# Patient Record
Sex: Female | Born: 1965 | Race: Black or African American | Hispanic: No | Marital: Single | Smoking: Never smoker
Health system: Southern US, Community
[De-identification: ages and names within clinical notes are randomized; demographics above are authoritative.]

## PROBLEM LIST (undated history)

## (undated) DIAGNOSIS — J45909 Unspecified asthma, uncomplicated: Secondary | ICD-10-CM

## (undated) DIAGNOSIS — E785 Hyperlipidemia, unspecified: Secondary | ICD-10-CM

## (undated) DIAGNOSIS — M2550 Pain in unspecified joint: Secondary | ICD-10-CM

## (undated) DIAGNOSIS — F319 Bipolar disorder, unspecified: Secondary | ICD-10-CM

## (undated) DIAGNOSIS — I10 Essential (primary) hypertension: Secondary | ICD-10-CM

## (undated) DIAGNOSIS — R7303 Prediabetes: Secondary | ICD-10-CM

## (undated) DIAGNOSIS — I509 Heart failure, unspecified: Secondary | ICD-10-CM

## (undated) DIAGNOSIS — R600 Localized edema: Secondary | ICD-10-CM

## (undated) DIAGNOSIS — I5032 Chronic diastolic (congestive) heart failure: Secondary | ICD-10-CM

## (undated) DIAGNOSIS — F329 Major depressive disorder, single episode, unspecified: Secondary | ICD-10-CM

## (undated) DIAGNOSIS — M1990 Unspecified osteoarthritis, unspecified site: Secondary | ICD-10-CM

## (undated) DIAGNOSIS — F419 Anxiety disorder, unspecified: Secondary | ICD-10-CM

## (undated) DIAGNOSIS — F32A Depression, unspecified: Secondary | ICD-10-CM

## (undated) HISTORY — DX: Essential (primary) hypertension: I10

## (undated) HISTORY — DX: Unspecified asthma, uncomplicated: J45.909

## (undated) HISTORY — DX: Heart failure, unspecified: I50.9

## (undated) HISTORY — DX: Hyperlipidemia, unspecified: E78.5

## (undated) HISTORY — DX: Bipolar disorder, unspecified: F31.9

## (undated) HISTORY — PX: NO PAST SURGERIES: SHX2092

## (undated) HISTORY — DX: Localized edema: R60.0

## (undated) HISTORY — DX: Morbid (severe) obesity due to excess calories: E66.01

## (undated) HISTORY — DX: Pain in unspecified joint: M25.50

## (undated) HISTORY — DX: Depression, unspecified: F32.A

## (undated) HISTORY — DX: Chronic diastolic (congestive) heart failure: I50.32

## (undated) HISTORY — DX: Anxiety disorder, unspecified: F41.9

---
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
## 2002-02-11 ENCOUNTER — Encounter: Admission: RE | Admit: 2002-02-11 | Discharge: 2002-02-11 | Payer: Self-pay | Admitting: Internal Medicine

## 2002-02-19 ENCOUNTER — Encounter: Admission: RE | Admit: 2002-02-19 | Discharge: 2002-05-20 | Payer: Self-pay | Admitting: *Deleted

## 2003-06-15 ENCOUNTER — Emergency Department (HOSPITAL_COMMUNITY): Admission: EM | Admit: 2003-06-15 | Discharge: 2003-06-15 | Payer: Self-pay | Admitting: Emergency Medicine

## 2004-08-02 ENCOUNTER — Ambulatory Visit: Payer: Self-pay | Admitting: Internal Medicine

## 2004-08-04 ENCOUNTER — Ambulatory Visit: Payer: Self-pay | Admitting: Family Medicine

## 2004-09-21 ENCOUNTER — Ambulatory Visit: Payer: Self-pay | Admitting: Family Medicine

## 2004-10-06 ENCOUNTER — Ambulatory Visit: Payer: Self-pay | Admitting: Family Medicine

## 2004-10-25 ENCOUNTER — Ambulatory Visit: Payer: Self-pay | Admitting: Family Medicine

## 2004-11-03 ENCOUNTER — Ambulatory Visit: Payer: Self-pay | Admitting: Family Medicine

## 2004-11-10 ENCOUNTER — Ambulatory Visit: Payer: Self-pay | Admitting: Family Medicine

## 2004-11-24 ENCOUNTER — Ambulatory Visit: Payer: Self-pay | Admitting: Family Medicine

## 2004-11-30 ENCOUNTER — Ambulatory Visit: Payer: Self-pay | Admitting: Psychiatry

## 2004-11-30 ENCOUNTER — Inpatient Hospital Stay (HOSPITAL_COMMUNITY): Admission: RE | Admit: 2004-11-30 | Discharge: 2004-12-04 | Payer: Self-pay | Admitting: Psychiatry

## 2004-12-08 ENCOUNTER — Ambulatory Visit: Payer: Self-pay | Admitting: Family Medicine

## 2005-02-14 ENCOUNTER — Ambulatory Visit: Payer: Self-pay | Admitting: Family Medicine

## 2005-04-27 ENCOUNTER — Ambulatory Visit: Payer: Self-pay | Admitting: Family Medicine

## 2005-04-27 ENCOUNTER — Ambulatory Visit (HOSPITAL_COMMUNITY): Admission: RE | Admit: 2005-04-27 | Discharge: 2005-04-27 | Payer: Self-pay | Admitting: Family Medicine

## 2005-05-08 ENCOUNTER — Ambulatory Visit: Payer: Self-pay | Admitting: *Deleted

## 2005-05-18 ENCOUNTER — Ambulatory Visit: Payer: Self-pay | Admitting: Family Medicine

## 2005-07-03 ENCOUNTER — Ambulatory Visit: Payer: Self-pay | Admitting: Family Medicine

## 2006-04-21 ENCOUNTER — Emergency Department (HOSPITAL_COMMUNITY): Admission: EM | Admit: 2006-04-21 | Discharge: 2006-04-21 | Payer: Self-pay | Admitting: Emergency Medicine

## 2006-10-25 ENCOUNTER — Emergency Department (HOSPITAL_COMMUNITY): Admission: EM | Admit: 2006-10-25 | Discharge: 2006-10-25 | Payer: Self-pay | Admitting: Family Medicine

## 2006-10-29 ENCOUNTER — Emergency Department (HOSPITAL_COMMUNITY): Admission: EM | Admit: 2006-10-29 | Discharge: 2006-10-29 | Payer: Self-pay | Admitting: Family Medicine

## 2008-03-05 ENCOUNTER — Encounter (INDEPENDENT_AMBULATORY_CARE_PROVIDER_SITE_OTHER): Payer: Self-pay | Admitting: Family Medicine

## 2010-09-05 NOTE — Letter (Signed)
Summary: MAILED REQUESTED ECORDS Christina Dominguez,  MAILED REQUESTED ECORDS [facility],[place]   Imported By: Arta Bruce 03/05/2008 11:25:26  _____________________________________________________________________  External Attachment:    Type:   Image     Comment:   External Document

## 2019-04-30 DIAGNOSIS — J4520 Mild intermittent asthma, uncomplicated: Secondary | ICD-10-CM | POA: Insufficient documentation

## 2019-04-30 DIAGNOSIS — I10 Essential (primary) hypertension: Secondary | ICD-10-CM | POA: Insufficient documentation

## 2019-10-22 ENCOUNTER — Other Ambulatory Visit: Payer: Self-pay | Admitting: Family Medicine

## 2019-10-22 DIAGNOSIS — Z1231 Encounter for screening mammogram for malignant neoplasm of breast: Secondary | ICD-10-CM

## 2019-11-10 ENCOUNTER — Inpatient Hospital Stay: Admission: RE | Admit: 2019-11-10 | Payer: Self-pay | Source: Ambulatory Visit

## 2019-12-02 ENCOUNTER — Other Ambulatory Visit: Payer: Self-pay

## 2019-12-02 ENCOUNTER — Ambulatory Visit
Admission: RE | Admit: 2019-12-02 | Discharge: 2019-12-02 | Disposition: A | Discharge status: Home / Self Care | Payer: Self-pay | Source: Ambulatory Visit | Attending: Family Medicine | Admitting: Family Medicine

## 2019-12-02 DIAGNOSIS — Z1231 Encounter for screening mammogram for malignant neoplasm of breast: Secondary | ICD-10-CM

## 2019-12-04 ENCOUNTER — Other Ambulatory Visit: Payer: Self-pay | Admitting: Family Medicine

## 2019-12-04 DIAGNOSIS — R928 Other abnormal and inconclusive findings on diagnostic imaging of breast: Secondary | ICD-10-CM

## 2019-12-15 ENCOUNTER — Other Ambulatory Visit: Payer: Self-pay

## 2019-12-15 ENCOUNTER — Ambulatory Visit
Admission: RE | Admit: 2019-12-15 | Discharge: 2019-12-15 | Disposition: A | Discharge status: Home / Self Care | Payer: 59 | Source: Ambulatory Visit | Attending: Family Medicine | Admitting: Family Medicine

## 2019-12-15 ENCOUNTER — Other Ambulatory Visit: Payer: Self-pay | Admitting: Family Medicine

## 2019-12-15 DIAGNOSIS — R928 Other abnormal and inconclusive findings on diagnostic imaging of breast: Secondary | ICD-10-CM

## 2019-12-21 ENCOUNTER — Encounter (INDEPENDENT_AMBULATORY_CARE_PROVIDER_SITE_OTHER): Payer: Self-pay | Admitting: Bariatrics

## 2019-12-21 ENCOUNTER — Ambulatory Visit (INDEPENDENT_AMBULATORY_CARE_PROVIDER_SITE_OTHER): Payer: 59 | Admitting: Bariatrics

## 2019-12-21 ENCOUNTER — Other Ambulatory Visit: Payer: Self-pay

## 2019-12-21 VITALS — BP 146/81 | HR 85 | Temp 98.1°F | Ht 61.0 in | Wt 377.0 lb

## 2019-12-21 DIAGNOSIS — Z9189 Other specified personal risk factors, not elsewhere classified: Secondary | ICD-10-CM | POA: Diagnosis not present

## 2019-12-21 DIAGNOSIS — Z1331 Encounter for screening for depression: Secondary | ICD-10-CM

## 2019-12-21 DIAGNOSIS — M2550 Pain in unspecified joint: Secondary | ICD-10-CM

## 2019-12-21 DIAGNOSIS — E66813 Obesity, class 3: Secondary | ICD-10-CM

## 2019-12-21 DIAGNOSIS — I509 Heart failure, unspecified: Secondary | ICD-10-CM

## 2019-12-21 DIAGNOSIS — Z6841 Body Mass Index (BMI) 40.0 and over, adult: Secondary | ICD-10-CM

## 2019-12-21 DIAGNOSIS — J4520 Mild intermittent asthma, uncomplicated: Secondary | ICD-10-CM | POA: Diagnosis not present

## 2019-12-21 DIAGNOSIS — R0602 Shortness of breath: Secondary | ICD-10-CM | POA: Diagnosis not present

## 2019-12-21 DIAGNOSIS — R5383 Other fatigue: Secondary | ICD-10-CM

## 2019-12-21 DIAGNOSIS — I10 Essential (primary) hypertension: Secondary | ICD-10-CM

## 2019-12-21 DIAGNOSIS — F3289 Other specified depressive episodes: Secondary | ICD-10-CM | POA: Diagnosis not present

## 2019-12-21 DIAGNOSIS — Z0289 Encounter for other administrative examinations: Secondary | ICD-10-CM

## 2019-12-21 HISTORY — DX: Body Mass Index (BMI) 40.0 and over, adult: Z684

## 2019-12-21 HISTORY — DX: Morbid (severe) obesity due to excess calories: E66.01

## 2019-12-21 HISTORY — DX: Obesity, class 3: E66.813
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
## 2019-12-21 NOTE — Progress Notes (Signed)
Dear Dr. Crissie Sickles,   Thank you for referring Christina Dominguez to our clinic. The following note includes my evaluation and treatment recommendations.  Chief Complaint:   Christina Dominguez (MR# [id number]) is a 54 y.o. female who presents for evaluation and treatment of obesity and related comorbidities. Current BMI is Body mass index is 71.23 kg/m.Marland Kitchen Christina Dominguez has been struggling with her weight for many years and has been unsuccessful in either losing weight, maintaining weight loss, or reaching her healthy weight goal.  Christina Dominguez is currently in the action stage of change and ready to dedicate time achieving and maintaining a healthier weight. Christina Dominguez is interested in becoming our patient and working on intensive lifestyle modifications including (but not limited to) diet and exercise for weight loss.  Christina Dominguez does like to cook, but notes time and work as obstacles. She craves salty and seafood and more at night. She skips breakfast.  Christina Dominguez's habits were reviewed today and are as follows: she has been heavy most of her life, her heaviest weight ever was 390 pounds, she craves salty and seafood, she wakes up frequently in the middle of the night to eat, she skips breakfast frequently, she has problems with excessive hunger, she frequently eats larger portions than normal, she has binge eating behaviors and she struggles with emotional eating.  Depression Screen Christina Dominguez's Food and Mood (modified PHQ-9) score was 16.  Depression screen [facility] 2/9 12/21/2019  Decreased Interest 3  Down, Depressed, Hopeless 0  PHQ - 2 Score 3  Altered sleeping 3  Tired, decreased energy 3  Change in appetite 3  Feeling bad or failure about yourself  0  Trouble concentrating 1  Moving slowly or fidgety/restless 3  Suicidal thoughts 0  PHQ-9 Score 16  Difficult doing work/chores Somewhat difficult   Subjective:   Other fatigue. Angellynn admits to daytime somnolence and admits to waking up  still tired. Patent has a history of symptoms of daytime fatigue. Miia generally gets 6 hours of sleep per night, and states that she does not sleep well most nights. Snoring is present. Apneic episodes are not present. Epworth Sleepiness Score is 9.  SOB (shortness of breath) on exertion. Christina Dominguez notes increasing shortness of breath with certain activities and seems to be worsening over time with weight gain. She notes getting out of breath sooner with activity than she used to. Shandale denies shortness of breath at rest or orthopnea.  Essential hypertension. Christina Dominguez is taking triamterene. Blood pressure is fairly well controlled.  BP Readings from Last 3 Encounters:  12/21/19 (!) 146/81   No results found for: CREATININE  Mild intermittent asthma without complication. Christina Dominguez uses albuterol PRN as needed.  Other congestive heart failure ([place]). Christina Dominguez does not see a cardiologist. She does not have an echocardiogram on file.   Arthralgia, unspecified joint. Christina Dominguez denies joint replacement. She walks with a cane.  Other depression, with emotional eating. Christina Dominguez is struggling with emotional eating and using food for comfort to the extent that it is negatively impacting her health. She has been working on behavior modification techniques to help reduce her emotional eating and has been somewhat successful. She shows no sign of suicidal or homicidal ideations.  At risk for activity intolerance. Christina Dominguez is at risk of exercise intolerance secondary to knee pain and obesity.  Assessment/Plan:   Other fatigue. Fatigue may be related to obesity, depression or many other causes. Labs will be ordered, and in the meanwhile, Christina Dominguez will focus on self  care including making healthy food choices, increasing physical activity and focusing on stress reduction. EKG 12-Lead, Hemoglobin A1c, Insulin, random, Lipid Panel With LDL/HDL Ratio, VITAMIN D 25 Hydroxy (Vit-D Deficiency, Fractures), T3, T4, free,  TSH testing ordered today.  SOB (shortness of breath) on exertion. Ayahna's shortness of breath appears to be obesity related and exercise induced. She has agreed to work on weight loss and gradually increase exercise to treat her exercise induced shortness of breath. Will continue to monitor closely. Hemoglobin A1c, Insulin, random, Lipid Panel With LDL/HDL Ratio, VITAMIN D 25 Hydroxy (Vit-D Deficiency, Fractures), T3, T4, free, TSH ordered today.  Essential hypertension. Christina Dominguez is working on healthy weight loss and exercise to improve blood pressure control. We will watch for signs of hypotension as she continues her lifestyle modifications. She will continue her medication as directed.  Mild intermittent asthma without complication. Christina Dominguez will continue to use albuterol as needed.  Other congestive heart failure (Christina Dominguez). Christina Dominguez will follow-up with her PCP as directed. Comprehensive metabolic panel ordered today.  Arthralgia, unspecified joint. Christina Dominguez was instructed to avoid pounding exercises and will continue to walk with a cane.  Other depression, with emotional eating. Behavior modification techniques were discussed today to help Christina Dominguez deal with her emotional/non-hunger eating behaviors.  Orders and follow up as documented in patient record. Will refer to Dr. Mallie Mussel, our bariatric psychologist, for evaluation.  At risk for activity intolerance. Christina Dominguez was given approximately 15 minutes of exercise intolerance counseling today. She is 54 y.o. female and has risk factors exercise intolerance including obesity. We discussed intensive lifestyle modifications today with an emphasis on specific weight loss instructions and strategies. Christina Dominguez will slowly increase activity as tolerated.  Repetitive spaced learning was employed today to elicit superior memory formation and behavioral change.  Class 3 severe obesity with serious comorbidity and body mass index (BMI) greater than or equal to 70  in adult, unspecified obesity type ([place]).  Christina Dominguez is currently in the action stage of change and her goal is to continue with weight loss efforts. I recommend Christina Dominguez begin the structured treatment plan as follows:  She has agreed to the Category 4 Plan.  She will work on meal planning and intentional eating.  Exercise goals: All adults should avoid inactivity. Some physical activity is better than none, and adults who participate in any amount of physical activity gain some health benefits.   Behavioral modification strategies: increasing lean protein intake, decreasing simple carbohydrates, increasing vegetables, increasing water intake, decreasing eating out, no skipping meals, meal planning and cooking strategies, keeping healthy foods in the home and planning for success.  She was informed of the importance of frequent follow-up visits to maximize her success with intensive lifestyle modifications for her multiple health conditions. She was informed we would discuss her lab results at her next visit unless there is a critical issue that needs to be addressed sooner. Christina Dominguez agreed to keep her next visit at the agreed upon time to discuss these results.  Objective:   Blood pressure (!) 146/81, pulse 85, temperature 98.1 F (36.7 C), height 5\' 1"  (1.549 m), weight (!) 377 lb (171 kg), SpO2 94 %. Body mass index is 71.23 kg/m.  EKG: Sinus  Rhythm with a rate of 76 BPM. Poor R wave progression may be related to body habitus. Otherwise normal.  Indirect Calorimeter completed today shows a VO2 of 365 and a REE of 2540.  Her calculated basal metabolic rate is [id number] thus her basal metabolic rate is better than  expected.  General: Cooperative, alert, well developed, in no acute distress. HEENT: Conjunctivae and lids unremarkable. Cardiovascular: Regular rhythm.  Lungs: Normal work of breathing. Neurologic: No focal deficits. Uses a cane for ambulation.  No results found for: CREATININE,  BUN, NA, K, CL, CO2 No results found for: ALT, AST, GGT, ALKPHOS, BILITOT No results found for: HGBA1C No results found for: INSULIN No results found for: TSH No results found for: CHOL, HDL, LDLCALC, LDLDIRECT, TRIG, CHOLHDL No results found for: WBC, HGB, HCT, MCV, PLT No results found for: IRON, TIBC, FERRITIN  Attestation Statements:   Reviewed by clinician on day of visit: allergies, medications, problem list, medical history, surgical history, family history, social history, and previous encounter notes.  [name], am acting as Location manager for [employer], DO   I have reviewed the above documentation for accuracy and completeness, and I agree with the above. Jearld Lesch, DO

## 2019-12-22 ENCOUNTER — Encounter (INDEPENDENT_AMBULATORY_CARE_PROVIDER_SITE_OTHER): Payer: Self-pay | Admitting: Bariatrics

## 2019-12-22 DIAGNOSIS — R7303 Prediabetes: Secondary | ICD-10-CM

## 2019-12-22 DIAGNOSIS — E559 Vitamin D deficiency, unspecified: Secondary | ICD-10-CM | POA: Insufficient documentation

## 2019-12-22 HISTORY — DX: Prediabetes: R73.03

## 2019-12-22 LAB — COMPREHENSIVE METABOLIC PANEL
ALT: 14 IU/L (ref 0–32)
AST: 15 IU/L (ref 0–40)
Albumin/Globulin Ratio: 1.2 (ref 1.2–2.2)
Albumin: 4.2 g/dL (ref 3.8–4.9)
Alkaline Phosphatase: 121 IU/L (ref 48–121)
BUN/Creatinine Ratio: 20 (ref 9–23)
BUN: 20 mg/dL (ref 6–24)
Bilirubin Total: 0.4 mg/dL (ref 0.0–1.2)
CO2: 29 mmol/L (ref 20–29)
Calcium: 9.6 mg/dL (ref 8.7–10.2)
Chloride: 94 mmol/L — ABNORMAL LOW (ref 96–106)
Creatinine, Ser: 0.98 mg/dL (ref 0.57–1.00)
GFR calc Af Amer: 76 mL/min/{1.73_m2} (ref >59)
GFR calc non Af Amer: 66 mL/min/{1.73_m2} (ref >59)
Globulin, Total: 3.5 g/dL (ref 1.5–4.5)
Glucose: 83 mg/dL (ref 65–99)
Potassium: 4.2 mmol/L (ref 3.5–5.2)
Sodium: 139 mmol/L (ref 134–144)
Total Protein: 7.7 g/dL (ref 6.0–8.5)

## 2019-12-22 LAB — VITAMIN D 25 HYDROXY (VIT D DEFICIENCY, FRACTURES): Vit D, 25-Hydroxy: 28.6 ng/mL — ABNORMAL LOW (ref 30.0–100.0)

## 2019-12-22 LAB — LIPID PANEL WITH LDL/HDL RATIO
Cholesterol, Total: 158 mg/dL (ref 100–199)
HDL: 58 mg/dL (ref >39)
LDL Chol Calc (NIH): 88 mg/dL (ref 0–99)
LDL/HDL Ratio: 1.5 ratio (ref 0.0–3.2)
Triglycerides: 58 mg/dL (ref 0–149)
VLDL Cholesterol Cal: 12 mg/dL (ref 5–40)

## 2019-12-22 LAB — HEMOGLOBIN A1C
Est. average glucose Bld gHb Est-mCnc: 126 mg/dL
Hgb A1c MFr Bld: 6 % — ABNORMAL HIGH (ref 4.8–5.6)

## 2019-12-22 LAB — T3: T3, Total: 125 ng/dL (ref 71–180)

## 2019-12-22 LAB — TSH: TSH: 2.23 u[IU]/mL (ref 0.450–4.500)

## 2019-12-22 LAB — INSULIN, RANDOM: INSULIN: 9 u[IU]/mL (ref 2.6–24.9)

## 2019-12-22 LAB — T4, FREE: Free T4: 1.25 ng/dL (ref 0.82–1.77)

## 2019-12-29 ENCOUNTER — Telehealth (INDEPENDENT_AMBULATORY_CARE_PROVIDER_SITE_OTHER): Payer: Self-pay | Admitting: Psychology

## 2019-12-29 ENCOUNTER — Telehealth (INDEPENDENT_AMBULATORY_CARE_PROVIDER_SITE_OTHER): Payer: 59 | Admitting: Psychology

## 2019-12-29 ENCOUNTER — Encounter (INDEPENDENT_AMBULATORY_CARE_PROVIDER_SITE_OTHER): Payer: Self-pay | Admitting: Psychology

## 2019-12-29 ENCOUNTER — Other Ambulatory Visit: Payer: Self-pay

## 2019-12-29 DIAGNOSIS — F3189 Other bipolar disorder: Secondary | ICD-10-CM

## 2019-12-29 NOTE — Telephone Encounter (Signed)
Patient requested a "Back to Work Excuse".  One was emailed to patient with the appt date & time.  Patient requested one without the time on it.  Dr. Mallie Mussel provided "Back to Work Excuse" without the appt time.

## 2019-12-29 NOTE — Telephone Encounter (Signed)
  Office: [phone]  /  Fax: [phone]  Date of Call: Dec 29, 2019  Time of Call: 2:56pm Duration of Call: 1 minute Provider: Glennie Isle, PsyD  CONTENT: This provider called Artis Delay as front desk staff Collier Flowers) informed this provider she spoke with [facility] regarding returning the completed authorization to the clinic. Brealynn reportedly informed Ms. Bobette Mo she was requesting a letter for work; therefore, Ms. Bobette Mo sent her the clinic's standard letter with date and time of today's appointment. As such, this provider called Francie to follow-up and Aerith requested a letter from this provider without the time on the letter. Thus, this provider e-mailed her the letter as previously discussed. No evidence of suicidal and homicidal ideation, plan, or intent.   PLAN: No further follow-up planned by this provider.

## 2019-12-29 NOTE — Telephone Encounter (Signed)
  Office: [phone]  /  Fax: [phone]  Date of Call: Dec 29, 2019  Time of Call: 12:21pm Duration of Call: 4 minutes Provider: Glennie Isle, PsyD  CONTENT: This provider called Christina Dominguez as she requested a letter for work indicating she had an appointment today. This provider explained it would be addressed to her; therefore, it would be up to her to manage in terms of confidentiality. She acknowledged understanding. She was receptive to completing an authorization form for this provider to e-mail her the letter as she requested. Christina Dominguez provided verbal consent via telephone for this provider to e-mail her the authorization form to complete.   PLAN: This provider will send the letter to [employer] as requested once the completed authorization form is returned.

## 2020-01-05 ENCOUNTER — Ambulatory Visit (INDEPENDENT_AMBULATORY_CARE_PROVIDER_SITE_OTHER): Payer: 59 | Admitting: Bariatrics

## 2020-01-05 ENCOUNTER — Other Ambulatory Visit (INDEPENDENT_AMBULATORY_CARE_PROVIDER_SITE_OTHER): Payer: Self-pay | Admitting: Bariatrics

## 2020-01-05 ENCOUNTER — Other Ambulatory Visit: Payer: Self-pay

## 2020-01-05 ENCOUNTER — Encounter (INDEPENDENT_AMBULATORY_CARE_PROVIDER_SITE_OTHER): Payer: Self-pay | Admitting: Bariatrics

## 2020-01-05 VITALS — BP 157/78 | HR 75 | Temp 98.5°F | Ht 61.0 in | Wt 374.0 lb

## 2020-01-05 DIAGNOSIS — Z9189 Other specified personal risk factors, not elsewhere classified: Secondary | ICD-10-CM | POA: Diagnosis not present

## 2020-01-05 DIAGNOSIS — E559 Vitamin D deficiency, unspecified: Secondary | ICD-10-CM

## 2020-01-05 DIAGNOSIS — R7303 Prediabetes: Secondary | ICD-10-CM | POA: Diagnosis not present

## 2020-01-05 DIAGNOSIS — Z6841 Body Mass Index (BMI) 40.0 and over, adult: Secondary | ICD-10-CM

## 2020-01-05 MED ORDER — VITAMIN D (ERGOCALCIFEROL) 1.25 MG (50000 UNIT) PO CAPS: 50000.0000 [IU] | ORAL_CAPSULE | ORAL | 0 refills | Status: DC

## 2020-01-05 NOTE — Progress Notes (Signed)
Chief Complaint:   Christina Dominguez is here to discuss her progress with her obesity treatment plan along with follow-up of her obesity related diagnoses. Christina Dominguez is on the Category 4 Plan and states she is following her eating plan approximately 80% of the time. Christina Dominguez states she is exercising 0 minutes 0 times per week.  Today's visit was #: 2 Starting weight: 377 lbs Starting date: 12/21/2019 Today's weight: 374 lbs Today's date: 01/05/2020 Total lbs lost to date: 3 Total lbs lost since last in-office visit: 3  Interim History: Christina Dominguez is down 3 lbs. She is drinking about four 12-16 oz bottles of water daily.  Subjective:   Vitamin D deficiency. Last Vitamin D 28.6 on 12/21/2019.  Prediabetes. [name] has a diagnosis of prediabetes based on her elevated HgA1c and was informed this puts her at greater risk of developing diabetes. She continues to work on diet and exercise to decrease her risk of diabetes. She denies nausea or hypoglycemia. No polyphagia.  Lab Results  Component Value Date   HGBA1C 6.0 (H) 12/21/2019   Lab Results  Component Value Date   INSULIN 9.0 12/21/2019   At risk for hypoglycemia. [name] is at increased risk for hypoglycemia due to prediabetes and obesity.   Assessment/Plan:   Vitamin D deficiency.  Low Vitamin D level contributes to fatigue and are associated with obesity, breast, and colon cancer. She was given a prescription for Vitamin D, Ergocalciferol, (DRISDOL) 1.25 MG (50000 UNIT) CAPS capsule every week #4 with 0 refills and will follow-up for routine testing of Vitamin D, at least 2-3 times per year to avoid over-replacement.   Prediabetes. Christina Dominguez will continue to work on weight loss, exercise, increasing healthy fats and protein, and decreasing simple carbohydrates to help decrease the risk of diabetes. Handout was given for Insulin Resistance and Prediabetes.  At risk for hypoglycemia. Christina Dominguez was given approximately 15  minutes of counseling today regarding prevention of hypoglycemia. She was advised of symptoms of hypoglycemia. Christina Dominguez was instructed to avoid skipping meals, eat regular protein rich meals and schedule low calorie snacks as needed.   Repetitive spaced learning was employed today to elicit superior memory formation and behavioral change.  Class 3 severe obesity with serious comorbidity and body mass index (BMI) greater than or equal to 70 in adult, unspecified obesity type ([place]).  Christina Dominguez is currently in the action stage of change. As such, her goal is to continue with weight loss efforts. She has agreed to the Category 4 Plan.   She will work on meal planning.  We reviewed with the patient labs from 12/21/2019 including CMP, lipids, Vitamin D, A1c, insulin, and thyroid panel.  Exercise goals: All adults should avoid inactivity. Some physical activity is better than none, and adults who participate in any amount of physical activity gain some health benefits.  Behavioral modification strategies: increasing lean protein intake, decreasing simple carbohydrates, increasing vegetables, increasing water intake, decreasing eating out, no skipping meals, meal planning and cooking strategies, keeping healthy foods in the home and planning for success.  Christina Dominguez has agreed to follow-up with our clinic in 2 weeks. She was informed of the importance of frequent follow-up visits to maximize her success with intensive lifestyle modifications for her multiple health conditions.   Objective:   Blood pressure (!) 157/78, pulse 75, temperature 98.5 F (36.9 C), height 5\' 1"  (1.549 m), weight (!) 374 lb (169.6 kg), SpO2 95 %. Body mass index is 70.67 kg/m.  General: Cooperative,  alert, well developed, in no acute distress. HEENT: Conjunctivae and lids unremarkable. Cardiovascular: Regular rhythm.  Lungs: Normal work of breathing. Neurologic: No focal deficits. Pt walking with a cane.  Lab Results    Component Value Date   CREATININE 0.98 12/21/2019   BUN 20 12/21/2019   NA 139 12/21/2019   K 4.2 12/21/2019   CL 94 (L) 12/21/2019   CO2 29 12/21/2019   Lab Results  Component Value Date   ALT 14 12/21/2019   AST 15 12/21/2019   ALKPHOS 121 12/21/2019   BILITOT 0.4 12/21/2019   Lab Results  Component Value Date   HGBA1C 6.0 (H) 12/21/2019   Lab Results  Component Value Date   INSULIN 9.0 12/21/2019   Lab Results  Component Value Date   TSH 2.230 12/21/2019   Lab Results  Component Value Date   CHOL 158 12/21/2019   HDL 58 12/21/2019   LDLCALC 88 12/21/2019   TRIG 58 12/21/2019   No results found for: WBC, HGB, HCT, MCV, PLT No results found for: IRON, TIBC, FERRITIN  Attestation Statements:   Reviewed by clinician on day of visit: allergies, medications, problem list, medical history, surgical history, family history, social history, and previous encounter notes.  [name], am acting as Location manager for [employer], DO   I have reviewed the above documentation for accuracy and completeness, and I agree with the above. Jearld Lesch, DO

## 2020-01-05 NOTE — Progress Notes (Signed)
Office: [phone]  /  Fax: [phone]    Date: January 18, 2020   Appointment Start Time: 4:37pm Duration: 27 minutes Provider: Glennie Isle, Psy.D. Type of Session: Individual Therapy  Location of Patient: Home Location of Provider: Provider's Home Type of Contact: Telepsychological Visit via MyChart Video Visit  Session Content: This provider called Mirai at 4:32pm as she did not present for the telepsychological appointment. She noted she thought the appointment was tomorrow, but stated she can join. As such, today's appointment was initiated 7 minutes late. Elliauna is a 54 y.o. female presenting via [place] Visit for a follow-up appointment to address the previously established treatment goal of increasing coping skills. Today's appointment was a telepsychological visit due to COVID-19. Blanch provided verbal consent for today's telepsychological appointment and she is aware she is responsible for securing confidentiality on her end of the session. Prior to proceeding with today's appointment, Halia's physical location at the time of this appointment was obtained as well a phone number she could be reached at in the event of technical difficulties. Ashwaq and this provider participated in today's telepsychological service.   This provider conducted a brief check-in. Laklyn shared about recent stressors. She indicated she has been coping by consuming alcohol as she feels she does not have any other coping skills to assist with recent stressors. This was explored further. Carson indicated she was consuming 2-3 large cocktails nightly the last two nights over the course of 3-4 hours. She acknowledged she missed [place] due to her drinking on Sunday. As such, psychoeducation regarding the importance of self-care utilizing the oxygen mask metaphor was provided. Additionally, psychoeducation regarding pleasurable activities, including its impact on emotional eating and overall  well-being was provided. Yenni was provided with a handout with various options of pleasurable activities, and was encouraged to engage in one activity a day and additional activities as needed when triggered to emotionally eat. Holiday agreed. Yessica provided verbal consent during today's appointment for this provider to send a handout with pleasurable activities via e-mail. She noted a plan to play Solitaire, speak to her nephew, and call her friend tonight, noting, "I think that will be helpful." Aleine was receptive to today's appointment as evidenced by openness to sharing, responsiveness to feedback, and willingness to engage in pleasurable activities to assist with coping.  Mental Status Examination:  Appearance: well groomed and appropriate hygiene  Behavior: appropriate to circumstances Mood: euthymic Affect: mood congruent Speech: normal in rate, volume, and tone Eye Contact: appropriate Psychomotor Activity: appropriate Gait: unable to assess Thought Process: linear, logical, and goal directed  Thought Content/Perception: denies suicidal and homicidal ideation, plan, and intent and no hallucinations, delusions, bizarre thinking or behavior reported or observed Orientation: time, person, place, and purpose of appointment Memory/Concentration: memory, attention, language, and fund of knowledge intact  Insight/Judgment: good  Interventions:  Conducted a brief chart review Provided empathic reflections and validation Employed supportive psychotherapy interventions to facilitate reduced distress and to improve coping skills with identified stressors Employed motivational interviewing skills to assess patient's willingness/desire to adhere to recommended medical treatments and assignments Psychoeducation provided regarding pleasurable activities Psychoeducation provided regarding self-care  DSM-5 Diagnosis(es): 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 Progress is limited, as Penel[place] has just begun treatment with this provider; however, she is receptive to the interaction and interventions and rapport  is being established.   Plan: The next appointment will be scheduled in approximately two weeks, which will be via MyChart Video Visit. The next session will focus on working towards the established treatment goal. Of note, Sentoria requested a letter for work for today's appointment. Limits of confidentiality once the letter is sent via e-mail were reviewed and she was receptive to signing an authorization for this provider to e-mail the letter to her.

## 2020-01-18 ENCOUNTER — Telehealth (INDEPENDENT_AMBULATORY_CARE_PROVIDER_SITE_OTHER): Payer: 59 | Admitting: Psychology

## 2020-01-18 ENCOUNTER — Other Ambulatory Visit: Payer: Self-pay

## 2020-01-18 DIAGNOSIS — F3189 Other bipolar disorder: Secondary | ICD-10-CM

## 2020-01-20 ENCOUNTER — Telehealth (INDEPENDENT_AMBULATORY_CARE_PROVIDER_SITE_OTHER): Payer: Self-pay | Admitting: Psychology

## 2020-01-20 NOTE — Telephone Encounter (Addendum)
Christina Dominguez signed and returned the authorization via e-mail for a letter indicating she had an appointment with this provider on January 18, 2020 for work purposes. The letter was sent to her via e-mail as discussed previously.

## 2020-01-20 NOTE — Progress Notes (Signed)
Office: [phone]  /  Fax: [phone]    Date: February 03, 2020   Appointment Start Time: 4:27pm Duration: 28 minutes Provider: Glennie Isle, Psy.D. Type of Session: Individual Therapy  Location of Patient: Home Location of Provider: Provider's Home Type of Contact: Telepsychological Visit via MyChart Video Visit  Session Content: Christina Dominguez is a 54 y.o. female presenting via MyChart Video Visit for a follow-up appointment to address the previously established treatment goal of increasing coping skills. Today's appointment was a telepsychological visit due to COVID-19. Christina Dominguez provided verbal consent for today's telepsychological appointment and she is aware she is responsible for securing confidentiality on her end of the session. Prior to proceeding with today's appointment, Christina Dominguez's physical location at the time of this appointment was obtained as well a phone number she could be reached at in the event of technical difficulties. Christina Dominguez and this provider participated in today's telepsychological service.   This provider conducted a brief check-in. Christina Dominguez shared about recent events, including spending time with family and friends. She added, "It was nice." Christina Dominguez also discussed an increase in physical activity, chores are completed, and the apartment is clean. Christina Dominguez shared about her appointment with her new clinician, noting their next appointment is in two weeks. Regarding eating, Christina Dominguez discussed a reduction in emotional eating and alcohol use, adding she engaged in pleasurable activities to assist with coping. Psychoeducation regarding triggers for emotional eating was provided to further assist with coping. Christina Dominguez was provided a handout, and encouraged to utilize the handout between now and the next appointment to increase awareness of triggers and frequency. Christina Dominguez agreed. This provider also discussed behavioral strategies for specific triggers, such as placing the utensil down when  conversing to avoid mindless eating. Christina Dominguez provided verbal consent during today's appointment for this provider to send a handout about triggers via e-mail. Christina Dominguez was receptive to today's appointment as evidenced by openness to sharing, responsiveness to feedback, and willingness to explore triggers for emotional eating.  Mental Status Examination:  Appearance: well groomed and appropriate hygiene  Behavior: appropriate to circumstances Mood: euthymic Affect: mood congruent Speech: normal in rate, volume, and tone Eye Contact: appropriate Psychomotor Activity: appropriate Gait: unable to assess Thought Process: linear, logical, and goal directed  Thought Content/Perception: no hallucinations, delusions, bizarre thinking or behavior reported or observed and no evidence of suicidal and homicidal ideation, plan, and intent Orientation: time, person, place, and purpose of appointment Memory/Concentration: memory, attention, language, and fund of knowledge intact  Insight/Judgment: good  Interventions:  Conducted a brief chart review Provided empathic reflections and validation Employed supportive psychotherapy interventions to facilitate reduced distress and to improve coping skills with identified stressors Psychoeducation provided regarding triggers for emotional eating  DSM-5 Diagnosis(es): 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 Christina Dominguez has demonstrated progress in her goal as evidenced by increased awareness of hunger patterns and increased awareness of triggers for emotional eating. Christina Dominguez also demonstrates willingness to engage in pleasurable activities.  Plan: Today was Christina Dominguez's last appointment with this provider as she established care with a new provider with [facility]. She acknowledged understanding that she may request a follow-up appointment with this provider in the future as long as she is still established with the clinic. No further follow-up planned by this provider. Additionally, Christina Dominguez requested a letter again for work to be sent via e-mail.

## 2020-01-26 ENCOUNTER — Encounter (INDEPENDENT_AMBULATORY_CARE_PROVIDER_SITE_OTHER): Payer: Self-pay | Admitting: Bariatrics

## 2020-01-26 ENCOUNTER — Ambulatory Visit (INDEPENDENT_AMBULATORY_CARE_PROVIDER_SITE_OTHER): Payer: 59 | Admitting: Bariatrics

## 2020-01-26 ENCOUNTER — Ambulatory Visit (INDEPENDENT_AMBULATORY_CARE_PROVIDER_SITE_OTHER): Payer: 59 | Admitting: Psychology

## 2020-01-26 ENCOUNTER — Other Ambulatory Visit: Payer: Self-pay

## 2020-01-26 VITALS — BP 159/76 | HR 76 | Temp 98.1°F | Ht 60.0 in | Wt 375.0 lb

## 2020-01-26 DIAGNOSIS — F319 Bipolar disorder, unspecified: Secondary | ICD-10-CM | POA: Diagnosis not present

## 2020-01-26 DIAGNOSIS — E559 Vitamin D deficiency, unspecified: Secondary | ICD-10-CM | POA: Diagnosis not present

## 2020-01-26 DIAGNOSIS — I10 Essential (primary) hypertension: Secondary | ICD-10-CM | POA: Diagnosis not present

## 2020-01-26 DIAGNOSIS — Z9189 Other specified personal risk factors, not elsewhere classified: Secondary | ICD-10-CM | POA: Diagnosis not present

## 2020-01-26 DIAGNOSIS — Z6841 Body Mass Index (BMI) 40.0 and over, adult: Secondary | ICD-10-CM

## 2020-01-26 MED ORDER — VITAMIN D (ERGOCALCIFEROL) 1.25 MG (50000 UNIT) PO CAPS: 50000.0000 [IU] | ORAL_CAPSULE | ORAL | 0 refills | Status: DC

## 2020-01-27 NOTE — Progress Notes (Signed)
Chief Complaint:   Christina Dominguez is here to discuss her progress with her obesity treatment plan along with follow-up of her obesity related diagnoses. Christina Dominguez is on the Category 4 Plan and states she is following her eating plan approximately 80% of the time. Christina Dominguez states she is doing arm weights 10 minutes 3 times per week.  Today's visit was #: 3 Starting weight: 377 lbs Starting date: 12/21/2019 Today's weight: 375 lbs Today's date: 01/26/2020 Total lbs lost to date: 2 Total lbs lost since last in-office visit: 0  Interim History: Christina Dominguez is up 1 lb. She has been fatigued, but is sleeping better. She reports having fewer cravings.  Subjective:   Vitamin D deficiency. No nausea, vomiting, or muscle weakness. Last Vitamin D was 28.6 on 12/21/2019.  Essential hypertension. Christina Dominguez is taking Maxzide. Blood pressure is elevated today at 159/76.  BP Readings from Last 3 Encounters:  01/26/20 (!) 159/76  01/05/20 (!) 157/78  12/21/19 (!) 146/81   Lab Results  Component Value Date   CREATININE 0.98 12/21/2019   At risk for osteoporosis. Christina Dominguez is at higher risk of osteopenia and osteoporosis due to Vitamin D deficiency.   Assessment/Plan:   Vitamin D deficiency. Low Vitamin D level contributes to fatigue and are associated with obesity, breast, and colon cancer. She was given a prescription for Vitamin D, Ergocalciferol, (DRISDOL) 1.25 MG (50000 UNIT) CAPS capsule every week #4 with 0 refills and will follow-up for routine testing of Vitamin D, at least 2-3 times per year to avoid over-replacement.   Essential hypertension. [name] is working on healthy weight loss and exercise to improve blood pressure control. We will watch for signs of hypotension as she continues her lifestyle modifications. She will continue her medication as directed and continue with weight loss.  At risk for osteoporosis. Christina Dominguez was given approximately 15 minutes of osteoporosis  prevention counseling today. Christina Dominguez is at risk for osteopenia and osteoporosis due to her Vitamin D deficiency. She was encouraged to take her Vitamin D and follow her higher calcium diet and increase strengthening exercise to help strengthen her bones and decrease her risk of osteopenia and osteoporosis.  Repetitive spaced learning was employed today to elicit superior memory formation and behavioral change.  Class 3 severe obesity with serious comorbidity and body mass index (BMI) greater than or equal to 70 in adult, unspecified obesity type ([place]).  Christina Dominguez is currently in the action stage of change. As such, her goal is to continue with weight loss efforts. She has agreed to the Category 4 Plan.   She will work on meal planning, intentional eating, and increasing her water intake.  Exercise goals: All adults should avoid inactivity. Some physical activity is better than none, and adults who participate in any amount of physical activity gain some health benefits.  Behavioral modification strategies: increasing lean protein intake, decreasing simple carbohydrates, increasing vegetables, increasing water intake, decreasing eating out, no skipping meals, meal planning and cooking strategies, keeping healthy foods in the home, better snacking choices and planning for success.  Christina Dominguez has agreed to follow-up with our clinic in 2 weeks. She was informed of the importance of frequent follow-up visits to maximize her success with intensive lifestyle modifications for her multiple health conditions.   Objective:   Blood pressure (!) 159/76, pulse 76, temperature 98.1 F (36.7 C), height 5' (1.524 m), weight (!) 375 lb (170.1 kg), SpO2 96 %. Body mass index is 73.24 kg/m.  General: Cooperative, alert,  well developed, in no acute distress. HEENT: Conjunctivae and lids unremarkable. Cardiovascular: Regular rhythm.  Lungs: Normal work of breathing. Neurologic: No focal deficits.  Extremities: Pt  uses a cane for ambulation. She has ankle swelling left greater than right.  Lab Results  Component Value Date   CREATININE 0.98 12/21/2019   BUN 20 12/21/2019   NA 139 12/21/2019   K 4.2 12/21/2019   CL 94 (L) 12/21/2019   CO2 29 12/21/2019   Lab Results  Component Value Date   ALT 14 12/21/2019   AST 15 12/21/2019   ALKPHOS 121 12/21/2019   BILITOT 0.4 12/21/2019   Lab Results  Component Value Date   HGBA1C 6.0 (H) 12/21/2019   Lab Results  Component Value Date   INSULIN 9.0 12/21/2019   Lab Results  Component Value Date   TSH 2.230 12/21/2019   Lab Results  Component Value Date   CHOL 158 12/21/2019   HDL 58 12/21/2019   LDLCALC 88 12/21/2019   TRIG 58 12/21/2019   No results found for: WBC, HGB, HCT, MCV, PLT No results found for: IRON, TIBC, FERRITIN  Attestation Statements:   Reviewed by clinician on day of visit: allergies, medications, problem list, medical history, surgical history, family history, social history, and previous encounter notes.  [name], am acting as Location manager for [employer], DO   I have reviewed the above documentation for accuracy and completeness, and I agree with the above. Jearld Lesch, DO

## 2020-01-28 ENCOUNTER — Other Ambulatory Visit (INDEPENDENT_AMBULATORY_CARE_PROVIDER_SITE_OTHER): Payer: Self-pay | Admitting: Bariatrics

## 2020-01-28 DIAGNOSIS — E559 Vitamin D deficiency, unspecified: Secondary | ICD-10-CM

## 2020-02-03 ENCOUNTER — Other Ambulatory Visit: Payer: Self-pay

## 2020-02-03 ENCOUNTER — Telehealth (INDEPENDENT_AMBULATORY_CARE_PROVIDER_SITE_OTHER): Payer: 59 | Admitting: Psychology

## 2020-02-03 DIAGNOSIS — F3189 Other bipolar disorder: Secondary | ICD-10-CM

## 2020-02-16 ENCOUNTER — Encounter (INDEPENDENT_AMBULATORY_CARE_PROVIDER_SITE_OTHER): Payer: Self-pay | Admitting: Adult Health

## 2020-02-16 ENCOUNTER — Other Ambulatory Visit (INDEPENDENT_AMBULATORY_CARE_PROVIDER_SITE_OTHER): Payer: Self-pay | Admitting: Adult Health

## 2020-02-16 ENCOUNTER — Ambulatory Visit (INDEPENDENT_AMBULATORY_CARE_PROVIDER_SITE_OTHER): Payer: 59 | Admitting: Bariatrics

## 2020-02-16 ENCOUNTER — Other Ambulatory Visit: Payer: Self-pay

## 2020-02-16 ENCOUNTER — Ambulatory Visit (INDEPENDENT_AMBULATORY_CARE_PROVIDER_SITE_OTHER): Payer: 59 | Admitting: Adult Health

## 2020-02-16 VITALS — BP 151/85 | HR 81 | Temp 98.5°F | Ht 60.0 in | Wt 371.0 lb

## 2020-02-16 DIAGNOSIS — Z9189 Other specified personal risk factors, not elsewhere classified: Secondary | ICD-10-CM | POA: Diagnosis not present

## 2020-02-16 DIAGNOSIS — I10 Essential (primary) hypertension: Secondary | ICD-10-CM

## 2020-02-16 DIAGNOSIS — Z6841 Body Mass Index (BMI) 40.0 and over, adult: Secondary | ICD-10-CM

## 2020-02-16 DIAGNOSIS — E66813 Obesity, class 3: Secondary | ICD-10-CM

## 2020-02-16 DIAGNOSIS — R7303 Prediabetes: Secondary | ICD-10-CM | POA: Diagnosis not present

## 2020-02-16 DIAGNOSIS — E559 Vitamin D deficiency, unspecified: Secondary | ICD-10-CM | POA: Diagnosis not present

## 2020-02-16 MED ORDER — VITAMIN D (ERGOCALCIFEROL) 1.25 MG (50000 UNIT) PO CAPS: 50000.0000 [IU] | ORAL_CAPSULE | ORAL | 0 refills | Status: DC

## 2020-02-16 MED ORDER — AMLODIPINE BESYLATE 2.5 MG PO TABS: 2.5000 mg | ORAL_TABLET | Freq: Every day | ORAL | 0 refills | Status: DC

## 2020-02-17 NOTE — Progress Notes (Signed)
Chief Complaint:   Christina Dominguez is here to discuss her progress with her obesity treatment plan along with follow-up of her obesity related diagnoses. Christina Dominguez is on the Category 4 Plan and states she is following her eating plan approximately 90% of the time. Christina Dominguez states she is walking/weights 20 minutes 3 times per week.  Today's visit was #: 4 Starting weight: 377 lbs Starting date: 12/21/2019 Today's weight: 371 lbs Today's date: 02/16/2020 Total lbs lost to date: 6 Total lbs lost since last in-office visit: 4  Interim History: Christina Dominguez continues to enjoy the Category 4 meal plan and denies excessive cravings or polyphagia. Since starting the program, she reports increased energy, reduction in general aching, overall improvement in mood, and a decrease in her shortness of breath on exertion.  Subjective:   Vitamin D deficiency. Christina Dominguez is on prescription strength Vitamin D supplementation and denies nausea, vomiting, or muscle weakness.    Ref. Range 12/21/2019 13:50  Vitamin D, 25-Hydroxy Latest Ref Range: 30.0 - 100.0 ng/mL 28.6 (L)   Prediabetes. Christina Dominguez has a diagnosis of prediabetes based on her elevated HgA1c and was informed this puts her at greater risk of developing diabetes. She continues to work on diet and exercise to decrease her risk of diabetes. She denies nausea or hypoglycemia. Christina Dominguez is not on metformin and denies polyphagia.  Lab Results  Component Value Date   HGBA1C 6.0 (H) 12/21/2019   Lab Results  Component Value Date   INSULIN 9.0 12/21/2019   Essential hypertension. Blood pressure is elevated today and has been at her last three office visits. She is on the maximum dose of Maxzide at 75/50 mg day. CMP on 12/21/2019 was stable. She denies cardiac symptoms and reports a decrease in her shortness of breath on exertion. She denies tobacco/vape use.  Shew denies lower ext edema.  BP Readings from Last 3 Encounters:  02/16/20 (!)  151/85  01/26/20 (!) 159/76  01/05/20 (!) 157/78   Lab Results  Component Value Date   CREATININE 0.98 12/21/2019   At risk for heart disease. Christina Dominguez is at a higher than average risk for cardiovascular disease due to uncontrolled blood pressure, prediabetes, and obesity.   Assessment/Plan:   Vitamin D deficiency. Low Vitamin D level contributes to fatigue and are associated with obesity, breast, and colon cancer. She was given a refill on her Vitamin D, Ergocalciferol, (DRISDOL) 1.25 MG (50000 UNIT) CAPS capsule every week #4 with 0 refills and will follow-up for routine testing of Vitamin D every 3 months.   Prediabetes. Christina Dominguez will continue to work on weight loss, exercise, and decreasing simple carbohydrates to help decrease the risk of diabetes. She will continue to follow the Category 4 meal plan and will have labs checked every 3 months.  Essential hypertension. Christina Dominguez is working on healthy weight loss and exercise to improve blood pressure control. We will watch for signs of hypotension as she continues her lifestyle modifications. Christina Dominguez will start amLODipine (NORVASC) 2.5 MG tablet once daily #30 with 0 refills and will continue Maxzide as directed. She will check her blood pressure and heart rate at home and will bring the log to her next follow-up appointment.  At risk for heart disease. Christina Dominguez was given approximately 15 minutes of coronary artery disease prevention counseling today. She is 54 y.o. female and has risk factors for heart disease including obesity. We discussed intensive lifestyle modifications today with an emphasis on specific weight loss instructions and strategies.  Repetitive spaced learning was employed today to elicit superior memory formation and behavioral change.  Class 3 severe obesity with serious comorbidity and body mass index (BMI) greater than or equal to 70 in adult, unspecified obesity type ([place]).  Christina Dominguez is currently in the action stage of  change. As such, her goal is to continue with weight loss efforts. She has agreed to the Category 4 Plan.  Handouts were provided on [employer] and Additional Breakfast Options.  Exercise goals: Christina Dominguez will continue her current exercise regimen.   Behavioral modification strategies: increasing lean protein intake, no skipping meals, meal planning and cooking strategies and planning for success.  Christina Dominguez has agreed to follow-up with our clinic in 2 weeks. She was informed of the importance of frequent follow-up visits to maximize her success with intensive lifestyle modifications for her multiple health conditions.   Objective:   Blood pressure (!) 151/85, pulse 81, temperature 98.5 F (36.9 C), height 5' (1.524 m), weight (!) 371 lb (168.3 kg), SpO2 94 %. Body mass index is 72.46 kg/m.  General: Cooperative, alert, well developed, in no acute distress. HEENT: Conjunctivae and lids unremarkable. Cardiovascular: Regular rhythm.  Lungs: Normal work of breathing. Neurologic: No focal deficits.   Lab Results  Component Value Date   CREATININE 0.98 12/21/2019   BUN 20 12/21/2019   NA 139 12/21/2019   K 4.2 12/21/2019   CL 94 (L) 12/21/2019   CO2 29 12/21/2019   Lab Results  Component Value Date   ALT 14 12/21/2019   AST 15 12/21/2019   ALKPHOS 121 12/21/2019   BILITOT 0.4 12/21/2019   Lab Results  Component Value Date   HGBA1C 6.0 (H) 12/21/2019   Lab Results  Component Value Date   INSULIN 9.0 12/21/2019   Lab Results  Component Value Date   TSH 2.230 12/21/2019   Lab Results  Component Value Date   CHOL 158 12/21/2019   HDL 58 12/21/2019   LDLCALC 88 12/21/2019   TRIG 58 12/21/2019   No results found for: WBC, HGB, HCT, MCV, PLT No results found for: IRON, TIBC, FERRITIN  Attestation Statements:   Reviewed by clinician on day of visit: allergies, medications, problem list, medical history, surgical history, family history, social history, and previous  encounter notes.  I, Michaelene Song, am acting as Location manager for [employer], NP-C   I have reviewed the above documentation for accuracy and completeness, and I agree with the above. -  Esaw Grandchild, NP

## 2020-02-23 ENCOUNTER — Ambulatory Visit: Payer: 59 | Admitting: Psychology

## 2020-02-24 ENCOUNTER — Other Ambulatory Visit (INDEPENDENT_AMBULATORY_CARE_PROVIDER_SITE_OTHER): Payer: Self-pay | Admitting: Adult Health

## 2020-02-24 DIAGNOSIS — E559 Vitamin D deficiency, unspecified: Secondary | ICD-10-CM

## 2020-03-03 ENCOUNTER — Ambulatory Visit (INDEPENDENT_AMBULATORY_CARE_PROVIDER_SITE_OTHER): Payer: 59 | Admitting: Bariatrics

## 2020-03-03 ENCOUNTER — Encounter (INDEPENDENT_AMBULATORY_CARE_PROVIDER_SITE_OTHER): Payer: Self-pay | Admitting: Bariatrics

## 2020-03-03 ENCOUNTER — Other Ambulatory Visit: Payer: Self-pay

## 2020-03-03 VITALS — BP 139/64 | HR 93 | Temp 98.2°F | Ht 60.0 in | Wt 371.0 lb

## 2020-03-03 DIAGNOSIS — R7303 Prediabetes: Secondary | ICD-10-CM

## 2020-03-03 DIAGNOSIS — Z9189 Other specified personal risk factors, not elsewhere classified: Secondary | ICD-10-CM

## 2020-03-03 DIAGNOSIS — I10 Essential (primary) hypertension: Secondary | ICD-10-CM

## 2020-03-03 DIAGNOSIS — E559 Vitamin D deficiency, unspecified: Secondary | ICD-10-CM

## 2020-03-03 DIAGNOSIS — Z6841 Body Mass Index (BMI) 40.0 and over, adult: Secondary | ICD-10-CM

## 2020-03-03 MED ORDER — VITAMIN D (ERGOCALCIFEROL) 1.25 MG (50000 UNIT) PO CAPS: 50000.0000 [IU] | ORAL_CAPSULE | ORAL | 0 refills | Status: DC

## 2020-03-03 NOTE — Progress Notes (Signed)
Chief Complaint:   [place] is here to discuss her progress with her obesity treatment plan along with follow-up of her obesity related diagnoses. Marin is on the Category 4 Plan and states she is following her eating plan approximately 80% of the time. Deaja states she is exercising 0 minutes 0 times per week.  Today's visit was #: 5 Starting weight: 377 lbs Starting date: 12/21/2019 Today's weight: 371 lbs Today's date: 03/03/2020 Total lbs lost to date: 6 Total lbs lost since last in-office visit: 0  Interim History: Catharine's weight remains the same since her last visit. She is getting slightly discouraged as she is not eating enough.  Subjective:   Vitamin D deficiency. No nausea, vomiting, or muscle weakness.    Ref. Range 12/21/2019 13:50  Vitamin D, 25-Hydroxy Latest Ref Range: 30.0 - 100.0 ng/mL 28.6 (L)   Essential hypertension. Daielle is taking Maxzide. Blood pressure is controlled.  BP Readings from Last 3 Encounters:  03/03/20 (!) 139/64  02/16/20 (!) 151/85  01/26/20 (!) 159/76   Lab Results  Component Value Date   CREATININE 0.98 12/21/2019   Prediabetes. Cresencia has a diagnosis of prediabetes based on her elevated HgA1c and was informed this puts her at greater risk of developing diabetes. She continues to work on diet and exercise to decrease her risk of diabetes. She denies nausea or hypoglycemia. Anika is on no medication.  Lab Results  Component Value Date   HGBA1C 6.0 (H) 12/21/2019   Lab Results  Component Value Date   INSULIN 9.0 12/21/2019   At risk of diabetes mellitus. Ghazal is at higher than average risk for developing diabetes due to prediabetes.  Assessment/Plan:   Vitamin D deficiency. Low Vitamin D level contributes to fatigue and are associated with obesity, breast, and colon cancer. She was given a prescription for Vitamin D, Ergocalciferol, (DRISDOL) 1.25 MG (50000 UNIT) CAPS capsule every week #4  with 0 refills and will follow-up for routine testing of Vitamin D, at least 2-3 times per year to avoid over-replacement.   Essential hypertension. Evonna is working on healthy weight loss and exercise to improve blood pressure control. We will watch for signs of hypotension as she continues her lifestyle modifications. She will continue her medication as directed.   Prediabetes. [place] will continue to work on weight loss, exercise, increasing healthy fats and protein, and decreasing simple carbohydrates to help decrease the risk of diabetes.   At risk of diabetes mellitus. Oneda was given approximately 15 minutes of diabetes education and counseling today. We discussed intensive lifestyle modifications today with an emphasis on weight loss as well as increasing exercise and decreasing simple carbohydrates in her diet. We also reviewed medication options with an emphasis on risk versus benefit of those discussed.   Repetitive spaced learning was employed today to elicit superior memory formation and behavioral change.  Class 3 severe obesity with serious comorbidity and body mass index (BMI) greater than or equal to 70 in adult, unspecified obesity type ([place]).  Manpreet is currently in the action stage of change. As such, her goal is to continue with weight loss efforts. She has agreed to the Category 4 Plan.   She will work on meal planning, intentional eating, and increasing her water intake, and ensuring she gets adequate protein.    Exercise goals: All adults should avoid inactivity. Some physical activity is better than none, and adults who participate in any amount of physical activity gain some health  benefits.  Behavioral modification strategies: increasing lean protein intake, decreasing simple carbohydrates, increasing vegetables, increasing water intake, decreasing eating out, no skipping meals, meal planning and cooking strategies, keeping healthy foods in the home, avoiding  temptations and planning for success.  Avanthika has agreed to follow-up with our clinic in 2-3 weeks. She was informed of the importance of frequent follow-up visits to maximize her success with intensive lifestyle modifications for her multiple health conditions.   Objective:   Blood pressure (!) 139/64, pulse 93, temperature 98.2 F (36.8 C), height 5' (1.524 m), weight (!) 371 lb (168.3 kg), SpO2 96 %. Body mass index is 72.46 kg/m.  General: Cooperative, alert, well developed, in no acute distress. HEENT: Conjunctivae and lids unremarkable. Cardiovascular: Regular rhythm.  Lungs: Normal work of breathing. Neurologic: No focal deficits.   Lab Results  Component Value Date   CREATININE 0.98 12/21/2019   BUN 20 12/21/2019   NA 139 12/21/2019   K 4.2 12/21/2019   CL 94 (L) 12/21/2019   CO2 29 12/21/2019   Lab Results  Component Value Date   ALT 14 12/21/2019   AST 15 12/21/2019   ALKPHOS 121 12/21/2019   BILITOT 0.4 12/21/2019   Lab Results  Component Value Date   HGBA1C 6.0 (H) 12/21/2019   Lab Results  Component Value Date   INSULIN 9.0 12/21/2019   Lab Results  Component Value Date   TSH 2.230 12/21/2019   Lab Results  Component Value Date   CHOL 158 12/21/2019   HDL 58 12/21/2019   LDLCALC 88 12/21/2019   TRIG 58 12/21/2019   No results found for: WBC, HGB, HCT, MCV, PLT No results found for: IRON, TIBC, FERRITIN  Attestation Statements:   Reviewed by clinician on day of visit: allergies, medications, problem list, medical history, surgical history, family history, social history, and previous encounter notes.  [name], am acting as Location manager for [employer], DO   I have reviewed the above documentation for accuracy and completeness, and I agree with the above. Jearld Lesch, DO

## 2020-03-05 ENCOUNTER — Other Ambulatory Visit (INDEPENDENT_AMBULATORY_CARE_PROVIDER_SITE_OTHER): Payer: Self-pay | Admitting: Bariatrics

## 2020-03-05 DIAGNOSIS — E559 Vitamin D deficiency, unspecified: Secondary | ICD-10-CM

## 2020-03-09 ENCOUNTER — Ambulatory Visit (INDEPENDENT_AMBULATORY_CARE_PROVIDER_SITE_OTHER): Payer: 59 | Admitting: Psychology

## 2020-03-09 DIAGNOSIS — F3189 Other bipolar disorder: Secondary | ICD-10-CM | POA: Diagnosis not present

## 2020-03-17 ENCOUNTER — Ambulatory Visit (INDEPENDENT_AMBULATORY_CARE_PROVIDER_SITE_OTHER): Payer: 59 | Admitting: Bariatrics

## 2020-03-21 ENCOUNTER — Other Ambulatory Visit (INDEPENDENT_AMBULATORY_CARE_PROVIDER_SITE_OTHER): Payer: Self-pay | Admitting: Adult Health

## 2020-03-21 DIAGNOSIS — I10 Essential (primary) hypertension: Secondary | ICD-10-CM

## 2020-03-22 ENCOUNTER — Ambulatory Visit (INDEPENDENT_AMBULATORY_CARE_PROVIDER_SITE_OTHER): Payer: 59 | Admitting: Bariatrics

## 2020-03-22 ENCOUNTER — Other Ambulatory Visit: Payer: Self-pay

## 2020-03-22 ENCOUNTER — Encounter (INDEPENDENT_AMBULATORY_CARE_PROVIDER_SITE_OTHER): Payer: Self-pay | Admitting: Bariatrics

## 2020-03-22 VITALS — BP 141/78 | HR 72 | Temp 98.1°F | Ht 60.0 in | Wt 369.0 lb

## 2020-03-22 DIAGNOSIS — I10 Essential (primary) hypertension: Secondary | ICD-10-CM

## 2020-03-22 DIAGNOSIS — R7303 Prediabetes: Secondary | ICD-10-CM

## 2020-03-22 DIAGNOSIS — E559 Vitamin D deficiency, unspecified: Secondary | ICD-10-CM | POA: Diagnosis not present

## 2020-03-22 DIAGNOSIS — Z6841 Body Mass Index (BMI) 40.0 and over, adult: Secondary | ICD-10-CM

## 2020-03-22 DIAGNOSIS — Z9189 Other specified personal risk factors, not elsewhere classified: Secondary | ICD-10-CM

## 2020-03-23 LAB — HEMOGLOBIN A1C
Est. average glucose Bld gHb Est-mCnc: 126 mg/dL
Hgb A1c MFr Bld: 6 % — ABNORMAL HIGH (ref 4.8–5.6)

## 2020-03-23 LAB — COMPREHENSIVE METABOLIC PANEL
ALT: 16 IU/L (ref 0–32)
AST: 18 IU/L (ref 0–40)
Albumin/Globulin Ratio: 1.3 (ref 1.2–2.2)
Albumin: 4.4 g/dL (ref 3.8–4.9)
Alkaline Phosphatase: 121 IU/L (ref 48–121)
BUN/Creatinine Ratio: 22 (ref 9–23)
BUN: 20 mg/dL (ref 6–24)
Bilirubin Total: 0.5 mg/dL (ref 0.0–1.2)
CO2: 29 mmol/L (ref 20–29)
Calcium: 9.8 mg/dL (ref 8.7–10.2)
Chloride: 92 mmol/L — ABNORMAL LOW (ref 96–106)
Creatinine, Ser: 0.93 mg/dL (ref 0.57–1.00)
GFR calc Af Amer: 80 mL/min/{1.73_m2} (ref >59)
GFR calc non Af Amer: 69 mL/min/{1.73_m2} (ref >59)
Globulin, Total: 3.5 g/dL (ref 1.5–4.5)
Glucose: 92 mg/dL (ref 65–99)
Potassium: 4.4 mmol/L (ref 3.5–5.2)
Sodium: 135 mmol/L (ref 134–144)
Total Protein: 7.9 g/dL (ref 6.0–8.5)

## 2020-03-23 LAB — VITAMIN D 25 HYDROXY (VIT D DEFICIENCY, FRACTURES): Vit D, 25-Hydroxy: 33.5 ng/mL (ref 30.0–100.0)

## 2020-03-23 LAB — INSULIN, RANDOM: INSULIN: 13 u[IU]/mL (ref 2.6–24.9)

## 2020-03-23 NOTE — Progress Notes (Signed)
Chief Complaint:   Christina Dominguez is here to discuss her progress with her obesity treatment plan along with follow-up of her obesity related diagnoses. Christina Dominguez is on the Category 4 Plan and states she is following her eating plan approximately 50% of the time. Christina Dominguez states she is exercising 0 minutes 0 times per week.  Today's visit was #: 6 Starting weight: 377 lbs Starting date: 12/21/2019 Today's weight: 368 lbs Today's date: 03/22/2020 Total lbs lost to date: 9 Total lbs lost since last in-office visit: 3  Interim History: Christina Dominguez is down another 3 lbs. She states she did increase her intake of water and protein. She did skip some meals.  Subjective:   Essential hypertension. Christina Dominguez is taking Norvasc and Maxzide.  BP Readings from Last 3 Encounters:  03/22/20 (!) 141/78  03/03/20 (!) 139/64  02/16/20 (!) 151/85   Lab Results  Component Value Date   CREATININE 0.98 12/21/2019   Vitamin D deficiency. Christina Dominguez is taking Vitamin D supplementation.    Ref. Range 12/21/2019 13:50  Vitamin D, 25-Hydroxy Latest Ref Range: 30.0 - 100.0 ng/mL 28.6 (L)   Prediabetes. Christina Dominguez has a diagnosis of prediabetes based on her elevated HgA1c and was informed this puts her at greater risk of developing diabetes. She continues to work on diet and exercise to decrease her risk of diabetes. She denies nausea or hypoglycemia. No polyphagia.  Lab Results  Component Value Date   HGBA1C 6.0 (H) 12/21/2019   Lab Results  Component Value Date   INSULIN 9.0 12/21/2019   At risk for hypoglycemia. Christina Dominguez is at increased risk for hypoglycemia due to changes in diet, diagnosis of diabetes, and/or insulin use.  Assessment/Plan:   Essential hypertension. Christina Dominguez is working on healthy weight loss and exercise to improve blood pressure control. We will watch for signs of hypotension as she continues her lifestyle modifications. She will continue her medications as directed.    Vitamin D deficiency. Low Vitamin D level contributes to fatigue and are associated with obesity, breast, and colon cancer. She agrees to continue to take Vitamin D as directed and VITAMIN D 25 Hydroxy (Vit-D Deficiency, Fractures) level will be checked today.  Prediabetes. Christina Dominguez will continue to work on weight loss, exercise, and decreasing simple carbohydrates to help decrease the risk of diabetes. Hemoglobin A1c, Insulin, random levels will be checked today.  At risk for hypoglycemia. Christina Dominguez was given approximately 15 minutes of counseling today regarding prevention of hypoglycemia. She was advised of symptoms of hypoglycemia. Christina Dominguez was instructed to avoid skipping meals, eat regular protein rich meals and schedule low calorie snacks as needed.   Repetitive spaced learning was employed today to elicit superior memory formation and behavioral change.  Class 3 severe obesity with serious comorbidity and body mass index (BMI) greater than or equal to 70 in adult, unspecified obesity type (Christina Dominguez).  Christina Dominguez is currently in the action stage of change. As such, her goal is to continue with weight loss efforts. She has agreed to the Category 4 Plan.   She will work on meal planning and intentional eating.   Exercise goals: All adults should avoid inactivity. Some physical activity is better than none, and adults who participate in any amount of physical activity gain some health benefits.  Behavioral modification strategies: increasing lean protein intake, decreasing simple carbohydrates, increasing vegetables, increasing water intake, decreasing eating out, no skipping meals, meal planning and cooking strategies, keeping healthy foods in the home and planning for success.  Christina Dominguez has agreed to follow-up with our clinic in 2-3 weeks. She was informed of the importance of frequent follow-up visits to maximize her success with intensive lifestyle modifications for her multiple health conditions.    Christina Dominguez was informed we would discuss her lab results at her next visit unless there is a critical issue that needs to be addressed sooner. Christina Dominguez agreed to keep her next visit at the agreed upon time to discuss these results.  Objective:   Blood pressure (!) 141/78, pulse 72, temperature 98.1 F (36.7 C), height 5' (1.524 m), weight (!) 369 lb (167.4 kg), SpO2 97 %. Body mass index is 72.07 kg/m.  General: Cooperative, alert, well developed, in no acute distress. HEENT: Conjunctivae and lids unremarkable. Cardiovascular: Regular rhythm.  Lungs: Normal work of breathing. Neurologic: No focal deficits.   Lab Results  Component Value Date   CREATININE 0.93 03/22/2020   BUN 20 03/22/2020   NA 135 03/22/2020   K 4.4 03/22/2020   CL 92 (L) 03/22/2020   CO2 29 03/22/2020   Lab Results  Component Value Date   ALT 16 03/22/2020   AST 18 03/22/2020   ALKPHOS 121 03/22/2020   BILITOT 0.5 03/22/2020   Lab Results  Component Value Date   HGBA1C 6.0 (H) 03/22/2020   HGBA1C 6.0 (H) 12/21/2019   Lab Results  Component Value Date   INSULIN 13.0 03/22/2020   INSULIN 9.0 12/21/2019   Lab Results  Component Value Date   TSH 2.230 12/21/2019   Lab Results  Component Value Date   CHOL 158 12/21/2019   HDL 58 12/21/2019   LDLCALC 88 12/21/2019   TRIG 58 12/21/2019   No results found for: WBC, HGB, HCT, MCV, PLT No results found for: IRON, TIBC, FERRITIN  Attestation Statements:   Reviewed by clinician on day of visit: allergies, medications, problem list, medical history, surgical history, family history, social history, and previous encounter notes.  [name], am acting as Location manager for [employer], DO   I have reviewed the above documentation for accuracy and completeness, and I agree with the above. Jearld Lesch, DO

## 2020-03-24 ENCOUNTER — Encounter (INDEPENDENT_AMBULATORY_CARE_PROVIDER_SITE_OTHER): Payer: Self-pay | Admitting: Bariatrics

## 2020-03-28 ENCOUNTER — Ambulatory Visit (INDEPENDENT_AMBULATORY_CARE_PROVIDER_SITE_OTHER): Payer: 59 | Admitting: Psychology

## 2020-03-28 DIAGNOSIS — F3189 Other bipolar disorder: Secondary | ICD-10-CM

## 2020-04-05 ENCOUNTER — Ambulatory Visit (INDEPENDENT_AMBULATORY_CARE_PROVIDER_SITE_OTHER): Payer: 59 | Admitting: Bariatrics

## 2020-04-11 ENCOUNTER — Ambulatory Visit (INDEPENDENT_AMBULATORY_CARE_PROVIDER_SITE_OTHER): Payer: 59 | Admitting: Psychology

## 2020-04-11 DIAGNOSIS — F3189 Other bipolar disorder: Secondary | ICD-10-CM

## 2020-04-14 ENCOUNTER — Ambulatory Visit (INDEPENDENT_AMBULATORY_CARE_PROVIDER_SITE_OTHER): Payer: 59 | Admitting: Bariatrics

## 2020-04-21 ENCOUNTER — Other Ambulatory Visit (INDEPENDENT_AMBULATORY_CARE_PROVIDER_SITE_OTHER): Payer: Self-pay | Admitting: Bariatrics

## 2020-04-21 DIAGNOSIS — E559 Vitamin D deficiency, unspecified: Secondary | ICD-10-CM

## 2020-04-28 ENCOUNTER — Other Ambulatory Visit (INDEPENDENT_AMBULATORY_CARE_PROVIDER_SITE_OTHER): Payer: Self-pay | Admitting: Adult Health

## 2020-04-28 DIAGNOSIS — I10 Essential (primary) hypertension: Secondary | ICD-10-CM

## 2020-05-03 ENCOUNTER — Ambulatory Visit: Payer: 59 | Admitting: Psychology

## 2020-06-17 ENCOUNTER — Inpatient Hospital Stay: Admission: RE | Admit: 2020-06-17 | Payer: 59 | Source: Ambulatory Visit

## 2020-07-25 ENCOUNTER — Other Ambulatory Visit (INDEPENDENT_AMBULATORY_CARE_PROVIDER_SITE_OTHER): Payer: Self-pay | Admitting: Adult Health

## 2020-07-25 DIAGNOSIS — E559 Vitamin D deficiency, unspecified: Secondary | ICD-10-CM

## 2020-10-12 ENCOUNTER — Ambulatory Visit (INDEPENDENT_AMBULATORY_CARE_PROVIDER_SITE_OTHER): Payer: 59 | Admitting: Psychology

## 2020-10-12 DIAGNOSIS — F3189 Other bipolar disorder: Secondary | ICD-10-CM

## 2020-10-19 ENCOUNTER — Ambulatory Visit: Payer: 59 | Admitting: Psychology

## 2020-10-25 ENCOUNTER — Ambulatory Visit (INDEPENDENT_AMBULATORY_CARE_PROVIDER_SITE_OTHER): Payer: 59 | Admitting: Psychology

## 2020-10-25 DIAGNOSIS — F3189 Other bipolar disorder: Secondary | ICD-10-CM | POA: Diagnosis not present

## 2020-11-03 ENCOUNTER — Ambulatory Visit (INDEPENDENT_AMBULATORY_CARE_PROVIDER_SITE_OTHER): Payer: 59 | Admitting: Psychology

## 2020-11-03 DIAGNOSIS — F3189 Other bipolar disorder: Secondary | ICD-10-CM | POA: Diagnosis not present

## 2020-11-10 ENCOUNTER — Ambulatory Visit (INDEPENDENT_AMBULATORY_CARE_PROVIDER_SITE_OTHER): Payer: 59 | Admitting: Psychology

## 2020-11-10 DIAGNOSIS — F3189 Other bipolar disorder: Secondary | ICD-10-CM

## 2020-11-16 ENCOUNTER — Ambulatory Visit (INDEPENDENT_AMBULATORY_CARE_PROVIDER_SITE_OTHER): Payer: 59 | Admitting: Psychology

## 2020-11-16 DIAGNOSIS — F3189 Other bipolar disorder: Secondary | ICD-10-CM | POA: Diagnosis not present

## 2020-11-21 ENCOUNTER — Ambulatory Visit (INDEPENDENT_AMBULATORY_CARE_PROVIDER_SITE_OTHER): Payer: 59 | Admitting: Psychology

## 2020-11-21 DIAGNOSIS — F3189 Other bipolar disorder: Secondary | ICD-10-CM

## 2020-12-01 ENCOUNTER — Ambulatory Visit: Payer: 59 | Admitting: Psychology

## 2020-12-08 ENCOUNTER — Ambulatory Visit (INDEPENDENT_AMBULATORY_CARE_PROVIDER_SITE_OTHER): Payer: 59 | Admitting: Psychology

## 2020-12-08 DIAGNOSIS — F3189 Other bipolar disorder: Secondary | ICD-10-CM

## 2020-12-20 ENCOUNTER — Ambulatory Visit: Payer: Self-pay | Admitting: Psychology

## 2021-03-20 ENCOUNTER — Encounter (HOSPITAL_BASED_OUTPATIENT_CLINIC_OR_DEPARTMENT_OTHER): Payer: Self-pay

## 2021-03-20 ENCOUNTER — Other Ambulatory Visit: Payer: Self-pay

## 2021-03-20 ENCOUNTER — Emergency Department (HOSPITAL_BASED_OUTPATIENT_CLINIC_OR_DEPARTMENT_OTHER)
Admission: EM | Admit: 2021-03-20 | Discharge: 2021-03-20 | Disposition: A | Discharge status: Home / Self Care | Payer: 59 | Attending: Emergency Medicine | Admitting: Emergency Medicine

## 2021-03-20 ENCOUNTER — Emergency Department (HOSPITAL_BASED_OUTPATIENT_CLINIC_OR_DEPARTMENT_OTHER): Payer: 59

## 2021-03-20 DIAGNOSIS — Z20822 Contact with and (suspected) exposure to covid-19: Secondary | ICD-10-CM | POA: Diagnosis not present

## 2021-03-20 DIAGNOSIS — Z4801 Encounter for change or removal of surgical wound dressing: Secondary | ICD-10-CM | POA: Diagnosis not present

## 2021-03-20 DIAGNOSIS — I110 Hypertensive heart disease with heart failure: Secondary | ICD-10-CM | POA: Insufficient documentation

## 2021-03-20 DIAGNOSIS — L97811 Non-pressure chronic ulcer of other part of right lower leg limited to breakdown of skin: Secondary | ICD-10-CM | POA: Insufficient documentation

## 2021-03-20 DIAGNOSIS — J45909 Unspecified asthma, uncomplicated: Secondary | ICD-10-CM | POA: Insufficient documentation

## 2021-03-20 DIAGNOSIS — L03115 Cellulitis of right lower limb: Secondary | ICD-10-CM | POA: Diagnosis not present

## 2021-03-20 DIAGNOSIS — Z79899 Other long term (current) drug therapy: Secondary | ICD-10-CM | POA: Diagnosis not present

## 2021-03-20 DIAGNOSIS — I509 Heart failure, unspecified: Secondary | ICD-10-CM | POA: Insufficient documentation

## 2021-03-20 DIAGNOSIS — L97812 Non-pressure chronic ulcer of other part of right lower leg with fat layer exposed: Secondary | ICD-10-CM

## 2021-03-20 LAB — CBC WITH DIFFERENTIAL/PLATELET
Abs Immature Granulocytes: 0.03 10*3/uL (ref 0.00–0.07)
Basophils Absolute: 0 10*3/uL (ref 0.0–0.1)
Basophils Relative: 0 %
Eosinophils Absolute: 0.2 10*3/uL (ref 0.0–0.5)
Eosinophils Relative: 2 %
HCT: 42.2 % (ref 36.0–46.0)
Hemoglobin: 13.3 g/dL (ref 12.0–15.0)
Immature Granulocytes: 0 %
Lymphocytes Relative: 16 %
Lymphs Abs: 1.5 10*3/uL (ref 0.7–4.0)
MCH: 28.9 pg (ref 26.0–34.0)
MCHC: 31.5 g/dL (ref 30.0–36.0)
MCV: 91.5 fL (ref 80.0–100.0)
Monocytes Absolute: 0.5 10*3/uL (ref 0.1–1.0)
Monocytes Relative: 5 %
Neutro Abs: 7.3 10*3/uL (ref 1.7–7.7)
Neutrophils Relative %: 77 %
Platelets: 372 10*3/uL (ref 150–400)
RBC: 4.61 MIL/uL (ref 3.87–5.11)
RDW: 14.3 % (ref 11.5–15.5)
WBC: 9.6 10*3/uL (ref 4.0–10.5)
nRBC: 0 % (ref 0.0–0.2)

## 2021-03-20 LAB — COMPREHENSIVE METABOLIC PANEL
ALT: 14 U/L (ref 0–44)
AST: 17 U/L (ref 15–41)
Albumin: 4 g/dL (ref 3.5–5.0)
Alkaline Phosphatase: 100 U/L (ref 38–126)
Anion gap: 9 (ref 5–15)
BUN: 23 mg/dL — ABNORMAL HIGH (ref 6–20)
CO2: 30 mmol/L (ref 22–32)
Calcium: 9.7 mg/dL (ref 8.9–10.3)
Chloride: 96 mmol/L — ABNORMAL LOW (ref 98–111)
Creatinine, Ser: 0.96 mg/dL (ref 0.44–1.00)
GFR, Estimated: >60 mL/min (ref >60)
Glucose, Bld: 88 mg/dL (ref 70–99)
Potassium: 3.6 mmol/L (ref 3.5–5.1)
Sodium: 135 mmol/L (ref 135–145)
Total Bilirubin: 0.5 mg/dL (ref 0.3–1.2)
Total Protein: 7.8 g/dL (ref 6.5–8.1)

## 2021-03-20 LAB — RESP PANEL BY RT-PCR (FLU A&B, COVID) ARPGX2
Influenza A by PCR: NEGATIVE
Influenza B by PCR: NEGATIVE
SARS Coronavirus 2 by RT PCR: NEGATIVE

## 2021-03-20 LAB — LACTIC ACID, PLASMA
Lactic Acid, Venous: 0.7 mmol/L (ref 0.5–1.9)
Lactic Acid, Venous: 0.8 mmol/L (ref 0.5–1.9)

## 2021-03-20 MED ORDER — CLINDAMYCIN HCL 150 MG PO CAPS: 450.0000 mg | ORAL_CAPSULE | Freq: Three times a day (TID) | ORAL | 0 refills | Status: AC

## 2021-03-20 NOTE — Discharge Instructions (Addendum)
Your history, exam and work-up today are consistent with a significant cellulitis on your right shin that we are concerned is deep in the leg.  We had imaging which did not confirm bony involvement but given the proximity to bone with, we had a shared decision-making conversation offering admission for IV antibiotics but you prefer to try outpatient antibiotics initially.  Your labs are otherwise reassuring.  Please take the antibiotics and follow-up with both a primary doctor and orthopedics.  If any symptoms change or worsen or do not improve, please return to the nearest emergency department immediately as you would likely need admission for further treatment.

## 2021-03-20 NOTE — ED Triage Notes (Signed)
Pt states she fell over the edge of the bathtub approximately 7 weeks ago injuring right lower leg.  Since then would has been draining and pt reports wound has an odor.  Pt presents with a wrap to the right lower leg, stating she has changed the wrap daily.  She has been cleaning the wound with alcohol and saline solution.  She went to UC today and UC instructed her to come to ED.

## 2021-03-20 NOTE — ED Notes (Signed)
States fell one week ago in bathroom and hit right shin on tub.  States got wound that will not heal.  Noted open wound about 1 1/2 inch long and 1/2 inch wide.  Two smaller wounds also noted below open wound.

## 2021-03-20 NOTE — ED Notes (Signed)
Wet to dry dressing placed on pt's leg.

## 2021-03-20 NOTE — ED Provider Notes (Signed)
[facility] EMERGENCY DEPT Provider Note   CSN: [id number] Arrival date & time: 03/20/21  1303     History Chief Complaint  Patient presents with   Wound Infection    Christina Dominguez is a 55 y.o. female.  The history is provided by the patient and medical records. No language interpreter was used.  Wound Check This is a new problem. The current episode started more than 1 week ago. The problem occurs constantly. The problem has been rapidly worsening. Pertinent negatives include no chest pain, no abdominal pain, no headaches and no shortness of breath. Nothing aggravates the symptoms. Nothing relieves the symptoms. She has tried nothing for the symptoms. The treatment provided no relief.      Past Medical History:  Diagnosis Date   Anxiety    Asthma    Bipolar 1 disorder (HCC)    CHF (congestive heart failure) (HCC)    Depression    Edema of both lower extremities    High blood pressure    Joint pain     Patient Active Problem List   Diagnosis Date Noted   Prediabetes 12/22/2019   Vitamin D insufficiency 12/22/2019   Class 3 severe obesity with serious comorbidity and body mass index (BMI) greater than or equal to 70 in adult [facility]) 12/21/2019   Essential hypertension 04/30/2019   Mild intermittent asthma without complication [id number]    No past surgical history on file.   OB History     Gravida  0   Para  0   Term  0   Preterm  0   AB  0   Living  0      SAB  0   IAB  0   Ectopic  0   Multiple  0   Live Births  0           No family history on file.  Social History   Tobacco Use   Smoking status: Never   Smokeless tobacco: Never  Vaping Use   Vaping Use: Never used  Substance Use Topics   Alcohol use: Yes    Comment: 1x/month   Drug use: Never    Home Medications Prior to Admission medications   Medication Sig Start Date End Date Taking? Authorizing Provider  albuterol (ACCUNEB) 0.63 MG/3ML nebulizer  solution Take 1 ampule by nebulization every 6 (six) hours as needed for wheezing.    [provider]  amLODipine (NORVASC) 2.5 MG tablet Take 1 tablet (2.5 mg total) by mouth daily. 02/16/20   Danford, Valetta Fuller D, NP  triamterene-hydrochlorothiazide (MAXZIDE) 75-50 MG tablet Take 1 tablet by mouth daily.    [provider]  Vitamin D, Ergocalciferol, (DRISDOL) 1.25 MG (50000 UNIT) CAPS capsule Take 1 capsule (50,000 Units total) by mouth every 7 (seven) days. 03/03/20   Jearld Lesch A, DO    Allergies    Aspirin  Review of Systems   Review of Systems  Constitutional:  Negative for chills, fatigue and fever.  HENT:  Negative for congestion.   Respiratory:  Negative for chest tightness, shortness of breath and wheezing.   Cardiovascular:  Negative for chest pain and palpitations.  Gastrointestinal:  Negative for abdominal pain, constipation, diarrhea, nausea and vomiting.  Genitourinary:  Negative for dysuria and flank pain.  Musculoskeletal:  Negative for back pain.  Skin:  Positive for color change and wound.  Neurological:  Negative for light-headedness and headaches.  Psychiatric/Behavioral:  Negative for agitation.   All other systems  reviewed and are negative.  Physical Exam Updated Vital Signs BP (!) 156/88 (BP Location: Right Arm)   Pulse 80   Temp 98.2 F (36.8 C)   Resp 20   SpO2 97%   Physical Exam Vitals and nursing note reviewed.  Constitutional:      General: She is not in acute distress.    Appearance: She is well-developed. She is not ill-appearing, toxic-appearing or diaphoretic.  HENT:     Head: Normocephalic and atraumatic.  Eyes:     Conjunctiva/sclera: Conjunctivae normal.  Cardiovascular:     Rate and Rhythm: Normal rate and regular rhythm.     Heart sounds: No murmur heard. Pulmonary:     Effort: Pulmonary effort is normal. No respiratory distress.     Breath sounds: Normal breath sounds.  Abdominal:     Palpations: Abdomen is soft.      Tenderness: There is no abdominal tenderness. There is no guarding or rebound.  Musculoskeletal:        General: Tenderness present.     Cervical back: Neck supple.     Right lower leg: Tenderness present.       Legs:  Skin:    General: Skin is warm and dry.     Capillary Refill: Capillary refill takes less than 2 seconds.     Findings: Erythema present.  Neurological:     General: No focal deficit present.     Mental Status: She is alert. Mental status is at baseline.     Sensory: No sensory deficit.     Motor: No weakness.       ED Results / Procedures / Treatments   Labs (all labs ordered are listed, but only abnormal results are displayed) Labs Reviewed  COMPREHENSIVE METABOLIC PANEL - Abnormal; Notable for the following components:      Result Value   Chloride 96 (*)    BUN 23 (*)    All other components within normal limits  RESP PANEL BY RT-PCR (FLU A&B, COVID) ARPGX2  CULTURE, BLOOD (ROUTINE X 2)  CULTURE, BLOOD (ROUTINE X 2)  CBC WITH DIFFERENTIAL/PLATELET  LACTIC ACID, PLASMA  LACTIC ACID, PLASMA    EKG None  Radiology DG Tibia/Fibula Right  Result Date: 03/20/2021 CLINICAL DATA:  Wound on shin that has had worsening infection for 1 week. Deep ulceration concerning for bone involvement. Rule out osteomyelitis or other infection EXAM: RIGHT TIBIA AND FIBULA - 2 VIEW COMPARISON:  None. FINDINGS: No cortical erosion or destruction. There is no evidence of acute displaced fracture. Tricompartmental moderate severe degenerative changes of the right knee. Right ankle grossly unremarkable. Posterior and plantar calcaneal spurs noted. Diffuse subcutaneus soft tissue edema of the leg. Also subcutaneus soft tissue emphysema along the anterior leg. No retained radiopaque foreign body. IMPRESSION: 1. No radiographic findings of osteomyelitis. If high clinical concern, please obtain MRI (with intravenous contrast if GFR greater than 30) for further evaluation. 2.  No acute  displaced fracture or dislocation. 3. Subcutaneus soft tissue edema and emphysema along the anterior leg. Electronically Signed   By: Iven Finn M.D.   On: 03/20/2021 16:22    Procedures Procedures   Medications Ordered in ED Medications - No data to display  ED Course  I have reviewed the triage vital signs and the nursing notes.  Pertinent labs & imaging results that were available during my care of the patient were reviewed by me and considered in my medical decision making (see chart for details).  MDM Rules/Calculators/A&P                           Christina Dominguez is a 55 y.o. female with a past medical history significant for asthma, hypertension, CHF, anxiety, and obesity who presents with wound infection of the right shin.  Patient reports that last month, she bumped her right leg on a bathtub in the middle of the night and has had a small wound ever since.  She reports that around a week ago, it started to get worse and and grow.  It also began to have a foul smell.  She reports it is now having warmth, redness, pus, and worsening discomfort.  She still able to walk on it and denies numbness or tingling or weakness distally.  She went to urgent care today and told her she needs to come the emergency department for IV antibiotics and admission for very deep infection on her shin.  She says that she had 2 telehealth visits in the interim since the injury however she reports she did not receive any prescription for antibiotics at that time.  She does report that only the 4 last week has looked infected and really over the last few days it has acutely worsened more so.  She reports her tetanus is up-to-date.  On exam, patient has an area of erythema and redness and warmth and tenderness on the right mid shin.  There is a wound of several centimeters that does have some purulence deep in it.  It does go deep into the skin.  There are some abrasions more inferiorly.  Distally she has  intact DP and PT pulse.  Intact sensation and strength in the foot and ankle.  No tenderness of the knee or hip.  Lungs clear and chest nontender.   Clinical I am concerned about deep infection of the right shin.  We will get x-ray to look for bony infection or subcutaneous gas.  We will get some screening blood work and a COVID test.  Patient does report that a friend had COVID several days ago.  Based on the appearance of the wound, I do suspect patient may end up needing admission for IV antibiotics given the extent of the wound.  8:00 PM Work-up returned.  Patient's x-ray does not show evidence of osteomyelitis but does show some soft tissue swelling.  No gas seen.  On my review, it does appear the defect goes near the bone but there does not appear to be osteomyelitis.  MRI was recommended if clinically felt necessary.  Her labs otherwise reassuring with no leukocytosis or lactic acidosis.  I went and had a shared decision made conversation with patient and family and offered admission for IV antibiotics and likely MRI to definitively rule out early osteomyelitis.  They feel that she has not failed outpatient antibiotics, they like to try antibiotics and if symptoms do not improve over the next 24 to 48 hours, they will return for likely admission and further work-up.  They understand the risks of leading to osteomyelitis or leg amputation but still feel comfortable going home.  They were offered a dose of antibiotics here but would rather fill it on the way home so they can drive before gets too late.  They understood return precautions and had no other questions or concerns and patient discharged in stable condition.   Final Clinical Impression(s) / ED Diagnoses Final diagnoses:  Cellulitis of right lower  extremity  Skin ulcer of right pretibial region with fat layer exposed ([place])    Rx / DC Orders ED Discharge Orders          Ordered    clindamycin (CLEOCIN) 150 MG capsule  3 times daily         03/20/21 2003           Clinical Impression: 1. Cellulitis of right lower extremity   2. Skin ulcer of right pretibial region with fat layer exposed ([place])     Disposition: Discharge  Condition: Good  I have discussed the results, Dx and Tx plan with the pt(& family if present). He/she/they expressed understanding and agree(s) with the plan. Discharge instructions discussed at great length. Strict return precautions discussed and pt &/or family have verbalized understanding of the instructions. No further questions at time of discharge.    New Prescriptions   CLINDAMYCIN (CLEOCIN) 150 MG CAPSULE    Take 3 capsules (450 mg total) by mouth 3 (three) times daily for 10 days.    Follow Up: [place] [phone] Schedule an appointment as soon as possible for a visit    [place], [address] [place] [address] [phone]   at [facility]  Marchia Bond, MD [place] [address] [facility] [place] [phone]       Taariq Leitz, Gwenyth Allegra, MD 03/20/21 2005

## 2021-03-21 ENCOUNTER — Telehealth: Payer: Self-pay

## 2021-03-21 NOTE — Telephone Encounter (Signed)
Not sure why she was referred to me.  Needs to be seen by one of her doctors who is accepting new patients.

## 2021-03-21 NOTE — Telephone Encounter (Signed)
Spoke with pt  and [facility] stated [facility] taking [facility] , spoke with kim stated its okay to schedule patient for NP/ER slot for 40 mins , scheduled with Christina Dominguez on 08/25

## 2021-03-21 NOTE — Telephone Encounter (Signed)
Patient called office today , trying to schedule an appointment with you because the AVS from her ED visit at [facility] stated schedule a follow up with Dr.Paz ASAP .... is it okay to schedule patient on your schedule for a hospital follow up ?

## 2021-03-23 ENCOUNTER — Other Ambulatory Visit: Payer: Self-pay

## 2021-03-23 ENCOUNTER — Ambulatory Visit (INDEPENDENT_AMBULATORY_CARE_PROVIDER_SITE_OTHER): Payer: 59 | Admitting: Orthopedic Surgery

## 2021-03-23 DIAGNOSIS — L97919 Non-pressure chronic ulcer of unspecified part of right lower leg with unspecified severity: Secondary | ICD-10-CM

## 2021-03-23 DIAGNOSIS — I87331 Chronic venous hypertension (idiopathic) with ulcer and inflammation of right lower extremity: Secondary | ICD-10-CM | POA: Diagnosis not present

## 2021-03-23 NOTE — Progress Notes (Signed)
[facility]   Office Visit Note   Patient: Christina Dominguez           Date of Birth: 04/28/1966           MRN: [id number] Visit Date: 03/23/2021              Requested by: No referring provider defined for this encounter. PCP: Pcp, No  Chief Complaint  Patient presents with   Right Leg - Pain, Wound Check      HPI: Normalities she was started on clindamycin wound cultures were negative.  [name] without intervention. [name] who was seen for initial evaluation for venous insufficiency ulcer right leg.  Patient went to the emergency room on August 15 she states she initially bumped her leg on a bathtub.  Radiograph showed no bony  Assessment & Plan: Visit Diagnoses: No diagnosis found.  Plan: We will apply a Dynaflex compression wrap reevaluate next week.  Follow-Up Instructions: No follow-ups on file.   Ortho Exam  Patient is alert, oriented, no adenopathy, well-dressed, normal affect, normal respiratory effort. Examination patient has a palpable dorsalis pedis pulse with good arterial inflow.  She has a necrotic ulcer over the tibial crest she has chronic venous stasis changes with brawny skin color changes and pitting edema.  There is fibrinous tissue at the base of the wound.  The ulcer measures 10 x 30 mm and 5 mm deep.  Imaging: No results found. No images are attached to the encounter.  Labs: Lab Results  Component Value Date   HGBA1C 6.0 (H) 03/22/2020   HGBA1C 6.0 (H) 12/21/2019   REPTSTATUS PENDING 03/20/2021   REPTSTATUS PENDING 03/20/2021   CULT  03/20/2021    NO GROWTH 2 DAYS Performed at [facility], [place] [address], [place], [place] [address]    CULT  03/20/2021    NO GROWTH 2 DAYS Performed at [facility] [address] [place], [place] [address]      Lab Results  Component Value Date   ALBUMIN 4.0 03/20/2021   ALBUMIN 4.4 03/22/2020   ALBUMIN 4.2 12/21/2019    No  results found for: MG Lab Results  Component Value Date   VD25OH 33.5 03/22/2020   VD25OH 28.6 (L) 12/21/2019    No results found for: PREALBUMIN CBC EXTENDED Latest Ref Rng & Units 03/20/2021  WBC 4.0 - 10.5 K/uL 9.6  RBC 3.87 - 5.11 MIL/uL 4.61  HGB 12.0 - 15.0 g/dL 13.3  HCT 36.0 - 46.0 % 42.2  PLT 150 - 400 K/uL 372  NEUTROABS 1.7 - 7.7 K/uL 7.3  LYMPHSABS 0.7 - 4.0 K/uL 1.5     There is no height or weight on file to calculate BMI.  Orders:  No orders of the defined types were placed in this encounter.  No orders of the defined types were placed in this encounter.    Procedures: No procedures performed  Clinical Data: No additional findings.  ROS:  All other systems negative, except as noted in the HPI. Review of Systems  Objective: Vital Signs: There were no vitals taken for this visit.  Specialty Comments:  No specialty comments available.  PMFS History: Patient Active Problem List   Diagnosis Date Noted   Prediabetes 12/22/2019   Vitamin D insufficiency 12/22/2019   Class 3 severe obesity with serious comorbidity and body mass index (BMI) greater than or equal to 70 in adult [facility])  12/21/2019   Essential hypertension 04/30/2019   Mild intermittent asthma without complication [id number]   Past Medical History:  Diagnosis Date   Anxiety    Asthma    Bipolar 1 disorder (HCC)    CHF (congestive heart failure) (HCC)    Depression    Edema of both lower extremities    High blood pressure    Joint pain     No family history on file.  No past surgical history on file. Social History   Occupational History   Occupation: Medical illustrator, work from home  Tobacco Use   Smoking status: Never   Smokeless tobacco: Never  Scientific laboratory technician Use: Never used  Substance and Sexual Activity   Alcohol use: Yes    Comment: 1x/month   Drug use: Never   Sexual activity: Not on file

## 2021-03-25 LAB — CULTURE, BLOOD (ROUTINE X 2)
Culture: NO GROWTH
Culture: NO GROWTH

## 2021-03-27 ENCOUNTER — Ambulatory Visit (INDEPENDENT_AMBULATORY_CARE_PROVIDER_SITE_OTHER): Payer: 59 | Admitting: Orthopedic Surgery

## 2021-03-27 ENCOUNTER — Encounter: Payer: Self-pay | Admitting: Orthopedic Surgery

## 2021-03-27 DIAGNOSIS — L97919 Non-pressure chronic ulcer of unspecified part of right lower leg with unspecified severity: Secondary | ICD-10-CM | POA: Diagnosis not present

## 2021-03-27 DIAGNOSIS — I87331 Chronic venous hypertension (idiopathic) with ulcer and inflammation of right lower extremity: Secondary | ICD-10-CM | POA: Diagnosis not present

## 2021-03-27 NOTE — Progress Notes (Signed)
Office Visit Note   Patient: Christina Dominguez           Date of Birth: Jan 22, 1966           MRN: [id number] Visit Date: 03/27/2021              Requested by: No referring provider defined for this encounter. PCP: Pcp, No  Chief Complaint  Patient presents with   Right Leg - Follow-up      HPI: [name] who presents in follow-up for traumatic venous stasis ulcer right leg.  Assessment & Plan: Visit Diagnoses:  1. Chronic venous hypertension (idiopathic) with ulcer and inflammation of right lower extremity (HCC)     Plan: The ulcer wound bed has shown significant improvement.  We will continue with a silver alginate and a Dynaflex compression wrap follow-up in 1 week.  Follow-Up Instructions: Return in about 1 week (around 04/03/2021).   Ortho Exam  Patient is alert, oriented, no adenopathy, well-dressed, normal affect, normal respiratory effort. Examination the swelling has decreased in the right leg the wound bed is debrided with gauze and she has approximately 75% healthy granulation tissue the wound bed has shown significant improvement there is no odor no drainage no cellulitis.  The wound is 30 x 10 mm and 3 mm deep Imaging: No results found. No images are attached to the encounter.  Labs: Lab Results  Component Value Date   HGBA1C 6.0 (H) 03/22/2020   HGBA1C 6.0 (H) 12/21/2019   REPTSTATUS 03/25/2021 FINAL 03/20/2021   REPTSTATUS 03/25/2021 FINAL 03/20/2021   CULT  03/20/2021    NO GROWTH 5 DAYS Performed at [facility], [place] [address], [place], [place] [address]    CULT  03/20/2021    NO GROWTH 5 DAYS Performed at [facility] [address], [place], [place] [address]      Lab Results  Component Value Date   ALBUMIN 4.0 03/20/2021   ALBUMIN 4.4 03/22/2020   ALBUMIN 4.2 12/21/2019    No results found for: MG Lab Results  Component Value Date   VD25OH 33.5 03/22/2020   VD25OH 28.6 (L) 12/21/2019    No  results found for: PREALBUMIN CBC EXTENDED Latest Ref Rng & Units 03/20/2021  WBC 4.0 - 10.5 K/uL 9.6  RBC 3.87 - 5.11 MIL/uL 4.61  HGB 12.0 - 15.0 g/dL 13.3  HCT 36.0 - 46.0 % 42.2  PLT 150 - 400 K/uL 372  NEUTROABS 1.7 - 7.7 K/uL 7.3  LYMPHSABS 0.7 - 4.0 K/uL 1.5     There is no height or weight on file to calculate BMI.  Orders:  No orders of the defined types were placed in this encounter.  No orders of the defined types were placed in this encounter.    Procedures: No procedures performed  Clinical Data: No additional findings.  ROS:  All other systems negative, except as noted in the HPI. Review of Systems  Objective: Vital Signs: There were no vitals taken for this visit.  Specialty Comments:  No specialty comments available.  PMFS History: Patient Active Problem List   Diagnosis Date Noted   Prediabetes 12/22/2019   Vitamin D insufficiency 12/22/2019   Class 3 severe obesity with serious comorbidity and body mass index (BMI) greater than or equal to 70 in adult [facility]) 12/21/2019   Essential hypertension 04/30/2019   Mild intermittent asthma without complication [id number]   Past Medical History:  Diagnosis Date   Anxiety  Asthma    Bipolar 1 disorder (HCC)    CHF (congestive heart failure) (HCC)    Depression    Edema of both lower extremities    High blood pressure    Joint pain     History reviewed. No pertinent family history.  History reviewed. No pertinent surgical history. Social History   Occupational History   Occupation: Medical illustrator, work from home  Tobacco Use   Smoking status: Never   Smokeless tobacco: Never  Scientific laboratory technician Use: Never used  Substance and Sexual Activity   Alcohol use: Yes    Comment: 1x/month   Drug use: Never   Sexual activity: Not on file

## 2021-03-30 ENCOUNTER — Ambulatory Visit: Payer: 59 | Admitting: Medical

## 2021-04-03 ENCOUNTER — Encounter: Payer: Self-pay | Admitting: Orthopedic Surgery

## 2021-04-03 ENCOUNTER — Ambulatory Visit (INDEPENDENT_AMBULATORY_CARE_PROVIDER_SITE_OTHER): Payer: 59 | Admitting: Orthopedic Surgery

## 2021-04-03 DIAGNOSIS — L97919 Non-pressure chronic ulcer of unspecified part of right lower leg with unspecified severity: Secondary | ICD-10-CM | POA: Diagnosis not present

## 2021-04-03 DIAGNOSIS — I87331 Chronic venous hypertension (idiopathic) with ulcer and inflammation of right lower extremity: Secondary | ICD-10-CM | POA: Diagnosis not present

## 2021-04-03 NOTE — Progress Notes (Signed)
Office Visit Note   Patient: Christina Dominguez           Date of Birth: 26-Mar-1966           MRN: [id number] Visit Date: 04/03/2021              Requested by: No referring provider defined for this encounter. PCP: Pcp, No  Chief Complaint  Patient presents with   Right Leg - Follow-up      HPI: Patient presents in follow-up for traumatic venous ulcer right leg anterior tibial crest.  She feels like she is improving.  Assessment & Plan: Visit Diagnoses: No diagnosis found.  Plan: We will apply the silver cell plus a Dynaflex wrap follow-up in 1 week.  Follow-Up Instructions: Return in about 1 week (around 04/10/2021).   Ortho Exam  Patient is alert, oriented, no adenopathy, well-dressed, normal affect, normal respiratory effort. Examination there is decreased swelling there is wrinkling of the skin there is brawny skin color changes from chronic venous insufficiency.  The wound bed now has excellent healthy granulation tissue it is 25 x 5 mm and 2 mm deep.  There is no cellulitis no odor no drainage.  Imaging: No results found. No images are attached to the encounter.  Labs: Lab Results  Component Value Date   HGBA1C 6.0 (H) 03/22/2020   HGBA1C 6.0 (H) 12/21/2019   REPTSTATUS 03/25/2021 FINAL 03/20/2021   REPTSTATUS 03/25/2021 FINAL 03/20/2021   CULT  03/20/2021    NO GROWTH 5 DAYS Performed at [facility], [place] [address], [place], [place] [address]    CULT  03/20/2021    NO GROWTH 5 DAYS Performed at [facility] [address] [place],  [address]      Lab Results  Component Value Date   ALBUMIN 4.0 03/20/2021   ALBUMIN 4.4 03/22/2020   ALBUMIN 4.2 12/21/2019    No results found for: MG Lab Results  Component Value Date   VD25OH 33.5 03/22/2020   VD25OH 28.6 (L) 12/21/2019    No results found for: PREALBUMIN CBC EXTENDED Latest Ref Rng & Units 03/20/2021  WBC 4.0 - 10.5 K/uL 9.6  RBC 3.87 - 5.11 MIL/uL 4.61  HGB 12.0 - 15.0  g/dL 13.3  HCT 36.0 - 46.0 % 42.2  PLT 150 - 400 K/uL 372  NEUTROABS 1.7 - 7.7 K/uL 7.3  LYMPHSABS 0.7 - 4.0 K/uL 1.5     There is no height or weight on file to calculate BMI.  Orders:  No orders of the defined types were placed in this encounter.  No orders of the defined types were placed in this encounter.    Procedures: No procedures performed  Clinical Data: No additional findings.  ROS:  All other systems negative, except as noted in the HPI. Review of Systems  Objective: Vital Signs: There were no vitals taken for this visit.  Specialty Comments:  No specialty comments available.  PMFS History: Patient Active Problem List   Diagnosis Date Noted   Prediabetes 12/22/2019   Vitamin D insufficiency 12/22/2019   Class 3 severe obesity with serious comorbidity and body mass index (BMI) greater than or equal to 70 in adult [facility]) 12/21/2019   Essential hypertension 04/30/2019   Mild intermittent asthma without complication [id number]   Past Medical History:  Diagnosis Date   Anxiety    Asthma    Bipolar 1 disorder (HCC)    CHF (congestive heart failure) ([place])    Depression  Edema of both lower extremities    High blood pressure    Joint pain     History reviewed. No pertinent family history.  History reviewed. No pertinent surgical history. Social History   Occupational History   Occupation: Medical illustrator, work from home  Tobacco Use   Smoking status: Never   Smokeless tobacco: Never  Scientific laboratory technician Use: Never used  Substance and Sexual Activity   Alcohol use: Yes    Comment: 1x/month   Drug use: Never   Sexual activity: Not on file

## 2021-04-11 ENCOUNTER — Other Ambulatory Visit: Payer: Self-pay

## 2021-04-11 ENCOUNTER — Encounter: Payer: Self-pay | Admitting: Orthopedic Surgery

## 2021-04-11 ENCOUNTER — Ambulatory Visit (INDEPENDENT_AMBULATORY_CARE_PROVIDER_SITE_OTHER): Payer: 59 | Admitting: Physician Assistant

## 2021-04-11 DIAGNOSIS — I87331 Chronic venous hypertension (idiopathic) with ulcer and inflammation of right lower extremity: Secondary | ICD-10-CM | POA: Diagnosis not present

## 2021-04-11 DIAGNOSIS — L97919 Non-pressure chronic ulcer of unspecified part of right lower leg with unspecified severity: Secondary | ICD-10-CM

## 2021-04-11 NOTE — Progress Notes (Signed)
Office Visit Note   Patient: Christina Dominguez           Date of Birth: 12/01/1965           MRN: [id number] Visit Date: 04/11/2021              Requested by: No referring provider defined for this encounter. PCP: Pcp, No  Chief Complaint  Patient presents with   Right Leg - Follow-up    Venous ulcer tibial crest       HPI: Patient is in follow-up for her right lower extremity venous ulcer.  She has been wrapped.  Unfortunately the wrap has slid down to the top of the wound  Assessment & Plan: Visit Diagnoses: No diagnosis found.  Plan: By measurement if we could keep the wraps up she could transition to a double extra-large sock next week.  Wrap with Unna today hopefully that will help keep it up  Follow-Up Instructions: No follow-ups on file.   Ortho Exam  Patient is alert, oriented, no adenopathy, well-dressed, normal affect, normal respiratory effort. She has significant wrinkling in the skin at the wound.  Unfortunately the wraps did roll down to just the top of the wound so the rest of the calf is still swollen.  No cellulitis no signs of infection wound itself has no drainage and is actually healing quite well No ascending cellulitis Imaging: No results found. No images are attached to the encounter.  Labs: Lab Results  Component Value Date   HGBA1C 6.0 (H) 03/22/2020   HGBA1C 6.0 (H) 12/21/2019   REPTSTATUS 03/25/2021 FINAL 03/20/2021   REPTSTATUS 03/25/2021 FINAL 03/20/2021   CULT  03/20/2021    NO GROWTH 5 DAYS Performed at [facility], [place] [address] [place], [place] [address]    CULT  03/20/2021    NO GROWTH 5 DAYS Performed at [facility] [address], [place], [place] [address]      Lab Results  Component Value Date   ALBUMIN 4.0 03/20/2021   ALBUMIN 4.4 03/22/2020   ALBUMIN 4.2 12/21/2019    No results found for: MG Lab Results  Component Value Date   VD25OH 33.5 03/22/2020   VD25OH 28.6 (L) 12/21/2019    No  results found for: PREALBUMIN CBC EXTENDED Latest Ref Rng & Units 03/20/2021  WBC 4.0 - 10.5 K/uL 9.6  RBC 3.87 - 5.11 MIL/uL 4.61  HGB 12.0 - 15.0 g/dL 13.3  HCT 36.0 - 46.0 % 42.2  PLT 150 - 400 K/uL 372  NEUTROABS 1.7 - 7.7 K/uL 7.3  LYMPHSABS 0.7 - 4.0 K/uL 1.5     There is no height or weight on file to calculate BMI.  Orders:  No orders of the defined types were placed in this encounter.  No orders of the defined types were placed in this encounter.    Procedures: No procedures performed  Clinical Data: No additional findings.  ROS:  All other systems negative, except as noted in the HPI. Review of Systems  Objective: Vital Signs: There were no vitals taken for this visit.  Specialty Comments:  No specialty comments available.  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  No past surgical history on file. Social History   Occupational History   Occupation: Medical illustrator, work from home  Tobacco Use   Smoking status: Never   Smokeless tobacco: Never  Scientific laboratory technician Use: Never used  Substance and Sexual Activity   Alcohol use: Yes    Comment: 1x/month   Drug use: Never   Sexual activity: Not on file

## 2021-04-17 ENCOUNTER — Ambulatory Visit (INDEPENDENT_AMBULATORY_CARE_PROVIDER_SITE_OTHER): Payer: 59 | Admitting: Physician Assistant

## 2021-04-17 ENCOUNTER — Encounter: Payer: Self-pay | Admitting: Orthopedic Surgery

## 2021-04-17 DIAGNOSIS — I87331 Chronic venous hypertension (idiopathic) with ulcer and inflammation of right lower extremity: Secondary | ICD-10-CM | POA: Diagnosis not present

## 2021-04-17 DIAGNOSIS — L97919 Non-pressure chronic ulcer of unspecified part of right lower leg with unspecified severity: Secondary | ICD-10-CM | POA: Diagnosis not present

## 2021-04-17 NOTE — Progress Notes (Signed)
Office Visit Note   Patient: Christina Dominguez           Date of Birth: Dec 16, 1965           MRN: [id number] Visit Date: 04/17/2021              Requested by: No referring provider defined for this encounter. PCP: Pcp, No  No chief complaint on file.     HPI: Patient presents in follow-up for her right leg.  She has been having serial wraps for venous insufficiency and swelling.  She did have an ulcer.  The wraps again slid down a little bit today but not as bad as last time  Assessment & Plan: Visit Diagnoses: No diagnosis found.  Plan: Patient will be placed in just a plain Ace wrap and she is going to get some double extra-large compression socks.  She will follow-up as needed if the swelling returns  Follow-Up Instructions: No follow-ups on file.   Ortho Exam  Patient is alert, oriented, no adenopathy, well-dressed, normal affect, normal respiratory effort. Examination demonstrates the right lower extremity the ulcer has dried out has some discoloration but no cellulitis.  There is no drainage.  She has no ascending cellulitis.  She has excellent wrinkling of the skin below where this compression wrap had not slipped.  Above she still has some swelling but no ascending cellulitis in the widest part of her calf she does measure fine for a double extra-large sock  Imaging: No results found. No images are attached to the encounter.  Labs: Lab Results  Component Value Date   HGBA1C 6.0 (H) 03/22/2020   HGBA1C 6.0 (H) 12/21/2019   REPTSTATUS 03/25/2021 FINAL 03/20/2021   REPTSTATUS 03/25/2021 FINAL 03/20/2021   CULT  03/20/2021    NO GROWTH 5 DAYS Performed at [facility], [place] [address], [place], [place] [address]    CULT  03/20/2021    NO GROWTH 5 DAYS Performed at [facility] [address], [place], [place] [address]      Lab Results  Component Value Date   ALBUMIN 4.0 03/20/2021   ALBUMIN 4.4 03/22/2020   ALBUMIN 4.2 12/21/2019    No  results found for: MG Lab Results  Component Value Date   VD25OH 33.5 03/22/2020   VD25OH 28.6 (L) 12/21/2019    No results found for: PREALBUMIN CBC EXTENDED Latest Ref Rng & Units 03/20/2021  WBC 4.0 - 10.5 K/uL 9.6  RBC 3.87 - 5.11 MIL/uL 4.61  HGB 12.0 - 15.0 g/dL 13.3  HCT 36.0 - 46.0 % 42.2  PLT 150 - 400 K/uL 372  NEUTROABS 1.7 - 7.7 K/uL 7.3  LYMPHSABS 0.7 - 4.0 K/uL 1.5     There is no height or weight on file to calculate BMI.  Orders:  No orders of the defined types were placed in this encounter.  No orders of the defined types were placed in this encounter.    Procedures: No procedures performed  Clinical Data: No additional findings.  ROS:  All other systems negative, except as noted in the HPI. Review of Systems  Objective: Vital Signs: There were no vitals taken for this visit.  Specialty Comments:  No specialty comments available.  PMFS History: Patient Active Problem List   Diagnosis Date Noted   Prediabetes 12/22/2019   Vitamin D insufficiency 12/22/2019   Class 3 severe obesity with serious comorbidity and body mass index (BMI) greater than or equal to 70 in  adult [facility]) 12/21/2019   Essential hypertension 04/30/2019   Mild intermittent asthma without complication XX123456   Past Medical History:  Diagnosis Date   Anxiety    Asthma    Bipolar 1 disorder (HCC)    CHF (congestive heart failure) (HCC)    Depression    Edema of both lower extremities    High blood pressure    Joint pain     No family history on file.  No past surgical history on file. Social History   Occupational History   Occupation: Medical illustrator, work from home  Tobacco Use   Smoking status: Never   Smokeless tobacco: Never  Scientific laboratory technician Use: Never used  Substance and Sexual Activity   Alcohol use: Yes    Comment: 1x/month   Drug use: Never   Sexual activity: Not on file

## 2021-05-15 ENCOUNTER — Ambulatory Visit: Payer: 59 | Admitting: Orthopedic Surgery

## 2021-05-17 ENCOUNTER — Ambulatory Visit: Payer: 59 | Admitting: Family

## 2021-05-24 ENCOUNTER — Ambulatory Visit: Payer: 59 | Admitting: Family

## 2021-05-30 ENCOUNTER — Telehealth: Payer: Self-pay | Admitting: Orthopedic Surgery

## 2021-05-30 NOTE — Telephone Encounter (Signed)
-----   Message from Pamella Pert, [place] sent at 05/29/2021  2:27 PM EDT ----- Regarding: can you call please? This pt has an appt on Wednesday at 4pm can you see if she will be willing to move it up to 2 pm? Thanks!

## 2021-05-30 NOTE — Telephone Encounter (Signed)
Tried calling pt twice, her VM is full so I can't leave a message but I will try again right before lunch.

## 2021-05-31 ENCOUNTER — Ambulatory Visit: Payer: 59 | Admitting: Family

## 2021-06-07 ENCOUNTER — Ambulatory Visit: Payer: 59 | Admitting: Family

## 2021-06-28 ENCOUNTER — Other Ambulatory Visit: Payer: Self-pay

## 2021-06-28 ENCOUNTER — Emergency Department (HOSPITAL_BASED_OUTPATIENT_CLINIC_OR_DEPARTMENT_OTHER)
Admission: EM | Admit: 2021-06-28 | Discharge: 2021-06-28 | Disposition: A | Discharge status: Home / Self Care | Payer: 59 | Attending: Emergency Medicine | Admitting: Emergency Medicine

## 2021-06-28 ENCOUNTER — Emergency Department (HOSPITAL_BASED_OUTPATIENT_CLINIC_OR_DEPARTMENT_OTHER): Payer: 59 | Admitting: Radiology

## 2021-06-28 ENCOUNTER — Encounter (HOSPITAL_BASED_OUTPATIENT_CLINIC_OR_DEPARTMENT_OTHER): Payer: Self-pay | Admitting: Emergency Medicine

## 2021-06-28 DIAGNOSIS — R0602 Shortness of breath: Secondary | ICD-10-CM | POA: Diagnosis not present

## 2021-06-28 DIAGNOSIS — I509 Heart failure, unspecified: Secondary | ICD-10-CM | POA: Insufficient documentation

## 2021-06-28 DIAGNOSIS — R600 Localized edema: Secondary | ICD-10-CM | POA: Insufficient documentation

## 2021-06-28 DIAGNOSIS — R42 Dizziness and giddiness: Secondary | ICD-10-CM | POA: Diagnosis present

## 2021-06-28 DIAGNOSIS — Z79899 Other long term (current) drug therapy: Secondary | ICD-10-CM | POA: Diagnosis not present

## 2021-06-28 DIAGNOSIS — M7989 Other specified soft tissue disorders: Secondary | ICD-10-CM

## 2021-06-28 DIAGNOSIS — J4520 Mild intermittent asthma, uncomplicated: Secondary | ICD-10-CM | POA: Diagnosis not present

## 2021-06-28 DIAGNOSIS — I110 Hypertensive heart disease with heart failure: Secondary | ICD-10-CM | POA: Insufficient documentation

## 2021-06-28 LAB — BASIC METABOLIC PANEL
Anion gap: 8 (ref 5–15)
BUN: 23 mg/dL — ABNORMAL HIGH (ref 6–20)
CO2: 31 mmol/L (ref 22–32)
Calcium: 9.8 mg/dL (ref 8.9–10.3)
Chloride: 97 mmol/L — ABNORMAL LOW (ref 98–111)
Creatinine, Ser: 0.97 mg/dL (ref 0.44–1.00)
GFR, Estimated: >60 mL/min (ref >60)
Glucose, Bld: 111 mg/dL — ABNORMAL HIGH (ref 70–99)
Potassium: 4.2 mmol/L (ref 3.5–5.1)
Sodium: 136 mmol/L (ref 135–145)

## 2021-06-28 LAB — HEPATIC FUNCTION PANEL
ALT: 15 U/L (ref 0–44)
AST: 19 U/L (ref 15–41)
Albumin: 4.2 g/dL (ref 3.5–5.0)
Alkaline Phosphatase: 105 U/L (ref 38–126)
Bilirubin, Direct: <0.1 mg/dL (ref 0.0–0.2)
Total Bilirubin: 0.4 mg/dL (ref 0.3–1.2)
Total Protein: 7.8 g/dL (ref 6.5–8.1)

## 2021-06-28 LAB — BRAIN NATRIURETIC PEPTIDE: B Natriuretic Peptide: 19.6 pg/mL (ref 0.0–100.0)

## 2021-06-28 LAB — CBC
HCT: 38.6 % (ref 36.0–46.0)
Hemoglobin: 12.2 g/dL (ref 12.0–15.0)
MCH: 29 pg (ref 26.0–34.0)
MCHC: 31.6 g/dL (ref 30.0–36.0)
MCV: 91.7 fL (ref 80.0–100.0)
Platelets: 349 10*3/uL (ref 150–400)
RBC: 4.21 MIL/uL (ref 3.87–5.11)
RDW: 15.1 % (ref 11.5–15.5)
WBC: 11.6 10*3/uL — ABNORMAL HIGH (ref 4.0–10.5)
nRBC: 0 % (ref 0.0–0.2)

## 2021-06-28 LAB — TROPONIN I (HIGH SENSITIVITY): Troponin I (High Sensitivity): 4 ng/L (ref <18)

## 2021-06-28 MED ORDER — FUROSEMIDE 20 MG PO TABS: 20.0000 mg | ORAL_TABLET | Freq: Every day | ORAL | 0 refills | Status: DC

## 2021-06-28 NOTE — ED Triage Notes (Signed)
Reports having symptoms since Sunday including diarrhea, dizziness, nausea and SOB.

## 2021-06-28 NOTE — Discharge Instructions (Signed)
Please read and follow all provided instructions.  Your diagnoses today include:  1. Shortness of breath   2. Leg swelling     Tests performed today include: An EKG of your heart -no sign of stress on the heart A chest x-ray -showed vascular congestion Cardiac enzymes - a blood test for heart muscle damage, was normal Blood test for heart failure -was normal Blood counts and electrolytes Vital signs. See below for your results today.   Medications prescribed:  Lasix -this is a diuretic medication, take it in the morning  Take any prescribed medications only as directed.  Follow-up instructions: Please follow-up with your primary care provider as soon as you can for further evaluation of your symptoms.  Have also provided referral for cardiology.  Please call unable to schedule an appointment for shortness of breath and for evaluation of heart failure.  Return instructions:  SEEK IMMEDIATE MEDICAL ATTENTION IF: You have severe chest pain, especially if the pain is crushing or pressure-like and spreads to the arms, back, neck, or jaw, or if you have sweating, nausea (feeling sick to your stomach), or shortness of breath. THIS IS AN EMERGENCY. Don't wait to see if the pain will go away. Get medical help at once. Call 911 or 0 (operator). DO NOT drive yourself to the hospital.  Your chest pain gets worse and does not go away with rest.  You have an attack of chest pain lasting longer than usual, despite rest and treatment with the medications your caregiver has prescribed.  You wake from sleep with chest pain or shortness of breath. You feel dizzy or faint. You have chest pain not typical of your usual pain for which you originally saw your caregiver.  You have any other emergent concerns regarding your health.  Your vital signs today were: BP (!) 153/84 (BP Location: Right Arm)   Pulse 89   Temp 98.2 F (36.8 C) (Oral)   Resp 20   Ht 5' 0.75" (1.543 m)   Wt (!) 175.5 kg   SpO2  97%   BMI 73.73 kg/m  If your blood pressure (BP) was elevated above 135/85 this visit, please have this repeated by your doctor within one month. --------------

## 2021-06-28 NOTE — ED Notes (Signed)
RT ambulated pt w/pulse ox. Pt sat 95% w/mild dyspnea during ambulation. Pts respiratory status remained stable w/no distress at this time.

## 2021-06-28 NOTE — ED Provider Notes (Signed)
[facility] EMERGENCY DEPT Provider Note   CSN: [id number] Arrival date & time: 06/28/21  1947     History No chief complaint on file.   Christina Dominguez is a 55 y.o. female.  Patient with history of hypertension, asthma, remote history of CHF, currently only on diuretic for blood pressure --presents to the emergency department for several days of dizziness described as being "swimmy headed", exertional dyspnea, nausea without vomiting, diarrhea, lower extremity edema.  Symptoms started gradually.  No chest pain.  Patient typically props herself up on several pillows to sleep.  She typically will have some degree of lower extremity edema, however current symptoms are worse than typical.  No fever or URI symptoms.  Mild nonproductive cough.  In regards to the dizziness, no vertigo or lightheadedness.  She feels slightly off balance when she walks but has not fallen.  Patient denies signs of stroke including: facial droop, slurred speech, aphasia, weakness/numbness in extremities, imbalance/trouble walking.  The onset of this condition was acute. The course is constant. Aggravating factors: exertion. Alleviating factors: none.        Past Medical History:  Diagnosis Date   Anxiety    Asthma    Bipolar 1 disorder (HCC)    CHF (congestive heart failure) (HCC)    Depression    Edema of both lower extremities    High blood pressure    Joint pain     Patient Active Problem List   Diagnosis Date Noted   Prediabetes 12/22/2019   Vitamin D insufficiency 12/22/2019   Class 3 severe obesity with serious comorbidity and body mass index (BMI) greater than or equal to 70 in adult [facility]) 12/21/2019   Essential hypertension 04/30/2019   Mild intermittent asthma without complication 76/72/0947    History reviewed. No pertinent surgical history.   OB History     Gravida  0   Para  0   Term  0   Preterm  0   AB  0   Living  0      SAB  0   IAB  0   Ectopic   0   Multiple  0   Live Births  0           No family history on file.  Social History   Tobacco Use   Smoking status: Never   Smokeless tobacco: Never  Vaping Use   Vaping Use: Never used  Substance Use Topics   Alcohol use: Yes    Comment: 1x/month   Drug use: Never    Home Medications Prior to Admission medications   Medication Sig Start Date End Date Taking? Authorizing Provider  albuterol (ACCUNEB) 0.63 MG/3ML nebulizer solution Take 1 ampule by nebulization every 6 (six) hours as needed for wheezing.    [provider]  amLODipine (NORVASC) 2.5 MG tablet Take 1 tablet (2.5 mg total) by mouth daily. 02/16/20   Danford, Valetta Fuller D, NP  triamterene-hydrochlorothiazide (MAXZIDE) 75-50 MG tablet Take 1 tablet by mouth daily.    [provider]  Vitamin D, Ergocalciferol, (DRISDOL) 1.25 MG (50000 UNIT) CAPS capsule Take 1 capsule (50,000 Units total) by mouth every 7 (seven) days. 03/03/20   Jearld Lesch A, DO    Allergies    Aspirin  Review of Systems   Review of Systems  Constitutional:  Negative for chills and fever.  HENT:  Negative for congestion, rhinorrhea and sore throat.   Eyes:  Negative for redness.  Respiratory:  Positive for  cough and shortness of breath.   Cardiovascular:  Positive for leg swelling. Negative for chest pain.  Gastrointestinal:  Positive for diarrhea and nausea. Negative for abdominal pain and vomiting.  Genitourinary:  Negative for dysuria, frequency, hematuria and urgency.  Musculoskeletal:  Negative for myalgias.  Skin:  Negative for rash.  Neurological:  Positive for dizziness. Negative for speech difficulty, weakness and headaches.   Physical Exam Updated Vital Signs BP (!) 153/84 (BP Location: Right Arm)   Pulse 89   Temp 98.2 F (36.8 C) (Oral)   Resp 20   Ht 5' 0.75" (1.543 m)   Wt (!) 175.5 kg   SpO2 97%   BMI 73.73 kg/m   Physical Exam Vitals and nursing note reviewed.  Constitutional:      General:  She is not in acute distress.    Appearance: She is well-developed.  HENT:     Head: Normocephalic and atraumatic.     Right Ear: External ear normal.     Left Ear: External ear normal.     Nose: Nose normal.     Mouth/Throat:     Mouth: Mucous membranes are moist.  Eyes:     Conjunctiva/sclera: Conjunctivae normal.  Cardiovascular:     Rate and Rhythm: Normal rate and regular rhythm.     Heart sounds: No murmur heard. Pulmonary:     Effort: No respiratory distress.     Breath sounds: Rales (Mild, bases) present. No wheezing or rhonchi.  Abdominal:     Palpations: Abdomen is soft.     Tenderness: There is no abdominal tenderness. There is no guarding or rebound.  Musculoskeletal:     Cervical back: Normal range of motion and neck supple.     Right lower leg: Edema present.     Left lower leg: Edema present.     Comments: 2+ lower extremity edema to the mid ankles bilaterally  Skin:    General: Skin is warm and dry.     Findings: No rash.  Neurological:     General: No focal deficit present.     Mental Status: She is alert. Mental status is at baseline.     Motor: No weakness.  Psychiatric:        Mood and Affect: Mood normal.    ED Results / Procedures / Treatments   Labs (all labs ordered are listed, but only abnormal results are displayed) Labs Reviewed  BASIC METABOLIC PANEL - Abnormal; Notable for the following components:      Result Value   Chloride 97 (*)    Glucose, Bld 111 (*)    BUN 23 (*)    All other components within normal limits  CBC - Abnormal; Notable for the following components:   WBC 11.6 (*)    All other components within normal limits  BRAIN NATRIURETIC PEPTIDE  HEPATIC FUNCTION PANEL  TROPONIN I (HIGH SENSITIVITY)    ED ECG REPORT   Date: 06/28/2021  Rate: 86  Rhythm: normal sinus rhythm  QRS Axis: normal  Intervals: normal  ST/T Wave abnormalities: normal  Conduction Disutrbances:none  Narrative Interpretation:   Old EKG  Reviewed: unchanged  I have personally reviewed the EKG tracing and agree with the computerized printout as noted.   Radiology DG Chest 2 View  Result Date: 06/28/2021 CLINICAL DATA:  Shortness of breath. EXAM: CHEST - 2 VIEW COMPARISON:  None. FINDINGS: There is vascular congestion. No focal consolidation, pleural effusion or pneumothorax. No acute osseous pathology. Degenerative changes of the  spine IMPRESSION: Vascular congestion. No focal consolidation. Electronically Signed   By: Anner Crete M.D.   On: 06/28/2021 20:46    Procedures Procedures   Medications Ordered in ED Medications - No data to display  ED Course  I have reviewed the triage vital signs and the nursing notes.  Pertinent labs & imaging results that were available during my care of the patient were reviewed by me and considered in my medical decision making (see chart for details).  Patient seen and examined. Plan discussed with patient.  Reviewed pertinent past medical history and medical records.  No previous echo in epic.  Labs: CBC, CMP, BNP.  EKG ordered.  Concern for CHF exacerbation.  Imaging: Chest x-ray.  Medications/Fluids: None  Vital signs reviewed and are as follows: BP (!) 153/84 (BP Location: Right Arm)   Pulse 89   Temp 98.2 F (36.8 C) (Oral)   Resp 20   Ht 5' 0.75" (1.543 m)   Wt (!) 175.5 kg   SpO2 97%   BMI 73.73 kg/m   10:18 PM patient rechecked.  She is stable.  Discussed results of her work-up.  Plan is to give 20 mg Lasix for the next 7 days as well as cardiology referral.  Patient does not currently have a cardiologist.  She is comfortable with discharge to home.  Encourage patient to return with worsening shortness of breath, trouble breathing, lightheadedness. Patient counseled to return if they have weakness in their arms or legs, slurred speech, trouble walking or talking, confusion, trouble with their balance, or if they have any other concerns. Patient verbalizes  understanding and agrees with plan.     MDM Rules/Calculators/A&P                           Shortness of breath/pedal edema: Worse with exertion.  Patient has worse than baseline lower extremity swelling.  Vascular congestion on x-ray but normal BNP.  Normal troponin and EKG.  No concerns for ACS currently.  No signs of pneumonia.  Patient is in no distress.  Will give 1 week course of Lasix and have patient follow-up for recheck.  Cardiology referral given.  Normal potassium and creatinine.  Dizziness: Nonspecific, no focal symptoms.  No vertigo or lightheadedness.  Low concern for CVA at this time.    Final Clinical Impression(s) / ED Diagnoses Final diagnoses:  Shortness of breath  Leg swelling    Rx / DC Orders ED Discharge Orders          Ordered    furosemide (LASIX) 20 MG tablet  Daily        06/28/21 2216             Carlisle Cater, Hershal Coria 06/28/21 2221    Luna Fuse, MD 06/28/21 2312

## 2021-07-13 ENCOUNTER — Ambulatory Visit (INDEPENDENT_AMBULATORY_CARE_PROVIDER_SITE_OTHER): Payer: 59 | Admitting: Orthopedic Surgery

## 2021-07-13 ENCOUNTER — Other Ambulatory Visit: Payer: Self-pay

## 2021-07-13 DIAGNOSIS — I87331 Chronic venous hypertension (idiopathic) with ulcer and inflammation of right lower extremity: Secondary | ICD-10-CM

## 2021-07-13 DIAGNOSIS — L97919 Non-pressure chronic ulcer of unspecified part of right lower leg with unspecified severity: Secondary | ICD-10-CM

## 2021-07-17 ENCOUNTER — Encounter: Payer: Self-pay | Admitting: Orthopedic Surgery

## 2021-07-17 NOTE — Progress Notes (Signed)
Office Visit Note   Patient: Christina Dominguez           Date of Birth: Aug 08, 1965           MRN: [id number] Visit Date: 07/13/2021              Requested by: No referring provider defined for this encounter. PCP: Pcp, No  Chief Complaint  Patient presents with   Right Leg - Follow-up    Venous insufficiency      HPI: [name] who is seen in follow-up for venous insufficiency traumatic ulcer right calf.  Patient had been wearing double extra-large compression socks but has not been wearing them recently.  Assessment & Plan: Visit Diagnoses:  1. Chronic venous hypertension (idiopathic) with ulcer and inflammation of right lower extremity (HCC)     Plan: Recommended continue with the compression socks follow-up if she has recurrent ulcer.  Follow-Up Instructions: Return if symptoms worsen or fail to improve.   Ortho Exam  Patient is alert, oriented, no adenopathy, well-dressed, normal affect, normal respiratory effort. Examination the venous ulcer right leg is completely healed there is no redness cellulitis or drainage.  Imaging: No results found. No images are attached to the encounter.  Labs: Lab Results  Component Value Date   HGBA1C 6.0 (H) 03/22/2020   HGBA1C 6.0 (H) 12/21/2019   REPTSTATUS 03/25/2021 FINAL 03/20/2021   REPTSTATUS 03/25/2021 FINAL 03/20/2021   CULT  03/20/2021    NO GROWTH 5 DAYS Performed at [facility], [place] [address] [place], [place] [address]    CULT  03/20/2021    NO GROWTH 5 DAYS Performed at [facility] [address], [place], [place] [address]      Lab Results  Component Value Date   ALBUMIN 4.2 06/28/2021   ALBUMIN 4.0 03/20/2021   ALBUMIN 4.4 03/22/2020    No results found for: MG Lab Results  Component Value Date   VD25OH 33.5 03/22/2020   VD25OH 28.6 (L) 12/21/2019    No results found for: PREALBUMIN CBC EXTENDED Latest Ref Rng & Units 06/28/2021 03/20/2021  WBC 4.0 - 10.5  K/uL 11.6(H) 9.6  RBC 3.87 - 5.11 MIL/uL 4.21 4.61  HGB 12.0 - 15.0 g/dL 12.2 13.3  HCT 36.0 - 46.0 % 38.6 42.2  PLT 150 - 400 K/uL 349 372  NEUTROABS 1.7 - 7.7 K/uL - 7.3  LYMPHSABS 0.7 - 4.0 K/uL - 1.5     There is no height or weight on file to calculate BMI.  Orders:  No orders of the defined types were placed in this encounter.  No orders of the defined types were placed in this encounter.    Procedures: No procedures performed  Clinical Data: No additional findings.  ROS:  All other systems negative, except as noted in the HPI. Review of Systems  Objective: Vital Signs: There were no vitals taken for this visit.  Specialty Comments:  No specialty comments available.  PMFS History: Patient Active Problem List   Diagnosis Date Noted   Prediabetes 12/22/2019   Vitamin D insufficiency 12/22/2019   Class 3 severe obesity with serious comorbidity and body mass index (BMI) greater than or equal to 70 in adult [facility]) 12/21/2019   Essential hypertension 04/30/2019   Mild intermittent asthma without complication 19/37/9024   Past Medical History:  Diagnosis Date   Anxiety    Asthma    Bipolar 1 disorder (HCC)    CHF (congestive heart  failure) ([place])    Depression    Edema of both lower extremities    High blood pressure    Joint pain     History reviewed. No pertinent family history.  History reviewed. No pertinent surgical history. Social History   Occupational History   Occupation: Medical illustrator, work from home  Tobacco Use   Smoking status: Never   Smokeless tobacco: Never  Scientific laboratory technician Use: Never used  Substance and Sexual Activity   Alcohol use: Yes    Comment: 1x/month   Drug use: Never   Sexual activity: Not on file

## 2021-08-02 ENCOUNTER — Other Ambulatory Visit: Payer: Self-pay

## 2021-08-02 DIAGNOSIS — J45909 Unspecified asthma, uncomplicated: Secondary | ICD-10-CM | POA: Insufficient documentation

## 2021-08-02 DIAGNOSIS — M2550 Pain in unspecified joint: Secondary | ICD-10-CM | POA: Insufficient documentation

## 2021-08-02 DIAGNOSIS — F419 Anxiety disorder, unspecified: Secondary | ICD-10-CM | POA: Insufficient documentation

## 2021-08-02 DIAGNOSIS — I5189 Other ill-defined heart diseases: Secondary | ICD-10-CM | POA: Insufficient documentation

## 2021-08-02 DIAGNOSIS — I5032 Chronic diastolic (congestive) heart failure: Secondary | ICD-10-CM | POA: Insufficient documentation

## 2021-08-02 DIAGNOSIS — I509 Heart failure, unspecified: Secondary | ICD-10-CM | POA: Insufficient documentation

## 2021-08-02 DIAGNOSIS — Z1211 Encounter for screening for malignant neoplasm of colon: Secondary | ICD-10-CM | POA: Insufficient documentation

## 2021-08-02 DIAGNOSIS — F32A Depression, unspecified: Secondary | ICD-10-CM | POA: Insufficient documentation

## 2021-08-02 DIAGNOSIS — F319 Bipolar disorder, unspecified: Secondary | ICD-10-CM | POA: Insufficient documentation

## 2021-08-02 DIAGNOSIS — R600 Localized edema: Secondary | ICD-10-CM | POA: Insufficient documentation

## 2021-08-02 HISTORY — DX: Other ill-defined heart diseases: I51.89

## 2021-08-08 NOTE — Progress Notes (Signed)
Cardiology Office Note:    Date:  08/09/2021   ID:  Christina Dominguez, DOB 06/17/1966, MRN [id number]  PCP:  Pcp, No  Cardiologist:  Shirlee More, MD   Referring MD: Luna Fuse, MD  ASSESSMENT:    1. Acute congestive heart failure, unspecified heart failure type ([place])   2. Hypertensive heart disease with heart failure ([place])   3. SOB (shortness of breath)    PLAN:    In order of problems listed above:  She recently presented with decompensated heart failure received a minimum dose of an IV diuretic 1 week ago very low-dose loop diuretic and since then has deteriorated and is back to her previous baseline prompting the ED visit I will place her on furosemide 40 mg daily check labs today including BMP proBNP and schedule an echocardiogram to define if heart failure systolic or diastolic or whether she has pulmonary artery hypertension associated with morbid obesity decisions about additional drug therapy beta-blocker Entresto spironolactone SGLT2 inhibitor will be made her next visit in 2 weeks.  She will stop her thiazide diuretic initiate low-dose valsartan for hypertension and if we transition to [facility] should allow for an easy alteration in medications.  She asked me about bariatric surgery anticipate in a few months I told her we need to see the results of treatment and her echocardiogram before decision.  She understands the importance of daily weights and sodium restriction  Next appointment 2 weeks   Medication Adjustments/Labs and Tests Ordered: Current medicines are reviewed at length with the patient today.  Concerns regarding medicines are outlined above.  Orders Placed This Encounter  Procedures   Basic metabolic panel   Pro b natriuretic peptide (BNP)   ECHOCARDIOGRAM COMPLETE   Meds ordered this encounter  Medications   furosemide (LASIX) 40 MG tablet    Sig: Take 1 tablet (40 mg total) by mouth daily.    Dispense:  90 tablet    Refill:  3   potassium chloride  (KLOR-CON) 10 MEQ tablet    Sig: Take 1 tablet (10 mEq total) by mouth daily.    Dispense:  90 tablet    Refill:  3   valsartan (DIOVAN) 40 MG tablet    Sig: Take 1 tablet (40 mg total) by mouth daily.    Dispense:  90 tablet    Refill:  3   Chief Complaint  Patient presents with   Shortness of Breath   Dizziness    History of Present Illness:    Christina Dominguez is a 56 y.o. female who is being seen today for the evaluation of heart failure at the request of Hong, Greggory Brandy, MD.   [facility] 06/28/2021.  She presented with a multitude of symptoms including being lightheaded nausea without vomiting diarrhea as well as edema shortness of breath with activity and orthopnea. The evaluation the emergency room showed a chest x-ray with vascular congestion proBNP level was low less than 20 white count was elevated 11,600 normal hemoglobin 12.2 high-sensitivity troponin checked without chest pain was normal in the EKG that day I independently reviewed showed sinus rhythm minor nonspecific ST depression otherwise normal She has felt to have heart failure received a small amount of IV furosemide 20 mg and given a prescription for 1 week furosemide 20 mg daily and discharge to outpatient care She has subsequently seen by orthopedic surgery for chronic venous insufficiency. Chart review shows no previous cardiology consultation or diagnostic  cardiac testing.  She is not taking a calcium channel blocker.  She had relocated from [facility] to [place] at the beginning of COVID pandemic Prior to that she was told she had heart failure. She had never seen a cardiologist She is not on a loop diuretic The chart is inaccurate she is not taking and has not been taking a calcium channel blocker  Is a very complicated visit involving transition of care in a patient with no primary care recently been in the emergency room decompensated heart failure presents  again today in decompensated heart failure without access to medical treatment. She has had longstanding edema shortness of breath the symptoms progressed to the point that she sought care at the med center and was treated for heart failure Initially she is improved but she only had a week of diuretic and now she has increasing edema shortness of breath and multiple orthopnea she also has episodes of PND Unfortunately she had no local healthcare and she had no follow-up in her PCP office following the ED visit. She had no referral to primary care She has no history of congenital rheumatic heart disease He has no chest pain palpitation or syncope. She has no known lung disease and is not having coughing wheezing or sputum production Past Medical History:  Diagnosis Date   Anxiety    Asthma    Bipolar 1 disorder (HCC)    CHF (congestive heart failure) (HCC)    Depression    Edema of both lower extremities    High blood pressure    Joint pain     Past Surgical History:  Procedure Laterality Date   NO PAST SURGERIES      Current Medications: Current Meds  Medication Sig   albuterol (VENTOLIN HFA) 108 (90 Base) MCG/ACT inhaler Inhale 1 puff into the lungs every 4 (four) hours as needed for wheezing or shortness of breath.   furosemide (LASIX) 40 MG tablet Take 1 tablet (40 mg total) by mouth daily.   potassium chloride (KLOR-CON) 10 MEQ tablet Take 1 tablet (10 mEq total) by mouth daily.   valsartan (DIOVAN) 40 MG tablet Take 1 tablet (40 mg total) by mouth daily.   [DISCONTINUED] triamterene-hydrochlorothiazide (MAXZIDE) 75-50 MG tablet Take 1 tablet by mouth daily.     Allergies:   Aspirin and Other   Social History   Socioeconomic History   Marital status: Single    Spouse name: Not on file   Number of children: Not on file   Years of education: Not on file   Highest education level: Not on file  Occupational History   Occupation: Medical illustrator, work from home  Tobacco  Use   Smoking status: Never   Smokeless tobacco: Never  Vaping Use   Vaping Use: Never used  Substance and Sexual Activity   Alcohol use: Yes    Comment: 1x/month   Drug use: Never   Sexual activity: Not on file  Other Topics Concern   Not on file  Social History Narrative   Not on file   Social Determinants of Health   Financial Resource Strain: Not on file  Food Insecurity: Not on file  Transportation Needs: Not on file  Physical Activity: Not on file  Stress: Not on file  Social Connections: Not on file     Family History: The patient's family history includes Bone cancer in her father; Colon cancer in her maternal grandfather; Dementia in her paternal grandmother; Diabetes in her brother;  Heart Problems in her mother; Heart attack in her sister; Hypercholesterolemia in her mother; Hypertension in her mother; Testicular cancer in her father.  ROS:   ROS Please see the history of present illness.     All other systems reviewed and are negative.  EKGs/Labs/Other Studies Reviewed:     Recent Labs: 06/28/2021: ALT 15; B Natriuretic Peptide 19.6; BUN 23; Creatinine, Ser 0.97; Hemoglobin 12.2; Platelets 349; Potassium 4.2; Sodium 136  Recent Lipid Panel    Component Value Date/Time   CHOL 158 12/21/2019 1350   TRIG 58 12/21/2019 1350   HDL 58 12/21/2019 1350   LDLCALC 88 12/21/2019 1350    Physical Exam:    VS:  BP (!) 142/83 (BP Location: Right Arm, Patient Position: Sitting, Cuff Size: Normal)    Pulse 77    Ht 5' 0.75" (1.543 m)    Wt (!) 385 lb (174.6 kg)    SpO2 94%    BMI 73.35 kg/m     Wt Readings from Last 3 Encounters:  08/09/21 (!) 385 lb (174.6 kg)  06/28/21 (!) 387 lb (175.5 kg)  03/22/20 (!) 369 lb (167.4 kg)     GEN:  Well nourished, well developed in no acute distress HEENT: Normal NECK: No JVD; No carotid bruits LYMPHATICS: No lymphadenopathy CARDIAC: RRR, no murmurs, rubs, gallops RESPIRATORY:  Clear to auscultation without rales, wheezing  or rhonchi  ABDOMEN: Soft, non-tender, non-distended MUSCULOSKELETAL:  3-4+ bilateral pitting edema; No deformity  SKIN: Warm and dry NEUROLOGIC:  Alert and oriented x 3 PSYCHIATRIC:  Normal affect     Signed, Shirlee More, MD  08/09/2021 6:16 PM    [facility]

## 2021-08-09 ENCOUNTER — Ambulatory Visit (INDEPENDENT_AMBULATORY_CARE_PROVIDER_SITE_OTHER): Payer: Managed Care, Other (non HMO) | Admitting: Cardiology

## 2021-08-09 ENCOUNTER — Other Ambulatory Visit: Payer: Self-pay

## 2021-08-09 ENCOUNTER — Encounter: Payer: Self-pay | Admitting: Cardiology

## 2021-08-09 VITALS — BP 142/83 | HR 77 | Ht 60.75 in | Wt 385.0 lb

## 2021-08-09 DIAGNOSIS — R0602 Shortness of breath: Secondary | ICD-10-CM

## 2021-08-09 DIAGNOSIS — I110 Hypertensive heart disease with heart failure: Secondary | ICD-10-CM

## 2021-08-09 DIAGNOSIS — I509 Heart failure, unspecified: Secondary | ICD-10-CM | POA: Diagnosis not present

## 2021-08-09 MED ORDER — POTASSIUM CHLORIDE ER 10 MEQ PO TBCR: 10.0000 meq | EXTENDED_RELEASE_TABLET | Freq: Every day | ORAL | 3 refills | Status: DC

## 2021-08-09 MED ORDER — FUROSEMIDE 40 MG PO TABS: 40.0000 mg | ORAL_TABLET | Freq: Every day | ORAL | 3 refills | Status: DC

## 2021-08-09 MED ORDER — VALSARTAN 40 MG PO TABS: 40.0000 mg | ORAL_TABLET | Freq: Every day | ORAL | 3 refills | Status: DC

## 2021-08-09 NOTE — Patient Instructions (Addendum)
Medication Instructions:  Your physician has recommended you make the following change in your medication:  START: Potassium 10 meq take one tablet by mouth daily.  START: Furosemide 40 mg take one tablet by mouth daily.  START: Valsartan 40 mg take one tablet by mouth daily.  STOP: Maxzide *If you need a refill on your cardiac medications before your next appointment, please call your pharmacy*   Lab Work: Your physician recommends that you return for lab work in: [place] If you have labs (blood work) drawn today and your tests are completely normal, you will receive your results only by: [place] (if you have MyChart) OR A paper copy in the mail If you have any lab test that is abnormal or we need to change your treatment, we will call you to review the results.   Testing/Procedures: Your physician has requested that you have an echocardiogram. Echocardiography is a painless test that uses sound waves to create images of your heart. It provides your doctor with information about the size and shape of your heart and how well your hearts chambers and valves are working. This procedure takes approximately one hour. There are no restrictions for this procedure.    Follow-Up: At [facility], you and your health needs are our priority.  As part of our continuing mission to provide you with exceptional heart care, we have created designated Provider Care Teams.  These Care Teams include your primary Cardiologist (physician) and Advanced Practice Providers (APPs -  Physician Assistants and Nurse Practitioners) who all work together to provide you with the care you need, when you need it.  We recommend signing up for the patient portal called "MyChart".  Sign up information is provided on this After Visit Summary.  MyChart is used to connect with patients for Virtual Visits (Telemedicine).  Patients are able to view lab/test results, encounter notes, upcoming appointments,  etc.  Non-urgent messages can be sent to your provider as well.   To learn more about what you can do with MyChart, go to [email or web address].    Your next appointment:   2 week(s)  The format for your next appointment:   In Person  Provider:   Any provider     Other Instructions  Heart Failure  Weigh yourself every morning when you first wake up and record on a calender or note pad, bring this to your office visits. Using a pill tender can help with taking your medications consistently.  Limit your fluid intake to 2 liters daily  Limit your sodium intake to less than 2-3 grams daily. Ask if you need dietary teaching.  If you gain more than 3 pounds (from your dry weight ), double your dose of diuretic for the day.  If you gain more than 5 pounds (from your dry weight), double your dose of lasix and call your heart failure doctor.  Please do not smoke tobacco since it is very bad for your heart.  Please do not drink alcohol since it can worsen your heart failure.Also avoid OTC nonsteroidal drugs, such as advil, aleve and motrin.  Try to exercise for at least 30 minutes every day because this will help your heart be more efficient. You may be eligible for supervised cardiac rehab, ask your physician.

## 2021-08-10 ENCOUNTER — Telehealth: Payer: Self-pay

## 2021-08-10 LAB — BASIC METABOLIC PANEL
BUN/Creatinine Ratio: 23 (ref 9–23)
BUN: 21 mg/dL (ref 6–24)
CO2: 29 mmol/L (ref 20–29)
Calcium: 9.5 mg/dL (ref 8.7–10.2)
Chloride: 92 mmol/L — ABNORMAL LOW (ref 96–106)
Creatinine, Ser: 0.93 mg/dL (ref 0.57–1.00)
Glucose: 94 mg/dL (ref 70–99)
Potassium: 4 mmol/L (ref 3.5–5.2)
Sodium: 139 mmol/L (ref 134–144)
eGFR: 72 mL/min/{1.73_m2} (ref >59)

## 2021-08-10 LAB — PRO B NATRIURETIC PEPTIDE: NT-Pro BNP: 106 pg/mL (ref 0–287)

## 2021-08-10 NOTE — Telephone Encounter (Signed)
Tried calling patient. No answer and no voicemail set up for me to leave a message. 

## 2021-08-10 NOTE — Telephone Encounter (Signed)
-----   Message from Richardo Priest, MD sent at 08/10/2021  8:15 AM EST ----- I placed her on a diuretic yesterday she will return in 2 weeks we should recheck a BMP level at that time

## 2021-08-10 NOTE — Telephone Encounter (Signed)
Spoke with patient regarding results and recommendation.  Patient verbalizes understanding and is agreeable to plan of care. Advised patient to call back with any issues or concerns.  

## 2021-08-18 ENCOUNTER — Emergency Department (HOSPITAL_BASED_OUTPATIENT_CLINIC_OR_DEPARTMENT_OTHER): Payer: Commercial Managed Care - HMO

## 2021-08-18 ENCOUNTER — Inpatient Hospital Stay (HOSPITAL_BASED_OUTPATIENT_CLINIC_OR_DEPARTMENT_OTHER)
Admission: EM | Admit: 2021-08-18 | Discharge: 2021-08-21 | DRG: 291 | Disposition: A | Discharge status: Home / Self Care | Payer: Commercial Managed Care - HMO | Attending: Internal Medicine | Admitting: Internal Medicine

## 2021-08-18 ENCOUNTER — Other Ambulatory Visit: Payer: Self-pay

## 2021-08-18 ENCOUNTER — Encounter (HOSPITAL_BASED_OUTPATIENT_CLINIC_OR_DEPARTMENT_OTHER): Payer: Self-pay

## 2021-08-18 ENCOUNTER — Telehealth: Payer: Self-pay | Admitting: Cardiology

## 2021-08-18 DIAGNOSIS — Z6841 Body Mass Index (BMI) 40.0 and over, adult: Secondary | ICD-10-CM

## 2021-08-18 DIAGNOSIS — Z8249 Family history of ischemic heart disease and other diseases of the circulatory system: Secondary | ICD-10-CM

## 2021-08-18 DIAGNOSIS — F419 Anxiety disorder, unspecified: Secondary | ICD-10-CM | POA: Diagnosis present

## 2021-08-18 DIAGNOSIS — I10 Essential (primary) hypertension: Secondary | ICD-10-CM | POA: Diagnosis present

## 2021-08-18 DIAGNOSIS — J45909 Unspecified asthma, uncomplicated: Secondary | ICD-10-CM | POA: Diagnosis present

## 2021-08-18 DIAGNOSIS — I110 Hypertensive heart disease with heart failure: Secondary | ICD-10-CM | POA: Diagnosis not present

## 2021-08-18 DIAGNOSIS — R1011 Right upper quadrant pain: Secondary | ICD-10-CM | POA: Diagnosis present

## 2021-08-18 DIAGNOSIS — R109 Unspecified abdominal pain: Secondary | ICD-10-CM

## 2021-08-18 DIAGNOSIS — Z833 Family history of diabetes mellitus: Secondary | ICD-10-CM

## 2021-08-18 DIAGNOSIS — I700 Atherosclerosis of aorta: Secondary | ICD-10-CM | POA: Diagnosis present

## 2021-08-18 DIAGNOSIS — Z79899 Other long term (current) drug therapy: Secondary | ICD-10-CM

## 2021-08-18 DIAGNOSIS — I509 Heart failure, unspecified: Secondary | ICD-10-CM

## 2021-08-18 DIAGNOSIS — R160 Hepatomegaly, not elsewhere classified: Secondary | ICD-10-CM | POA: Diagnosis present

## 2021-08-18 DIAGNOSIS — J9601 Acute respiratory failure with hypoxia: Secondary | ICD-10-CM | POA: Diagnosis present

## 2021-08-18 DIAGNOSIS — K760 Fatty (change of) liver, not elsewhere classified: Secondary | ICD-10-CM | POA: Diagnosis present

## 2021-08-18 DIAGNOSIS — Z91012 Allergy to eggs: Secondary | ICD-10-CM

## 2021-08-18 DIAGNOSIS — R0609 Other forms of dyspnea: Secondary | ICD-10-CM

## 2021-08-18 DIAGNOSIS — I5033 Acute on chronic diastolic (congestive) heart failure: Secondary | ICD-10-CM | POA: Diagnosis present

## 2021-08-18 DIAGNOSIS — F319 Bipolar disorder, unspecified: Secondary | ICD-10-CM | POA: Diagnosis present

## 2021-08-18 DIAGNOSIS — Z886 Allergy status to analgesic agent status: Secondary | ICD-10-CM

## 2021-08-18 DIAGNOSIS — R7303 Prediabetes: Secondary | ICD-10-CM | POA: Diagnosis present

## 2021-08-18 DIAGNOSIS — I5189 Other ill-defined heart diseases: Secondary | ICD-10-CM

## 2021-08-18 DIAGNOSIS — Z20822 Contact with and (suspected) exposure to covid-19: Secondary | ICD-10-CM | POA: Diagnosis present

## 2021-08-18 LAB — COMPREHENSIVE METABOLIC PANEL
ALT: 17 U/L (ref 0–44)
AST: 20 U/L (ref 15–41)
Albumin: 3.9 g/dL (ref 3.5–5.0)
Alkaline Phosphatase: 97 U/L (ref 38–126)
Anion gap: 9 (ref 5–15)
BUN: 22 mg/dL — ABNORMAL HIGH (ref 6–20)
CO2: 28 mmol/L (ref 22–32)
Calcium: 8.9 mg/dL (ref 8.9–10.3)
Chloride: 99 mmol/L (ref 98–111)
Creatinine, Ser: 0.84 mg/dL (ref 0.44–1.00)
GFR, Estimated: >60 mL/min (ref >60)
Glucose, Bld: 93 mg/dL (ref 70–99)
Potassium: 3.7 mmol/L (ref 3.5–5.1)
Sodium: 136 mmol/L (ref 135–145)
Total Bilirubin: 0.6 mg/dL (ref 0.3–1.2)
Total Protein: 8.2 g/dL — ABNORMAL HIGH (ref 6.5–8.1)

## 2021-08-18 LAB — CBC WITH DIFFERENTIAL/PLATELET
Abs Immature Granulocytes: 0.04 10*3/uL (ref 0.00–0.07)
Basophils Absolute: 0.1 10*3/uL (ref 0.0–0.1)
Basophils Relative: 1 %
Eosinophils Absolute: 0.2 10*3/uL (ref 0.0–0.5)
Eosinophils Relative: 2 %
HCT: 36.6 % (ref 36.0–46.0)
Hemoglobin: 11.7 g/dL — ABNORMAL LOW (ref 12.0–15.0)
Immature Granulocytes: 0 %
Lymphocytes Relative: 17 %
Lymphs Abs: 1.6 10*3/uL (ref 0.7–4.0)
MCH: 29 pg (ref 26.0–34.0)
MCHC: 32 g/dL (ref 30.0–36.0)
MCV: 90.8 fL (ref 80.0–100.0)
Monocytes Absolute: 0.6 10*3/uL (ref 0.1–1.0)
Monocytes Relative: 6 %
Neutro Abs: 7 10*3/uL (ref 1.7–7.7)
Neutrophils Relative %: 74 %
Platelets: 348 10*3/uL (ref 150–400)
RBC: 4.03 MIL/uL (ref 3.87–5.11)
RDW: 14.3 % (ref 11.5–15.5)
WBC: 9.4 10*3/uL (ref 4.0–10.5)
nRBC: 0 % (ref 0.0–0.2)

## 2021-08-18 LAB — URINALYSIS, MICROSCOPIC (REFLEX)

## 2021-08-18 LAB — URINALYSIS, ROUTINE W REFLEX MICROSCOPIC
Bilirubin Urine: NEGATIVE
Glucose, UA: NEGATIVE mg/dL
Ketones, ur: NEGATIVE mg/dL
Leukocytes,Ua: NEGATIVE
Nitrite: NEGATIVE
Protein, ur: NEGATIVE mg/dL
Specific Gravity, Urine: 1.01 (ref 1.005–1.030)
pH: 5.5 (ref 5.0–8.0)

## 2021-08-18 LAB — RESP PANEL BY RT-PCR (FLU A&B, COVID) ARPGX2
Influenza A by PCR: NEGATIVE
Influenza B by PCR: NEGATIVE
SARS Coronavirus 2 by RT PCR: NEGATIVE

## 2021-08-18 LAB — BRAIN NATRIURETIC PEPTIDE: B Natriuretic Peptide: 25.3 pg/mL (ref 0.0–100.0)

## 2021-08-18 LAB — TROPONIN I (HIGH SENSITIVITY): Troponin I (High Sensitivity): 4 ng/L (ref <18)

## 2021-08-18 MED ORDER — IRBESARTAN 75 MG PO TABS: 37.5000 mg | ORAL_TABLET | Freq: Every day | ORAL | Status: DC

## 2021-08-18 MED ORDER — FUROSEMIDE 10 MG/ML IJ SOLN
40.0000 mg | Freq: Once | INTRAMUSCULAR | Status: AC
  Administered 2021-08-18: 40 mg via INTRAVENOUS
  Filled 2021-08-18: qty 4

## 2021-08-18 MED ORDER — IRBESARTAN 75 MG PO TABS
37.5000 mg | ORAL_TABLET | Freq: Every day | ORAL | Status: DC
  Administered 2021-08-20 – 2021-08-21 (×2): 37.5 mg via ORAL
  Filled 2021-08-18 (×4): qty 0.5

## 2021-08-18 MED ORDER — ACETAMINOPHEN 325 MG PO TABS
650.0000 mg | ORAL_TABLET | Freq: Once | ORAL | Status: AC
Start: 2021-08-18 — End: 2021-08-18
  Administered 2021-08-18: 650 mg via ORAL
  Filled 2021-08-18: qty 2

## 2021-08-18 MED ORDER — ALBUTEROL SULFATE HFA 108 (90 BASE) MCG/ACT IN AERS: 1.0000 | INHALATION_SPRAY | RESPIRATORY_TRACT | Status: DC | PRN

## 2021-08-18 MED ORDER — POTASSIUM CHLORIDE ER 10 MEQ PO TBCR
10.0000 meq | EXTENDED_RELEASE_TABLET | Freq: Every day | ORAL | Status: DC
  Administered 2021-08-20 – 2021-08-21 (×2): 10 meq via ORAL
  Filled 2021-08-18 (×5): qty 1

## 2021-08-18 NOTE — Progress Notes (Signed)
HOSPITAL MEDICINE OVERNIGHT EVENT NOTE    56 year old female who has recently moved to the area from [place] who reports a history of congestive heart failure although further details are not available.  Patient has recently established care with Dr. Bettina Gavia with cardiology but has yet to undergo an outpatient echocardiogram.    Patient presents to [facility] ED with complaints of weight gain and dyspnea on exertion.  Patient found to be intermittently hypoxic desaturating into the 80s with any amount of exertion.  While chest x-ray does not reveal any overt pulmonary edema and BNP is unremarkable, ER provider is clinical concern for acute congestive heart failure as well as outpatient cardiology when patient was seen in the clinic earlier in the month.    Due to some abdominal tenderness on ER provider exam CT imaging of the abdomen and pelvis was performed revealing mild wall thickening of the proximal duodenum that was essentially negative with exception of some questionable duodenitis.  Patient has been administered 40 mg of intravenous Lasix resulting in over 1.5 L of urine output thus far.  Bed request placed for medical telemetry bed at [facility].  Vernelle Emerald  MD [facility]

## 2021-08-18 NOTE — ED Provider Notes (Signed)
[facility] EMERGENCY DEPARTMENT Provider Note   CSN: [id number] Arrival date & time: 08/18/21  1107     History  Chief Complaint  Patient presents with   Abdominal Pain    Christina Dominguez is a 56 y.o. female with pertinent past medical history of congestive heart failure and hypertension who presents with complaint of worsening abdominal pain over the last 5 days as well as worsening shortness of breath.  The abdominal pain is dull and constant in nature, at times rating to the back.  She denies any aggravating or alleviating factors.  She has not had abdominal surgery and has never been diagnosed with conditions related to her gastrointestinal tract.  She is having regular bowel movements and passing gas, and has no urinary symptoms.  She is tolerating p.o. intake at this time.  Patient also endorses increasing shortness of breath requiring frequent stops while attempting to ambulate and easy fatigability.  She has required increasing head elevation to sleep at night.  She denies feeling that her abdomen or extremities are more swollen than normal. She reports diffuse tenderness to palpation over her abdomen, and worse over the suprapubic area and right lower quadrant.  She denies recent fever, chills, sick exposure.  She does endorse some difficulty with collecting her thoughts/confusion.     Home Medications Prior to Admission medications   Medication Sig Start Date End Date Taking? Authorizing Provider  albuterol (VENTOLIN HFA) 108 (90 Base) MCG/ACT inhaler Inhale 1 puff into the lungs every 4 (four) hours as needed for wheezing or shortness of breath.    [provider]  furosemide (LASIX) 40 MG tablet Take 1 tablet (40 mg total) by mouth daily. 08/09/21 11/07/21  Richardo Priest, MD  potassium chloride (KLOR-CON) 10 MEQ tablet Take 1 tablet (10 mEq total) by mouth daily. 08/09/21 11/07/21  Richardo Priest, MD  valsartan (DIOVAN) 40 MG tablet Take 1 tablet (40 mg total)  by mouth daily. 08/09/21   Richardo Priest, MD      Allergies    Aspirin and Other    Review of Systems   Review of Systems  Constitutional:  Positive for activity change.  Respiratory:  Negative for cough and shortness of breath.   Cardiovascular:  Negative for chest pain.  Gastrointestinal:  Positive for abdominal pain. Negative for abdominal distention, blood in stool, constipation, diarrhea, nausea and vomiting.  Genitourinary:  Positive for flank pain. Negative for difficulty urinating, dysuria, frequency and urgency.  Neurological:  Positive for weakness.  Psychiatric/Behavioral:  Positive for decreased concentration.    Physical Exam Updated Vital Signs BP 129/77    Pulse 76    Temp 98 F (36.7 C) (Oral)    Resp (!) 22    SpO2 98%  Constitutional: No acute distress noted. Cardio: Regular rate and rhythm.  No murmurs, rubs, gallops. Pulm: Clear to auscultation bilaterally.  Normal work of breathing on room air. Abdomen: Soft, nondistended.  Tender over suprapubic area, right lower quadrant, right upper quadrant. MSK: Bilateral lower extremity nonpitting edema. Skin: Skin is warm and dry. Neuro: Alert and oriented x3.  No focal deficit noted. Psych: Normal mood and affect.  ED Results / Procedures / Treatments   Labs (all labs ordered are listed, but only abnormal results are displayed) Labs Reviewed  CBC WITH DIFFERENTIAL/PLATELET - Abnormal; Notable for the following components:      Result Value   Hemoglobin 11.7 (*)    All other components within normal limits  URINALYSIS, ROUTINE W REFLEX MICROSCOPIC - Abnormal; Notable for the following components:   Hgb urine dipstick TRACE (*)    All other components within normal limits  COMPREHENSIVE METABOLIC PANEL - Abnormal; Notable for the following components:   BUN 22 (*)    Total Protein 8.2 (*)    All other components within normal limits  URINALYSIS, MICROSCOPIC (REFLEX) - Abnormal; Notable for the following  components:   Bacteria, UA RARE (*)    All other components within normal limits  BRAIN NATRIURETIC PEPTIDE  TROPONIN I (HIGH SENSITIVITY)  TROPONIN I (HIGH SENSITIVITY)    EKG EKG Interpretation  Date/Time:  Friday August 18 2021 12:52:47 EST Ventricular Rate:  75 PR Interval:  176 QRS Duration: 90 QT Interval:  425 QTC Calculation: 475 R Axis:   60 Text Interpretation: Sinus rhythm No significant change since last tracing Confirmed by Blanchie Dessert [phone]) on 08/18/2021 3:04:31 PM  Radiology DG Chest 2 View  Result Date: 08/18/2021 CLINICAL DATA:  Shortness of breath. EXAM: CHEST - 2 VIEW COMPARISON:  June 28, 2021. FINDINGS: Stable cardiomegaly with mild central pulmonary vascular congestion. No consolidative process is noted. The visualized skeletal structures are unremarkable. IMPRESSION: Stable cardiomegaly with mild central pulmonary vascular congestion. Electronically Signed   By: Marijo Conception M.D.   On: 08/18/2021 13:23    Procedures None  Medications Ordered in ED Medications  furosemide (LASIX) injection 40 mg (has no administration in time range)    ED Course/ Medical Decision Making/ A&P   Patient presents with several complaints concerning for congestive heart failure exacerbation as well as some sort of gastrointestinal ailment.  She has had worsening dyspnea on exertion requiring more frequent stops, increased fatigue, worsening orthopnea requiring increased had elevation from baseline.  She also endorses abdominal pain over the last 5 days that is dull and constant in nature with occasional radiation to the back.  Physical exam shows suprapubic, right lower quadrant, and right upper quadrant tenderness to palpation.  Fortunately patient is still having bowel movements, passing gas, and denies any hematochezia.  No nausea or vomiting.  She is afebrile and hemodynamically stable.  Differential regarding respiratory complaints includes congestive heart  failure exacerbation, ACS.  Differential regarding gastrointestinal complaints includes cholecystitis, diverticulitis, appendicitis.  At this time will obtain CBC, CMP, urinalysis, BNP, troponin, EKG, chest x-ray.  It is likely that she will also be indicated for CT abdomen/pelvis.  1329: CBC, CMP overall unremarkable.  UA with trace hemoglobin and rare bacteria.  Chest x-ray showed stable cardiomegaly with mild pulmonary vasculature congestion.  1415: Troponin negative, BNP negative.  1441: CT abdomen/pelvis showed mild wall thickening of the proximal duodenum (artifact versus nonspecific duodenitis), colonic diverticulosis without findings of acute diverticulitis, hepatomegaly with probable diffuse hepatic steatosis, aortic atherosclerosis.  1524: Patient walked to restroom and had desaturations into the upper-80%s with dyspnea. We will trial a dose of IV Lasix 40 mg and reevaluate.  Patient has been signed out to oncoming physician who will continue her evaluation and care.  Final Clinical Impression(s) / ED Diagnoses Final diagnoses:  None    Rx / DC Orders ED Discharge Orders     None         Farrel Gordon, DO 08/18/21 [place], MD 08/19/21 [phone]

## 2021-08-18 NOTE — ED Triage Notes (Signed)
Pt c/o lower abd pain, fatigue, "trouble getting my thoughts together" sx started 5 days ago-NAD-to triage in w/c

## 2021-08-18 NOTE — Telephone Encounter (Signed)
Patient is calling reporting she is on the way to urgent care due to extreme fatigue and having a hard time putting her words together and collecting her thoughts. Reports no others symptoms.

## 2021-08-18 NOTE — ED Provider Notes (Signed)
Signout from Dr. Maryan Rued.  56 year old female history of CHF here with vague abdominal pain and worsening shortness of breath.  Did desat with exertion.  Getting IV diuresis.  Plan is to reassess after diuresis.  Either will need admission for CHF exacerbation or can be discharged and increase Lasix dose. Physical Exam  BP (!) 142/87    Pulse 76    Temp 98 F (36.7 C) (Oral)    Resp 20    SpO2 99%   Physical Exam  Procedures  Procedures  ED Course / MDM    Medical Decision Making She diuresed almost 2000 mL although still is dyspneic with any type of exertion.  Discussed with Dr. Barton Fanny hospitalist who is accepting the patient for admission.        Christina Rasmussen, MD 08/19/21 [phone]

## 2021-08-19 ENCOUNTER — Encounter (HOSPITAL_COMMUNITY): Payer: Self-pay | Admitting: Internal Medicine

## 2021-08-19 ENCOUNTER — Observation Stay (HOSPITAL_COMMUNITY): Payer: Commercial Managed Care - HMO

## 2021-08-19 DIAGNOSIS — I700 Atherosclerosis of aorta: Secondary | ICD-10-CM

## 2021-08-19 DIAGNOSIS — K760 Fatty (change of) liver, not elsewhere classified: Secondary | ICD-10-CM

## 2021-08-19 DIAGNOSIS — K5790 Diverticulosis of intestine, part unspecified, without perforation or abscess without bleeding: Secondary | ICD-10-CM

## 2021-08-19 DIAGNOSIS — R160 Hepatomegaly, not elsewhere classified: Secondary | ICD-10-CM

## 2021-08-19 DIAGNOSIS — I509 Heart failure, unspecified: Secondary | ICD-10-CM

## 2021-08-19 DIAGNOSIS — R1011 Right upper quadrant pain: Secondary | ICD-10-CM | POA: Diagnosis not present

## 2021-08-19 HISTORY — DX: Fatty (change of) liver, not elsewhere classified: K76.0

## 2021-08-19 HISTORY — DX: Diverticulosis of intestine, part unspecified, without perforation or abscess without bleeding: K57.90

## 2021-08-19 HISTORY — DX: Hepatomegaly, not elsewhere classified: R16.0

## 2021-08-19 HISTORY — DX: Atherosclerosis of aorta: I70.0

## 2021-08-19 LAB — GLUCOSE, CAPILLARY
Glucose-Capillary: 131 mg/dL — ABNORMAL HIGH (ref 70–99)
Glucose-Capillary: 129 mg/dL — ABNORMAL HIGH (ref 70–99)

## 2021-08-19 MED ORDER — PANTOPRAZOLE SODIUM 40 MG PO TBEC
40.0000 mg | DELAYED_RELEASE_TABLET | Freq: Every day | ORAL | Status: DC
  Administered 2021-08-19 – 2021-08-21 (×3): 40 mg via ORAL
  Filled 2021-08-19 (×3): qty 1

## 2021-08-19 MED ORDER — ONDANSETRON HCL 4 MG/2ML IJ SOLN: 4.0000 mg | Freq: Four times a day (QID) | INTRAMUSCULAR | Status: DC | PRN

## 2021-08-19 MED ORDER — FUROSEMIDE 10 MG/ML IJ SOLN
40.0000 mg | Freq: Two times a day (BID) | INTRAMUSCULAR | Status: DC
  Administered 2021-08-19 – 2021-08-21 (×4): 40 mg via INTRAVENOUS
  Filled 2021-08-19 (×4): qty 4

## 2021-08-19 MED ORDER — MELATONIN 5 MG PO TABS
5.0000 mg | ORAL_TABLET | Freq: Once | ORAL | Status: AC
  Administered 2021-08-19: 5 mg via ORAL
  Filled 2021-08-19: qty 1

## 2021-08-19 MED ORDER — ACETAMINOPHEN 650 MG RE SUPP: 650.0000 mg | Freq: Four times a day (QID) | RECTAL | Status: DC | PRN

## 2021-08-19 MED ORDER — ONDANSETRON HCL 4 MG PO TABS: 4.0000 mg | ORAL_TABLET | Freq: Four times a day (QID) | ORAL | Status: DC | PRN

## 2021-08-19 MED ORDER — ACETAMINOPHEN 325 MG PO TABS: 650.0000 mg | ORAL_TABLET | Freq: Four times a day (QID) | ORAL | Status: DC | PRN

## 2021-08-19 MED ORDER — ENOXAPARIN SODIUM 80 MG/0.8ML IJ SOSY
80.0000 mg | PREFILLED_SYRINGE | INTRAMUSCULAR | Status: DC
  Administered 2021-08-19 – 2021-08-20 (×2): 80 mg via SUBCUTANEOUS
  Filled 2021-08-19 (×2): qty 0.8

## 2021-08-19 NOTE — ED Notes (Signed)
Patient complains of severe leg cramps.  Ambulating in hallway with cane.  She states walking helps with the leg cramps.

## 2021-08-19 NOTE — ED Notes (Addendum)
Pt took dose of home 29meq potassium and 40mg  valsartan as ordered am medications not in stock at this location.

## 2021-08-19 NOTE — ED Notes (Signed)
[facility] at bedside 

## 2021-08-19 NOTE — H&P (Signed)
History and Physical    Christina Dominguez:[id number] DOB: 08-05-1966 DOA: 08/18/2021  PCP: Pcp, No   Patient coming from: Home.  I have personally briefly reviewed patient's old medical records in [place]  Chief Complaint: Shortness of breath.  HPI: Christina Dominguez is a 56 y.o. female with medical history significant of anxiety, asthma, bipolar 1 disorder, diastolic dysfunction, depression, chronic lower extremity edema, hypertension, joint pain, class III obesity with a BMI of 72.73 kg/m who is coming to the emergency department due to progressively worse dyspnea for the past 2 weeks associated with lower extremity edema for the past week or so.  She has also had orthopnea, PND and postural dizziness.  No chest pain, palpitations, diaphoresis or palpitations.  She has also been having right upper quadrant abdominal pain for the past few days that gets worse with deep inspiration.  She has chronic diarrhea and usually has 2-3 loose BMs daily.  No constipation, melena or hematochezia.  No dysuria, frequency or hematuria.  No polyuria, polydipsia, polyphagia or blurred vision.  ED Course: Initial vital signs were temperature 98 F, pulse 77, respiration 17, BP 154/90 mmHg O2 sat 99% on room air.  The patient received acetaminophen and 40 mg of furosemide IVP.  Lab work: Her urinalysis showed trace hemoglobinuria but was otherwise unremarkable.  CBC with a white count 9.4, hemoglobin 11.7 g/dL and platelets 348.  BMP with a BUN was 22 mg/dL but otherwise unremarkable.  Imaging: A 2 view chest radiograph series showed stable cardiomegaly with mild central pulmonary vascular congestion.  CT abdomen pelvis without contrast showed mild wall thickening of the proximal duodenum, which may represent artifact versus nonspecific duodenitis.  There is diverticulosis without diverticulitis, hepatomegaly with probable diffuse hepatic steatosis.  There was aortic atherosclerosis.  Review of  Systems: As per HPI otherwise all other systems reviewed and are negative.  Past Medical History:  Diagnosis Date   Anxiety    Asthma    Bipolar 1 disorder (HCC)    CHF (congestive heart failure) (HCC)    Depression    Edema of both lower extremities    High blood pressure    Joint pain    Past Surgical History:  Procedure Laterality Date   NO PAST SURGERIES     Social History  reports that she has never smoked. She has never used smokeless tobacco. She reports current alcohol use. She reports that she does not use drugs.  Allergies  Allergen Reactions   Aspirin    Other Rash    Pt states she is allergic to eggs and flu vaccine   Family History  Problem Relation Age of Onset   Heart Problems Mother        Broken heart syndrome   Hypertension Mother    Hypercholesterolemia Mother    Bone cancer Father    Testicular cancer Father    Heart attack Sister    Diabetes Brother    Colon cancer Maternal Grandfather    Dementia Paternal Grandmother    Prior to Admission medications   Medication Sig Start Date End Date Taking? Authorizing Provider  albuterol (VENTOLIN HFA) 108 (90 Base) MCG/ACT inhaler Inhale 1 puff into the lungs every 4 (four) hours as needed for wheezing or shortness of breath.    [provider]  furosemide (LASIX) 40 MG tablet Take 1 tablet (40 mg total) by mouth daily. 08/09/21 11/07/21  Richardo Priest, MD  potassium chloride (KLOR-CON) 10 MEQ tablet Take  1 tablet (10 mEq total) by mouth daily. 08/09/21 11/07/21  Richardo Priest, MD  valsartan (DIOVAN) 40 MG tablet Take 1 tablet (40 mg total) by mouth daily. 08/09/21   Richardo Priest, MD   Physical Exam: Vitals:   08/19/21 1015 08/19/21 1123 08/19/21 1334 08/19/21 1335  BP: 123/82  135/85 135/85  Pulse: 79  86 84  Resp: 18  20   Temp:  97.8 F (36.6 C) 97.6 F (36.4 C) 97.6 F (36.4 C)  TempSrc:  Oral Oral Oral  SpO2: 98%  99% 99%  Weight:      Height:       Constitutional: NAD, calm,  comfortable Eyes: PERRL, lids and conjunctivae normal ENMT: Mucous membranes are moist. Posterior pharynx clear of any exudate or lesions. Neck: normal, supple, no masses, no thyromegaly Respiratory: No wheezing, bibasilar crackles. Normal respiratory effort. No accessory muscle use.  Cardiovascular: Regular rate and rhythm, no murmurs / rubs / gallops.  Stage II lymphedema.  1+ lower extremity pitting edema. 2+ pedal pulses. No carotid bruits.  Abdomen: Obese, no distention.  Soft, positive RUQ tenderness, no guarding or rebound, no masses palpated.  Unable to palpate for hepatosplenomegaly due to body habitus. Bowel sounds positive.  Musculoskeletal: no clubbing / cyanosis.  Mild to moderate generalized weakness. Good ROM, no contractures. Normal muscle tone.  Skin: no acute rashes, lesions, ulcers on limited dermatological semination. Neurologic: CN 2-12 grossly intact. Sensation intact, DTR normal. Strength 5/5 in all 4.  Psychiatric: Normal judgment and insight. Alert and oriented x 3. Normal.  Labs on Admission: I have personally reviewed following labs and imaging studies  CBC: Recent Labs  Lab 08/18/21 1253  WBC 9.4  NEUTROABS 7.0  HGB 11.7*  HCT 36.6  MCV 90.8  PLT 867    Basic Metabolic Panel: Recent Labs  Lab 08/18/21 1253  NA 136  K 3.7  CL 99  CO2 28  GLUCOSE 93  BUN 22*  CREATININE 0.84  CALCIUM 8.9    GFR: Estimated Creatinine Clearance: 116.3 mL/min (by C-G formula based on SCr of 0.84 mg/dL).  Liver Function Tests: Recent Labs  Lab 08/18/21 1253  AST 20  ALT 17  ALKPHOS 97  BILITOT 0.6  PROT 8.2*  ALBUMIN 3.9    Urine analysis:    Component Value Date/Time   COLORURINE YELLOW 08/18/2021 [place] 08/18/2021 1253   LABSPEC 1.010 08/18/2021 1253   PHURINE 5.5 08/18/2021 1253   GLUCOSEU NEGATIVE 08/18/2021 1253   HGBUR TRACE (A) 08/18/2021 1253   BILIRUBINUR NEGATIVE 08/18/2021 1253   [place] 08/18/2021 1253    PROTEINUR NEGATIVE 08/18/2021 1253   NITRITE NEGATIVE 08/18/2021 1253   LEUKOCYTESUR NEGATIVE 08/18/2021 1253    Radiological Exams on Admission: CT ABDOMEN PELVIS WO CONTRAST  Result Date: 08/18/2021 CLINICAL DATA:  Right lower quadrant abdominal pain. So the real indication EXAM: CT ABDOMEN AND PELVIS WITHOUT CONTRAST TECHNIQUE: Multidetector CT imaging of the abdomen and pelvis was performed following the standard protocol without IV contrast. RADIATION DOSE REDUCTION: This exam was performed according to the departmental dose-optimization program which includes automated exposure control, adjustment of the mA and/or kV according to patient size and/or use of iterative reconstruction technique. COMPARISON:  None. FINDINGS: Study is degraded by photon attenuation related to patient habitus. Lower chest: No acute abnormality. Hepatobiliary: Hepatomegaly measuring 19.7 cm in maximum craniocaudal dimension at the midclavicular line. Probable hepatic steatosis. Gallbladder is unremarkable. No biliary ductal dilation. Pancreas: No pancreatic  ductal dilation. Spleen: Normal in size without focal abnormality. Adrenals/Urinary Tract: Bilateral adrenal glands appear normal. No hydronephrosis. No renal, ureteral or bladder calculi identified. Stomach/Bowel: No enteric contrast was administered. Stomach is decompressed limiting evaluation. Mild wall thickening of the proximal duodenum. No pathologic dilation of large or small bowel. Colonic diverticulosis without findings of acute diverticulitis. Vascular/Lymphatic: Scattered aortic atherosclerosis without aneurysmal dilation. No pathologically enlarged abdominal or pelvic lymph nodes. Reproductive: Uterus and bilateral adnexa are unremarkable. Other: No abdominopelvic free fluid. Musculoskeletal: Multilevel degenerative changes spine. No acute osseous abnormality. IMPRESSION: Study is degraded by photon attenuation related to patient habitus. Within this context: 1.  Mild wall thickening of the proximal duodenum, which may represent artifact versus nonspecific duodenitis. 2. Colonic diverticulosis without findings of acute diverticulitis. 3. Hepatomegaly with probable diffuse hepatic steatosis. 4.  Aortic Atherosclerosis (ICD10-I70.0). Electronically Signed   By: Dahlia Bailiff M.D.   On: 08/18/2021 14:13   DG Chest 2 View  Result Date: 08/18/2021 CLINICAL DATA:  Shortness of breath. EXAM: CHEST - 2 VIEW COMPARISON:  June 28, 2021. FINDINGS: Stable cardiomegaly with mild central pulmonary vascular congestion. No consolidative process is noted. The visualized skeletal structures are unremarkable. IMPRESSION: Stable cardiomegaly with mild central pulmonary vascular congestion. Electronically Signed   By: Marijo Conception M.D.   On: 08/18/2021 13:23    EKG: Independently reviewed.  Vent. rate 75 BPM PR interval 176 ms QRS duration 90 ms QT/QTcB 425/475 ms P-R-T axes 58 60 63 Sinus rhythm  Assessment/Plan Principal Problem:   Acute congestive heart failure (HCC) Likely due to   Diastolic dysfunction However will need echocardiogram to further specify. Will place in observation/telemetry in the meantime. Continue supplemental oxygen as needed. Fluid and sodium restriction. Monitor daily weights, intake and output. Continue furosemide 40 mg IVP twice daily Continue valsartan 40 mg p.o. daily or formulary equivalent. Increase ARB as tolerated. Monitor renal function electrolytes. Replace electrolytes as needed. Low-dose beta-blocker after acute phase. Check echocardiogram in a.m.  Active Problems:   Essential hypertension Continue ARB. Follow-up BP, renal function electrolytes.    Class 3 severe obesity with serious comorbidity    and body mass index (BMI) greater than or equal to 70 in adult [facility]) Lifestyle modifications. She is planning on bariatric surgery. Follow-up with PCP.    Prediabetes CBG monitoring before meals and bedtime.     Hepatic steatosis with hepatomegaly Check RUQ [place]. Weight loss.    Asthma Supplemental oxygen as needed. Bronchodilators as needed.    Aortic atherosclerosis May benefit from statin therapy. Will not begin now given RUQ pain and hepatomegaly.   DVT prophylaxis: Lovenox SQ. Code Status:   Full code. Family Communication:   Disposition Plan:   Patient is from:  Home.  Anticipated DC to:  Home.  Anticipated DC date:  08/20/2021 or 08/21/2021.  Anticipated DC barriers: Clinical status/pending echo.  Consults called:   Admission status:  Observation/telemetry.  Severity of Illness: High severity in the setting of volume overload likely due to unspecified heart failure (pending echo).    Reubin Milan MD [facility]  How to contact the [facility] Attending or Consulting provider [place] or covering provider during after hours [place], for this patient?   Check the care team in [facility] and look for a) attending/consulting [facility] provider listed and b) the [facility] team listed Log into [email or web address] and use [place]'s universal password to access. If you do not have the password, please contact the hospital operator. Locate  the [facility] provider you are looking for under [facility] and page to a number that you can be directly reached. If you still have difficulty reaching the provider, please page the [facility] (Director on Call) for the Hospitalists listed on amion for assistance.  08/19/2021, 2:05 PM   This document was prepared using Dragon voice recognition software and may contain some unintended transcription errors.

## 2021-08-20 ENCOUNTER — Observation Stay (HOSPITAL_COMMUNITY): Payer: Commercial Managed Care - HMO

## 2021-08-20 DIAGNOSIS — K760 Fatty (change of) liver, not elsewhere classified: Secondary | ICD-10-CM | POA: Diagnosis present

## 2021-08-20 DIAGNOSIS — R7303 Prediabetes: Secondary | ICD-10-CM

## 2021-08-20 DIAGNOSIS — F419 Anxiety disorder, unspecified: Secondary | ICD-10-CM | POA: Diagnosis present

## 2021-08-20 DIAGNOSIS — F319 Bipolar disorder, unspecified: Secondary | ICD-10-CM | POA: Diagnosis present

## 2021-08-20 DIAGNOSIS — J45909 Unspecified asthma, uncomplicated: Secondary | ICD-10-CM

## 2021-08-20 DIAGNOSIS — Z833 Family history of diabetes mellitus: Secondary | ICD-10-CM | POA: Diagnosis not present

## 2021-08-20 DIAGNOSIS — I110 Hypertensive heart disease with heart failure: Secondary | ICD-10-CM | POA: Diagnosis present

## 2021-08-20 DIAGNOSIS — I5189 Other ill-defined heart diseases: Secondary | ICD-10-CM

## 2021-08-20 DIAGNOSIS — J9601 Acute respiratory failure with hypoxia: Secondary | ICD-10-CM | POA: Diagnosis present

## 2021-08-20 DIAGNOSIS — I5030 Unspecified diastolic (congestive) heart failure: Secondary | ICD-10-CM

## 2021-08-20 DIAGNOSIS — Z6841 Body Mass Index (BMI) 40.0 and over, adult: Secondary | ICD-10-CM | POA: Diagnosis not present

## 2021-08-20 DIAGNOSIS — I700 Atherosclerosis of aorta: Secondary | ICD-10-CM

## 2021-08-20 DIAGNOSIS — I509 Heart failure, unspecified: Secondary | ICD-10-CM | POA: Diagnosis present

## 2021-08-20 DIAGNOSIS — Z79899 Other long term (current) drug therapy: Secondary | ICD-10-CM | POA: Diagnosis not present

## 2021-08-20 DIAGNOSIS — Z8249 Family history of ischemic heart disease and other diseases of the circulatory system: Secondary | ICD-10-CM | POA: Diagnosis not present

## 2021-08-20 DIAGNOSIS — Z20822 Contact with and (suspected) exposure to covid-19: Secondary | ICD-10-CM | POA: Diagnosis present

## 2021-08-20 DIAGNOSIS — I5033 Acute on chronic diastolic (congestive) heart failure: Secondary | ICD-10-CM | POA: Diagnosis present

## 2021-08-20 DIAGNOSIS — I10 Essential (primary) hypertension: Secondary | ICD-10-CM

## 2021-08-20 DIAGNOSIS — Z886 Allergy status to analgesic agent status: Secondary | ICD-10-CM | POA: Diagnosis not present

## 2021-08-20 DIAGNOSIS — Z91012 Allergy to eggs: Secondary | ICD-10-CM | POA: Diagnosis not present

## 2021-08-20 LAB — HIV ANTIBODY (ROUTINE TESTING W REFLEX): HIV Screen 4th Generation wRfx: NONREACTIVE

## 2021-08-20 LAB — CBC
HCT: 37 % (ref 36.0–46.0)
Hemoglobin: 11.6 g/dL — ABNORMAL LOW (ref 12.0–15.0)
MCH: 29.4 pg (ref 26.0–34.0)
MCHC: 31.4 g/dL (ref 30.0–36.0)
MCV: 93.7 fL (ref 80.0–100.0)
Platelets: 311 10*3/uL (ref 150–400)
RBC: 3.95 MIL/uL (ref 3.87–5.11)
RDW: 14 % (ref 11.5–15.5)
WBC: 7.5 10*3/uL (ref 4.0–10.5)
nRBC: 0 % (ref 0.0–0.2)

## 2021-08-20 LAB — ECHOCARDIOGRAM COMPLETE
AR max vel: 2.29 cm2
AV Peak grad: 8.6 mmHg
Ao pk vel: 1.47 m/s
Area-P 1/2: 3.85 cm2
Calc EF: 58.5 %
Height: 61 in
S' Lateral: 3.2 cm
Single Plane A2C EF: 57.5 %
Single Plane A4C EF: 58.1 %
Weight: 6204.63 oz

## 2021-08-20 LAB — BASIC METABOLIC PANEL
Anion gap: 7 (ref 5–15)
BUN: 20 mg/dL (ref 6–20)
CO2: 30 mmol/L (ref 22–32)
Calcium: 8.5 mg/dL — ABNORMAL LOW (ref 8.9–10.3)
Chloride: 99 mmol/L (ref 98–111)
Creatinine, Ser: 0.73 mg/dL (ref 0.44–1.00)
GFR, Estimated: >60 mL/min (ref >60)
Glucose, Bld: 100 mg/dL — ABNORMAL HIGH (ref 70–99)
Potassium: 3.4 mmol/L — ABNORMAL LOW (ref 3.5–5.1)
Sodium: 136 mmol/L (ref 135–145)

## 2021-08-20 LAB — GLUCOSE, CAPILLARY
Glucose-Capillary: 90 mg/dL (ref 70–99)
Glucose-Capillary: 66 mg/dL — ABNORMAL LOW (ref 70–99)
Glucose-Capillary: 79 mg/dL (ref 70–99)
Glucose-Capillary: 120 mg/dL — ABNORMAL HIGH (ref 70–99)

## 2021-08-20 LAB — HEMOGLOBIN A1C
Hgb A1c MFr Bld: 5.9 % — ABNORMAL HIGH (ref 4.8–5.6)
Mean Plasma Glucose: 122.63 mg/dL

## 2021-08-20 MED ORDER — ATORVASTATIN CALCIUM 40 MG PO TABS
40.0000 mg | ORAL_TABLET | Freq: Every day | ORAL | Status: DC
  Filled 2021-08-20 (×2): qty 1

## 2021-08-20 MED ORDER — MELATONIN 5 MG PO TABS
5.0000 mg | ORAL_TABLET | Freq: Once | ORAL | Status: AC
Start: 2021-08-20 — End: 2021-08-20
  Administered 2021-08-20: 5 mg via ORAL
  Filled 2021-08-20: qty 1

## 2021-08-20 NOTE — TOC CM/SW Note (Signed)
°  Transition of Care [facility]) Screening Note   Patient Details  Name: Christina Dominguez Date of Birth: 1966-02-09   Transition of Care [facility]) CM/SW Contact:    Rally Ouch, Marta Lamas, LCSW Phone Number: 08/20/2021, 1:51 PM    Transition of Care Department [facility]) has reviewed patient and no TOC needs have been identified at this time. We will continue to monitor patient advancement through interdisciplinary progression rounds. If new patient transition needs arise, please place a TOC consult.

## 2021-08-20 NOTE — Progress Notes (Signed)
PROGRESS NOTE    Christina Dominguez  DXA:[id number] DOB: January 19, 1966 DOA: 08/18/2021 PCP: Pcp, No   Brief Narrative:  Christina Dominguez is a 56 y.o. female with medical history significant of anxiety, asthma, bipolar 1 disorder, heart failure with diastolic dysfunction, depression, chronic lower extremity edema, hypertension, joint pain, class III obesity with a BMI of 72.73 kg/m who is coming to the emergency department due to progressively worse dyspnea for the past 2 weeks associated with lower extremity edema for the past week or so consistent with heart failure exacerbation. New to the area and records are limited.  Assessment & Plan:   Acute diastolic congestive heart failure exacerbation(HCC) Acute hypoxic respiratory failure, POA Echo pending Continue fluid/salt restriction  Continue diuresis with furosemide 40 IV twice daily Creatinine stable  Essential hypertension Continue ARB. Follow-up BP, renal function electrolytes.   Class 3 severe obesity; BMI >70 Lifestyle modifications discussed at length She is planning on bariatric surgery. Follow-up with PCP.   Prediabetes CBG monitoring before meals and bedtime. Diet controlled.   Hepatic steatosis with hepatomegaly Check RUQ [place]. Weight loss.   Asthma Hypoxia secondary to heart failure - asthma appears to be well controlled   Aortic atherosclerosis Start atorvastatin 40 daily given risk factors as above  DVT prophylaxis: Lovenox Code Status: Full Family Communication: None present  Status is: Inpatient  Dispo: The patient is from: Home              Anticipated d/c is to: Home              Anticipated d/c date is: 24 to 48 hours              Patient currently not medically stable for discharge  Consultants:  None  Procedures:  Echocardiogram pending  Antimicrobials:  None  Subjective: No acute issues or events overnight respiratory status improving but not yet back to baseline, remains markedly  dyspneic with even minimal exertion.  Tolerating IV diuretics quite well with improving urine output.  Otherwise denies chest pain nausea vomiting diarrhea constipation headache fevers or chills  Objective: Vitals:   08/19/21 2116 08/20/21 0103 08/20/21 0500 08/20/21 0503  BP: 117/69 121/64  134/75  Pulse: 80 79  79  Resp: 19 19  19   Temp: 98.2 F (36.8 C) 98.1 F (36.7 C)  97.8 F (36.6 C)  TempSrc: Oral Oral  Oral  SpO2: 94% 94%  90%  Weight:   (!) 175.9 kg   Height:        Intake/Output Summary (Last 24 hours) at 08/20/2021 0745 Last data filed at 08/19/2021 1841 Gross per 24 hour  Intake --  Output 1800 ml  Net -1800 ml   Filed Weights   08/18/21 1958 08/19/21 1600 08/20/21 0500  Weight: (!) 174.6 kg (!) 173.4 kg (!) 175.9 kg    Examination:  General:  Pleasantly resting in bed, No acute distress. HEENT:  Normocephalic atraumatic.  Sclerae nonicteric, noninjected.  Extraocular movements intact bilaterally. Neck:  Without mass or deformity.  Trachea is midline. Lungs: Distant lung sounds, minimal bibasilar rales. Heart:  Regular rate and rhythm.  Without murmurs, rubs, or gallops. Abdomen:  Soft, nontender, nondistended.  Without guarding or rebound. Extremities: Without cyanosis, clubbing, edema, or obvious deformity. Vascular:  Dorsalis pedis and posterior tibial pulses palpable bilaterally. Skin:  Warm and dry, no erythema, no ulcerations.  Data Reviewed: I have personally reviewed following labs and imaging studies  CBC: Recent Labs  Lab 08/18/21 1253  WBC 9.4  NEUTROABS 7.0  HGB 11.7*  HCT 36.6  MCV 90.8  PLT 638   Basic Metabolic Panel: Recent Labs  Lab 08/18/21 1253  NA 136  K 3.7  CL 99  CO2 28  GLUCOSE 93  BUN 22*  CREATININE 0.84  CALCIUM 8.9   GFR: Estimated Creatinine Clearance: 116.9 mL/min (by C-G formula based on SCr of 0.84 mg/dL). Liver Function Tests: Recent Labs  Lab 08/18/21 1253  AST 20  ALT 17  ALKPHOS 97  BILITOT 0.6   PROT 8.2*  ALBUMIN 3.9   No results for input(s): LIPASE, AMYLASE in the last 168 hours. No results for input(s): AMMONIA in the last 168 hours. Coagulation Profile: No results for input(s): INR, PROTIME in the last 168 hours. Cardiac Enzymes: No results for input(s): CKTOTAL, CKMB, CKMBINDEX, TROPONINI in the last 168 hours. BNP (last 3 results) Recent Labs    08/09/21 1651  PROBNP 106   HbA1C: No results for input(s): HGBA1C in the last 72 hours. CBG: Recent Labs  Lab 08/19/21 1649 08/19/21 2113 08/20/21 0737  GLUCAP 131* 129* 90   Lipid Profile: No results for input(s): CHOL, HDL, LDLCALC, TRIG, CHOLHDL, LDLDIRECT in the last 72 hours. Thyroid Function Tests: No results for input(s): TSH, T4TOTAL, FREET4, T3FREE, THYROIDAB in the last 72 hours. Anemia Panel: No results for input(s): VITAMINB12, FOLATE, FERRITIN, TIBC, IRON, RETICCTPCT in the last 72 hours. Sepsis Labs: No results for input(s): PROCALCITON, LATICACIDVEN in the last 168 hours.  Recent Results (from the past 240 hour(s))  Resp Panel by RT-PCR (Flu A&B, Covid) Nasopharyngeal Swab     Status: None   Collection Time: 08/18/21 10:02 PM   Specimen: Nasopharyngeal Swab; Nasopharyngeal(NP) swabs in vial transport medium  Result Value Ref Range Status   SARS Coronavirus 2 by RT PCR NEGATIVE NEGATIVE Final    Comment: (NOTE) SARS-CoV-2 target nucleic acids are NOT DETECTED.  The SARS-CoV-2 RNA is generally detectable in upper respiratory specimens during the acute phase of infection. The lowest concentration of SARS-CoV-2 viral copies this assay can detect is 138 copies/mL. A negative result does not preclude SARS-Cov-2 infection and should not be used as the sole basis for treatment or other patient management decisions. A negative result may occur with  improper specimen collection/handling, submission of specimen other than nasopharyngeal swab, presence of viral mutation(s) within the areas targeted by  this assay, and inadequate number of viral copies(<138 copies/mL). A negative result must be combined with clinical observations, patient history, and epidemiological information. The expected result is Negative.  Fact Sheet for Patients:  EntrepreneurPulse.com.au  Fact Sheet for Healthcare Providers:  IncredibleEmployment.be  This test is no t yet approved or cleared by the [place] FDA and  has been authorized for detection and/or diagnosis of SARS-CoV-2 by FDA under an Emergency Use Authorization (EUA). This EUA will remain  in effect (meaning this test can be used) for the duration of the COVID-19 declaration under Section 564(b)(1) of the Act, 21 U.S.C.section 360bbb-3(b)(1), unless the authorization is terminated  or revoked sooner.       Influenza A by PCR NEGATIVE NEGATIVE Final   Influenza B by PCR NEGATIVE NEGATIVE Final    Comment: (NOTE) The Xpert Xpress SARS-CoV-2/FLU/RSV plus assay is intended as an aid in the diagnosis of influenza from Nasopharyngeal swab specimens and should not be used as a sole basis for treatment. Nasal washings and aspirates are unacceptable for Xpert Xpress SARS-CoV-2/FLU/RSV testing.  Fact Sheet for Patients: EntrepreneurPulse.com.au  Fact Sheet for Healthcare Providers: IncredibleEmployment.be  This test is not yet approved or cleared by the [place] FDA and has been authorized for detection and/or diagnosis of SARS-CoV-2 by FDA under an Emergency Use Authorization (EUA). This EUA will remain in effect (meaning this test can be used) for the duration of the COVID-19 declaration under Section 564(b)(1) of the Act, 21 U.S.C. section 360bbb-3(b)(1), unless the authorization is terminated or revoked.  Performed at [facility], [address], [place], [place] [address]          Radiology Studies: CT ABDOMEN PELVIS WO CONTRAST  Result  Date: 08/18/2021 CLINICAL DATA:  Right lower quadrant abdominal pain. So the real indication EXAM: CT ABDOMEN AND PELVIS WITHOUT CONTRAST TECHNIQUE: Multidetector CT imaging of the abdomen and pelvis was performed following the standard protocol without IV contrast. RADIATION DOSE REDUCTION: This exam was performed according to the departmental dose-optimization program which includes automated exposure control, adjustment of the mA and/or kV according to patient size and/or use of iterative reconstruction technique. COMPARISON:  None. FINDINGS: Study is degraded by photon attenuation related to patient habitus. Lower chest: No acute abnormality. Hepatobiliary: Hepatomegaly measuring 19.7 cm in maximum craniocaudal dimension at the midclavicular line. Probable hepatic steatosis. Gallbladder is unremarkable. No biliary ductal dilation. Pancreas: No pancreatic ductal dilation. Spleen: Normal in size without focal abnormality. Adrenals/Urinary Tract: Bilateral adrenal glands appear normal. No hydronephrosis. No renal, ureteral or bladder calculi identified. Stomach/Bowel: No enteric contrast was administered. Stomach is decompressed limiting evaluation. Mild wall thickening of the proximal duodenum. No pathologic dilation of large or small bowel. Colonic diverticulosis without findings of acute diverticulitis. Vascular/Lymphatic: Scattered aortic atherosclerosis without aneurysmal dilation. No pathologically enlarged abdominal or pelvic lymph nodes. Reproductive: Uterus and bilateral adnexa are unremarkable. Other: No abdominopelvic free fluid. Musculoskeletal: Multilevel degenerative changes spine. No acute osseous abnormality. IMPRESSION: Study is degraded by photon attenuation related to patient habitus. Within this context: 1. Mild wall thickening of the proximal duodenum, which may represent artifact versus nonspecific duodenitis. 2. Colonic diverticulosis without findings of acute diverticulitis. 3. Hepatomegaly  with probable diffuse hepatic steatosis. 4.  Aortic Atherosclerosis (ICD10-I70.0). Electronically Signed   By: Dahlia Bailiff M.D.   On: 08/18/2021 14:13   DG Chest 2 View  Result Date: 08/18/2021 CLINICAL DATA:  Shortness of breath. EXAM: CHEST - 2 VIEW COMPARISON:  June 28, 2021. FINDINGS: Stable cardiomegaly with mild central pulmonary vascular congestion. No consolidative process is noted. The visualized skeletal structures are unremarkable. IMPRESSION: Stable cardiomegaly with mild central pulmonary vascular congestion. Electronically Signed   By: Marijo Conception M.D.   On: 08/18/2021 13:23   CT HEAD WO CONTRAST (5MM)  Result Date: 08/19/2021 CLINICAL DATA:  Memory loss EXAM: CT HEAD WITHOUT CONTRAST TECHNIQUE: Contiguous axial images were obtained from the base of the skull through the vertex without intravenous contrast. RADIATION DOSE REDUCTION: This exam was performed according to the departmental dose-optimization program which includes automated exposure control, adjustment of the mA and/or kV according to patient size and/or use of iterative reconstruction technique. COMPARISON:  None. FINDINGS: Brain: There is no mass, hemorrhage or extra-axial collection. The size and configuration of the ventricles and extra-axial CSF spaces are normal. The brain parenchyma is normal, without acute or chronic infarction. Vascular: No abnormal hyperdensity of the major intracranial arteries or dural venous sinuses. No intracranial atherosclerosis. Skull: The visualized skull base, calvarium and extracranial soft tissues are normal. Sinuses/Orbits: No fluid levels or advanced mucosal thickening of  the visualized paranasal sinuses. No mastoid or middle ear effusion. The orbits are normal. IMPRESSION: Normal head CT. Electronically Signed   By: Ulyses Jarred M.D.   On: 08/19/2021 21:49   US Abdomen Limited RUQ (LIVER/GB)  Result Date: 08/20/2021 CLINICAL DATA:  Right upper quadrant pain. EXAM: ULTRASOUND  ABDOMEN LIMITED RIGHT UPPER QUADRANT COMPARISON:  CT without contrast 08/18/2021. FINDINGS: Factors affecting image quality: Large size of the patient attenuates the beam and causes limited image resolution. Gallbladder: No gallstones or wall thickening visualized. No sonographic Murphy sign noted by sonographer. Common bile duct: This was unable to be visualized, but on CT it did not appear dilated, measuring 5.5 mm. Liver: No focal lesion identified. On CT the liver measuring 19.7 cm length. On ultrasound there is increased echogenicity of steatosis with no focal lesion being seen through the steatosis. Portal vein is patent on color Doppler imaging with normal direction of blood flow towards the liver. Other: No focal abnormality of the right kidney, which is only partially visible in the upper pole. There is no free fluid. IMPRESSION: 1. Unremarkable gallbladder wall and lumen. 2. Fatty liver, on CT measured mildly enlarged. 3. No portal vein dilatation or flow reversal. 4. Nonvisualization of the common bile duct but on CT was not dilated. Electronically Signed   By: Telford Nab M.D.   On: 08/20/2021 06:37    Scheduled Meds:  enoxaparin (LOVENOX) injection  80 mg Subcutaneous Q24H   furosemide  40 mg Intravenous BID   irbesartan  37.5 mg Oral Daily   pantoprazole  40 mg Oral Daily   potassium chloride  10 mEq Oral Daily    LOS: 0 days   Time spent: 17min  Jazzie Trampe C Cottrell Gentles, DO [facility]  If 7PM-7AM, please contact night-coverage [email or web address]  08/20/2021, 7:45 AM

## 2021-08-21 DIAGNOSIS — I5033 Acute on chronic diastolic (congestive) heart failure: Secondary | ICD-10-CM

## 2021-08-21 LAB — GLUCOSE, CAPILLARY
Glucose-Capillary: 93 mg/dL (ref 70–99)
Glucose-Capillary: 146 mg/dL — ABNORMAL HIGH (ref 70–99)
Glucose-Capillary: 142 mg/dL — ABNORMAL HIGH (ref 70–99)

## 2021-08-21 LAB — BASIC METABOLIC PANEL
Anion gap: 10 (ref 5–15)
BUN: 26 mg/dL — ABNORMAL HIGH (ref 6–20)
CO2: 31 mmol/L (ref 22–32)
Calcium: 8.9 mg/dL (ref 8.9–10.3)
Chloride: 96 mmol/L — ABNORMAL LOW (ref 98–111)
Creatinine, Ser: 1 mg/dL (ref 0.44–1.00)
GFR, Estimated: >60 mL/min (ref >60)
Glucose, Bld: 105 mg/dL — ABNORMAL HIGH (ref 70–99)
Potassium: 3.3 mmol/L — ABNORMAL LOW (ref 3.5–5.1)
Sodium: 137 mmol/L (ref 135–145)

## 2021-08-21 LAB — CBC
HCT: 39.9 % (ref 36.0–46.0)
Hemoglobin: 12.7 g/dL (ref 12.0–15.0)
MCH: 29.5 pg (ref 26.0–34.0)
MCHC: 31.8 g/dL (ref 30.0–36.0)
MCV: 92.6 fL (ref 80.0–100.0)
Platelets: 371 10*3/uL (ref 150–400)
RBC: 4.31 MIL/uL (ref 3.87–5.11)
RDW: 14.1 % (ref 11.5–15.5)
WBC: 9.4 10*3/uL (ref 4.0–10.5)
nRBC: 0 % (ref 0.0–0.2)

## 2021-08-21 MED ORDER — ATORVASTATIN CALCIUM 40 MG PO TABS: 40.0000 mg | ORAL_TABLET | Freq: Every day | ORAL | 0 refills | Status: DC

## 2021-08-21 NOTE — Discharge Summary (Signed)
Physician Discharge Summary  Christina Dominguez GYI:[id number] DOB: [birth year] DOA: 08/18/2021  PCP: Merryl Hacker, No  Admit date: 08/18/2021 Discharge date: 08/21/2021  Admitted From: Home Disposition: Home  Recommendations for Outpatient Follow-up:  Follow up with PCP in 1-2 weeks Please obtain BMP/CBC in one week Please follow up with Dr. Bettina Gavia cardiology as scheduled later this week  Home Health: None Equipment/Devices: No new equipment indicated  Discharge Condition: Stable CODE STATUS: Full Diet recommendation: Low-salt low-fat diet, fluid restricted to 1500 cc(1.5 L)  Brief/Interim Summary: Christina Dominguez is a 56 y.o. female with medical history significant of anxiety, asthma, bipolar 1 disorder, heart failure with diastolic dysfunction, depression, chronic lower extremity edema, hypertension, joint pain, class III obesity with a BMI of 72.73 kg/m who is coming to the emergency department due to progressively worse dyspnea for the past 2 weeks associated with lower extremity edema for the past week or so consistent with heart failure exacerbation. New to the area and records are limited.   Assessment & Plan:   Acute diastolic congestive heart failure exacerbation(HCC) Acute hypoxic respiratory failure, POA Echo 55-60% with grade 1 diastolic dysfunction Continue fluid/salt restriction at discharge as discussed Patient diuresed quite drastically over the past 24 hours, nearly 4 L down, able to ambulate without any further hypoxia or symptoms and otherwise stable for discharge -Outpatient follow-up with Dr. Bettina Gavia her cardiologist as scheduled  Essential hypertension Continue ARB.   Class 3 severe obesity; BMI >70 Lifestyle modifications discussed at length She is planning on bariatric surgery once medically cleared Follow-up with PCP.   Prediabetes CBG monitoring before meals and bedtime. Diet controlled.   Hepatic steatosis with hepatomegaly Check RUQ [place]. Weight loss.    Asthma Hypoxia secondary to heart failure - asthma appears to be well controlled   Aortic atherosclerosis Continue new medication atorvastatin 40 daily given risk factors as above  Discharge Instructions   Allergies as of 08/21/2021       Reactions   Aspirin    Eggs Or Egg-derived Products Swelling   Other Rash   Pt states she is allergic to eggs and flu vaccine - swelling        Medication List     TAKE these medications    acetaminophen 650 MG CR tablet Commonly known as: TYLENOL Take 1,300 mg by mouth daily.   albuterol 108 (90 Base) MCG/ACT inhaler Commonly known as: VENTOLIN HFA Inhale 1 puff into the lungs every 4 (four) hours as needed for wheezing or shortness of breath.   atorvastatin 40 MG tablet Commonly known as: LIPITOR Take 1 tablet (40 mg total) by mouth daily. Start taking on: August 22, 2021   CATS CLAW [facility] TOMENTOSA) PO Take 2 capsules by mouth daily.   diclofenac Sodium 1 % Gel Commonly known as: VOLTAREN Apply 2 g topically daily as needed (pain).   furosemide 40 MG tablet Commonly known as: LASIX Take 1 tablet (40 mg total) by mouth daily.   potassium chloride 10 MEQ tablet Commonly known as: KLOR-CON Take 1 tablet (10 mEq total) by mouth daily.   valsartan 40 MG tablet Commonly known as: Diovan Take 1 tablet (40 mg total) by mouth daily.        Allergies  Allergen Reactions   Aspirin    Eggs Or Egg-Derived Products Swelling   Other Rash    Pt states she is allergic to eggs and flu vaccine - swelling    Consultations: None  Procedures/Studies: CT ABDOMEN PELVIS WO CONTRAST  Result Date: 08/18/2021 CLINICAL DATA:  Right lower quadrant abdominal pain. So the real indication EXAM: CT ABDOMEN AND PELVIS WITHOUT CONTRAST TECHNIQUE: Multidetector CT imaging of the abdomen and pelvis was performed following the standard protocol without IV contrast. RADIATION DOSE REDUCTION: This exam was performed according to the  departmental dose-optimization program which includes automated exposure control, adjustment of the mA and/or kV according to patient size and/or use of iterative reconstruction technique. COMPARISON:  None. FINDINGS: Study is degraded by photon attenuation related to patient habitus. Lower chest: No acute abnormality. Hepatobiliary: Hepatomegaly measuring 19.7 cm in maximum craniocaudal dimension at the midclavicular line. Probable hepatic steatosis. Gallbladder is unremarkable. No biliary ductal dilation. Pancreas: No pancreatic ductal dilation. Spleen: Normal in size without focal abnormality. Adrenals/Urinary Tract: Bilateral adrenal glands appear normal. No hydronephrosis. No renal, ureteral or bladder calculi identified. Stomach/Bowel: No enteric contrast was administered. Stomach is decompressed limiting evaluation. Mild wall thickening of the proximal duodenum. No pathologic dilation of large or small bowel. Colonic diverticulosis without findings of acute diverticulitis. Vascular/Lymphatic: Scattered aortic atherosclerosis without aneurysmal dilation. No pathologically enlarged abdominal or pelvic lymph nodes. Reproductive: Uterus and bilateral adnexa are unremarkable. Other: No abdominopelvic free fluid. Musculoskeletal: Multilevel degenerative changes spine. No acute osseous abnormality. IMPRESSION: Study is degraded by photon attenuation related to patient habitus. Within this context: 1. Mild wall thickening of the proximal duodenum, which may represent artifact versus nonspecific duodenitis. 2. Colonic diverticulosis without findings of acute diverticulitis. 3. Hepatomegaly with probable diffuse hepatic steatosis. 4.  Aortic Atherosclerosis (ICD10-I70.0). Electronically Signed   By: Dahlia Bailiff M.D.   On: 08/18/2021 14:13   DG Chest 2 View  Result Date: 08/18/2021 CLINICAL DATA:  Shortness of breath. EXAM: CHEST - 2 VIEW COMPARISON:  June 28, 2021. FINDINGS: Stable cardiomegaly with mild  central pulmonary vascular congestion. No consolidative process is noted. The visualized skeletal structures are unremarkable. IMPRESSION: Stable cardiomegaly with mild central pulmonary vascular congestion. Electronically Signed   By: Marijo Conception M.D.   On: 08/18/2021 13:23   CT HEAD WO CONTRAST (5MM)  Result Date: 08/19/2021 CLINICAL DATA:  Memory loss EXAM: CT HEAD WITHOUT CONTRAST TECHNIQUE: Contiguous axial images were obtained from the base of the skull through the vertex without intravenous contrast. RADIATION DOSE REDUCTION: This exam was performed according to the departmental dose-optimization program which includes automated exposure control, adjustment of the mA and/or kV according to patient size and/or use of iterative reconstruction technique. COMPARISON:  None. FINDINGS: Brain: There is no mass, hemorrhage or extra-axial collection. The size and configuration of the ventricles and extra-axial CSF spaces are normal. The brain parenchyma is normal, without acute or chronic infarction. Vascular: No abnormal hyperdensity of the major intracranial arteries or dural venous sinuses. No intracranial atherosclerosis. Skull: The visualized skull base, calvarium and extracranial soft tissues are normal. Sinuses/Orbits: No fluid levels or advanced mucosal thickening of the visualized paranasal sinuses. No mastoid or middle ear effusion. The orbits are normal. IMPRESSION: Normal head CT. Electronically Signed   By: Ulyses Jarred M.D.   On: 08/19/2021 21:49   ECHOCARDIOGRAM COMPLETE  Result Date: 08/20/2021    ECHOCARDIOGRAM REPORT   Patient Name:   Christina Dominguez Date of Exam: 08/20/2021 Medical Rec #:  [id number]          Height:       61.0 in Accession #:    [id number]         Weight:       387.8 lb Date of  Birth:  [birth year]           BSA:          2.503 m Patient Age:    4 years           BP:           134/75 mmHg Patient Gender: F                  HR:           68 bpm. Exam Location:  Inpatient  Procedure: 2D Echo, Cardiac Doppler and Color Doppler Indications:    CHF  History:        Patient has no prior history of Echocardiogram examinations.                 Risk Factors:Hypertension.  Sonographer:    Jyl Heinz Referring Phys: [id number] DAVID MANUEL [place]  1. Left ventricular ejection fraction, by estimation, is 55 to 60%. The left ventricle has normal function. The left ventricle has no regional wall motion abnormalities. There is mild concentric left ventricular hypertrophy. Left ventricular diastolic parameters are consistent with Grade I diastolic dysfunction (impaired relaxation).  2. Right ventricular systolic function is normal. The right ventricular size is normal. Tricuspid regurgitation signal is inadequate for assessing PA pressure.  3. The mitral valve is normal in structure. No evidence of mitral valve regurgitation. No evidence of mitral stenosis.  4. The aortic valve was not well visualized. Aortic valve regurgitation is not visualized. No aortic stenosis is present.  5. The inferior vena cava is normal in size with <50% respiratory variability, suggesting right atrial pressure of 8 mmHg. Comparison(s): No prior Echocardiogram. FINDINGS  Left Ventricle: Left ventricular ejection fraction, by estimation, is 55 to 60%. The left ventricle has normal function. The left ventricle has no regional wall motion abnormalities. The left ventricular internal cavity size was normal in size. There is  mild concentric left ventricular hypertrophy. Left ventricular diastolic parameters are consistent with Grade I diastolic dysfunction (impaired relaxation). Right Ventricle: The right ventricular size is normal. No increase in right ventricular wall thickness. Right ventricular systolic function is normal. Tricuspid regurgitation signal is inadequate for assessing PA pressure. Left Atrium: Left atrial size was normal in size. Right Atrium: Right atrial size was normal in size. Pericardium:  Trivial pericardial effusion is present. Presence of epicardial fat layer. Mitral Valve: The mitral valve is normal in structure. No evidence of mitral valve regurgitation. No evidence of mitral valve stenosis. Tricuspid Valve: The tricuspid valve is normal in structure. Tricuspid valve regurgitation is not demonstrated. No evidence of tricuspid stenosis. Aortic Valve: The aortic valve was not well visualized. Aortic valve regurgitation is not visualized. No aortic stenosis is present. Aortic valve peak gradient measures 8.6 mmHg. Pulmonic Valve: The pulmonic valve was not well visualized. Pulmonic valve regurgitation is not visualized. No evidence of pulmonic stenosis. Aorta: The aortic root and ascending aorta are structurally normal, with no evidence of dilitation. Venous: The inferior vena cava is normal in size with less than 50% respiratory variability, suggesting right atrial pressure of 8 mmHg. IAS/Shunts: No atrial level shunt detected by color flow Doppler.  LEFT VENTRICLE PLAX 2D LVIDd:         4.60 cm      Diastology LVIDs:         3.20 cm      LV e' medial:    7.07 cm/s LV PW:  1.10 cm      LV E/e' medial:  12.7 LV IVS:        1.30 cm      LV e' lateral:   9.68 cm/s LVOT diam:     2.00 cm      LV E/e' lateral: 9.3 LV SV:         77 LV SV Index:   31 LVOT Area:     3.14 cm  LV Volumes (MOD) LV vol d, MOD A2C: 115.0 ml LV vol d, MOD A4C: 126.0 ml LV vol s, MOD A2C: 48.9 ml LV vol s, MOD A4C: 52.8 ml LV SV MOD A2C:     66.1 ml LV SV MOD A4C:     126.0 ml LV SV MOD BP:      71.9 ml RIGHT VENTRICLE             IVC RV Basal diam:  4.00 cm     IVC diam: 2.10 cm RV S prime:     11.10 cm/s LEFT ATRIUM             Index        RIGHT ATRIUM           Index LA diam:        3.00 cm 1.20 cm/m   RA Area:     19.40 cm LA Vol (A2C):   47.4 ml 18.94 ml/m  RA Volume:   55.20 ml  22.06 ml/m LA Vol (A4C):   47.8 ml 19.10 ml/m LA Biplane Vol: 48.0 ml 19.18 ml/m  AORTIC VALVE AV Area (Vmax): 2.29 cm AV Vmax:         147.00 cm/s AV Peak Grad:   8.6 mmHg LVOT Vmax:      107.00 cm/s LVOT Vmean:     74.900 cm/s LVOT VTI:       0.246 m  AORTA Ao Root diam: 2.90 cm Ao Asc diam:  3.20 cm MITRAL VALVE MV Area (PHT): 3.85 cm    SHUNTS MV Decel Time: 197 msec    Systemic VTI:  0.25 m MV E velocity: 90.00 cm/s  Systemic Diam: 2.00 cm MV A velocity: 91.20 cm/s MV E/A ratio:  0.99 Christina Haskell MD Electronically signed by Christina Haskell MD Signature Date/Time: 08/20/2021/12:40:53 PM    Final    US Abdomen Limited RUQ (LIVER/GB)  Result Date: 08/20/2021 CLINICAL DATA:  Right upper quadrant pain. EXAM: ULTRASOUND ABDOMEN LIMITED RIGHT UPPER QUADRANT COMPARISON:  CT without contrast 08/18/2021. FINDINGS: Factors affecting image quality: Large size of the patient attenuates the beam and causes limited image resolution. Gallbladder: No gallstones or wall thickening visualized. No sonographic Murphy sign noted by sonographer. Common bile duct: This was unable to be visualized, but on CT it did not appear dilated, measuring 5.5 mm. Liver: No focal lesion identified. On CT the liver measuring 19.7 cm length. On ultrasound there is increased echogenicity of steatosis with no focal lesion being seen through the steatosis. Portal vein is patent on color Doppler imaging with normal direction of blood flow towards the liver. Other: No focal abnormality of the right kidney, which is only partially visible in the upper pole. There is no free fluid. IMPRESSION: 1. Unremarkable gallbladder wall and lumen. 2. Fatty liver, on CT measured mildly enlarged. 3. No portal vein dilatation or flow reversal. 4. Nonvisualization of the common bile duct but on CT was not dilated. Electronically Signed   By: Telford Nab M.D.   On:  08/20/2021 06:37     Subjective: No acute issues or events overnight denies nausea vomiting diarrhea constipation headache fevers chills or chest pain.  Able to ambulate today without any dyspnea hypoxia or other  respiratory symptoms   Discharge Exam: Vitals:   08/20/21 2026 08/21/21 0501  BP: 111/67 137/81  Pulse: 79 81  Resp: 20 17  Temp: 97.6 F (36.4 C) 98.3 F (36.8 C)  SpO2: 95% 94%   Vitals:   08/20/21 1508 08/20/21 2026 08/21/21 0500 08/21/21 0501  BP: (!) 118/58 111/67  137/81  Pulse: 73 79  81  Resp: 16 20  17   Temp: 97.6 F (36.4 C) 97.6 F (36.4 C)  98.3 F (36.8 C)  TempSrc:  Oral  Oral  SpO2: 100% 95%  94%  Weight:   (!) 170.3 kg   Height:        General: Pt is alert, awake, not in acute distress Cardiovascular: RRR, S1/S2 +, no rubs, no gallops Respiratory: CTA bilaterally, no wheezing, no rhonchi Abdominal: Soft, NT, ND, bowel sounds + Extremities: no edema, no cyanosis    The results of significant diagnostics from this hospitalization (including imaging, microbiology, ancillary and laboratory) are listed below for reference.     Microbiology: Recent Results (from the past 240 hour(s))  Resp Panel by RT-PCR (Flu A&B, Covid) Nasopharyngeal Swab     Status: None   Collection Time: 08/18/21 10:02 PM   Specimen: Nasopharyngeal Swab; Nasopharyngeal(NP) swabs in vial transport medium  Result Value Ref Range Status   SARS Coronavirus 2 by RT PCR NEGATIVE NEGATIVE Final    Comment: (NOTE) SARS-CoV-2 target nucleic acids are NOT DETECTED.  The SARS-CoV-2 RNA is generally detectable in upper respiratory specimens during the acute phase of infection. The lowest concentration of SARS-CoV-2 viral copies this assay can detect is 138 copies/mL. A negative result does not preclude SARS-Cov-2 infection and should not be used as the sole basis for treatment or other patient management decisions. A negative result may occur with  improper specimen collection/handling, submission of specimen other than nasopharyngeal swab, presence of viral mutation(s) within the areas targeted by this assay, and inadequate number of viral copies(<138 copies/mL). A negative result must  be combined with clinical observations, patient history, and epidemiological information. The expected result is Negative.  Fact Sheet for Patients:  EntrepreneurPulse.com.au  Fact Sheet for Healthcare Providers:  IncredibleEmployment.be  This test is no t yet approved or cleared by the [place] FDA and  has been authorized for detection and/or diagnosis of SARS-CoV-2 by FDA under an Emergency Use Authorization (EUA). This EUA will remain  in effect (meaning this test can be used) for the duration of the COVID-19 declaration under Section 564(b)(1) of the Act, 21 U.S.C.section 360bbb-3(b)(1), unless the authorization is terminated  or revoked sooner.       Influenza A by PCR NEGATIVE NEGATIVE Final   Influenza B by PCR NEGATIVE NEGATIVE Final    Comment: (NOTE) The Xpert Xpress SARS-CoV-2/FLU/RSV plus assay is intended as an aid in the diagnosis of influenza from Nasopharyngeal swab specimens and should not be used as a sole basis for treatment. Nasal washings and aspirates are unacceptable for Xpert Xpress SARS-CoV-2/FLU/RSV testing.  Fact Sheet for Patients: EntrepreneurPulse.com.au  Fact Sheet for Healthcare Providers: IncredibleEmployment.be  This test is not yet approved or cleared by the [place] FDA and has been authorized for detection and/or diagnosis of SARS-CoV-2 by FDA under an Emergency Use Authorization (EUA). This EUA will remain in  effect (meaning this test can be used) for the duration of the COVID-19 declaration under Section 564(b)(1) of the Act, 21 U.S.C. section 360bbb-3(b)(1), unless the authorization is terminated or revoked.  Performed at [facility], [address] [place], [place] [address]      Labs: BNP (last 3 results) Recent Labs    06/28/21 2102 08/18/21 1253  BNP 19.6 29.7   Basic Metabolic Panel: Recent Labs  Lab 08/18/21 1253  08/20/21 0918 08/21/21 0322  NA 136 136 137  K 3.7 3.4* 3.3*  CL 99 99 96*  CO2 28 30 31   GLUCOSE 93 100* 105*  BUN 22* 20 26*  CREATININE 0.84 0.73 1.00  CALCIUM 8.9 8.5* 8.9   Liver Function Tests: Recent Labs  Lab 08/18/21 1253  AST 20  ALT 17  ALKPHOS 97  BILITOT 0.6  PROT 8.2*  ALBUMIN 3.9   No results for input(s): LIPASE, AMYLASE in the last 168 hours. No results for input(s): AMMONIA in the last 168 hours. CBC: Recent Labs  Lab 08/18/21 1253 08/20/21 0918 08/21/21 0322  WBC 9.4 7.5 9.4  NEUTROABS 7.0  --   --   HGB 11.7* 11.6* 12.7  HCT 36.6 37.0 39.9  MCV 90.8 93.7 92.6  PLT 348 311 371   Cardiac Enzymes: No results for input(s): CKTOTAL, CKMB, CKMBINDEX, TROPONINI in the last 168 hours. BNP: Invalid input(s): POCBNP CBG: Recent Labs  Lab 08/20/21 1127 08/20/21 1624 08/20/21 2023 08/21/21 0753 08/21/21 1205  GLUCAP 66* 79 120* 93 146*   D-Dimer No results for input(s): DDIMER in the last 72 hours. Hgb A1c Recent Labs    08/20/21 0312  HGBA1C 5.9*   Lipid Profile No results for input(s): CHOL, HDL, LDLCALC, TRIG, CHOLHDL, LDLDIRECT in the last 72 hours. Thyroid function studies No results for input(s): TSH, T4TOTAL, T3FREE, THYROIDAB in the last 72 hours.  Invalid input(s): FREET3 Anemia work up No results for input(s): VITAMINB12, FOLATE, FERRITIN, TIBC, IRON, RETICCTPCT in the last 72 hours. Urinalysis    Component Value Date/Time   COLORURINE YELLOW 08/18/2021 [place] 08/18/2021 1253   LABSPEC 1.010 08/18/2021 1253   PHURINE 5.5 08/18/2021 1253   GLUCOSEU NEGATIVE 08/18/2021 1253   HGBUR TRACE (A) 08/18/2021 1253   BILIRUBINUR NEGATIVE 08/18/2021 [place] 08/18/2021 1253   PROTEINUR NEGATIVE 08/18/2021 1253   NITRITE NEGATIVE 08/18/2021 1253   LEUKOCYTESUR NEGATIVE 08/18/2021 1253   Sepsis Labs Invalid input(s): PROCALCITONIN,  WBC,  LACTICIDVEN Microbiology Recent Results (from the  past 240 hour(s))  Resp Panel by RT-PCR (Flu A&B, Covid) Nasopharyngeal Swab     Status: None   Collection Time: 08/18/21 10:02 PM   Specimen: Nasopharyngeal Swab; Nasopharyngeal(NP) swabs in vial transport medium  Result Value Ref Range Status   SARS Coronavirus 2 by RT PCR NEGATIVE NEGATIVE Final    Comment: (NOTE) SARS-CoV-2 target nucleic acids are NOT DETECTED.  The SARS-CoV-2 RNA is generally detectable in upper respiratory specimens during the acute phase of infection. The lowest concentration of SARS-CoV-2 viral copies this assay can detect is 138 copies/mL. A negative result does not preclude SARS-Cov-2 infection and should not be used as the sole basis for treatment or other patient management decisions. A negative result may occur with  improper specimen collection/handling, submission of specimen other than nasopharyngeal swab, presence of viral mutation(s) within the areas targeted by this assay, and inadequate number of viral copies(<138 copies/mL). A negative result must be combined  with clinical observations, patient history, and epidemiological information. The expected result is Negative.  Fact Sheet for Patients:  EntrepreneurPulse.com.au  Fact Sheet for Healthcare Providers:  IncredibleEmployment.be  This test is no t yet approved or cleared by the [place] FDA and  has been authorized for detection and/or diagnosis of SARS-CoV-2 by FDA under an Emergency Use Authorization (EUA). This EUA will remain  in effect (meaning this test can be used) for the duration of the COVID-19 declaration under Section 564(b)(1) of the Act, 21 U.S.C.section 360bbb-3(b)(1), unless the authorization is terminated  or revoked sooner.       Influenza A by PCR NEGATIVE NEGATIVE Final   Influenza B by PCR NEGATIVE NEGATIVE Final    Comment: (NOTE) The Xpert Xpress SARS-CoV-2/FLU/RSV plus assay is intended as an aid in the diagnosis of  influenza from Nasopharyngeal swab specimens and should not be used as a sole basis for treatment. Nasal washings and aspirates are unacceptable for Xpert Xpress SARS-CoV-2/FLU/RSV testing.  Fact Sheet for Patients: EntrepreneurPulse.com.au  Fact Sheet for Healthcare Providers: IncredibleEmployment.be  This test is not yet approved or cleared by the [place] FDA and has been authorized for detection and/or diagnosis of SARS-CoV-2 by FDA under an Emergency Use Authorization (EUA). This EUA will remain in effect (meaning this test can be used) for the duration of the COVID-19 declaration under Section 564(b)(1) of the Act, 21 U.S.C. section 360bbb-3(b)(1), unless the authorization is terminated or revoked.  Performed at [facility], [address] [place], [place] [address]      Time coordinating discharge: Over 30 minutes  SIGNED:   Little Ishikawa, DO [facility] 08/21/2021, 4:50 PM Pager   If 7PM-7AM, please contact night-coverage [email or web address]

## 2021-08-21 NOTE — Evaluation (Signed)
Physical Therapy One Time Evaluation Patient Details Name: Christina Dominguez MRN: [id number] DOB: [birth year] Today's Date: 08/21/2021  History of Present Illness  56 y.o. female with medical history significant of anxiety, asthma, bipolar 1 disorder, heart failure with diastolic dysfunction, depression, chronic lower extremity edema, hypertension, joint pain, class III obesity and admitted for Acute diastolic congestive heart failure exacerbation  Clinical Impression  Patient evaluated by Physical Therapy with no further acute PT needs identified. All education has been completed and the patient has no further questions.  Pt up in bathroom on arrival.  Pt ambulated in hallway and required some standing rest breaks however appears at baseline.  Pt would prefer to have RW at home (has been using [facility]) upon d/c for safety and recovery, otherwise no follow-up Physical Therapy or equipment needs. PT is signing off. Thank you for this referral. (See mobility section below for SPO2 however pt did not drop below 89% on room air.)      Recommendations for follow up therapy are one component of a multi-disciplinary discharge planning process, led by the attending physician.  Recommendations may be updated based on patient status, additional functional criteria and insurance authorization.  Follow Up Recommendations No PT follow up    Assistance Recommended at Discharge    Patient can return home with the following       Equipment Recommendations Rolling walker (2 wheels) (wide youth)  Recommendations for Other Services       Functional Status Assessment       Precautions / Restrictions Precautions Precautions: None      Mobility  Bed Mobility               General bed mobility comments: pt up in bathroom on arrival    Transfers Overall transfer level: Modified independent                      Ambulation/Gait Ambulation/Gait assistance: Supervision;Modified independent  (Device/Increase time) Gait Distance (Feet): 200 Feet Assistive device: Rolling walker (2 wheels) Gait Pattern/deviations: Step-through pattern;Decreased stride length       General Gait Details: pt utilized RW for more support in hallway, cues for rest breaks as needed due to SOB, SPO2 mostly upper 90s on room air, briefly down to 89% on room air however quickly improves to upper 90s with rest break, 96% room air upon return to room  Stairs            Wheelchair Mobility    Modified Rankin (Stroke Patients Only)       Balance                                             Pertinent Vitals/Pain Pain Assessment: No/denies pain    Home Living Family/patient expects to be discharged to:: Private residence Living Arrangements: Alone   Type of Home: Apartment Home Access: Stairs to enter   [employer] of Steps: "a couple"   Home Layout: One level Home Equipment: Cane - single point      Prior Function Prior Level of Function : Independent/Modified Independent             Mobility Comments: using SPC       Hand Dominance        Extremity/Trunk Assessment        Lower Extremity Assessment Lower Extremity Assessment: Overall  WFL for tasks assessed       Communication   Communication: No difficulties  Cognition Arousal/Alertness: Awake/alert Behavior During Therapy: WFL for tasks assessed/performed Overall Cognitive Status: Within Functional Limits for tasks assessed                                          General Comments      Exercises     Assessment/Plan    PT Assessment Patient does not need any further PT services  PT Problem List         PT Treatment Interventions      PT Goals (Current goals can be found in the Care Plan section)  Acute Rehab PT Goals PT Goal Formulation: All assessment and education complete, DC therapy    Frequency       Co-evaluation                AM-PAC PT "6 Clicks" Mobility  Outcome Measure Help needed turning from your back to your side while in a flat bed without using bedrails?: None Help needed moving from lying on your back to sitting on the side of a flat bed without using bedrails?: None Help needed moving to and from a bed to a chair (including a wheelchair)?: None Help needed standing up from a chair using your arms (e.g., wheelchair or bedside chair)?: None Help needed to walk in hospital room?: None Help needed climbing 3-5 steps with a railing? : A Little 6 Click Score: 23    End of Session Equipment Utilized During Treatment: Gait belt Activity Tolerance: Patient tolerated treatment well Patient left: in chair;with call bell/phone within reach Nurse Communication: Mobility status PT Visit Diagnosis: Difficulty in walking, not elsewhere classified (R26.2)    Time: 1352-1406 PT Time Calculation (min) (ACUTE ONLY): 14 min   Charges:   PT Evaluation $PT Eval Low Complexity: 1 Low        Kati PT, DPT Acute Rehabilitation Services Pager: [phone] Office: [place] 08/21/2021, 3:52 PM

## 2021-08-21 NOTE — Progress Notes (Signed)
Reviewed over AVS with patient, gave her additional info on Heart Failure such as diet, exercise, lifestyle & general education. Followup appointments reviewed as well as meds. Yvaetta the [facility] supervisor on call was contacted about pt needing a walker for pending d/c, she followed up on this for me & has arranged for that to be setup for delivery to home 1/17 am.Pt has coordinated someone to stay the night with her tonight.

## 2021-08-23 ENCOUNTER — Telehealth: Payer: Self-pay | Admitting: Cardiology

## 2021-08-23 NOTE — Telephone Encounter (Signed)
Pt c/o swelling: STAT is pt has developed SOB within 24 hours  How much weight have you gained and in what time span? 3lbs   If swelling, where is the swelling located? Swelling in left leg and foot   Are you currently taking a fluid pill? yes  Are you currently SOB? yes  Do you have a log of your daily weights (if so, list)?    Have you gained 3 pounds in a day or 5 pounds in a week? 1 day  Have you traveled recently? no

## 2021-08-23 NOTE — Telephone Encounter (Signed)
Spoke to the patient just now and she let me know that she went to the hospital on 01/13 for extreme fatigue, SOB, and swelling in her lower extremities. She states that was discharged on Monday. She is calling today due to having a 3 lb weight gain in 24 hours. She weighed 370 yesterday morning and 373 this morning. She tells me that she is SOB when up moving around but is fine when she is resting. She states that she has swelling in her left lower extremity. She is still taking the furosemide 40 mg daily. She was told by Dr. Avon Gully [facility] provider) to take her furosemide BID but decided that she did not want to do this until she spoke to Dr. Bettina Gavia. I advised her to take this BID as recommended and to continue keeping track of her weights while at home.   She is already scheduled to see Dr. Bettina Gavia in one week.

## 2021-08-24 ENCOUNTER — Ambulatory Visit: Payer: Managed Care, Other (non HMO) | Admitting: Cardiology

## 2021-08-24 NOTE — Telephone Encounter (Signed)
Pt c/o swelling: STAT is pt has developed SOB within 24 hours  How much weight have you gained and in what time span?  8 lbs in 2 days   If swelling, where is the swelling located?  Left leg/ankle  Are you currently taking a fluid pill?  Yes, states she was advised to take Furosemide 40 MG twice daily  Are you currently SOB?  No   Do you have a log of your daily weights (if so, list)?  01/18: 370 lbs 373 lbs 01/19: 378 lbs  Have you gained 3 pounds in a day or 5 pounds in a week?  8 lbs in 2 days   Have you traveled recently?  No    Patient is following up. She states she is still experiencing extreme swelling in her left leg and ankle, resulting in her gaining and additional 5 lbs in 1 day. She is requesting further recommendation.

## 2021-08-24 NOTE — Telephone Encounter (Signed)
Discussed this with Dr. Bettina Gavia directly and he advises that we get the patient in to see Dr. Agustin Cree tomorrow in [facility]. I added the patient on to Dr. Wendy Poet schedule for tomorrow at 9 am. Also advised the patient that she could go to [facility] if her swelling were to worsen and she needed urgent care before then.    Encouraged patient to call back with any questions or concerns.

## 2021-08-25 ENCOUNTER — Ambulatory Visit: Payer: Managed Care, Other (non HMO) | Admitting: Cardiology

## 2021-08-25 ENCOUNTER — Other Ambulatory Visit: Payer: Self-pay

## 2021-08-25 VITALS — BP 138/72 | HR 69 | Ht 60.75 in | Wt 376.0 lb

## 2021-08-25 DIAGNOSIS — I5031 Acute diastolic (congestive) heart failure: Secondary | ICD-10-CM | POA: Diagnosis not present

## 2021-08-25 DIAGNOSIS — R7303 Prediabetes: Secondary | ICD-10-CM

## 2021-08-25 DIAGNOSIS — I10 Essential (primary) hypertension: Secondary | ICD-10-CM

## 2021-08-25 DIAGNOSIS — Z6841 Body Mass Index (BMI) 40.0 and over, adult: Secondary | ICD-10-CM

## 2021-08-25 DIAGNOSIS — G4733 Obstructive sleep apnea (adult) (pediatric): Secondary | ICD-10-CM

## 2021-08-25 LAB — BASIC METABOLIC PANEL
BUN/Creatinine Ratio: 27 — ABNORMAL HIGH (ref 9–23)
BUN: 29 mg/dL — ABNORMAL HIGH (ref 6–24)
CO2: 33 mmol/L — ABNORMAL HIGH (ref 20–29)
Calcium: 9.5 mg/dL (ref 8.7–10.2)
Chloride: 96 mmol/L (ref 96–106)
Creatinine, Ser: 1.09 mg/dL — ABNORMAL HIGH (ref 0.57–1.00)
Glucose: 95 mg/dL (ref 70–99)
Potassium: 4.2 mmol/L (ref 3.5–5.2)
Sodium: 138 mmol/L (ref 134–144)
eGFR: 60 mL/min/{1.73_m2} (ref >59)

## 2021-08-25 NOTE — Progress Notes (Signed)
Cardiology Office Note:    Date:  08/25/2021   ID:  Christina Dominguez, DOB [birth year], MRN [id number]  PCP:  Lyman Bishop, DO  Cardiologist:  Jenne Campus, MD    Referring MD: Lyman Bishop, DO   Chief Complaint  Patient presents with   Shortness of Breath        Fatigue    History of Present Illness:    Christina Dominguez is a 56 y.o. female with past medical history significant for morbid obesity, most likely obstructive sleep apnea however no established diagnosis yet, diastolic congestive heart failure, prediabetes.  Recently she ended up being in the hospital because of decompensated CHF.  Look like this is diastolic phenomenon.  She is being very properly diuresed she lost about 17 pounds she was discharged home on Monday which was not even a week ago and she comes to my office for follow-up.  Overall swelling of lower extremities is minimal.  However she noticed that she gained about 8 pounds since she is being discharged from the hospital.  Initially she dropped couple more pounds and then she started gaining weight.  Shortness of breath is relatively stable however she complain of having weakness and fatigue.  No chest pain tightness squeezing pressure burning chest.  Past Medical History:  Diagnosis Date   Anxiety    Aortic atherosclerosis ([place]) 08/19/2021   Asthma    Bipolar 1 disorder (HCC)    CHF (congestive heart failure) (HCC)    Class 3 severe obesity with serious comorbidity and body mass index (BMI) greater than or equal to 70 in adult [facility]) 12/21/2019   Depression    Diastolic dysfunction 69/48/5462   Diverticulosis 08/19/2021   Edema of both lower extremities    Hepatic steatosis 08/19/2021   Hepatomegaly 08/19/2021   High blood pressure    Joint pain    Prediabetes 12/22/2019    Past Surgical History:  Procedure Laterality Date   NO PAST SURGERIES      Current Medications: Current Meds  Medication Sig   acetaminophen (TYLENOL) 650 MG CR  tablet Take 1,300 mg by mouth daily.   albuterol (VENTOLIN HFA) 108 (90 Base) MCG/ACT inhaler Inhale 1 puff into the lungs every 4 (four) hours as needed for wheezing or shortness of breath.   atorvastatin (LIPITOR) 40 MG tablet Take 1 tablet (40 mg total) by mouth daily.   CATS CLAW, UNCARIA TOMENTOSA, PO Take 2 capsules by mouth daily. Unknown strenght   diclofenac Sodium (VOLTAREN) 1 % GEL Apply 2 g topically daily as needed (pain).   furosemide (LASIX) 40 MG tablet Take 1 tablet (40 mg total) by mouth daily.   potassium chloride (KLOR-CON) 10 MEQ tablet Take 1 tablet (10 mEq total) by mouth daily.   valsartan (DIOVAN) 40 MG tablet Take 1 tablet (40 mg total) by mouth daily.     Allergies:   Aspirin, Eggs or egg-derived products, and Other   Social History   Socioeconomic History   Marital status: Single    Spouse name: Not on file   Number of children: Not on file   Years of education: Not on file   Highest education level: Not on file  Occupational History   Occupation: Medical illustrator, work from home  Tobacco Use   Smoking status: Never   Smokeless tobacco: Never  Vaping Use   Vaping Use: Never used  Substance and Sexual Activity   Alcohol use: Yes    Comment: occ  Drug use: Never   Sexual activity: Not on file  Other Topics Concern   Not on file  Social History Narrative   Not on file   Social Determinants of Health   Financial Resource Strain: Not on file  Food Insecurity: Not on file  Transportation Needs: Not on file  Physical Activity: Not on file  Stress: Not on file  Social Connections: Not on file     Family History: The patient's family history includes Bone cancer in her father; Colon cancer in her maternal grandfather; Dementia in her paternal grandmother; Diabetes in her brother; Heart Problems in her mother; Heart attack in her sister; Hypercholesterolemia in her mother; Hypertension in her mother; Testicular cancer in her father. ROS:   Please  see the history of present illness.    All 14 point review of systems negative except as described per history of present illness  EKGs/Labs/Other Studies Reviewed:      Recent Labs: 08/09/2021: NT-Pro BNP 106 08/18/2021: ALT 17; B Natriuretic Peptide 25.3 08/21/2021: BUN 26; Creatinine, Ser 1.00; Hemoglobin 12.7; Platelets 371; Potassium 3.3; Sodium 137  Recent Lipid Panel    Component Value Date/Time   CHOL 158 12/21/2019 1350   TRIG 58 12/21/2019 1350   HDL 58 12/21/2019 1350   LDLCALC 88 12/21/2019 1350    Physical Exam:    VS:  BP 138/72 (BP Location: Left Arm, Patient Position: Sitting)    Pulse 69    Ht 5' 0.75" (1.543 m)    Wt (!) 376 lb (170.6 kg)    SpO2 93%    BMI 71.63 kg/m     Wt Readings from Last 3 Encounters:  08/25/21 (!) 376 lb (170.6 kg)  08/21/21 (!) 375 lb 7.1 oz (170.3 kg)  08/09/21 (!) 385 lb (174.6 kg)     GEN:  Well nourished, well developed in no acute distress HEENT: Normal NECK: No JVD; No carotid bruits LYMPHATICS: No lymphadenopathy CARDIAC: RRR, no murmurs, no rubs, no gallops RESPIRATORY:  Clear to auscultation without rales, wheezing or rhonchi  ABDOMEN: Soft, non-tender, non-distended MUSCULOSKELETAL:  No edema; No deformity  SKIN: Warm and dry LOWER EXTREMITIES: no swelling NEUROLOGIC:  Alert and oriented x 3 PSYCHIATRIC:  Normal affect   ASSESSMENT:    1. Acute diastolic congestive heart failure ([place])   2. Essential hypertension   3. Prediabetes   4. Class 3 severe obesity due to excess calories with serious comorbidity and body mass index (BMI) greater than or equal to 70 in adult ([place])   5. Obstructive sleep apnea    PLAN:    In order of problems listed above:  Diastolic congestive heart failure.  She seems to be compensated at this stage.  I asked to check Chem-7 as well as proBNP based on that we will decide what to do about diuretics also told her to check her weight in the regular basis in the morning like she does so far  and I told her if she gained another 5 pounds she need to increase dose of furosemide to 1-1/2 tablets twice daily and doubling the dose of potassium however I reluctant to give her too much diuretic until I get Chem-7 which probably will get results on Friday.  I suspect strongly that she have a sleep apnea and she need to be evaluated for disc there is therefore we will order a sleep study. Essential hypertension blood pressure well controlled continue present medications. Prediabetes obviously weight loss will be critically essential.  She is scheduled to see weight management clinic will should be tremendously beneficial for her. I did review record from hospital for this visit   Medication Adjustments/Labs and Tests Ordered: Current medicines are reviewed at length with the patient today.  Concerns regarding medicines are outlined above.  No orders of the defined types were placed in this encounter.  Medication changes: No orders of the defined types were placed in this encounter.   Signed, Park Liter, MD, [facility] 08/25/2021 9:35 AM    [place]

## 2021-08-25 NOTE — Patient Instructions (Addendum)
Medication Instructions:   Your physician recommends that you continue on your current medications as directed. Please refer to the Current Medication list given to you today.  (If your  weight go up by 5 pounds in a week  increase dose of diuretics from 40 mg of Lasix twice daily to 60 mg twice daily and also  double  the dose of potassium from 10 meq daily to 20 meq daily )  *If you need a refill on your cardiac medications before your next appointment, please call your pharmacy*   If you have labs (blood work) drawn today and your tests are completely normal, you will receive your results only by: [place] (if you have MyChart) OR A paper copy in the mail If you have any lab test that is abnormal or we need to change your treatment, we will call you to review the results.  Labs: BMET ND BNP TODAY   Testing/Procedures: NONE ORDERED  TODAY   Follow-Up: At [facility], you and your health needs are our priority.  As part of our continuing mission to provide you with exceptional heart care, we have created designated Provider Care Teams.  These Care Teams include your primary Cardiologist (physician) and Advanced Practice Providers (APPs -  Physician Assistants and Nurse Practitioners) who all work together to provide you with the care you need, when you need it.  We recommend signing up for the patient portal called "MyChart".  Sign up information is provided on this After Visit Summary.  MyChart is used to connect with patients for Virtual Visits (Telemedicine).  Patients are able to view lab/test results, encounter notes, upcoming appointments, etc.  Non-urgent messages can be sent to your provider as well.   To learn more about what you can do with MyChart, go to [email or web address].    Your next appointment:   4 week(s)  The format for your next appointment:   In Person  Provider:   Jenne Campus, MD    Other Instructions

## 2021-08-26 LAB — PRO B NATRIURETIC PEPTIDE: NT-Pro BNP: 38 pg/mL (ref 0–287)

## 2021-08-30 ENCOUNTER — Ambulatory Visit: Payer: Managed Care, Other (non HMO) | Admitting: Cardiology

## 2021-08-30 ENCOUNTER — Encounter: Payer: Self-pay | Admitting: Physician Assistant

## 2021-08-30 ENCOUNTER — Other Ambulatory Visit: Payer: Self-pay

## 2021-08-30 ENCOUNTER — Ambulatory Visit: Payer: Managed Care, Other (non HMO) | Admitting: Physician Assistant

## 2021-08-30 VITALS — BP 124/74 | HR 86 | Temp 98.1°F | Ht 60.75 in | Wt 378.4 lb

## 2021-08-30 DIAGNOSIS — R0609 Other forms of dyspnea: Secondary | ICD-10-CM

## 2021-08-30 DIAGNOSIS — J4520 Mild intermittent asthma, uncomplicated: Secondary | ICD-10-CM | POA: Diagnosis not present

## 2021-08-30 DIAGNOSIS — Z1211 Encounter for screening for malignant neoplasm of colon: Secondary | ICD-10-CM

## 2021-08-30 DIAGNOSIS — Z6841 Body Mass Index (BMI) 40.0 and over, adult: Secondary | ICD-10-CM

## 2021-08-30 NOTE — Progress Notes (Deleted)
Subjective:    Patient ID: Christina Dominguez, female    DOB: [birth year], 56 y.o.   MRN: [id number]  Chief Complaint  Patient presents with   Congestive Heart Failure    Went to ED-Dx was CHF and may have sleep apnea      HPI 56 y.o. patient presents today for new patient establishment with me.  Patient was previously established with Dr. Curly Rim with [facility].  Current Care Team: [facility] Cardiology   Acute Concerns: Suggested that she see a pulmonologist for asthma -Extreme heat or cold seems to flare up her asthma  [facility] scheduled her for a sleep study as well.   Chronic Concerns: See PMH listed below, as well as A/P for details on issues we specifically discussed during today's visit.      Past Medical History:  Diagnosis Date   Anxiety    Aortic atherosclerosis ([place]) 08/19/2021   Asthma    Bipolar 1 disorder (HCC)    CHF (congestive heart failure) (HCC)    Class 3 severe obesity with serious comorbidity and body mass index (BMI) greater than or equal to 70 in adult [facility]) 12/21/2019   Depression    Diastolic dysfunction 50/10/7046   Diverticulosis 08/19/2021   Edema of both lower extremities    Hepatic steatosis 08/19/2021   Hepatomegaly 08/19/2021   High blood pressure    Joint pain    Prediabetes 12/22/2019    Past Surgical History:  Procedure Laterality Date   NO PAST SURGERIES      Family History  Problem Relation Age of Onset   Heart Problems Mother        Broken heart syndrome   Hypertension Mother    Hypercholesterolemia Mother    Bone cancer Father    Testicular cancer Father    Heart attack Sister    Diabetes Brother    Colon cancer Maternal Grandfather    Dementia Paternal Grandmother     Social History   Tobacco Use   Smoking status: Never   Smokeless tobacco: Never  Vaping Use   Vaping Use: Never used  Substance Use Topics   Alcohol use: Yes    Alcohol/week: 2.0 standard drinks    Types: 1 Glasses of wine, 1  Standard drinks or equivalent per week    Comment: occ   Drug use: Never     Allergies  Allergen Reactions   Aspirin    Eggs Or Egg-Derived Products Swelling   Other Rash    Pt states she is allergic to eggs and flu vaccine - swelling    Review of Systems NEGATIVE UNLESS OTHERWISE INDICATED IN HPI      Objective:     BP 124/74    Pulse 86    Temp 98.1 F (36.7 C)    Ht 5' 0.75" (1.543 m)    Wt (!) 378 lb 6.4 oz (171.6 kg)    SpO2 98%    BMI 72.09 kg/m   Wt Readings from Last 3 Encounters:  08/30/21 (!) 378 lb 6.4 oz (171.6 kg)  08/25/21 (!) 376 lb (170.6 kg)  08/21/21 (!) 375 lb 7.1 oz (170.3 kg)    BP Readings from Last 3 Encounters:  08/30/21 124/74  08/25/21 138/72  08/21/21 137/81     Physical Exam     Assessment & Plan:   Problem List Items Addressed This Visit   None    No orders of the defined types were placed in this encounter.  This note was prepared with assistance of Systems analyst. Occasional wrong-word or sound-a-like substitutions may have occurred due to the inherent limitations of voice recognition software.  Time Spent: *** minutes of total time was spent on the date of the encounter performing the following actions: chart review prior to seeing the patient, obtaining history, performing a medically necessary exam, counseling on the treatment plan, placing orders, and documenting in our EHR.    Maniah Nading M Angelea Penny, PA-C

## 2021-08-30 NOTE — Patient Instructions (Signed)
Good to meet you today! I have placed an order for Cologuard, which you should be contacted about. I have also sent a referral to see our pulmonology group.  Keep working towards your lifestyle goals!  Call if any concerns

## 2021-09-01 ENCOUNTER — Other Ambulatory Visit: Payer: Self-pay

## 2021-09-01 ENCOUNTER — Encounter (HOSPITAL_BASED_OUTPATIENT_CLINIC_OR_DEPARTMENT_OTHER): Payer: Self-pay

## 2021-09-01 ENCOUNTER — Ambulatory Visit (HOSPITAL_BASED_OUTPATIENT_CLINIC_OR_DEPARTMENT_OTHER)
Admission: RE | Admit: 2021-09-01 | Discharge: 2021-09-01 | Disposition: A | Discharge status: Home / Self Care | Payer: Managed Care, Other (non HMO) | Source: Ambulatory Visit | Attending: Cardiology | Admitting: Cardiology

## 2021-09-01 DIAGNOSIS — I509 Heart failure, unspecified: Secondary | ICD-10-CM

## 2021-09-01 DIAGNOSIS — R0602 Shortness of breath: Secondary | ICD-10-CM

## 2021-09-03 NOTE — Progress Notes (Signed)
Subjective:    Patient ID: Christina Dominguez, female    DOB: 10-Jun-1966, 56 y.o.   MRN: [id number]  Chief Complaint  Patient presents with   Congestive Heart Failure    Went to ED-Dx was CHF and may have sleep apnea      Patient is a 56 yo female in today for new patient establishment with me. She is requesting a referral to pulmonology for asthma and DOE, and also an order for Cologuard for colon cancer screening.   She has a BMI of 72 and is currently a patient with [facility]. She also has a cardiologist, Dr. Rondel Oh.  Pt was recently hospitalized from 1/13 to 08/21/2021 at [facility] for acute congestive heart failure. She followed up with Dr. Agustin Cree on 08/25/21. She is feeling back to her baseline since her hospital visit. No other acute concerns to address today.    Past Medical History:  Diagnosis Date   Anxiety    Aortic atherosclerosis ([place]) 08/19/2021   Asthma    Bipolar 1 disorder (HCC)    CHF (congestive heart failure) (HCC)    Class 3 severe obesity with serious comorbidity and body mass index (BMI) greater than or equal to 70 in adult [facility]) 12/21/2019   Depression    Diastolic dysfunction 86/57/8469   Diverticulosis 08/19/2021   Edema of both lower extremities    Hepatic steatosis 08/19/2021   Hepatomegaly 08/19/2021   High blood pressure    Joint pain    Prediabetes 12/22/2019    Past Surgical History:  Procedure Laterality Date   NO PAST SURGERIES      Family History  Problem Relation Age of Onset   Heart Problems Mother        Broken heart syndrome   Hypertension Mother    Hypercholesterolemia Mother    Bone cancer Father    Testicular cancer Father    Heart attack Sister    Diabetes Brother    Colon cancer Maternal Grandfather    Dementia Paternal Grandmother     Social History   Tobacco Use   Smoking status: Never   Smokeless tobacco: Never  Vaping Use   Vaping Use: Never used  Substance Use Topics    Alcohol use: Yes    Alcohol/week: 2.0 standard drinks    Types: 1 Glasses of wine, 1 Standard drinks or equivalent per week    Comment: occ   Drug use: Never     Allergies  Allergen Reactions   Aspirin    Eggs Or Egg-Derived Products Swelling   Other Rash    Pt states she is allergic to eggs and flu vaccine - swelling    Review of Systems NEGATIVE UNLESS OTHERWISE INDICATED IN HPI      Objective:     BP 124/74    Pulse 86    Temp 98.1 F (36.7 C)    Ht 5' 0.75" (1.543 m)    Wt (!) 378 lb 6.4 oz (171.6 kg)    SpO2 98%    BMI 72.09 kg/m   Wt Readings from Last 3 Encounters:  08/30/21 (!) 378 lb 6.4 oz (171.6 kg)  08/25/21 (!) 376 lb (170.6 kg)  08/21/21 (!) 375 lb 7.1 oz (170.3 kg)    BP Readings from Last 3 Encounters:  08/30/21 124/74  08/25/21 138/72  08/21/21 137/81     Physical Exam Vitals and nursing note reviewed.  Constitutional:      Appearance: Normal appearance. She  is obese. She is not toxic-appearing.  HENT:     Head: Normocephalic and atraumatic.     Right Ear: External ear normal.     Left Ear: External ear normal.     Nose: Nose normal.     Mouth/Throat:     Mouth: Mucous membranes are moist.  Eyes:     Extraocular Movements: Extraocular movements intact.     Conjunctiva/sclera: Conjunctivae normal.     Pupils: Pupils are equal, round, and reactive to light.  Cardiovascular:     Rate and Rhythm: Normal rate and regular rhythm.     Pulses: Normal pulses.     Heart sounds: Normal heart sounds.  Pulmonary:     Effort: Pulmonary effort is normal.     Breath sounds: Normal breath sounds.  Musculoskeletal:        General: Normal range of motion.     Cervical back: Normal range of motion and neck supple.  Skin:    General: Skin is warm and dry.  Neurological:     General: No focal deficit present.     Mental Status: She is alert and oriented to person, place, and time.  Psychiatric:        Mood and Affect: Mood normal.        Behavior:  Behavior normal.        Thought Content: Thought content normal.        Judgment: Judgment normal.       Assessment & Plan:   Problem List Items Addressed This Visit       Respiratory   Mild intermittent asthma without complication   Other Visit Diagnoses     Dyspnea on exertion    -  Primary   Screening for colon cancer       Relevant Orders   Cologuard       1. Dyspnea on exertion 2. Mild intermittent asthma without complication -DOE could be multifactorial with new dx of CHF, as well as morbid obesity, and hx of intermittent asthma. -Pt is going to have a sleep study done (ordered by cardiologist) -I will place order for pulmonology as requested by patient -She has albuterol inhaler to use as needed  3. Screening for colon cancer - Order placed for Cologuard  4. Class 3 severe obesity due to excess calories with serious comorbidity and body mass index (BMI) greater than or equal to 70 in adult [facility]) -She is following up with [facility] -Weight loss will be critical for patient, encouraged her to keep working towards goals   F/up in 6 months for fasting labs and CPE    Alaiza Yau M Baruc Tugwell, PA-C

## 2021-09-13 ENCOUNTER — Telehealth: Payer: Self-pay | Admitting: Physician Assistant

## 2021-09-13 NOTE — Telephone Encounter (Signed)
Dr. Raul Del, Bariatric Surgeon w/[facility] at [facility] at [phone].  will need a medical clearance and cardiologist clearance.  Pharmacist, community at [facility] [place] [facility] [place] Type Call Who Is Calling Physician / Provider / Hospital Call Type Provider Call Message Only Reason for Call Request to send message to Office Initial Comment Caller states is Dr. Raul Del, Bariatric Surgeon w/[facility] at [facility] at [phone]. PA Alyssa Eardhart referred Pt - Christina Dominguez Dob - Oct 29, 1965, will need a medical clearance and cardiologist clearance. Disp. Time Disposition Final User 09/12/2021 5:13:09 PM General Information Provided Yes Delton Coombes Call Closed By: Delton Coombes Transaction Date/Time: 09/12/2021 5:06:29 PM (ET)

## 2021-09-13 NOTE — Telephone Encounter (Signed)
Spoke with patient to schedule clearance

## 2021-09-14 NOTE — Progress Notes (Signed)
TOC supervisor received notification that patient never received her walker post discharge.  CM reached out to [facility], provided referral information, rep will process and ship walker to home.  CM spoke with patient and provided update.

## 2021-09-21 ENCOUNTER — Ambulatory Visit: Payer: Managed Care, Other (non HMO) | Admitting: Family

## 2021-09-21 ENCOUNTER — Ambulatory Visit (HOSPITAL_BASED_OUTPATIENT_CLINIC_OR_DEPARTMENT_OTHER): Payer: Managed Care, Other (non HMO)

## 2021-09-21 ENCOUNTER — Encounter (HOSPITAL_BASED_OUTPATIENT_CLINIC_OR_DEPARTMENT_OTHER): Payer: Self-pay

## 2021-09-26 ENCOUNTER — Encounter: Payer: Self-pay | Admitting: Cardiology

## 2021-09-26 ENCOUNTER — Ambulatory Visit (INDEPENDENT_AMBULATORY_CARE_PROVIDER_SITE_OTHER): Payer: Managed Care, Other (non HMO) | Admitting: Cardiology

## 2021-09-26 ENCOUNTER — Other Ambulatory Visit: Payer: Self-pay

## 2021-09-26 VITALS — BP 128/78 | HR 80 | Ht 60.08 in | Wt 383.0 lb

## 2021-09-26 DIAGNOSIS — I10 Essential (primary) hypertension: Secondary | ICD-10-CM | POA: Diagnosis not present

## 2021-09-26 DIAGNOSIS — I5032 Chronic diastolic (congestive) heart failure: Secondary | ICD-10-CM | POA: Diagnosis not present

## 2021-09-26 DIAGNOSIS — K760 Fatty (change of) liver, not elsewhere classified: Secondary | ICD-10-CM

## 2021-09-26 DIAGNOSIS — Z6841 Body Mass Index (BMI) 40.0 and over, adult: Secondary | ICD-10-CM

## 2021-09-26 NOTE — Progress Notes (Signed)
Cardiology Office Note:    Date:  09/26/2021   ID:  Christina Dominguez, DOB May 28, 1966, MRN [id number]  PCP:  Fredirick Lathe, PA-C  Cardiologist:  Jenne Campus, MD    Referring MD: Lyman Bishop, DO   No chief complaint on file. I think when retaining fluid again  History of Present Illness:    Christina Dominguez is a 56 y.o. female with past medical history significant for morbid obesity, diastolic congestive heart failure, prediabetes, essential hypertension.  She comes today to my office for follow-up.  Doing well.  Cardiac wise doing well but she gained few pounds and she thinks she is retaining some more fluids.  She goes to gym on the regular basis she does walk in the water.  She is also trying to spend some time after.      Past Medical History:  Diagnosis Date   Anxiety    Aortic atherosclerosis ([place]) 08/19/2021   Asthma    Bipolar 1 disorder (HCC)    CHF (congestive heart failure) (HCC)    Class 3 severe obesity with serious comorbidity and body mass index (BMI) greater than or equal to 70 in adult [facility]) 12/21/2019   Depression    Diastolic dysfunction 66/44/0347   Diverticulosis 08/19/2021   Edema of both lower extremities    Hepatic steatosis 08/19/2021   Hepatomegaly 08/19/2021   High blood pressure    Joint pain    Prediabetes 12/22/2019    Past Surgical History:  Procedure Laterality Date   NO PAST SURGERIES      Current Medications: Current Meds  Medication Sig   acetaminophen (TYLENOL) 650 MG CR tablet Take 1,300 mg by mouth daily.   albuterol (VENTOLIN HFA) 108 (90 Base) MCG/ACT inhaler Inhale 1 puff into the lungs every 4 (four) hours as needed for wheezing or shortness of breath.   CATS CLAW, UNCARIA TOMENTOSA, PO Take 2 capsules by mouth daily. Unknown strenght   diclofenac Sodium (VOLTAREN) 1 % GEL Apply 2 g topically daily as needed (pain).   furosemide (LASIX) 40 MG tablet Take 1 tablet (40 mg total) by mouth daily. (Patient taking  differently: Take 40 mg by mouth daily. Takes one and one-half tablet (60 mg) daily)   potassium chloride (KLOR-CON) 10 MEQ tablet Take 1 tablet (10 mEq total) by mouth daily.   valsartan (DIOVAN) 40 MG tablet Take 1 tablet (40 mg total) by mouth daily.     Allergies:   Aspirin, Eggs or egg-derived products, and Other   Social History   Socioeconomic History   Marital status: Single    Spouse name: Not on file   Number of children: Not on file   Years of education: Not on file   Highest education level: Not on file  Occupational History   Occupation: Medical illustrator, work from home  Tobacco Use   Smoking status: Never    Passive exposure: Never   Smokeless tobacco: Never  Vaping Use   Vaping Use: Never used  Substance and Sexual Activity   Alcohol use: Yes    Alcohol/week: 2.0 standard drinks    Types: 1 Glasses of wine, 1 Standard drinks or equivalent per week    Comment: occ   Drug use: Never   Sexual activity: Not on file  Other Topics Concern   Not on file  Social History Narrative   Not on file   Social Determinants of Health   Financial Resource Strain: Not on file  Food Insecurity:  Not on file  Transportation Needs: Not on file  Physical Activity: Not on file  Stress: Not on file  Social Connections: Not on file     Family History: The patient's family history includes Bone cancer in her father; Colon cancer in her maternal grandfather; Dementia in her paternal grandmother; Diabetes in her brother; Heart Problems in her mother; Heart attack in her sister; Hypercholesterolemia in her mother; Hypertension in her mother; Testicular cancer in her father. ROS:   Please see the history of present illness.    All 14 point review of systems negative except as described per history of present illness  EKGs/Labs/Other Studies Reviewed:      Recent Labs: 08/18/2021: ALT 17; B Natriuretic Peptide 25.3 08/21/2021: Hemoglobin 12.7; Platelets 371 08/25/2021: BUN 29;  Creatinine, Ser 1.09; NT-Pro BNP 38; Potassium 4.2; Sodium 138  Recent Lipid Panel    Component Value Date/Time   CHOL 158 12/21/2019 1350   TRIG 58 12/21/2019 1350   HDL 58 12/21/2019 1350   LDLCALC 88 12/21/2019 1350    Physical Exam:    VS:  BP 128/78 (BP Location: Left Wrist)    Pulse 80    Ht 5' 0.08" (1.526 m)    Wt (!) 383 lb (173.7 kg)    SpO2 97%    BMI 74.61 kg/m     Wt Readings from Last 3 Encounters:  09/26/21 (!) 383 lb (173.7 kg)  08/30/21 (!) 378 lb 6.4 oz (171.6 kg)  08/25/21 (!) 376 lb (170.6 kg)     GEN:  Well nourished, well developed in no acute distress HEENT: Normal NECK: No JVD; No carotid bruits LYMPHATICS: No lymphadenopathy CARDIAC: RRR, no murmurs, no rubs, no gallops RESPIRATORY:  Clear to auscultation without rales, wheezing or rhonchi  ABDOMEN: Soft, non-tender, non-distended MUSCULOSKELETAL:  No edema; No deformity  SKIN: Warm and dry LOWER EXTREMITIES: no swelling NEUROLOGIC:  Alert and oriented x 3 PSYCHIATRIC:  Normal affect   ASSESSMENT:    1. Chronic diastolic congestive heart failure ([place])   2. Essential hypertension   3. Hepatic steatosis   4. Class 3 severe obesity due to excess calories with serious comorbidity and body mass index (BMI) greater than or equal to 70 in adult [facility])    PLAN:    In order of problems listed above:  Chronic diastolic congestive heart failure physical exam difficult to judge because of morbid obesity.  I will check proBNP.  I will do Chem-7 to see if there is room to augment her diuretics. Essential hypertension blood pressure seems to well controlled continue present management.  Severe obesity obesity acute problem.  She understands she is trying to exercise she is doing well and I encouraged her to keep doing it. Again I have difficulty assessing her congestive heart failure suspect she may be mildly decompensated proBNP will be done to help with assessment.   Medication Adjustments/Labs and Tests  Ordered: Current medicines are reviewed at length with the patient today.  Concerns regarding medicines are outlined above.  No orders of the defined types were placed in this encounter.  Medication changes: No orders of the defined types were placed in this encounter.   Signed, Park Liter, MD, [facility] 09/26/2021 11:20 AM    [place]

## 2021-09-26 NOTE — Patient Instructions (Signed)
Medication Instructions:  Your physician recommends that you continue on your current medications as directed. Please refer to the Current Medication list given to you today.  *If you need a refill on your cardiac medications before your next appointment, please call your pharmacy*   Lab Work: Your physician recommends that you return for lab work in:   Labs today: BMP, Pro BNP  If you have labs (blood work) drawn today and your tests are completely normal, you will receive your results only by: MyChart Message (if you have MyChart) OR A paper copy in the mail If you have any lab test that is abnormal or we need to change your treatment, we will call you to review the results.   Testing/Procedures: None   Follow-Up: At [facility], you and your health needs are our priority.  As part of our continuing mission to provide you with exceptional heart care, we have created designated Provider Care Teams.  These Care Teams include your primary Cardiologist (physician) and Advanced Practice Providers (APPs -  Physician Assistants and Nurse Practitioners) who all work together to provide you with the care you need, when you need it.  We recommend signing up for the patient portal called "MyChart".  Sign up information is provided on this After Visit Summary.  MyChart is used to connect with patients for Virtual Visits (Telemedicine).  Patients are able to view lab/test results, encounter notes, upcoming appointments, etc.  Non-urgent messages can be sent to your provider as well.   To learn more about what you can do with MyChart, go to [email or web address].    Your next appointment:   4 month(s)  The format for your next appointment:   In Person  Provider:   Jenne Campus, MD    Other Instructions None

## 2021-09-26 NOTE — Addendum Note (Signed)
Addended by: Edwyna Shell I on: 09/26/2021 11:31 AM   Modules accepted: Orders

## 2021-09-27 LAB — BASIC METABOLIC PANEL
BUN/Creatinine Ratio: 19 (ref 9–23)
BUN: 16 mg/dL (ref 6–24)
CO2: 26 mmol/L (ref 20–29)
Calcium: 9.2 mg/dL (ref 8.7–10.2)
Chloride: 103 mmol/L (ref 96–106)
Creatinine, Ser: 0.85 mg/dL (ref 0.57–1.00)
Glucose: 78 mg/dL (ref 70–99)
Potassium: 4.1 mmol/L (ref 3.5–5.2)
Sodium: 142 mmol/L (ref 134–144)
eGFR: 80 mL/min/{1.73_m2} (ref >59)

## 2021-09-27 LAB — PRO B NATRIURETIC PEPTIDE: NT-Pro BNP: 115 pg/mL (ref 0–287)

## 2021-10-03 ENCOUNTER — Inpatient Hospital Stay (HOSPITAL_BASED_OUTPATIENT_CLINIC_OR_DEPARTMENT_OTHER)
Admission: EM | Admit: 2021-10-03 | Discharge: 2021-10-11 | DRG: 286 | Disposition: A | Discharge status: Home / Self Care | Payer: Commercial Managed Care - HMO | Attending: Internal Medicine | Admitting: Internal Medicine

## 2021-10-03 ENCOUNTER — Other Ambulatory Visit: Payer: Self-pay

## 2021-10-03 ENCOUNTER — Encounter (HOSPITAL_BASED_OUTPATIENT_CLINIC_OR_DEPARTMENT_OTHER): Payer: Self-pay

## 2021-10-03 ENCOUNTER — Emergency Department (HOSPITAL_BASED_OUTPATIENT_CLINIC_OR_DEPARTMENT_OTHER): Payer: Commercial Managed Care - HMO

## 2021-10-03 DIAGNOSIS — J9859 Other diseases of mediastinum, not elsewhere classified: Secondary | ICD-10-CM | POA: Diagnosis present

## 2021-10-03 DIAGNOSIS — I10 Essential (primary) hypertension: Secondary | ICD-10-CM | POA: Diagnosis present

## 2021-10-03 DIAGNOSIS — F319 Bipolar disorder, unspecified: Secondary | ICD-10-CM | POA: Diagnosis present

## 2021-10-03 DIAGNOSIS — E876 Hypokalemia: Secondary | ICD-10-CM | POA: Diagnosis not present

## 2021-10-03 DIAGNOSIS — Z886 Allergy status to analgesic agent status: Secondary | ICD-10-CM

## 2021-10-03 DIAGNOSIS — D4989 Neoplasm of unspecified behavior of other specified sites: Secondary | ICD-10-CM

## 2021-10-03 DIAGNOSIS — F419 Anxiety disorder, unspecified: Secondary | ICD-10-CM | POA: Diagnosis present

## 2021-10-03 DIAGNOSIS — Z83438 Family history of other disorder of lipoprotein metabolism and other lipidemia: Secondary | ICD-10-CM

## 2021-10-03 DIAGNOSIS — E871 Hypo-osmolality and hyponatremia: Secondary | ICD-10-CM | POA: Diagnosis not present

## 2021-10-03 DIAGNOSIS — E785 Hyperlipidemia, unspecified: Secondary | ICD-10-CM | POA: Diagnosis present

## 2021-10-03 DIAGNOSIS — I110 Hypertensive heart disease with heart failure: Principal | ICD-10-CM | POA: Diagnosis present

## 2021-10-03 DIAGNOSIS — I700 Atherosclerosis of aorta: Secondary | ICD-10-CM | POA: Diagnosis present

## 2021-10-03 DIAGNOSIS — G4733 Obstructive sleep apnea (adult) (pediatric): Secondary | ICD-10-CM | POA: Diagnosis present

## 2021-10-03 DIAGNOSIS — R222 Localized swelling, mass and lump, trunk: Secondary | ICD-10-CM | POA: Diagnosis present

## 2021-10-03 DIAGNOSIS — J069 Acute upper respiratory infection, unspecified: Secondary | ICD-10-CM

## 2021-10-03 DIAGNOSIS — Z79899 Other long term (current) drug therapy: Secondary | ICD-10-CM

## 2021-10-03 DIAGNOSIS — E662 Morbid (severe) obesity with alveolar hypoventilation: Secondary | ICD-10-CM | POA: Diagnosis present

## 2021-10-03 DIAGNOSIS — R0601 Orthopnea: Secondary | ICD-10-CM | POA: Diagnosis not present

## 2021-10-03 DIAGNOSIS — J9622 Acute and chronic respiratory failure with hypercapnia: Secondary | ICD-10-CM | POA: Diagnosis present

## 2021-10-03 DIAGNOSIS — I959 Hypotension, unspecified: Secondary | ICD-10-CM | POA: Diagnosis not present

## 2021-10-03 DIAGNOSIS — I5082 Biventricular heart failure: Secondary | ICD-10-CM | POA: Diagnosis present

## 2021-10-03 DIAGNOSIS — I5033 Acute on chronic diastolic (congestive) heart failure: Secondary | ICD-10-CM | POA: Diagnosis present

## 2021-10-03 DIAGNOSIS — I2729 Other secondary pulmonary hypertension: Secondary | ICD-10-CM | POA: Diagnosis present

## 2021-10-03 DIAGNOSIS — J9621 Acute and chronic respiratory failure with hypoxia: Secondary | ICD-10-CM | POA: Diagnosis present

## 2021-10-03 DIAGNOSIS — R0609 Other forms of dyspnea: Secondary | ICD-10-CM

## 2021-10-03 DIAGNOSIS — Z20822 Contact with and (suspected) exposure to covid-19: Secondary | ICD-10-CM | POA: Diagnosis present

## 2021-10-03 DIAGNOSIS — J4520 Mild intermittent asthma, uncomplicated: Secondary | ICD-10-CM | POA: Diagnosis present

## 2021-10-03 DIAGNOSIS — Z6841 Body Mass Index (BMI) 40.0 and over, adult: Secondary | ICD-10-CM

## 2021-10-03 DIAGNOSIS — Z91012 Allergy to eggs: Secondary | ICD-10-CM

## 2021-10-03 DIAGNOSIS — E66813 Obesity, class 3: Secondary | ICD-10-CM

## 2021-10-03 DIAGNOSIS — U071 COVID-19: Secondary | ICD-10-CM | POA: Diagnosis not present

## 2021-10-03 DIAGNOSIS — Z8249 Family history of ischemic heart disease and other diseases of the circulatory system: Secondary | ICD-10-CM

## 2021-10-03 DIAGNOSIS — R079 Chest pain, unspecified: Secondary | ICD-10-CM

## 2021-10-03 DIAGNOSIS — R0600 Dyspnea, unspecified: Secondary | ICD-10-CM

## 2021-10-03 LAB — CBC WITH DIFFERENTIAL/PLATELET
Abs Immature Granulocytes: 0.04 10*3/uL (ref 0.00–0.07)
Basophils Absolute: 0 10*3/uL (ref 0.0–0.1)
Basophils Relative: 0 %
Eosinophils Absolute: 0.2 10*3/uL (ref 0.0–0.5)
Eosinophils Relative: 2 %
HCT: 36.4 % (ref 36.0–46.0)
Hemoglobin: 11.7 g/dL — ABNORMAL LOW (ref 12.0–15.0)
Immature Granulocytes: 0 %
Lymphocytes Relative: 20 %
Lymphs Abs: 2.1 10*3/uL (ref 0.7–4.0)
MCH: 29.7 pg (ref 26.0–34.0)
MCHC: 32.1 g/dL (ref 30.0–36.0)
MCV: 92.4 fL (ref 80.0–100.0)
Monocytes Absolute: 0.7 10*3/uL (ref 0.1–1.0)
Monocytes Relative: 6 %
Neutro Abs: 7.6 10*3/uL (ref 1.7–7.7)
Neutrophils Relative %: 72 %
Platelets: 352 10*3/uL (ref 150–400)
RBC: 3.94 MIL/uL (ref 3.87–5.11)
RDW: 15 % (ref 11.5–15.5)
WBC: 10.7 10*3/uL — ABNORMAL HIGH (ref 4.0–10.5)
nRBC: 0 % (ref 0.0–0.2)

## 2021-10-03 LAB — COMPREHENSIVE METABOLIC PANEL
ALT: 19 U/L (ref 0–44)
AST: 20 U/L (ref 15–41)
Albumin: 3.8 g/dL (ref 3.5–5.0)
Alkaline Phosphatase: 117 U/L (ref 38–126)
Anion gap: 9 (ref 5–15)
BUN: 19 mg/dL (ref 6–20)
CO2: 30 mmol/L (ref 22–32)
Calcium: 8.7 mg/dL — ABNORMAL LOW (ref 8.9–10.3)
Chloride: 96 mmol/L — ABNORMAL LOW (ref 98–111)
Creatinine, Ser: 0.95 mg/dL (ref 0.44–1.00)
GFR, Estimated: >60 mL/min (ref >60)
Glucose, Bld: 92 mg/dL (ref 70–99)
Potassium: 3.8 mmol/L (ref 3.5–5.1)
Sodium: 135 mmol/L (ref 135–145)
Total Bilirubin: 0.4 mg/dL (ref 0.3–1.2)
Total Protein: 7.8 g/dL (ref 6.5–8.1)

## 2021-10-03 LAB — TROPONIN I (HIGH SENSITIVITY)
Troponin I (High Sensitivity): 4 ng/L (ref <18)
Troponin I (High Sensitivity): 3 ng/L (ref <18)

## 2021-10-03 LAB — BRAIN NATRIURETIC PEPTIDE: B Natriuretic Peptide: 46 pg/mL (ref 0.0–100.0)

## 2021-10-03 MED ORDER — FUROSEMIDE 10 MG/ML IJ SOLN
40.0000 mg | Freq: Once | INTRAMUSCULAR | Status: AC
  Administered 2021-10-03: 40 mg via INTRAVENOUS
  Filled 2021-10-03: qty 4

## 2021-10-03 MED ORDER — IOHEXOL 350 MG/ML SOLN
100.0000 mL | Freq: Once | INTRAVENOUS | Status: AC | PRN
  Administered 2021-10-03: 100 mL via INTRAVENOUS

## 2021-10-03 NOTE — ED Provider Notes (Signed)
[facility] Provider Note   CSN: [id number] Arrival date & time: 10/03/21  1957     History  Chief Complaint  Patient presents with   Arm Pain   Shortness of Breath    Christina Dominguez is a 56 y.o. female.  HPI     56 year old female with history of anxiety, asthma, bipolar disease type I, heart failure with diastolic dysfunction, depression, chronic lower extremity edema, hypertension, admission in January for acute diastolic congestive heart failure exacerbation who presents with concern for episode of chest pain on Monday, and worsening dyspnea on exertion.  Sunday began have to have some chest discomfort, left arm, shortness of breath, fatigue Monday was exercising and felt more severe heart burn, and shortness of breath, fatigue, walking to the car was worse also Now feeling shortness of breath, fatigue Left arm and lower back currently with pain Feels like a tightness, hurts more in arm when raising it Chest pain was Monday, arm pain is with movements not ambulation, having exertional dyspnea since Sunday but not exertional chest pain Swelling of legs Reports that she was 287 pounds that she initially checked in the hospital, lost 17 pounds during her hospitalization and since it has been slowly increasing, today she was 288.    Her Lasix was increased as an outpatient from 20 to 40-60, with the increase to 60 happening 2 weeks ago.  Despite taking the increased dose, she feels she is having worsening swelling and dyspnea.  Over the last couple of days she has had more significant orthopnea, dyspnea on exertion and swelling.  Reports initially the swelling had improved following her hospitalization, but has worsened over the last couple of days.  Since leaving the hospital she has made major lifestyle changes-had family throw away bad foods, has been focusing on diet, exercising 3 times a week compared to previous sedentary life  Past  Medical History:  Diagnosis Date   Anxiety    Aortic atherosclerosis ([place]) 08/19/2021   Asthma    Bipolar 1 disorder (HCC)    CHF (congestive heart failure) (HCC)    Class 3 severe obesity with serious comorbidity and body mass index (BMI) greater than or equal to 70 in adult [facility]) 12/21/2019   Depression    Diastolic dysfunction 78/29/5621   Diverticulosis 08/19/2021   Edema of both lower extremities    Hepatic steatosis 08/19/2021   Hepatomegaly 08/19/2021   High blood pressure    Joint pain    Prediabetes 12/22/2019     Home Medications Prior to Admission medications   Medication Sig Start Date End Date Taking? Authorizing Provider  acetaminophen (TYLENOL) 650 MG CR tablet Take 1,300 mg by mouth daily.    [provider]  albuterol (VENTOLIN HFA) 108 (90 Base) MCG/ACT inhaler Inhale 1 puff into the lungs every 4 (four) hours as needed for wheezing or shortness of breath.    [provider]  atorvastatin (LIPITOR) 40 MG tablet Take 1 tablet (40 mg total) by mouth daily. Patient not taking: Reported on 09/26/2021 08/22/21   Little Ishikawa, MD  CATS CLAW, UNCARIA TOMENTOSA, PO Take 2 capsules by mouth daily. Unknown strenght    [provider]  diclofenac Sodium (VOLTAREN) 1 % GEL Apply 2 g topically daily as needed (pain).    [provider]  furosemide (LASIX) 40 MG tablet Take 1 tablet (40 mg total) by mouth daily. Patient taking differently: Take 40 mg by mouth daily. Takes one and  one-half tablet (60 mg) daily 08/09/21 11/07/21  Richardo Priest, MD  potassium chloride (KLOR-CON) 10 MEQ tablet Take 1 tablet (10 mEq total) by mouth daily. 08/09/21 11/07/21  Richardo Priest, MD  valsartan (DIOVAN) 40 MG tablet Take 1 tablet (40 mg total) by mouth daily. 08/09/21   Richardo Priest, MD      Allergies    Aspirin, Eggs or egg-derived products, and Other    Review of Systems   Review of Systems  Physical Exam Updated Vital Signs BP 120/77    Pulse 76     Temp 97.9 F (36.6 C) (Oral)    Resp (!) 21    SpO2 99%  Physical Exam Vitals and nursing note reviewed.  Constitutional:      General: She is not in acute distress.    Appearance: She is well-developed. She is not diaphoretic.  HENT:     Head: Normocephalic and atraumatic.  Eyes:     Conjunctiva/sclera: Conjunctivae normal.  Neck:     Vascular: JVD present.  Cardiovascular:     Rate and Rhythm: Normal rate and regular rhythm.     Heart sounds: Normal heart sounds. No murmur heard.   No friction rub. No gallop.  Pulmonary:     Effort: Pulmonary effort is normal. No respiratory distress.     Breath sounds: Decreased breath sounds present. No wheezing or rales.  Abdominal:     General: There is no distension.     Palpations: Abdomen is soft.     Tenderness: There is no abdominal tenderness. There is no guarding.  Musculoskeletal:        General: No tenderness.     Cervical back: Normal range of motion.     Right lower leg: Edema present.     Left lower leg: Edema present.  Skin:    General: Skin is warm and dry.     Findings: No erythema or rash.  Neurological:     Mental Status: She is alert and oriented to person, place, and time.    ED Results / Procedures / Treatments   Labs (all labs ordered are listed, but only abnormal results are displayed) Labs Reviewed  CBC WITH DIFFERENTIAL/PLATELET - Abnormal; Notable for the following components:      Result Value   WBC 10.7 (*)    Hemoglobin 11.7 (*)    All other components within normal limits  COMPREHENSIVE METABOLIC PANEL - Abnormal; Notable for the following components:   Chloride 96 (*)    Calcium 8.7 (*)    All other components within normal limits  RESP PANEL BY RT-PCR (FLU A&B, COVID) ARPGX2  BRAIN NATRIURETIC PEPTIDE  TROPONIN I (HIGH SENSITIVITY)  TROPONIN I (HIGH SENSITIVITY)    EKG EKG Interpretation  Date/Time:  Tuesday October 03 2021 20:06:00 EST Ventricular Rate:  73 PR Interval:  166 QRS  Duration: 78 QT Interval:  424 QTC Calculation: 467 R Axis:   50 Text Interpretation: Normal sinus rhythm Low voltage QRS Cannot rule out Anterior infarct , age undetermined Abnormal ECG When compared with ECG of 18-Aug-2021 12:52, No significant change since last tracing Confirmed by Gareth Morgan [phone]) on 10/03/2021 8:12:13 PM  Radiology CT Angio Chest PE W and/or Wo Contrast  Result Date: 10/03/2021 CLINICAL DATA:  Chest pain and shortness of breath. EXAM: CT ANGIOGRAPHY CHEST WITH CONTRAST TECHNIQUE: Multidetector CT imaging of the chest was performed using the standard protocol during bolus administration of intravenous contrast. Multiplanar CT image reconstructions and  MIPs were obtained to evaluate the vascular anatomy. RADIATION DOSE REDUCTION: This exam was performed according to the departmental dose-optimization program which includes automated exposure control, adjustment of the mA and/or kV according to patient size and/or use of iterative reconstruction technique. CONTRAST:  151mL OMNIPAQUE IOHEXOL 350 MG/ML SOLN COMPARISON:  Chest radiograph dated 10/03/2021. FINDINGS: Evaluation is limited due to artifact caused by body habitus. Cardiovascular: There is no cardiomegaly or pericardial effusion. The thoracic aorta is unremarkable. The origins of the great vessels of the aortic arch appear patent. Evaluation of the pulmonary arteries is very limited due to suboptimal opacification and timing of the contrast as well as secondary to artifact caused by body habitus. No large or central pulmonary artery embolus identified. There is dilatation of the main pulmonary trunk suggestive of pulmonary hypertension. Mediastinum/Nodes: No hilar or mediastinal adenopathy. The esophagus and thyroid gland are grossly unremarkable. No mediastinal fluid collection. There is a 4.5 x 3.0 cm ovoid structure in the posterior mediastinum along the spine at T4-T5 on the right. This lesion is not characterized on  this CT but may represent a neurogenic cyst, or an enteric duplication cyst arising from the esophagus. Other etiologies include a neurogenic tumor such as a schwannoma or neurofibroma. Further characterization with MRI without and with contrast on a nonemergent/outpatient basis recommended. Lungs/Pleura: The lungs are clear. There is no pleural effusion or pneumothorax. The central airways are patent. Upper Abdomen: No acute abnormality. Musculoskeletal: Degenerative changes of the spine. No acute osseous pathology. Review of the MIP images confirms the above findings. IMPRESSION: 1. No acute intrathoracic pathology. No large or central pulmonary artery embolus identified. 2. Dilatation of the main pulmonary trunk suggestive of pulmonary hypertension. 3. Ovoid posterior mediastinal/paraspinal lesion as above. Further characterization with MRI without and with contrast on a nonemergent/outpatient basis recommended. Electronically Signed   By: Anner Crete M.D.   On: 10/03/2021 23:24   DG Chest Portable 1 View  Result Date: 10/03/2021 CLINICAL DATA:  Left upper extremity pain, shortness of breath for 3 days EXAM: PORTABLE CHEST 1 VIEW COMPARISON:  08/18/2021 FINDINGS: Single frontal view of the chest was obtained, limited by portable technique and body habitus. Cardiac silhouette is stable. No airspace disease, effusion, or pneumothorax. No acute bony abnormalities. IMPRESSION: 1. Unremarkable exam limited by portable technique and body habitus. Electronically Signed   By: Randa Ngo M.D.   On: 10/03/2021 21:02    Procedures Procedures    Medications Ordered in ED Medications  furosemide (LASIX) injection 40 mg (has no administration in time range)  iohexol (OMNIPAQUE) 350 MG/ML injection 100 mL (100 mLs Intravenous Contrast Given 10/03/21 2310)    ED Course/ Medical Decision Making/ A&P                             56 year old female with history of anxiety, asthma, bipolar disease type I,  heart failure with diastolic dysfunction, depression, chronic lower extremity edema, hypertension, admission in January for acute diastolic congestive heart failure exacerbation who presents with concern for episode of chest pain on Monday, and worsening dyspnea on exertion.  Differential diagnosis for dyspnea includes ACS, PE, COPD exacerbation, CHF exacerbation, anemia, pneumonia, viral etiology such as COVID 19 infection, metabolic abnormality.    Chest x-ray was done and personally evaluated by me and showed  EKG was evaluated by me which showed .    BNP was 46, increased from prior value in January prior to  admission.  Troponin negative x2.  NO significant anemia, electrolyte abnormality.  CT PE study limited for pE, findings suggestive of pulmonary hypertension, ovoid posterior mediastinal lesion.    Clinically, she is describing significant weight gain, orthopnea, leg swelling, dyspnea on exertion, BNP likely underestimated and increased from previous admission and given prior improvement with diuresis feel this is indicated and ordered 40mg  IV.  Concern that she may need further ischemic work up given exertional chest pain she experienced on Monday.  May need further eval/treatment of suspected pulmonary hypertension. Will admit for continued observation.          Final Clinical Impression(s) / ED Diagnoses Final diagnoses:  Dyspnea on exertion  Orthopnea  Exertional chest pain  Acute on chronic diastolic congestive heart failure (HCC)  Posterior mediastinal tumor    Rx / DC Orders ED Discharge Orders     None         Gareth Morgan, MD 10/03/21 2356

## 2021-10-03 NOTE — ED Triage Notes (Addendum)
Pt c/o left UE pain, SOB x 3 days-states indigestion yesterday-none today-states she spoke to her insurance nurse and was advised to come to ED for "possible silent heart attack"-pt denies CP-NAD- to triage in w/c-pt denies fever/flu sx

## 2021-10-04 ENCOUNTER — Inpatient Hospital Stay (HOSPITAL_BASED_OUTPATIENT_CLINIC_OR_DEPARTMENT_OTHER): Payer: Commercial Managed Care - HMO

## 2021-10-04 ENCOUNTER — Other Ambulatory Visit: Payer: Self-pay

## 2021-10-04 ENCOUNTER — Encounter (HOSPITAL_COMMUNITY): Payer: Self-pay | Admitting: Internal Medicine

## 2021-10-04 DIAGNOSIS — R609 Edema, unspecified: Secondary | ICD-10-CM

## 2021-10-04 DIAGNOSIS — M7989 Other specified soft tissue disorders: Secondary | ICD-10-CM

## 2021-10-04 DIAGNOSIS — J4520 Mild intermittent asthma, uncomplicated: Secondary | ICD-10-CM | POA: Diagnosis not present

## 2021-10-04 DIAGNOSIS — I5033 Acute on chronic diastolic (congestive) heart failure: Secondary | ICD-10-CM | POA: Diagnosis not present

## 2021-10-04 DIAGNOSIS — I10 Essential (primary) hypertension: Secondary | ICD-10-CM | POA: Diagnosis not present

## 2021-10-04 DIAGNOSIS — J9859 Other diseases of mediastinum, not elsewhere classified: Secondary | ICD-10-CM | POA: Diagnosis present

## 2021-10-04 DIAGNOSIS — E785 Hyperlipidemia, unspecified: Secondary | ICD-10-CM

## 2021-10-04 DIAGNOSIS — Z6841 Body Mass Index (BMI) 40.0 and over, adult: Secondary | ICD-10-CM | POA: Diagnosis not present

## 2021-10-04 LAB — CBC
HCT: 35.1 % — ABNORMAL LOW (ref 36.0–46.0)
Hemoglobin: 11 g/dL — ABNORMAL LOW (ref 12.0–15.0)
MCH: 28.8 pg (ref 26.0–34.0)
MCHC: 31.3 g/dL (ref 30.0–36.0)
MCV: 91.9 fL (ref 80.0–100.0)
Platelets: 281 10*3/uL (ref 150–400)
RBC: 3.82 MIL/uL — ABNORMAL LOW (ref 3.87–5.11)
RDW: 14.9 % (ref 11.5–15.5)
WBC: 7.9 10*3/uL (ref 4.0–10.5)
nRBC: 0 % (ref 0.0–0.2)

## 2021-10-04 LAB — BASIC METABOLIC PANEL
Anion gap: 11 (ref 5–15)
BUN: 14 mg/dL (ref 6–20)
CO2: 31 mmol/L (ref 22–32)
Calcium: 8.7 mg/dL — ABNORMAL LOW (ref 8.9–10.3)
Chloride: 97 mmol/L — ABNORMAL LOW (ref 98–111)
Creatinine, Ser: 0.94 mg/dL (ref 0.44–1.00)
GFR, Estimated: >60 mL/min (ref >60)
Glucose, Bld: 96 mg/dL (ref 70–99)
Potassium: 3.6 mmol/L (ref 3.5–5.1)
Sodium: 139 mmol/L (ref 135–145)

## 2021-10-04 LAB — RESP PANEL BY RT-PCR (FLU A&B, COVID) ARPGX2
Influenza A by PCR: NEGATIVE
Influenza B by PCR: NEGATIVE
SARS Coronavirus 2 by RT PCR: NEGATIVE

## 2021-10-04 LAB — MRSA NEXT GEN BY PCR, NASAL: MRSA by PCR Next Gen: NOT DETECTED

## 2021-10-04 MED ORDER — FUROSEMIDE 10 MG/ML IJ SOLN
40.0000 mg | Freq: Two times a day (BID) | INTRAMUSCULAR | Status: DC
  Administered 2021-10-04 – 2021-10-08 (×9): 40 mg via INTRAVENOUS
  Filled 2021-10-04 (×8): qty 4

## 2021-10-04 MED ORDER — MAGNESIUM HYDROXIDE 400 MG/5ML PO SUSP
30.0000 mL | Freq: Every day | ORAL | Status: DC | PRN
  Filled 2021-10-04 (×2): qty 30

## 2021-10-04 MED ORDER — POTASSIUM CHLORIDE CRYS ER 10 MEQ PO TBCR
10.0000 meq | EXTENDED_RELEASE_TABLET | Freq: Every day | ORAL | Status: DC
  Administered 2021-10-04 – 2021-10-05 (×2): 10 meq via ORAL
  Filled 2021-10-04 (×2): qty 1

## 2021-10-04 MED ORDER — ACETAMINOPHEN 650 MG RE SUPP: 650.0000 mg | Freq: Four times a day (QID) | RECTAL | Status: DC | PRN

## 2021-10-04 MED ORDER — ONDANSETRON HCL 4 MG PO TABS: 4.0000 mg | ORAL_TABLET | Freq: Four times a day (QID) | ORAL | Status: DC | PRN

## 2021-10-04 MED ORDER — ONDANSETRON HCL 4 MG/2ML IJ SOLN
4.0000 mg | Freq: Four times a day (QID) | INTRAMUSCULAR | Status: DC | PRN
  Administered 2021-10-06: 4 mg via INTRAVENOUS
  Filled 2021-10-04: qty 2

## 2021-10-04 MED ORDER — HEPARIN SODIUM (PORCINE) 5000 UNIT/ML IJ SOLN
5000.0000 [IU] | Freq: Three times a day (TID) | INTRAMUSCULAR | Status: DC
  Administered 2021-10-04 – 2021-10-10 (×19): 5000 [IU] via SUBCUTANEOUS
  Filled 2021-10-04 (×20): qty 1

## 2021-10-04 MED ORDER — TRAZODONE HCL 50 MG PO TABS
25.0000 mg | ORAL_TABLET | Freq: Every evening | ORAL | Status: DC | PRN
  Administered 2021-10-09 – 2021-10-10 (×2): 25 mg via ORAL
  Filled 2021-10-04 (×2): qty 1

## 2021-10-04 MED ORDER — ALBUTEROL SULFATE HFA 108 (90 BASE) MCG/ACT IN AERS: 1.0000 | INHALATION_SPRAY | RESPIRATORY_TRACT | Status: DC | PRN

## 2021-10-04 MED ORDER — ALBUTEROL SULFATE (2.5 MG/3ML) 0.083% IN NEBU: 2.5000 mg | INHALATION_SOLUTION | RESPIRATORY_TRACT | Status: DC | PRN

## 2021-10-04 MED ORDER — IRBESARTAN 75 MG PO TABS
37.5000 mg | ORAL_TABLET | Freq: Every day | ORAL | Status: DC
  Administered 2021-10-04 – 2021-10-08 (×5): 37.5 mg via ORAL
  Filled 2021-10-04 (×5): qty 0.5

## 2021-10-04 MED ORDER — SPIRONOLACTONE 12.5 MG HALF TABLET
12.5000 mg | ORAL_TABLET | Freq: Every day | ORAL | Status: DC
  Administered 2021-10-04 – 2021-10-08 (×5): 12.5 mg via ORAL
  Filled 2021-10-04 (×5): qty 1

## 2021-10-04 MED ORDER — ACETAMINOPHEN 325 MG PO TABS: 650.0000 mg | ORAL_TABLET | Freq: Four times a day (QID) | ORAL | Status: DC | PRN

## 2021-10-04 NOTE — Progress Notes (Addendum)
Patient seen and examined, admitted earlier this morning by Dr. Sidney Ace, please see his H&P for details, briefly this is a 55/female with morbid obesity, BMI of 73, chronic diastolic CHF, asthma, depression presented to the ED with worsening dyspnea for the past 3 to 4 days, recently discharged over a month ago after hospitalization for same. ?-In the ED blood pressure was 150/85, BNP 46, WBC 10.7, two-view chest x-ray was unremarkable, limited by body habitus, CT chest noted dilation of pulmonary trunk suggestive of pulmonary hypertension and posterior mediastinal/paraspinal lesion ? ? ?Acute on chronic diastolic CHF ?Patient reported 18 pound weight gain following recent discharge, on chart review here 7lbs noted ?-Has been taking Lasix 40 Mg daily at baseline ?-Recent echo 1/23 with preserved EF, grade 1 diastolic dysfunction, normal RV function ?-Volume status extremely difficult to assess with morbid obesity, does not appear overtly fluid overloaded today, may need CVP assessment, or ideally RHC ?-Continue Lasix 40 Mg twice daily, add Aldactone ?-Will request cardiology input ?-Monitor I's/O, daily weights ? ?Abnormal imaging ?-Noted on CT chest in posterior mediastinum/paraspinal area, MRI ordered by admitting MD will follow up ? ?Essential hypertension ?-Continue ARB ? ?OSA ?-Due for sleep study in March ? ?Morbid obesity ?-BMI 73 ?-Dietitian consult, needs aggressive lifestyle modification ? ?H/o Asthma ?-Stable ? ? ?Domenic Polite, MD ?

## 2021-10-04 NOTE — Assessment & Plan Note (Addendum)
Pulmonary hypertension with acute on chronic core pulmonale.  ?Patient was admitted to the cardiac ward, placed on aggressive diuresis, negative fluid balance was achieved, -7,235 ml since admission with improvement of her symptoms.  ? ?Echocardiogram from 1/23 with preserved LV EF 55 to 07%, with RV systolic function is preserved. Trivial pericardial effusion.  ? ?Further work up with right and left heart catheterization showed preserved LV systolic function, no aortic stenosis, no coronary artery disease. ?PA 53/30, with mean 43.  ?PVWP 24  ?CO 7.35 with CI 3.0 ? ?Patient will be discharge home with increase dose of furosemide and continue with valsartan. ?Possible addition of B blocker as outpatient if blood pressure allows.  ?

## 2021-10-04 NOTE — Plan of Care (Signed)

## 2021-10-04 NOTE — Progress Notes (Signed)
Pt arrived to 2c03 via [facility].  Pt alert and oriented.  Placed in bed, monitor on, vss. Pt oriented to room and unit.  Paged [facility] to inform pt has arrived.  Waiting orders. ?

## 2021-10-04 NOTE — Plan of Care (Signed)
  Problem: Education: Goal: Ability to verbalize understanding of medication therapies will improve Outcome: Progressing   Problem: Cardiac: Goal: Ability to achieve and maintain adequate cardiopulmonary perfusion will improve Outcome: Progressing   

## 2021-10-04 NOTE — Progress Notes (Signed)
Pt made aware that when a CPAP machine becomes available that we will set it up for her use. ?

## 2021-10-04 NOTE — Assessment & Plan Note (Addendum)
Abnormal imaging ?-Abnormal finding on CT chest,  ?MRI suggests this is a benign pleural cyst, neurogenic cyst, or esophageal foregut duplication cyst, no further work-up indicated ?

## 2021-10-04 NOTE — Consult Note (Signed)
Cardiology Consultation:   Christina Dominguez ID: ROSELY FERNANDEZ MRN: [id number]; DOB: Feb 17, 1966  Admit date: 10/03/2021 Date of Consult: 10/04/2021  PCP:  Fredirick Lathe, PA-C   [facility] Cardiologist:  Dr. Bettina Gavia  Christina Dominguez Profile:   Christina Dominguez is a 56 y.o. female with a history of chronic diastolic CHF with chronic lower extremity edema, hypertension, prediabetes, mild asthma, anxiety/depression, bipolar disorder and morbid obesity who is being seen today for the evaluation of CHF at the request of Dr. Broadus John.  History of Present Illness:   Christina Dominguez is a 56 year old female with the above history who is followed by Dr. Bettina Gavia  Christina Dominguez was recently referred to Cardiology in 08/2021 after recent ED visit for decompensated CHF.  At that visit, she reported longstanding lower extremity edema and shortness of breath.  Symptoms initially improved after being given a 1 week prescription of Lasix following ED visit in 06/2021 but after completing this she noticed increased edema, shortness of breath, orthopnea, and PND.  BNP was normal. Triameterene-HCTZ was stopped and she was started on Lasix 40 mg daily.  Echo was ordered.  Before Echo could be done, Christina Dominguez was admitted with acute hypoxic respiratory failure secondary to acute on chronic diastolic CHF.  Echo during admission showed LVEF of 55-60% with normal wall motion, mild LVH, and grade 1 diastolic dysfunction.  She was diuresed with IV Lasix and then transition back to oral Lasix.  She was recently seen by Dr. Agustin Cree on 09/26/2021 at which time she had gained a few pounds by like she was retaining more fluid. Volume status was difficult to assess due to obesity.  Pro-BNP was normal and no medication changes were made.  Christina Dominguez presented to the ED on 10/03/2021 for further evaluation of shortness of breath and left upper extremity pain. On arrival to the ED, Christina Dominguez hypertensive at times but vitals stable.  EKG showed  normal sinus rhythm with no acute ischemic changes. High sensitivity troponin negative at 4 >> 3.  BNP normal.  X-ray was limited by body habitus but no findings noted.  Chest CTA negative for PE but did showed dilatation of the main pulmonary trunk suggestive of pulmonary hypertension. WBC 10.7, Hgb 11.7, Plts 352. Na 135, K 3.8, Glucose 92, BUN 19, Cr .95. LFTs normal.  Christina Dominguez was started on IV Lasix and admitted for acute on chronic diastolic CHF.  At the time of evaluation, Christina Dominguez resting comfortably no acute distress.  She states she has made significant lifestyle changes since her last hospitalization.  She has completely changed her diet and is working with a nutritionist.  She is also started going to the gym.  She goes to the gym 3 times a week and will exercise in the pool for 50 minutes.  She reports worsening lower extremity edema over the last week.  Then on Sunday, 10/01/2021, Christina Dominguez started having left arm pain with associated weakness and shortness of breath.  Symptoms lasted basically all day.  When she went to the gym the next day, she also noted "heartburn" while working out in the pool.  Symptoms resolved with rest.  She has chronic shortness of breath with higher levels of exertion but usually does not have any shortness of breath with routine daily activities.  However, she has had worsening shortness of breath lately with routine activities.  She has chronic orthopnea and states she has slept on "more pillows than I can count" for years.  No PND.  Christina Dominguez states her  weight has been fluctuating since last discharge and ranges from 170 to 189 lbs.  Weight was 177 lbs on admission.  He has presumed sleep apnea and is scheduled to have a sleep study soon.  She denies any recent fevers or illnesses. No GI symptoms. No abnormal bleeding in urine or stools.  Denies any history of tobacco use.  She does have a family history of heart disease.  She states her mother recently died in 06-01-21 from  "broken heart syndrome."  Her sister also died from a heart attack at a young age.  Past Medical History:  Diagnosis Date   Anxiety    Aortic atherosclerosis ([place]) 08/19/2021   Asthma    Bipolar 1 disorder (HCC)    CHF (congestive heart failure) (HCC)    Class 3 severe obesity with serious comorbidity and body mass index (BMI) greater than or equal to 70 in adult [facility]) 12/21/2019   Depression    Diastolic dysfunction 04/88/8916   Diverticulosis 08/19/2021   Edema of both lower extremities    Hepatic steatosis 08/19/2021   Hepatomegaly 08/19/2021   High blood pressure    Joint pain    Prediabetes 12/22/2019    Past Surgical History:  Procedure Laterality Date   NO PAST SURGERIES       Home Medications:  Prior to Admission medications   Medication Sig Start Date End Date Taking? Authorizing Provider  acetaminophen (TYLENOL) 650 MG CR tablet Take 1,300 mg by mouth daily.    [provider]  albuterol (VENTOLIN HFA) 108 (90 Base) MCG/ACT inhaler Inhale 1 puff into the lungs every 4 (four) hours as needed for wheezing or shortness of breath.    [provider]  atorvastatin (LIPITOR) 40 MG tablet Take 1 tablet (40 mg total) by mouth daily. Christina Dominguez not taking: Reported on 09/26/2021 08/22/21   Little Ishikawa, MD  CATS CLAW, UNCARIA TOMENTOSA, PO Take 2 capsules by mouth daily. Unknown strenght    [provider]  diclofenac Sodium (VOLTAREN) 1 % GEL Apply 2 g topically daily as needed (pain).    [provider]  furosemide (LASIX) 40 MG tablet Take 1 tablet (40 mg total) by mouth daily. Christina Dominguez taking differently: Take 40 mg by mouth daily. Takes one and one-half tablet (60 mg) daily 08/09/21 11/07/21  Richardo Priest, MD  potassium chloride (KLOR-CON) 10 MEQ tablet Take 1 tablet (10 mEq total) by mouth daily. 08/09/21 11/07/21  Richardo Priest, MD  valsartan (DIOVAN) 40 MG tablet Take 1 tablet (40 mg total) by mouth daily. 08/09/21   Richardo Priest, MD     Inpatient Medications: Scheduled Meds:  furosemide  40 mg Intravenous Q12H   heparin  5,000 Units Subcutaneous Q8H   irbesartan  37.5 mg Oral Daily   potassium chloride  10 mEq Oral Daily   spironolactone  12.5 mg Oral Daily   Continuous Infusions:  PRN Meds: acetaminophen **OR** acetaminophen, albuterol, magnesium hydroxide, ondansetron **OR** ondansetron (ZOFRAN) IV, traZODone  Allergies:    Allergies  Allergen Reactions   Aspirin    Eggs Or Egg-Derived Products Swelling   Other Rash    Pt states she is allergic to eggs and flu vaccine - swelling    Social History:   Social History   Socioeconomic History   Marital status: Single    Spouse name: Not on file   Number of children: Not on file   Years of education: Not on file   Highest education level:  Not on file  Occupational History   Occupation: Medical illustrator, work from home  Tobacco Use   Smoking status: Never    Passive exposure: Never   Smokeless tobacco: Never  Vaping Use   Vaping Use: Never used  Substance and Sexual Activity   Alcohol use: Yes    Alcohol/week: 2.0 standard drinks    Types: 1 Glasses of wine, 1 Standard drinks or equivalent per week    Comment: occ   Drug use: Never   Sexual activity: Not on file  Other Topics Concern   Not on file  Social History Narrative   Not on file   Social Determinants of Health   Financial Resource Strain: Not on file  Food Insecurity: Not on file  Transportation Needs: Not on file  Physical Activity: Not on file  Stress: Not on file  Social Connections: Not on file  Intimate Partner Violence: Not on file    Family History:   Family History  Problem Relation Age of Onset   Heart Problems Mother        Broken heart syndrome   Hypertension Mother    Hypercholesterolemia Mother    Bone cancer Father    Testicular cancer Father    Heart attack Sister    Diabetes Brother    Colon cancer Maternal Grandfather    Dementia Paternal Grandmother       ROS:  Please see the history of present illness.  Review of Systems  Constitutional:  Negative for chills and fever.  HENT:  Negative for congestion.   Respiratory:  Positive for shortness of breath.   Cardiovascular:  Positive for chest pain, orthopnea and leg swelling. Negative for PND.  Gastrointestinal:  Negative for blood in stool, melena, nausea and vomiting.  Genitourinary:  Negative for hematuria.  Musculoskeletal:  Negative for myalgias.  Neurological:  Positive for weakness.  Endo/Heme/Allergies:  Does not bruise/bleed easily.  Psychiatric/Behavioral:  Negative for substance abuse.       Physical Exam/Data:   Vitals:   10/04/21 0244 10/04/21 0445 10/04/21 0729 10/04/21 1128  BP: 137/79  (!) 146/78 124/81  Pulse: 72  70 69  Resp: (!) 22 20 18 18   Temp: 97.7 F (36.5 C)  97.9 F (36.6 C) 98.2 F (36.8 C)  TempSrc: Oral  Axillary Oral  SpO2:   99% 93%  Weight: (!) 171.3 kg     Height: 5' (1.524 m)       Intake/Output Summary (Last 24 hours) at 10/04/2021 1348 Last data filed at 10/04/2021 1132 Gross per 24 hour  Intake 480 ml  Output 2600 ml  Net -2120 ml   Last 3 Weights 10/04/2021 09/26/2021 08/30/2021  Weight (lbs) 377 lb 10.4 oz 383 lb 378 lb 6.4 oz  Weight (kg) 171.3 kg 173.728 kg 171.641 kg     Body mass index is 73.75 kg/m.  General: 56 y.o. morbidly obese African-American female resting comfortably in no acute distress. HEENT: Normocephalic and atraumatic. Sclera clear. EOMs intact. Neck: Supple. No JVD. Heart: RRR. Distinct S1 and S2. No murmurs, gallops, or rubs.  Lungs: No increased work of breathing. Clear to ausculation bilaterally. No wheezes, rhonchi, or rales.  Abdomen: Soft, non-distended, and non-tender to palpation. Bowel sounds present. Extremities: 2+ lower extremity edema (left > right). Skin: Warm and dry. Neuro: Alert and oriented x3. No focal deficits. Psych: Normal affect. Responds appropriately.  EKG:  The EKG was personally  reviewed and demonstrates: Normal sinus rhythm, rate 73 bpm, with nonspecific  ST/T changes.  Normal axis.  Normal PR and QRS intervals.  QTc 467 ms.  Telemetry:  Telemetry was personally reviewed and demonstrates:  Normal sinus rhythm with rates in the 70s.  Relevant CV Studies:  Echocardiogram 08/20/2021: Impressions:  1. Left ventricular ejection fraction, by estimation, is 55 to 60%. The  left ventricle has normal function. The left ventricle has no regional  wall motion abnormalities. There is mild concentric left ventricular  hypertrophy. Left ventricular diastolic  parameters are consistent with Grade I diastolic dysfunction (impaired  relaxation).   2. Right ventricular systolic function is normal. The right ventricular  size is normal. Tricuspid regurgitation signal is inadequate for assessing  PA pressure.   3. The mitral valve is normal in structure. No evidence of mitral valve  regurgitation. No evidence of mitral stenosis.   4. The aortic valve was not well visualized. Aortic valve regurgitation  is not visualized. No aortic stenosis is present.   5. The inferior vena cava is normal in size with <50% respiratory  variability, suggesting right atrial pressure of 8 mmHg.   Comparison(s): No prior Echocardiogram.  Laboratory Data:  High Sensitivity Troponin:   Recent Labs  Lab 10/03/21 2120 10/03/21 2326  TROPONINIHS 4 3     Chemistry Recent Labs  Lab 10/03/21 2120 10/04/21 0618  NA 135 139  K 3.8 3.6  CL 96* 97*  CO2 30 31  GLUCOSE 92 96  BUN 19 14  CREATININE 0.95 0.94  CALCIUM 8.7* 8.7*  GFRNONAA >60 >60  ANIONGAP 9 11    Recent Labs  Lab 10/03/21 2120  PROT 7.8  ALBUMIN 3.8  AST 20  ALT 19  ALKPHOS 117  BILITOT 0.4   Lipids No results for input(s): CHOL, TRIG, HDL, LABVLDL, LDLCALC, CHOLHDL in the last 168 hours.  Hematology Recent Labs  Lab 10/03/21 2120 10/04/21 0618  WBC 10.7* 7.9  RBC 3.94 3.82*  HGB 11.7* 11.0*  HCT 36.4 35.1*   MCV 92.4 91.9  MCH 29.7 28.8  MCHC 32.1 31.3  RDW 15.0 14.9  PLT 352 281   Thyroid No results for input(s): TSH, FREET4 in the last 168 hours.  BNP Recent Labs  Lab 10/03/21 2120  BNP 46.0    DDimer No results for input(s): DDIMER in the last 168 hours.   Radiology/Studies:  CT Angio Chest PE W and/or Wo Contrast  Result Date: 10/03/2021 CLINICAL DATA:  Chest pain and shortness of breath. EXAM: CT ANGIOGRAPHY CHEST WITH CONTRAST TECHNIQUE: Multidetector CT imaging of the chest was performed using the standard protocol during bolus administration of intravenous contrast. Multiplanar CT image reconstructions and MIPs were obtained to evaluate the vascular anatomy. RADIATION DOSE REDUCTION: This exam was performed according to the departmental dose-optimization program which includes automated exposure control, adjustment of the mA and/or kV according to Christina Dominguez size and/or use of iterative reconstruction technique. CONTRAST:  165mL OMNIPAQUE IOHEXOL 350 MG/ML SOLN COMPARISON:  Chest radiograph dated 10/03/2021. FINDINGS: Evaluation is limited due to artifact caused by body habitus. Cardiovascular: There is no cardiomegaly or pericardial effusion. The thoracic aorta is unremarkable. The origins of the great vessels of the aortic arch appear patent. Evaluation of the pulmonary arteries is very limited due to suboptimal opacification and timing of the contrast as well as secondary to artifact caused by body habitus. No large or central pulmonary artery embolus identified. There is dilatation of the main pulmonary trunk suggestive of pulmonary hypertension. Mediastinum/Nodes: No hilar or mediastinal adenopathy. The esophagus and  thyroid gland are grossly unremarkable. No mediastinal fluid collection. There is a 4.5 x 3.0 cm ovoid structure in the posterior mediastinum along the spine at T4-T5 on the right. This lesion is not characterized on this CT but may represent a neurogenic cyst, or an enteric  duplication cyst arising from the esophagus. Other etiologies include a neurogenic tumor such as a schwannoma or neurofibroma. Further characterization with MRI without and with contrast on a nonemergent/outpatient basis recommended. Lungs/Pleura: The lungs are clear. There is no pleural effusion or pneumothorax. The central airways are patent. Upper Abdomen: No acute abnormality. Musculoskeletal: Degenerative changes of the spine. No acute osseous pathology. Review of the MIP images confirms the above findings. IMPRESSION: 1. No acute intrathoracic pathology. No large or central pulmonary artery embolus identified. 2. Dilatation of the main pulmonary trunk suggestive of pulmonary hypertension. 3. Ovoid posterior mediastinal/paraspinal lesion as above. Further characterization with MRI without and with contrast on a nonemergent/outpatient basis recommended. Electronically Signed   By: Anner Crete M.D.   On: 10/03/2021 23:24   DG Chest Portable 1 View  Result Date: 10/03/2021 CLINICAL DATA:  Left upper extremity pain, shortness of breath for 3 days EXAM: PORTABLE CHEST 1 VIEW COMPARISON:  08/18/2021 FINDINGS: Single frontal view of the chest was obtained, limited by portable technique and body habitus. Cardiac silhouette is stable. No airspace disease, effusion, or pneumothorax. No acute bony abnormalities. IMPRESSION: 1. Unremarkable exam limited by portable technique and body habitus. Electronically Signed   By: Randa Ngo M.D.   On: 10/03/2021 21:02     Assessment and Plan:   Acute on Chronic Diastolic CHF Christina Dominguez presented with chest pain, worsening shortness of breath, worsening lower extremity edema.  High-sensitivity troponin negative x2.  BNP normal but possibly falsely low due to morbid obesity.  Chest x-ray was limited by body habitus but no acute edema noted.  Chest CTA negative for PE did show dilatation of the pulmonary artery suggestive of pulmonary hypertension.  Recent Echo in  08/2021 showed a 55-60% with normal wall motion, mild LVH, and grade 1 diastolic dysfunction.  RV normal.  Started on  IV Lasix with good urinary response.  Net negative 2.1 L since admission. Renal function stable.  - Volume status is very difficult to assess due to body habitus. - Continue IV Lasix 40 mg twice daily.  She may benefit from Torsemide at discharge. - Agree with addition of Spironolactone 12.5mg  daily. - Continue to monitor daily weights, strict I/Os, and renal function. - Christina Dominguez is chronic lower extremity edema. Left leg > right. Will order lower extremity ultrasound to rule out DVT. If negative, can order unna boots. Christina Dominguez states this was helpful during last admission. - She may benefit from a right heart catheterization at some point if we continue to have problems accurately assessing her volume status.  Chest Pain Christina Dominguez had left arm pain and then "heartburn" during exertion earlier this week.  High-sensitivity troponin negative at 4 >>3.  EKG showed no acute ischemic changes. - Currently chest pain free.  -Troponin and EKG very reassuring.  Do not think symptoms are ACS.  Possibly due to volume overload.  However, Christina Dominguez does have a family history of CAD and sister died from a heart attack at a young age.  If Christina Dominguez requires a right heart catheterization, would do a left heart cath as well to assess coronary arteries.  Hypertension BP elevated at times. - Continue Irbesartan 37.5 mg daily (on Valsartan at home). - Agree  with addition of Spironolactone as above.  Dyslipidemia - Continue Lipitor 40mg  daily.  Presumed Obstructive Sleep Apnea Christina Dominguez was noted to have apneic episodes during recent hospitalization. She has upcoming sleep study planned. - Continue CPAP here.  Otherwise, per primary team   Risk Assessment/Risk Scores:   HEAR Score (for undifferentiated chest pain):  HEAR Score: 4{   New York Heart Association (NYHA) Functional Class NYHA Class  III   For questions or updates, please contact [facility] Please consult [email or web address] for contact info under    Signed, Darreld Mclean, PA-C  10/04/2021 1:48 PM

## 2021-10-04 NOTE — Assessment & Plan Note (Addendum)
No signs of acute exacerbation, continue albuterol MDI ?

## 2021-10-04 NOTE — Assessment & Plan Note (Addendum)
BMI 73 ?Has recently seen a bariatric surgeon at [facility], supposed to lose 40 pounds prior to gastric sleeve. ?Plan for close follow up as outpatient.  ?

## 2021-10-04 NOTE — Plan of Care (Signed)
  Problem: Education: Goal: Ability to demonstrate management of disease process will improve Outcome: Progressing Goal: Ability to verbalize understanding of medication therapies will improve Outcome: Progressing   Problem: Activity: Goal: Capacity to carry out activities will improve Outcome: Progressing   

## 2021-10-04 NOTE — Assessment & Plan Note (Addendum)
Patient will need sleep study as outpatient. ?Before her discharge will check oxymetry on room air on ambulation.  ?

## 2021-10-04 NOTE — Progress Notes (Signed)
Lower extremity venous has been completed.  ? ?Preliminary results in CV Proc.  ? ?Christina Dominguez ?10/04/2021 3:18 PM    ?

## 2021-10-04 NOTE — H&P (Addendum)
[place]   PATIENT NAME: Christina Dominguez    MR#:  [id number]  DATE OF BIRTH:  1966-01-08  DATE OF ADMISSION:  10/03/2021  PRIMARY CARE PHYSICIAN: Allwardt, Randa Evens, PA-C   Patient is coming from: Home  REQUESTING/REFERRING PHYSICIAN: [facility] P:Schlossman, Junie Panning, MD   CHIEF COMPLAINT:   Chief Complaint  Patient presents with   Arm Pain   Shortness of Breath    HISTORY OF PRESENT ILLNESS:  Christina Dominguez is a 56 y.o. African-American female with medical history significant for severe obesity, diastolic CHF, asthma, bipolar 1 disorder, anxiety and depression, who presented to the ER with acute onset of worsening dyspnea over the last couple weeks with associated orthopnea and paroxysmal nocturnal dyspnea, worsening lower extremity edema.  She admitted to paroxysmal nocturnal dyspnea as well as dry cough and occasional wheezing.  No fever or chills.  She has gained 18 pounds over the last 6 weeks.  She was recently admitted in January at [facility] for acute on chronic diastolic CHF and adequately diuresed losing 17 pounds.  ED Course: When she came to the ER BP was 150/85 with otherwise normal vital signs.  Labs revealed unremarkable CMP.  High-sensitivity troponin I was 4 and later 3.  BNP was only 46.  CBC showed mild leukocytosis of 10.7.  Influenza antigens and COVID-19 PCR came back negative.  Nasal MRSA screen was negative. EKG as reviewed by me : EKG showed normal sinus rhythm with rate of 73 with low voltage QRS and Q waves inferiorly Imaging: 2 view chest x-ray showed unremarkable exam that is limited by portable technique and body habitus given her severe obesity.  Chest CTA revealed no acute intrathoracic pathology and no large or central pulmonary artery embolus identified.  It showed dilatation of the main pulmonary trunk suggestive of pulmonary hypertension and an ovoid posterior mediastinal/paraspinal lesion with recommendation for further characterization with  MRI with and without contrast.  The patient was given 40 mg of IV Lasix.  She he is directly admitted to a cardiac telemetry bed for further evaluation and management. PAST MEDICAL HISTORY:   Past Medical History:  Diagnosis Date   Anxiety    Aortic atherosclerosis ([place]) 08/19/2021   Asthma    Bipolar 1 disorder (HCC)    CHF (congestive heart failure) (HCC)    Class 3 severe obesity with serious comorbidity and body mass index (BMI) greater than or equal to 70 in adult [facility]) 12/21/2019   Depression    Diastolic dysfunction 02/03/1600   Diverticulosis 08/19/2021   Edema of both lower extremities    Hepatic steatosis 08/19/2021   Hepatomegaly 08/19/2021   High blood pressure    Joint pain    Prediabetes 12/22/2019    PAST SURGICAL HISTORY:   Past Surgical History:  Procedure Laterality Date   NO PAST SURGERIES      SOCIAL HISTORY:   Social History   Tobacco Use   Smoking status: Never    Passive exposure: Never   Smokeless tobacco: Never  Substance Use Topics   Alcohol use: Yes    Alcohol/week: 2.0 standard drinks    Types: 1 Glasses of wine, 1 Standard drinks or equivalent per week    Comment: occ    FAMILY HISTORY:   Family History  Problem Relation Age of Onset   Heart Problems Mother        Broken heart syndrome   Hypertension Mother    Hypercholesterolemia Mother  Bone cancer Father    Testicular cancer Father    Heart attack Sister    Diabetes Brother    Colon cancer Maternal Grandfather    Dementia Paternal Grandmother     DRUG ALLERGIES:   Allergies  Allergen Reactions   Aspirin    Eggs Or Egg-Derived Products Swelling   Other Rash    Pt states she is allergic to eggs and flu vaccine - swelling    REVIEW OF SYSTEMS:   ROS As per history of present illness. All pertinent systems were reviewed above. Constitutional, HEENT, cardiovascular, respiratory, GI, GU, musculoskeletal, neuro, psychiatric, endocrine,  integumentary and hematologic systems were reviewed and are otherwise negative/unremarkable except for positive findings mentioned above in the HPI.   MEDICATIONS AT HOME:   Prior to Admission medications   Medication Sig Start Date End Date Taking? Authorizing Provider  acetaminophen (TYLENOL) 650 MG CR tablet Take 1,300 mg by mouth daily.    [provider]  albuterol (VENTOLIN HFA) 108 (90 Base) MCG/ACT inhaler Inhale 1 puff into the lungs every 4 (four) hours as needed for wheezing or shortness of breath.    [provider]  atorvastatin (LIPITOR) 40 MG tablet Take 1 tablet (40 mg total) by mouth daily. Patient not taking: Reported on 09/26/2021 08/22/21   Little Ishikawa, MD  CATS CLAW, UNCARIA TOMENTOSA, PO Take 2 capsules by mouth daily. Unknown strenght    [provider]  diclofenac Sodium (VOLTAREN) 1 % GEL Apply 2 g topically daily as needed (pain).    [provider]  furosemide (LASIX) 40 MG tablet Take 1 tablet (40 mg total) by mouth daily. Patient taking differently: Take 40 mg by mouth daily. Takes one and one-half tablet (60 mg) daily 08/09/21 11/07/21  Richardo Priest, MD  potassium chloride (KLOR-CON) 10 MEQ tablet Take 1 tablet (10 mEq total) by mouth daily. 08/09/21 11/07/21  Richardo Priest, MD  valsartan (DIOVAN) 40 MG tablet Take 1 tablet (40 mg total) by mouth daily. 08/09/21   Richardo Priest, MD      VITAL SIGNS:  Blood pressure 137/79, pulse 72, temperature 97.7 F (36.5 C), temperature source Oral, resp. rate 20, height 5' (1.524 m), weight (!) 171.3 kg, SpO2 96 %.  PHYSICAL EXAMINATION:  Physical Exam  GENERAL:  56 y.o.-year-old morbidly obese African-American patient lying in the bed with no acute distress.  EYES: Pupils equal, round, reactive to light and accommodation. No scleral icterus. Extraocular muscles intact.  HEENT: Head atraumatic, normocephalic. Oropharynx and nasopharynx clear.  NECK:  Supple, no jugular venous  distention. No thyroid enlargement, no tenderness.  LUNGS: Slightly diminished bibasilar breath sounds.  No use of accessory muscles of respiration.  CARDIOVASCULAR: Regular rate and rhythm, S1, S2 normal. No murmurs, rubs, or gallops.  ABDOMEN: Soft, nondistended, nontender. Bowel sounds present. No organomegaly or mass.  EXTREMITIES: 1-2+ bilateral lower extremity pitting edema with no cyanosis, or clubbing.  NEUROLOGIC: Cranial nerves II through XII are intact. Muscle strength 5/5 in all extremities. Sensation intact. Gait not checked.  PSYCHIATRIC: The patient is alert and oriented x 3.  Normal affect and good eye contact. SKIN: No obvious rash, lesion, or ulcer.   LABORATORY PANEL:   CBC Recent Labs  Lab 10/03/21 2120  WBC 10.7*  HGB 11.7*  HCT 36.4  PLT 352   ------------------------------------------------------------------------------------------------------------------  Chemistries  Recent Labs  Lab 10/03/21 2120  NA 135  K 3.8  CL 96*  CO2 30  GLUCOSE 92  BUN 19  CREATININE 0.95  CALCIUM 8.7*  AST 20  ALT 19  ALKPHOS 117  BILITOT 0.4   ------------------------------------------------------------------------------------------------------------------  Cardiac Enzymes No results for input(s): TROPONINI in the last 168 hours. ------------------------------------------------------------------------------------------------------------------  RADIOLOGY:  CT Angio Chest PE W and/or Wo Contrast  Result Date: 10/03/2021 CLINICAL DATA:  Chest pain and shortness of breath. EXAM: CT ANGIOGRAPHY CHEST WITH CONTRAST TECHNIQUE: Multidetector CT imaging of the chest was performed using the standard protocol during bolus administration of intravenous contrast. Multiplanar CT image reconstructions and MIPs were obtained to evaluate the vascular anatomy. RADIATION DOSE REDUCTION: This exam was performed according to the departmental dose-optimization program which includes  automated exposure control, adjustment of the mA and/or kV according to patient size and/or use of iterative reconstruction technique. CONTRAST:  164mL OMNIPAQUE IOHEXOL 350 MG/ML SOLN COMPARISON:  Chest radiograph dated 10/03/2021. FINDINGS: Evaluation is limited due to artifact caused by body habitus. Cardiovascular: There is no cardiomegaly or pericardial effusion. The thoracic aorta is unremarkable. The origins of the great vessels of the aortic arch appear patent. Evaluation of the pulmonary arteries is very limited due to suboptimal opacification and timing of the contrast as well as secondary to artifact caused by body habitus. No large or central pulmonary artery embolus identified. There is dilatation of the main pulmonary trunk suggestive of pulmonary hypertension. Mediastinum/Nodes: No hilar or mediastinal adenopathy. The esophagus and thyroid gland are grossly unremarkable. No mediastinal fluid collection. There is a 4.5 x 3.0 cm ovoid structure in the posterior mediastinum along the spine at T4-T5 on the right. This lesion is not characterized on this CT but may represent a neurogenic cyst, or an enteric duplication cyst arising from the esophagus. Other etiologies include a neurogenic tumor such as a schwannoma or neurofibroma. Further characterization with MRI without and with contrast on a nonemergent/outpatient basis recommended. Lungs/Pleura: The lungs are clear. There is no pleural effusion or pneumothorax. The central airways are patent. Upper Abdomen: No acute abnormality. Musculoskeletal: Degenerative changes of the spine. No acute osseous pathology. Review of the MIP images confirms the above findings. IMPRESSION: 1. No acute intrathoracic pathology. No large or central pulmonary artery embolus identified. 2. Dilatation of the main pulmonary trunk suggestive of pulmonary hypertension. 3. Ovoid posterior mediastinal/paraspinal lesion as above. Further characterization with MRI without and with  contrast on a nonemergent/outpatient basis recommended. Electronically Signed   By: Anner Crete M.D.   On: 10/03/2021 23:24   DG Chest Portable 1 View  Result Date: 10/03/2021 CLINICAL DATA:  Left upper extremity pain, shortness of breath for 3 days EXAM: PORTABLE CHEST 1 VIEW COMPARISON:  08/18/2021 FINDINGS: Single frontal view of the chest was obtained, limited by portable technique and body habitus. Cardiac silhouette is stable. No airspace disease, effusion, or pneumothorax. No acute bony abnormalities. IMPRESSION: 1. Unremarkable exam limited by portable technique and body habitus. Electronically Signed   By: Randa Ngo M.D.   On: 10/03/2021 21:02      IMPRESSION AND PLAN:  Assessment and Plan: * Acute on chronic diastolic (congestive) heart failure ([place])- (present on admission) - The patient is directly admitted to a cardiac telemetry bed. - She will  be diuresed with IV Lasix.   - Will follow serial serial troponins. -  Most recent 2D echo in January revealed an EF of 55 to 60% with grade 1 diastolic dysfunction. - Cardiology consult can be obtained as needed.   Essential hypertension- (present on admission) - We will continue her antihypertensives including  Dyazide and Diovan.  Mild intermittent asthma without complication- (present on admission) Will place on her as needed albuterol MDI.  Dyslipidemia - We will continue statin therapy.  Obstructive sleep apnea- (present on admission) - We will place on CPAP nightly.  Class 3 severe obesity with serious comorbidity and body mass index (BMI) greater than or equal to 70 in adult [facility]) - Nutrition consult will be obtained. - She will have daily weights with diuresis.    DVT prophylaxis: Lovenox.  Advanced Care Planning:  Code Status: full code.  Family Communication:  The plan of care was discussed in details with the patient (and family). I answered all questions. The patient agreed to proceed with the above  mentioned plan. Further management will depend upon hospital course. Disposition Plan: Back to previous home environment Consults called: none.  All the records are reviewed and case discussed with ED provider.  Status is: Inpatient  At the time of the admission, it appears that the appropriate admission status for this patient is inpatient.  This is judged to be reasonable and necessary in order to provide the required intensity of service to ensure the patient's safety given the presenting symptoms, physical exam findings and initial radiographic and laboratory data in the context of comorbid conditions.  The patient requires inpatient status due to high intensity of service, high risk of further deterioration and high frequency of surveillance required.  I certify that at the time of admission, it is my clinical judgment that the patient will require inpatient hospital care extending more than 2 midnights.                            Dispo: The patient is from: Home              Anticipated d/c is to: Home              Patient currently is not medically stable to d/c.              Difficult to place patient: No  Christel Mormon M.D on 10/04/2021 at 5:35 AM  [facility]   From 7 PM-7 AM, contact night-coverage [email or web address]  CC: Primary care physician; Allwardt, Randa Evens, PA-C

## 2021-10-04 NOTE — Progress Notes (Signed)
Plan of Care Note for accepted transfer ? ? ?Patient: Christina Dominguez MRN: [id number]   DOA: 10/03/2021 ? ?Facility requesting transfer: [facility] ?Requesting Provider: Dr. Billy Fischer ?Reason for transfer: Acute on Chronic Diastolic Congestive Heart Failure ?Facility course:  ? ?56 year old female with past medical history of asthma, bipolar disorder 1, hypertension, morbid obesity and diastolic congestive heart failure (Echo 08/2021 EF 55-60% with G1DD) who presents to [place] emergency department with several week history of progressively worsening weight gain leg swelling and dyspnea on exertion. ? ?Of note, patient was recently hospitalized at [facility] long hospital from 1/13 until 7/35 for diastolic congestive heart failure where she was diuresed over the course of several days and lost approximately 17 pounds.  Patient explains that since that hospitalization she has gained all this weight back and has now increased 18 pounds since date of discharge according to the emergency department provider. ? ?During patient's work-up CT angiogram of the chest was additionally performed surprisingly not revealing any evidence of pulmonary edema but revealing dilated main pulmonary trunk suggestive of pulmonary hypertension as well as an ovoid posterior mediastinal and paraspinal lesion.  Radiology is recommending further characterization with MRI. ? ?ER provider still feels that this is acute congestive heart failure and has given a dose of 40 mg of Lasix and feels that the incidental finding of the mediastinal lesion can be worked up during this hospitalization as well.  Hospitalist group has been called for consideration of transfer for continued management. ? ?Plan of care: ?The patient is accepted for admission to Telemetry unit, at [facility]..  ? ? ?Author: ?Vernelle Emerald, MD ?10/04/2021 ? ?Check [email or web address] for on-call coverage. ? ?Nursing staff, Please call [place]  number on Amion as soon as patient's arrival, so appropriate admitting provider can evaluate the pt. ?

## 2021-10-04 NOTE — Assessment & Plan Note (Addendum)
Continue with statin therapy.  ?

## 2021-10-04 NOTE — Assessment & Plan Note (Addendum)
Blood pressure has improved after episodic hypotension during aggressive diuresis.  ?Her discharge blood pressure is 150/92 ? ?Plan to resume valsartan at her discharge and follow up as outpatient.  ?

## 2021-10-04 NOTE — Progress Notes (Signed)
?  Mobility Specialist Criteria Algorithm Info. ? ? 10/04/21 1400  ?Mobility  ?Activity Ambulated with assistance in hallway;Transferred from bed to chair ?(to chair after ambulation)  ?Range of Motion/Exercises Active;All extremities  ?Level of Assistance Standby assist, set-up cues, supervision of patient - no hands on  ?Assistive Device Front wheel walker  ?Distance Ambulated (ft) 280 ft  ?Activity Response Tolerated well  ? ?Patient received in bed agreeable to participate in mobility. Was independent for bed mobility and stood with minimal A + cues for hand placement. Ambulated in hallway supervision level with slow gait. SaO2 was 89-91% on RA throughout ambulation. Returned to room without complaint or incident. Was left in recliner chair with all needs met, call bell in reach. ? ?10/04/2021 ?4:52 PM ? ?Martinique Laporscha Linehan, CMS, BS EXP ?Acute Rehabilitation Services  ?YOVZC:[phone] ?Office: [phone] ? ?

## 2021-10-05 ENCOUNTER — Observation Stay (HOSPITAL_COMMUNITY): Payer: Commercial Managed Care - HMO

## 2021-10-05 DIAGNOSIS — I5033 Acute on chronic diastolic (congestive) heart failure: Secondary | ICD-10-CM | POA: Diagnosis not present

## 2021-10-05 DIAGNOSIS — Z6841 Body Mass Index (BMI) 40.0 and over, adult: Secondary | ICD-10-CM | POA: Diagnosis not present

## 2021-10-05 LAB — BASIC METABOLIC PANEL
Anion gap: 11 (ref 5–15)
BUN: 18 mg/dL (ref 6–20)
CO2: 31 mmol/L (ref 22–32)
Calcium: 8.7 mg/dL — ABNORMAL LOW (ref 8.9–10.3)
Chloride: 96 mmol/L — ABNORMAL LOW (ref 98–111)
Creatinine, Ser: 1.15 mg/dL — ABNORMAL HIGH (ref 0.44–1.00)
GFR, Estimated: 56 mL/min — ABNORMAL LOW (ref >60)
Glucose, Bld: 87 mg/dL (ref 70–99)
Potassium: 3.4 mmol/L — ABNORMAL LOW (ref 3.5–5.1)
Sodium: 138 mmol/L (ref 135–145)

## 2021-10-05 LAB — CBC
HCT: 36.4 % (ref 36.0–46.0)
Hemoglobin: 11.5 g/dL — ABNORMAL LOW (ref 12.0–15.0)
MCH: 29 pg (ref 26.0–34.0)
MCHC: 31.6 g/dL (ref 30.0–36.0)
MCV: 91.9 fL (ref 80.0–100.0)
Platelets: 329 10*3/uL (ref 150–400)
RBC: 3.96 MIL/uL (ref 3.87–5.11)
RDW: 14.7 % (ref 11.5–15.5)
WBC: 9 10*3/uL (ref 4.0–10.5)
nRBC: 0 % (ref 0.0–0.2)

## 2021-10-05 LAB — GLUCOSE, CAPILLARY: Glucose-Capillary: 126 mg/dL — ABNORMAL HIGH (ref 70–99)

## 2021-10-05 MED ORDER — POTASSIUM CHLORIDE CRYS ER 20 MEQ PO TBCR
40.0000 meq | EXTENDED_RELEASE_TABLET | Freq: Two times a day (BID) | ORAL | Status: AC
  Administered 2021-10-05 – 2021-10-06 (×2): 40 meq via ORAL
  Filled 2021-10-05 (×2): qty 2

## 2021-10-05 MED ORDER — SODIUM CHLORIDE 0.9 % IV SOLN: 250.0000 mL | INTRAVENOUS | Status: DC | PRN

## 2021-10-05 MED ORDER — SODIUM CHLORIDE 0.9 % IV SOLN: INTRAVENOUS | Status: DC

## 2021-10-05 MED ORDER — GADOBUTROL 1 MMOL/ML IV SOLN
10.0000 mL | Freq: Once | INTRAVENOUS | Status: AC | PRN
  Administered 2021-10-05: 10 mL via INTRAVENOUS

## 2021-10-05 MED ORDER — SODIUM CHLORIDE 0.9% FLUSH
3.0000 mL | Freq: Two times a day (BID) | INTRAVENOUS | Status: DC
  Administered 2021-10-05 – 2021-10-11 (×9): 3 mL via INTRAVENOUS

## 2021-10-05 MED ORDER — SODIUM CHLORIDE 0.9% FLUSH: 3.0000 mL | INTRAVENOUS | Status: DC | PRN

## 2021-10-05 MED ORDER — GUAIFENESIN-DM 100-10 MG/5ML PO SYRP
5.0000 mL | ORAL_SOLUTION | ORAL | Status: DC | PRN
  Administered 2021-10-05 – 2021-10-10 (×13): 5 mL via ORAL
  Filled 2021-10-05 (×13): qty 5

## 2021-10-05 NOTE — TOC Initial Note (Signed)
Transition of Care (TOC) - Initial/Assessment Note  ? ? ?Patient Details  ?Name: Christina Dominguez ?MRN: [id number] ?Date of Birth: Oct 06, 1965 ? ?Transition of Care (TOC) CM/SW Contact:    ?Angelita Ingles, RN ?Phone Number:[phone] ? ?10/05/2021, 2:39 PM ? ?Clinical Narrative:                 ? ?Transition of Care (TOC) Screening Note ? ? ?Patient Details  ?Name: Christina Dominguez ?Date of Birth: March 11, 1966 ? ? ?Transition of Care (TOC) CM/SW Contact:    ?Angelita Ingles, RN ?Phone Number: ?10/05/2021, 2:39 PM ? ? ? ?Transition of Care Department [facility]) has reviewed patient and no TOC needs have been identified at this time. We will continue to monitor patient advancement through interdisciplinary progression rounds. If new patient transition needs arise, please place a TOC consult. ? ? ? ?  ?  ? ? ?Patient Goals and CMS Choice ?  ?  ?  ? ?Expected Discharge Plan and Services ?  ?  ?  ?  ?  ?                ?  ?  ?  ?  ?  ?  ?  ?  ?  ?  ? ?Prior Living Arrangements/Services ?  ?  ?  ?       ?  ?  ?  ?  ? ?Activities of Daily Living ?Home Assistive Devices/Equipment: Gilford Rile (specify type), Cane (specify quad or straight) ?ADL Screening (condition at time of admission) ?Patient's cognitive ability adequate to safely complete daily activities?: Yes ?Is the patient deaf or have difficulty hearing?: No ?Does the patient have difficulty seeing, even when wearing glasses/contacts?: No ?Does the patient have difficulty concentrating, remembering, or making decisions?: No ?Patient able to express need for assistance with ADLs?: Yes ?Does the patient have difficulty dressing or bathing?: No ?Independently performs ADLs?: Yes (appropriate for developmental age) ?Does the patient have difficulty walking or climbing stairs?: Yes ?Weakness of Legs: Both ?Weakness of Arms/Hands: None ? ?Permission Sought/Granted ?  ?  ?   ?   ?   ?   ? ?Emotional Assessment ?  ?  ?  ?  ?  ?  ? 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 Diagnosis Date Noted  ? Dyslipidemia 10/04/2021  ? 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 Acute on chronic diastolic (congestive) heart failure ([place]) 10/03/2021  ? Obstructive sleep apnea 08/25/2021  ? Acute exacerbation of congestive heart failure ([place]) 08/20/2021  ? RUQ pain 08/19/2021  ? Hepatic steatosis 08/19/2021  ? Hepatomegaly 08/19/2021  ? Aortic atherosclerosis ([place]) 08/19/2021  ? Diverticulosis 08/19/2021  ? Acute congestive heart failure ([place]) 08/18/2021  ? Anxiety 08/02/2021  ? Asthma 08/02/2021  ? Bipolar 1 disorder ([place]) 08/02/2021  ? CHF (congestive heart failure) ([place]) 08/02/2021  ? Depression 08/02/2021  ? Edema of both lower extremities 08/02/2021  ? Joint pain 08/02/2021  ? Acute on chronic congestive heart failure ([place]) 08/02/2021  ? Diastolic dysfunction 09/32/6712  ? Screening for malignant neoplasm of colon 08/02/2021  ? Prediabetes 12/22/2019  ? Vitamin D insufficiency 12/22/2019  ? Class 3 severe obesity with serious comorbidity and body mass index (BMI) greater than or equal to 70 in adult [facility]) 12/21/2019  ? Essential  hypertension 04/30/2019  ? Mild intermittent asthma without complication 44/17/1278  ? ?PCP:  Allwardt, Randa Evens, PA-C ?Pharmacy:   ?[facility] [place] ?[address] ?[place] [address] ?Phone: [phone] Fax: [phone] ? ? ? ? ?Social Determinants of Health (SDOH) Interventions ?Food Insecurity Interventions: Intervention Not Indicated ?Financial Strain Interventions: Other (Comment) (May need to consider disability) ?Housing Interventions: Intervention Not Indicated ?Transportation Interventions: Intervention Not Indicated ? ?Readmission Risk  Interventions ?No flowsheet data found. ? ? ?

## 2021-10-05 NOTE — Progress Notes (Signed)
Heart Failure Nurse Navigator Progress Note ? ?Primary Cardiologist: Dr Agustin Cree ? ?Echo: EF 55-60%, G1DD ? ?Cards consulted 3/1, ? Heart Cath soon as pt is pending bariatric surgery (BMI 72) in near future. Diuresing. Sch appt in [facility] for 10/16/21 ? ?Kevan Rosebush, RN, BSN, CHFN ?Heart Failure Navigator ?Heart & Vascular Care Navigation Team ? ?

## 2021-10-05 NOTE — Progress Notes (Signed)
CSW met with patient at bedside to discuss HF TOC appointment. Patient states she has transportation to appointment and may need to consider disability. CSW will follow up with patient at HF [facility] appointment to discuss options. Patient grateful for the appointment and support. CSW available as needed. Raquel Sarna, [place], [place] ? ?

## 2021-10-05 NOTE — Plan of Care (Signed)
  Problem: Education: Goal: Ability to demonstrate management of disease process will improve Outcome: Progressing Goal: Ability to verbalize understanding of medication therapies will improve Outcome: Progressing   Problem: Activity: Goal: Capacity to carry out activities will improve Outcome: Progressing   Problem: Cardiac: Goal: Ability to achieve and maintain adequate cardiopulmonary perfusion will improve Outcome: Progressing   

## 2021-10-05 NOTE — Progress Notes (Signed)
Progress Note  Patient Name: CHLOEANNE POTEET Date of Encounter: 10/05/2021  [facility] Cardiologist: Buford Dresser, MD new  Subjective   Breathing much better, has sleep study scheduled At hosp d/c 01/16, wt was 374, by her scales, on admit was 382, today wt 368  Inpatient Medications    Scheduled Meds:  furosemide  40 mg Intravenous Q12H   heparin  5,000 Units Subcutaneous Q8H   irbesartan  37.5 mg Oral Daily   potassium chloride  40 mEq Oral BID   spironolactone  12.5 mg Oral Daily   Continuous Infusions:  PRN Meds: acetaminophen **OR** acetaminophen, albuterol, magnesium hydroxide, ondansetron **OR** ondansetron (ZOFRAN) IV, traZODone   Vital Signs    Vitals:   10/05/21 0206 10/05/21 0324 10/05/21 0454 10/05/21 1131  BP: (!) 147/84 140/66  120/79  Pulse: 78 77    Resp: 19 20  17   Temp:  98.2 F (36.8 C)  98 F (36.7 C)  TempSrc:  Oral    SpO2: 96% 92%    Weight:   (!) 167 kg   Height:        Intake/Output Summary (Last 24 hours) at 10/05/2021 1148 Last data filed at 10/05/2021 0915 Gross per 24 hour  Intake 240 ml  Output 2300 ml  Net -2060 ml   Last 3 Weights 10/05/2021 10/04/2021 09/26/2021  Weight (lbs) 368 lb 2.7 oz 377 lb 10.4 oz 383 lb  Weight (kg) 167 kg 171.3 kg 173.728 kg      Telemetry    SR - Personally Reviewed  ECG    None - Personally Reviewed  Physical Exam   GEN: No acute distress.   Neck: JVD 8-9 cm Cardiac: RRR, no murmurs, rubs, or gallops.  Respiratory: some rales bases bilaterally. GI: Soft, nontender, non-distended  MS: No edema; No deformity. Neuro:  Nonfocal  Psych: Normal affect   Labs    High Sensitivity Troponin:   Recent Labs  Lab 10/03/21 2120 10/03/21 2326  TROPONINIHS 4 3     Chemistry Recent Labs  Lab 10/03/21 2120 10/04/21 0618 10/05/21 0050  NA 135 139 138  K 3.8 3.6 3.4*  CL 96* 97* 96*  CO2 30 31 31   GLUCOSE 92 96 87  BUN 19 14 18   CREATININE 0.95 0.94 1.15*  CALCIUM 8.7* 8.7*  8.7*  PROT 7.8  --   --   ALBUMIN 3.8  --   --   AST 20  --   --   ALT 19  --   --   ALKPHOS 117  --   --   BILITOT 0.4  --   --   GFRNONAA >60 >60 56*  ANIONGAP 9 11 11     Lipids No results for input(s): CHOL, TRIG, HDL, LABVLDL, LDLCALC, CHOLHDL in the last 168 hours.  Hematology Recent Labs  Lab 10/03/21 2120 10/04/21 0618 10/05/21 0050  WBC 10.7* 7.9 9.0  RBC 3.94 3.82* 3.96  HGB 11.7* 11.0* 11.5*  HCT 36.4 35.1* 36.4  MCV 92.4 91.9 91.9  MCH 29.7 28.8 29.0  MCHC 32.1 31.3 31.6  RDW 15.0 14.9 14.7  PLT 352 281 329    BNP Recent Labs  Lab 10/03/21 2120  BNP 46.0    DDimer No results for input(s): DDIMER in the last 168 hours.   Lab Results  Component Value Date   TSH 2.230 12/21/2019   Lab Results  Component Value Date   HGBA1C 5.9 (H) 08/20/2021   Lab Results  Component  Value Date   CHOL 158 12/21/2019   HDL 58 12/21/2019   LDLCALC 88 12/21/2019   TRIG 58 12/21/2019     Radiology    CT Angio Chest PE W and/or Wo Contrast  Result Date: 10/03/2021 CLINICAL DATA:  Chest pain and shortness of breath. EXAM: CT ANGIOGRAPHY CHEST WITH CONTRAST TECHNIQUE: Multidetector CT imaging of the chest was performed using the standard protocol during bolus administration of intravenous contrast. Multiplanar CT image reconstructions and MIPs were obtained to evaluate the vascular anatomy. RADIATION DOSE REDUCTION: This exam was performed according to the departmental dose-optimization program which includes automated exposure control, adjustment of the mA and/or kV according to patient size and/or use of iterative reconstruction technique. CONTRAST:  157mL OMNIPAQUE IOHEXOL 350 MG/ML SOLN COMPARISON:  Chest radiograph dated 10/03/2021. FINDINGS: Evaluation is limited due to artifact caused by body habitus. Cardiovascular: There is no cardiomegaly or pericardial effusion. The thoracic aorta is unremarkable. The origins of the great vessels of the aortic arch appear patent.  Evaluation of the pulmonary arteries is very limited due to suboptimal opacification and timing of the contrast as well as secondary to artifact caused by body habitus. No large or central pulmonary artery embolus identified. There is dilatation of the main pulmonary trunk suggestive of pulmonary hypertension. Mediastinum/Nodes: No hilar or mediastinal adenopathy. The esophagus and thyroid gland are grossly unremarkable. No mediastinal fluid collection. There is a 4.5 x 3.0 cm ovoid structure in the posterior mediastinum along the spine at T4-T5 on the right. This lesion is not characterized on this CT but may represent a neurogenic cyst, or an enteric duplication cyst arising from the esophagus. Other etiologies include a neurogenic tumor such as a schwannoma or neurofibroma. Further characterization with MRI without and with contrast on a nonemergent/outpatient basis recommended. Lungs/Pleura: The lungs are clear. There is no pleural effusion or pneumothorax. The central airways are patent. Upper Abdomen: No acute abnormality. Musculoskeletal: Degenerative changes of the spine. No acute osseous pathology. Review of the MIP images confirms the above findings. IMPRESSION: 1. No acute intrathoracic pathology. No large or central pulmonary artery embolus identified. 2. Dilatation of the main pulmonary trunk suggestive of pulmonary hypertension. 3. Ovoid posterior mediastinal/paraspinal lesion as above. Further characterization with MRI without and with contrast on a nonemergent/outpatient basis recommended. Electronically Signed   By: Anner Crete M.D.   On: 10/03/2021 23:24   MR CHEST W WO CONTRAST  Result Date: 10/05/2021 CLINICAL DATA:  Paraspinous mass incidentally identified by prior CT EXAM: MRI CHEST WITHOUT AND WITH CONTRAST TECHNIQUE: Multisequence, multiplanar MR examination of the chest was performed both prior to and following the uncomplicated intravenous administration of gadolinium contrast.  CONTRAST:  58mL GADAVIST GADOBUTROL 1 MMOL/ML IV SOLN COMPARISON:  CT chest angiogram, 10/03/2021 FINDINGS: Cardiovascular: Normal heart size. Gross enlargement of the main pulmonary artery, at least 4.3 cm in caliber. Mediastinum: No lymphadenopathy. There is a paraspinous oval, fluid signal cystic lesion overlying the right aspect of T4-T5, which appears to arise from the pleura or neural foramen and is without associated solid component or contrast enhancement (series 9, image 13). Limited lungs: Unremarkable. Upper abdomen: Unremarkable. Chest wall and musculoskeletal: Unremarkable: IMPRESSION: 1. Paraspinous cystic lesion overlying the right aspect of T4-T5, which appears to arise from the pleura or neural foramen and is without associated solid component or contrast enhancement. This is consistent with a definitively benign pleural cyst, neurogenic cyst, or esophageal foregut duplication cyst. No further follow-up or characterization is required. 2. Gross enlargement  of the main pulmonary artery, as can be seen in pulmonary hypertension. Electronically Signed   By: Delanna Ahmadi M.D.   On: 10/05/2021 10:00   DG Chest Portable 1 View  Result Date: 10/03/2021 CLINICAL DATA:  Left upper extremity pain, shortness of breath for 3 days EXAM: PORTABLE CHEST 1 VIEW COMPARISON:  08/18/2021 FINDINGS: Single frontal view of the chest was obtained, limited by portable technique and body habitus. Cardiac silhouette is stable. No airspace disease, effusion, or pneumothorax. No acute bony abnormalities. IMPRESSION: 1. Unremarkable exam limited by portable technique and body habitus. Electronically Signed   By: Randa Ngo M.D.   On: 10/03/2021 21:02   VAS [place] LOWER EXTREMITY VENOUS (DVT)  Result Date: 10/04/2021  Lower Venous DVT Study Patient Name:  DAVENE JOBIN  Date of Exam:   10/04/2021 Medical Rec #: [id number]           Accession #:    [id number] Date of Birth: May 07, 1966            Patient Gender: F Patient  Age:   59 years Exam Location:  [facility] Procedure:      VAS [place] LOWER EXTREMITY VENOUS (DVT) Referring Phys: CALLIE GOODRICH --------------------------------------------------------------------------------  Indications: Swelling, and Edema.  Limitations: Body habitus and poor ultrasound/tissue interface. Comparison Study: no prior Performing Technologist: Archie Patten RVS  Examination Guidelines: A complete evaluation includes B-mode imaging, spectral Doppler, color Doppler, and power Doppler as needed of all accessible portions of each vessel. Bilateral testing is considered an integral part of a complete examination. Limited examinations for reoccurring indications may be performed as noted. The reflux portion of the exam is performed with the patient in reverse Trendelenburg.  +-----+---------------+---------+-----------+----------+--------------+  RIGHT Compressibility Phasicity Spontaneity Properties Thrombus Aging  +-----+---------------+---------+-----------+----------+--------------+  CFV   Full            Yes       Yes                                    +-----+---------------+---------+-----------+----------+--------------+   +---------+---------------+---------+-----------+----------+--------------+  LEFT      Compressibility Phasicity Spontaneity Properties Thrombus Aging  +---------+---------------+---------+-----------+----------+--------------+  CFV       Full            Yes       Yes                                    +---------+---------------+---------+-----------+----------+--------------+  SFJ       Full                                                             +---------+---------------+---------+-----------+----------+--------------+  FV Prox   Full                                                             +---------+---------------+---------+-----------+----------+--------------+  FV Mid    Full                                                              +---------+---------------+---------+-----------+----------+--------------+  FV Distal Full                                                             +---------+---------------+---------+-----------+----------+--------------+  PFV       Full                                                             +---------+---------------+---------+-----------+----------+--------------+  POP       Full            Yes       Yes                                    +---------+---------------+---------+-----------+----------+--------------+  PTV       Full                                                             +---------+---------------+---------+-----------+----------+--------------+  PERO      Full                                                             +---------+---------------+---------+-----------+----------+--------------+     Summary: RIGHT: - No evidence of common femoral vein obstruction.  LEFT: - There is no evidence of deep vein thrombosis in the lower extremity.  - No cystic structure found in the popliteal fossa.  *See table(s) above for measurements and observations. Electronically signed by Harold Barban MD on 10/04/2021 at 11:21:27 PM.    Final     Cardiac Studies   None this admit  ECHO: 09-06-21  1. Left ventricular ejection fraction, by estimation, is 55 to 60%. The  left ventricle has normal function. The left ventricle has no regional  wall motion abnormalities. There is mild concentric left ventricular  hypertrophy. Left ventricular diastolic parameters are consistent with Grade I diastolic dysfunction (impaired relaxation).   2. Right ventricular systolic function is normal. The right ventricular  size is normal. Tricuspid regurgitation signal is inadequate for assessing  PA pressure.   3. The mitral valve is normal in structure. No evidence of mitral valve  regurgitation. No evidence of mitral stenosis.   4. The aortic valve was not well visualized. Aortic valve regurgitation  is  not visualized. No aortic stenosis is present.   5. The inferior vena cava is normal in size with <50% respiratory  variability, suggesting right atrial pressure of 8 mmHg.   Patient Profile     56 y.o. female with a history of chronic diastolic CHF with chronic lower extremity edema, hypertension, prediabetes, mild asthma, anxiety/depression, bipolar disorder and morbid obesity, was admitted 03/01 with CHF exacerbation.  Assessment & Plan  Acute on chronic diastolic CHF - Trop neg and BNP wnl but may be falsely low - sx improved w/ IV Lasix, has good UOP - feel she is approaching dry wt - may be able to change to po Lasix tomorrow - vol status challenging to assess, get R heart cath tomorrow  2. Chest pain - ez neg MI, EF nl w/ no WMA - however, has been having sx w/ exertion, including burning chest pain and L arm pain - also has FH premature CAD - do L heart cath tomorrow, 1:30 w/ Dr Irish Lack  3. HTN - on Avapro 37.5 (sub for home med) - spiro 12.5 added this admit - HR is not low, could add BB - SBP currently 120s-140s  Otherwise, per IM   For questions or updates, please contact [facility] Please consult [email or web address] for contact info under        Signed, Rosaria Ferries, PA-C  10/05/2021, 11:48 AM

## 2021-10-05 NOTE — Progress Notes (Signed)
PROGRESS NOTE    Christina Dominguez  OVF:[id number] DOB: 07-Mar-1966 DOA: 10/03/2021 PCP: Fredirick Lathe, PA-C  Narrative56/female with morbid obesity, BMI of 73, chronic diastolic CHF, asthma, depression presented to the ED with worsening dyspnea for the past 3 to 4 days, recently discharged over a month ago after hospitalization for same. -In the ED blood pressure was 150/85, BNP 46, WBC 10.7, two-view chest x-ray was unremarkable, limited by body habitus, CT chest noted dilation of pulmonary trunk suggestive of pulmonary hypertension and posterior mediastinal/paraspinal lesion   Subjective: -Feels much better, breathing considerably better, swelling improving, used CPAP last night  Assessment and Plan:  Acute on chronic diastolic CHF Patient reported 18 pound weight gain following recent discharge, on chart review here 7lbs noted -Has been taking Lasix 40 Mg daily at baseline -Recent echo 1/23 with preserved EF, grade 1 diastolic dysfunction, normal RV function -Volume status extremely difficult to assess with morbid obesity,  -Clinically improving with diuresis she is 4.4 L liters negative -Continue IV Lasix today, Aldactone -Could change ARB to [facility], defer to cards -Monitor kidney function closely, plan for right/left heart cath when clinically euvolemic   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, neurogenic cyst, or esophageal foregut duplication cyst, no further work-up indicated   Essential hypertension -Stable on ARB, could transition to [place],   OSA -Due for sleep study in March -Continue auto titrating CPAP for now   Morbid obesity -BMI 73 -Dietitian consult, needs aggressive lifestyle modification -Has recently seen a bariatric surgeon at [facility], supposed to lose 40 pounds prior to gastric sleeve   H/o Asthma -Stable  DVT prophylaxis: Heparin subcutaneous Code Status: Full code Family  Communication: No family at bedside Disposition Plan: Home pending cardiac work-up  Consultants:  Cardiology  Procedures:   Antimicrobials:    Objective: Vitals:   10/05/21 0206 10/05/21 0324 10/05/21 0454 10/05/21 1131  BP: (!) 147/84 140/66  120/79  Pulse: 78 77    Resp: 19 20  17   Temp:  98.2 F (36.8 C)  98 F (36.7 C)  TempSrc:  Oral    SpO2: 96% 92%    Weight:   (!) 167 kg   Height:        Intake/Output Summary (Last 24 hours) at 10/05/2021 1151 Last data filed at 10/05/2021 0915 Gross per 24 hour  Intake 240 ml  Output 2300 ml  Net -2060 ml   Filed Weights   10/04/21 0244 10/05/21 0454  Weight: (!) 171.3 kg (!) 167 kg    Examination:  General exam: Morbidly obese pleasant female sitting up in bed, AAOx3, no distress Respiratory system: Decreased breath sounds at the bases, poor air movement otherwise clear Cardiovascular system: S1 & S2 heard, RRR.  Abd: nondistended, soft and nontender.Normal bowel sounds heard. Central nervous system: Alert and oriented. No focal neurological deficits. Extremities: Trace edema Skin: No rashes Psychiatry: Judgement and insight appear normal. Mood & affect appropriate.     Data Reviewed:   CBC: Recent Labs  Lab 10/03/21 2120 10/04/21 0618 10/05/21 0050  WBC 10.7* 7.9 9.0  NEUTROABS 7.6  --   --   HGB 11.7* 11.0* 11.5*  HCT 36.4 35.1* 36.4  MCV 92.4 91.9 91.9  PLT 352 281 841   Basic Metabolic Panel: Recent Labs  Lab 10/03/21 2120 10/04/21 0618 10/05/21 0050  NA 135 139 138  K 3.8 3.6 3.4*  CL 96* 97* 96*  CO2  30 31 31   GLUCOSE 92 96 87  BUN 19 14 18   CREATININE 0.95 0.94 1.15*  CALCIUM 8.7* 8.7* 8.7*   GFR: Estimated Creatinine Clearance: 81.1 mL/min (A) (by C-G formula based on SCr of 1.15 mg/dL (H)). Liver Function Tests: Recent Labs  Lab 10/03/21 2120  AST 20  ALT 19  ALKPHOS 117  BILITOT 0.4  PROT 7.8  ALBUMIN 3.8   No results for input(s): LIPASE, AMYLASE in the last 168 hours. No  results for input(s): AMMONIA in the last 168 hours. Coagulation Profile: No results for input(s): INR, PROTIME in the last 168 hours. Cardiac Enzymes: No results for input(s): CKTOTAL, CKMB, CKMBINDEX, TROPONINI in the last 168 hours. BNP (last 3 results) Recent Labs    08/09/21 1651 08/25/21 1050 09/26/21 1138  PROBNP 106 38 115   HbA1C: No results for input(s): HGBA1C in the last 72 hours. CBG: Recent Labs  Lab 10/05/21 0614  GLUCAP 126*   Lipid Profile: No results for input(s): CHOL, HDL, LDLCALC, TRIG, CHOLHDL, LDLDIRECT in the last 72 hours. Thyroid Function Tests: No results for input(s): TSH, T4TOTAL, FREET4, T3FREE, THYROIDAB in the last 72 hours. Anemia Panel: No results for input(s): VITAMINB12, FOLATE, FERRITIN, TIBC, IRON, RETICCTPCT in the last 72 hours. Urine analysis:    Component Value Date/Time   COLORURINE YELLOW 08/18/2021 [place] 08/18/2021 1253   LABSPEC 1.010 08/18/2021 1253   PHURINE 5.5 08/18/2021 1253   GLUCOSEU NEGATIVE 08/18/2021 1253   HGBUR TRACE (A) 08/18/2021 [place] 08/18/2021 1253   KETONESUR NEGATIVE 08/18/2021 1253   PROTEINUR NEGATIVE 08/18/2021 1253   NITRITE NEGATIVE 08/18/2021 1253   LEUKOCYTESUR NEGATIVE 08/18/2021 1253   Sepsis Labs: @LABRCNTIP (procalcitonin:4,lacticidven:4)  ) Recent Results (from the past 240 hour(s))  Resp Panel by RT-PCR (Flu A&B, Covid) Nasopharyngeal Swab     Status: None   Collection Time: 10/03/21 11:27 PM   Specimen: Nasopharyngeal Swab; Nasopharyngeal(NP) swabs in vial transport medium  Result Value Ref Range Status   SARS Coronavirus 2 by RT PCR NEGATIVE NEGATIVE Final    Comment: (NOTE) SARS-CoV-2 target nucleic acids are NOT DETECTED.  The SARS-CoV-2 RNA is generally detectable in upper respiratory specimens during the acute phase of infection. The lowest concentration of SARS-CoV-2 viral copies this assay can detect is 138 copies/mL. A negative  result does not preclude SARS-Cov-2 infection and should not be used as the sole basis for treatment or other patient management decisions. A negative result may occur with  improper specimen collection/handling, submission of specimen other than nasopharyngeal swab, presence of viral mutation(s) within the areas targeted by this assay, and inadequate number of viral copies(<138 copies/mL). A negative result must be combined with clinical observations, patient history, and epidemiological information. The expected result is Negative.  Fact Sheet for Patients:  EntrepreneurPulse.com.au  Fact Sheet for Healthcare Providers:  IncredibleEmployment.be  This test is no t yet approved or cleared by the [place] FDA and  has been authorized for detection and/or diagnosis of SARS-CoV-2 by FDA under an Emergency Use Authorization (EUA). This EUA will remain  in effect (meaning this test can be used) for the duration of the COVID-19 declaration under Section 564(b)(1) of the Act, 21 U.S.C.section 360bbb-3(b)(1), unless the authorization is terminated  or revoked sooner.       Influenza A by PCR NEGATIVE NEGATIVE Final   Influenza B by PCR NEGATIVE NEGATIVE Final    Comment: (NOTE) The Xpert Xpress SARS-CoV-2/FLU/RSV plus assay is  intended as an aid in the diagnosis of influenza from Nasopharyngeal swab specimens and should not be used as a sole basis for treatment. Nasal washings and aspirates are unacceptable for Xpert Xpress SARS-CoV-2/FLU/RSV testing.  Fact Sheet for Patients: EntrepreneurPulse.com.au  Fact Sheet for Healthcare Providers: IncredibleEmployment.be  This test is not yet approved or cleared by the [place] FDA and has been authorized for detection and/or diagnosis of SARS-CoV-2 by FDA under an Emergency Use Authorization (EUA). This EUA will remain in effect (meaning this test can be used)  for the duration of the COVID-19 declaration under Section 564(b)(1) of the Act, 21 U.S.C. section 360bbb-3(b)(1), unless the authorization is terminated or revoked.  Performed at [facility], [address] [place], [place] [address]   MRSA Next Gen by PCR, Nasal     Status: None   Collection Time: 10/04/21  2:57 AM   Specimen: Nasal Mucosa; Nasal Swab  Result Value Ref Range Status   MRSA by PCR Next Gen NOT DETECTED NOT DETECTED Final    Comment: (NOTE) The GeneXpert MRSA Assay (FDA approved for NASAL specimens only), is one component of a comprehensive MRSA colonization surveillance program. It is not intended to diagnose MRSA infection nor to guide or monitor treatment for MRSA infections. Test performance is not FDA approved in patients less than 4 years old. Performed at [facility], [place] [address] [place], [place] [address]      Radiology Studies: CT Angio Chest PE W and/or Wo Contrast  Result Date: 10/03/2021 CLINICAL DATA:  Chest pain and shortness of breath. EXAM: CT ANGIOGRAPHY CHEST WITH CONTRAST TECHNIQUE: Multidetector CT imaging of the chest was performed using the standard protocol during bolus administration of intravenous contrast. Multiplanar CT image reconstructions and MIPs were obtained to evaluate the vascular anatomy. RADIATION DOSE REDUCTION: This exam was performed according to the departmental dose-optimization program which includes automated exposure control, adjustment of the mA and/or kV according to patient size and/or use of iterative reconstruction technique. CONTRAST:  180mL OMNIPAQUE IOHEXOL 350 MG/ML SOLN COMPARISON:  Chest radiograph dated 10/03/2021. FINDINGS: Evaluation is limited due to artifact caused by body habitus. Cardiovascular: There is no cardiomegaly or pericardial effusion. The thoracic aorta is unremarkable. The origins of the great vessels of the aortic arch appear patent. Evaluation of the pulmonary arteries is  very limited due to suboptimal opacification and timing of the contrast as well as secondary to artifact caused by body habitus. No large or central pulmonary artery embolus identified. There is dilatation of the main pulmonary trunk suggestive of pulmonary hypertension. Mediastinum/Nodes: No hilar or mediastinal adenopathy. The esophagus and thyroid gland are grossly unremarkable. No mediastinal fluid collection. There is a 4.5 x 3.0 cm ovoid structure in the posterior mediastinum along the spine at T4-T5 on the right. This lesion is not characterized on this CT but may represent a neurogenic cyst, or an enteric duplication cyst arising from the esophagus. Other etiologies include a neurogenic tumor such as a schwannoma or neurofibroma. Further characterization with MRI without and with contrast on a nonemergent/outpatient basis recommended. Lungs/Pleura: The lungs are clear. There is no pleural effusion or pneumothorax. The central airways are patent. Upper Abdomen: No acute abnormality. Musculoskeletal: Degenerative changes of the spine. No acute osseous pathology. Review of the MIP images confirms the above findings. IMPRESSION: 1. No acute intrathoracic pathology. No large or central pulmonary artery embolus identified. 2. Dilatation of the main pulmonary trunk suggestive of pulmonary hypertension. 3. Ovoid  posterior mediastinal/paraspinal lesion as above. Further characterization with MRI without and with contrast on a nonemergent/outpatient basis recommended. Electronically Signed   By: Anner Crete M.D.   On: 10/03/2021 23:24   MR CHEST W WO CONTRAST  Result Date: 10/05/2021 CLINICAL DATA:  Paraspinous mass incidentally identified by prior CT EXAM: MRI CHEST WITHOUT AND WITH CONTRAST TECHNIQUE: Multisequence, multiplanar MR examination of the chest was performed both prior to and following the uncomplicated intravenous administration of gadolinium contrast. CONTRAST:  61mL GADAVIST GADOBUTROL 1  MMOL/ML IV SOLN COMPARISON:  CT chest angiogram, 10/03/2021 FINDINGS: Cardiovascular: Normal heart size. Gross enlargement of the main pulmonary artery, at least 4.3 cm in caliber. Mediastinum: No lymphadenopathy. There is a paraspinous oval, fluid signal cystic lesion overlying the right aspect of T4-T5, which appears to arise from the pleura or neural foramen and is without associated solid component or contrast enhancement (series 9, image 13). Limited lungs: Unremarkable. Upper abdomen: Unremarkable. Chest wall and musculoskeletal: Unremarkable: IMPRESSION: 1. Paraspinous cystic lesion overlying the right aspect of T4-T5, which appears to arise from the pleura or neural foramen and is without associated solid component or contrast enhancement. This is consistent with a definitively benign pleural cyst, neurogenic cyst, or esophageal foregut duplication cyst. No further follow-up or characterization is required. 2. Gross enlargement of the main pulmonary artery, as can be seen in pulmonary hypertension. Electronically Signed   By: Delanna Ahmadi M.D.   On: 10/05/2021 10:00   DG Chest Portable 1 View  Result Date: 10/03/2021 CLINICAL DATA:  Left upper extremity pain, shortness of breath for 3 days EXAM: PORTABLE CHEST 1 VIEW COMPARISON:  08/18/2021 FINDINGS: Single frontal view of the chest was obtained, limited by portable technique and body habitus. Cardiac silhouette is stable. No airspace disease, effusion, or pneumothorax. No acute bony abnormalities. IMPRESSION: 1. Unremarkable exam limited by portable technique and body habitus. Electronically Signed   By: Randa Ngo M.D.   On: 10/03/2021 21:02   VAS [place] LOWER EXTREMITY VENOUS (DVT)  Result Date: 10/04/2021  Lower Venous DVT Study Patient Name:  Christina Dominguez  Date of Exam:   10/04/2021 Medical Rec #: [id number]           Accession #:    [id number] Date of Birth: Dec 07, 1965            Patient Gender: F Patient Age:   10 years Exam Location:  [facility] Procedure:      VAS [place] LOWER EXTREMITY VENOUS (DVT) Referring Phys: CALLIE GOODRICH --------------------------------------------------------------------------------  Indications: Swelling, and Edema.  Limitations: Body habitus and poor ultrasound/tissue interface. Comparison Study: no prior Performing Technologist: Archie Patten RVS  Examination Guidelines: A complete evaluation includes B-mode imaging, spectral Doppler, color Doppler, and power Doppler as needed of all accessible portions of each vessel. Bilateral testing is considered an integral part of a complete examination. Limited examinations for reoccurring indications may be performed as noted. The reflux portion of the exam is performed with the patient in reverse Trendelenburg.  +-----+---------------+---------+-----------+----------+--------------+  RIGHT Compressibility Phasicity Spontaneity Properties Thrombus Aging  +-----+---------------+---------+-----------+----------+--------------+  CFV   Full            Yes       Yes                                    +-----+---------------+---------+-----------+----------+--------------+   +---------+---------------+---------+-----------+----------+--------------+  LEFT      Compressibility  Phasicity Spontaneity Properties Thrombus Aging  +---------+---------------+---------+-----------+----------+--------------+  CFV       Full            Yes       Yes                                    +---------+---------------+---------+-----------+----------+--------------+  SFJ       Full                                                             +---------+---------------+---------+-----------+----------+--------------+  FV Prox   Full                                                             +---------+---------------+---------+-----------+----------+--------------+  FV Mid    Full                                                              +---------+---------------+---------+-----------+----------+--------------+  FV Distal Full                                                             +---------+---------------+---------+-----------+----------+--------------+  PFV       Full                                                             +---------+---------------+---------+-----------+----------+--------------+  POP       Full            Yes       Yes                                    +---------+---------------+---------+-----------+----------+--------------+  PTV       Full                                                             +---------+---------------+---------+-----------+----------+--------------+  PERO      Full                                                             +---------+---------------+---------+-----------+----------+--------------+  Summary: RIGHT: - No evidence of common femoral vein obstruction.  LEFT: - There is no evidence of deep vein thrombosis in the lower extremity.  - No cystic structure found in the popliteal fossa.  *See table(s) above for measurements and observations. Electronically signed by Harold Barban MD on 10/04/2021 at 11:21:27 PM.    Final      Scheduled Meds:  furosemide  40 mg Intravenous Q12H   heparin  5,000 Units Subcutaneous Q8H   irbesartan  37.5 mg Oral Daily   potassium chloride  40 mEq Oral BID   spironolactone  12.5 mg Oral Daily   Continuous Infusions:   LOS: 1 day    Time spent:71min    Domenic Polite, MD [facility]   10/05/2021, 11:51 AM

## 2021-10-05 NOTE — Progress Notes (Signed)
?  Mobility Specialist Criteria Algorithm Info. ? ? 10/05/21 1520  ?Mobility  ?Activity Ambulated with assistance in hallway;Ambulated with assistance to bathroom;Transferred from bed to chair ?(to chair after ambulation)  ?Range of Motion/Exercises Active;All extremities  ?Level of Assistance Standby assist, set-up cues, supervision of patient - no hands on  ?Assistive Device Front wheel walker  ?Distance Ambulated (ft) 420 ft  ?Activity Response Tolerated well  ? ?Patient received in bed eager to participate. Required minimal HHA to stand + cues for hand placement. Ambulated to bathroom then in hallway supervision level with steady gait. Required standing rest recovery x2. Tolerated well without complaint or incident. Was left lying supine in bed with all needs met, call bell in reach. ? ?10/05/2021 ?3:55 PM ? ?[place] Shawnia Vizcarrondo, CMS, BS EXP ?Acute Rehabilitation Services  ?YYFRT:[phone] ?Office: [phone] ? ?

## 2021-10-05 NOTE — Progress Notes (Signed)
Set patient up on CPAP auto titrate with full face mask.  Patient is tolerating well at this time.  Patient advised if she has any issues to have RN call RT. ?

## 2021-10-06 ENCOUNTER — Encounter (HOSPITAL_COMMUNITY)
Admission: EM | Disposition: A | Discharge status: Home / Self Care | Payer: Self-pay | Source: Home / Self Care | Attending: Internal Medicine

## 2021-10-06 ENCOUNTER — Institutional Professional Consult (permissible substitution): Payer: Managed Care, Other (non HMO) | Admitting: Pulmonary Disease

## 2021-10-06 DIAGNOSIS — R072 Precordial pain: Secondary | ICD-10-CM

## 2021-10-06 DIAGNOSIS — I10 Essential (primary) hypertension: Secondary | ICD-10-CM | POA: Diagnosis not present

## 2021-10-06 DIAGNOSIS — I5033 Acute on chronic diastolic (congestive) heart failure: Secondary | ICD-10-CM | POA: Diagnosis not present

## 2021-10-06 HISTORY — PX: RIGHT/LEFT HEART CATH AND CORONARY ANGIOGRAPHY: CATH118266

## 2021-10-06 LAB — POCT I-STAT EG7
Acid-Base Excess: 5 mmol/L — ABNORMAL HIGH (ref 0.0–2.0)
Acid-Base Excess: 6 mmol/L — ABNORMAL HIGH (ref 0.0–2.0)
Bicarbonate: 33.7 mmol/L — ABNORMAL HIGH (ref 20.0–28.0)
Bicarbonate: 34.2 mmol/L — ABNORMAL HIGH (ref 20.0–28.0)
Calcium, Ion: 1.13 mmol/L — ABNORMAL LOW (ref 1.15–1.40)
Calcium, Ion: 1.21 mmol/L (ref 1.15–1.40)
HCT: 37 % (ref 36.0–46.0)
HCT: 38 % (ref 36.0–46.0)
Hemoglobin: 12.6 g/dL (ref 12.0–15.0)
Hemoglobin: 12.9 g/dL (ref 12.0–15.0)
O2 Saturation: 71 %
O2 Saturation: 73 %
Potassium: 4.2 mmol/L (ref 3.5–5.1)
Potassium: 4.4 mmol/L (ref 3.5–5.1)
Sodium: 137 mmol/L (ref 135–145)
Sodium: 139 mmol/L (ref 135–145)
TCO2: 36 mmol/L — ABNORMAL HIGH (ref 22–32)
TCO2: 36 mmol/L — ABNORMAL HIGH (ref 22–32)
pCO2, Ven: 68 mmHg — ABNORMAL HIGH (ref 44–60)
pCO2, Ven: 68.1 mmHg — ABNORMAL HIGH (ref 44–60)
pH, Ven: 7.303 (ref 7.25–7.43)
pH, Ven: 7.308 (ref 7.25–7.43)
pO2, Ven: 42 mmHg (ref 32–45)
pO2, Ven: 44 mmHg (ref 32–45)

## 2021-10-06 LAB — POCT I-STAT 7, (LYTES, BLD GAS, ICA,H+H)
Acid-Base Excess: 6 mmol/L — ABNORMAL HIGH (ref 0.0–2.0)
Bicarbonate: 33.7 mmol/L — ABNORMAL HIGH (ref 20.0–28.0)
Calcium, Ion: 1.17 mmol/L (ref 1.15–1.40)
HCT: 37 % (ref 36.0–46.0)
Hemoglobin: 12.6 g/dL (ref 12.0–15.0)
O2 Saturation: 97 %
Potassium: 4.2 mmol/L (ref 3.5–5.1)
Sodium: 137 mmol/L (ref 135–145)
TCO2: 36 mmol/L — ABNORMAL HIGH (ref 22–32)
pCO2 arterial: 64.9 mmHg — ABNORMAL HIGH (ref 32–48)
pH, Arterial: 7.323 — ABNORMAL LOW (ref 7.35–7.45)
pO2, Arterial: 100 mmHg (ref 83–108)

## 2021-10-06 LAB — BASIC METABOLIC PANEL
Anion gap: 10 (ref 5–15)
BUN: 18 mg/dL (ref 6–20)
CO2: 31 mmol/L (ref 22–32)
Calcium: 8.8 mg/dL — ABNORMAL LOW (ref 8.9–10.3)
Chloride: 92 mmol/L — ABNORMAL LOW (ref 98–111)
Creatinine, Ser: 1.1 mg/dL — ABNORMAL HIGH (ref 0.44–1.00)
GFR, Estimated: 59 mL/min — ABNORMAL LOW (ref >60)
Glucose, Bld: 114 mg/dL — ABNORMAL HIGH (ref 70–99)
Potassium: 3.7 mmol/L (ref 3.5–5.1)
Sodium: 133 mmol/L — ABNORMAL LOW (ref 135–145)

## 2021-10-06 SURGERY — RIGHT/LEFT HEART CATH AND CORONARY ANGIOGRAPHY
Anesthesia: LOCAL

## 2021-10-06 MED ORDER — VERAPAMIL HCL 2.5 MG/ML IV SOLN
INTRAVENOUS | Status: AC
  Filled 2021-10-06: qty 2

## 2021-10-06 MED ORDER — SODIUM CHLORIDE 0.9 % IV SOLN: 250.0000 mL | INTRAVENOUS | Status: DC | PRN

## 2021-10-06 MED ORDER — HEPARIN SODIUM (PORCINE) 1000 UNIT/ML IJ SOLN
INTRAMUSCULAR | Status: AC
  Filled 2021-10-06: qty 10

## 2021-10-06 MED ORDER — LIDOCAINE HCL (PF) 1 % IJ SOLN
INTRAMUSCULAR | Status: AC
  Filled 2021-10-06: qty 30

## 2021-10-06 MED ORDER — MIDAZOLAM HCL 2 MG/2ML IJ SOLN
INTRAMUSCULAR | Status: DC | PRN
  Administered 2021-10-06: 1 mg via INTRAVENOUS

## 2021-10-06 MED ORDER — ORAL CARE MOUTH RINSE
15.0000 mL | Freq: Two times a day (BID) | OROMUCOSAL | Status: DC
  Administered 2021-10-06 – 2021-10-11 (×9): 15 mL via OROMUCOSAL

## 2021-10-06 MED ORDER — IOHEXOL 350 MG/ML SOLN
INTRAVENOUS | Status: DC | PRN
  Administered 2021-10-06: 50 mL via INTRA_ARTERIAL

## 2021-10-06 MED ORDER — SODIUM CHLORIDE 0.9% FLUSH: 3.0000 mL | INTRAVENOUS | Status: DC | PRN

## 2021-10-06 MED ORDER — MIDAZOLAM HCL 2 MG/2ML IJ SOLN
INTRAMUSCULAR | Status: AC
  Filled 2021-10-06: qty 2

## 2021-10-06 MED ORDER — HYDRALAZINE HCL 20 MG/ML IJ SOLN: 10.0000 mg | INTRAMUSCULAR | Status: AC | PRN

## 2021-10-06 MED ORDER — FENTANYL CITRATE (PF) 100 MCG/2ML IJ SOLN
INTRAMUSCULAR | Status: DC | PRN
  Administered 2021-10-06: 25 ug via INTRAVENOUS

## 2021-10-06 MED ORDER — LABETALOL HCL 5 MG/ML IV SOLN: 10.0000 mg | INTRAVENOUS | Status: AC | PRN

## 2021-10-06 MED ORDER — VERAPAMIL HCL 2.5 MG/ML IV SOLN
INTRAVENOUS | Status: DC | PRN
  Administered 2021-10-06 (×2): 10 mL via INTRA_ARTERIAL

## 2021-10-06 MED ORDER — HEPARIN (PORCINE) IN NACL 1000-0.9 UT/500ML-% IV SOLN
INTRAVENOUS | Status: DC | PRN
  Administered 2021-10-06 (×2): 500 mL

## 2021-10-06 MED ORDER — FENTANYL CITRATE (PF) 100 MCG/2ML IJ SOLN
INTRAMUSCULAR | Status: AC
  Filled 2021-10-06: qty 2

## 2021-10-06 MED ORDER — HEPARIN SODIUM (PORCINE) 1000 UNIT/ML IJ SOLN
INTRAMUSCULAR | Status: DC | PRN
  Administered 2021-10-06: 6000 [IU] via INTRAVENOUS

## 2021-10-06 MED ORDER — LIDOCAINE HCL (PF) 1 % IJ SOLN
INTRAMUSCULAR | Status: DC | PRN
  Administered 2021-10-06: 4 mL

## 2021-10-06 MED ORDER — ACETAMINOPHEN 325 MG PO TABS
650.0000 mg | ORAL_TABLET | ORAL | Status: DC | PRN
  Administered 2021-10-06 – 2021-10-10 (×11): 650 mg via ORAL
  Filled 2021-10-06 (×11): qty 2

## 2021-10-06 MED ORDER — SODIUM CHLORIDE 0.9% FLUSH
3.0000 mL | Freq: Two times a day (BID) | INTRAVENOUS | Status: DC
  Administered 2021-10-06 – 2021-10-11 (×8): 3 mL via INTRAVENOUS

## 2021-10-06 MED ORDER — HEPARIN (PORCINE) IN NACL 1000-0.9 UT/500ML-% IV SOLN
INTRAVENOUS | Status: AC
  Filled 2021-10-06: qty 1000

## 2021-10-06 MED ORDER — ONDANSETRON HCL 4 MG/2ML IJ SOLN: 4.0000 mg | Freq: Four times a day (QID) | INTRAMUSCULAR | Status: DC | PRN

## 2021-10-06 SURGICAL SUPPLY — 13 items
CATH 5FR JL3.5 JR4 ANG PIG MP (CATHETERS) ×1 IMPLANT
CATH BALLN WEDGE 5F 110CM (CATHETERS) ×1 IMPLANT
DEVICE RAD TR BAND REGULAR (VASCULAR PRODUCTS) ×1 IMPLANT
GLIDESHEATH SLEND SS 6F .021 (SHEATH) ×1 IMPLANT
GUIDEWIRE INQWIRE 1.5J.035X260 (WIRE) IMPLANT
INQWIRE 1.5J .035X260CM (WIRE) ×2
KIT HEART LEFT (KITS) ×2 IMPLANT
MAT PREVALON FULL STRYKER (MISCELLANEOUS) ×1 IMPLANT
PACK CARDIAC CATHETERIZATION (CUSTOM PROCEDURE TRAY) ×2 IMPLANT
SHEATH GLIDE SLENDER 4/5FR (SHEATH) ×1 IMPLANT
SHEATH PROBE COVER 6X72 (BAG) ×1 IMPLANT
TRANSDUCER W/STOPCOCK (MISCELLANEOUS) ×2 IMPLANT
TUBING CIL FLEX 10 FLL-RA (TUBING) ×2 IMPLANT

## 2021-10-06 NOTE — Interval H&P Note (Signed)
Cath Lab Visit (complete for each Cath Lab visit) ? ?Clinical Evaluation Leading to the Procedure:  ? ?ACS: Yes.   ? ?Non-ACS:   ? ?Anginal Classification: CCS IV ? ?Anti-ischemic medical therapy: Minimal Therapy (1 class of medications) ? ?Non-Invasive Test Results: No non-invasive testing performed ? ?Prior CABG: No previous CABG ? ? ? ? ? ?History and Physical Interval Note: ? ?10/06/2021 ?4:15 PM ? ?Christina Dominguez  has presented today for surgery, with the diagnosis of Heart Failure.  The various methods of treatment have been discussed with the patient and family. After consideration of risks, benefits and other options for treatment, the patient has consented to  Procedure(s): ?RIGHT/LEFT HEART CATH AND CORONARY ANGIOGRAPHY (N/A) as a surgical intervention.  The patient's history has been reviewed, patient examined, no change in status, stable for surgery.  I have reviewed the patient's chart and labs.  Questions were answered to the patient's satisfaction.   ? ? ?Larae Grooms ? ? ?

## 2021-10-06 NOTE — Progress Notes (Signed)
PROGRESS NOTE    Christina Dominguez  RWE:[id number] DOB: Dec 10, 1965 DOA: 10/03/2021 PCP: Fredirick Lathe, PA-C  Narrative56/female with morbid obesity, BMI of 73, chronic diastolic CHF, asthma, depression presented to the ED with worsening dyspnea for the past 3 to 4 days, recently discharged over a month ago after hospitalization for same. -In the ED blood pressure was 150/85, BNP 46, WBC 10.7, two-view chest x-ray was unremarkable, limited by body habitus, CT chest noted dilation of pulmonary trunk suggestive of pulmonary hypertension and posterior mediastinal/paraspinal lesion -Improving with diuresis   Subjective: -Feels better overall, breathing better  Assessment and Plan:  Acute on chronic diastolic CHF Patient reported 18 pound weight gain following recent discharge, on chart review here 7lbs noted -Has been taking Lasix 40 Mg daily at baseline -Recent echo 1/23 with preserved EF, grade 1 diastolic dysfunction, normal RV function -Volume status extremely difficult to assess with morbid obesity,  -Clinically improving with diuresis, 6.1 L negative -Remains on IV Lasix and Aldactone, going down for left/right heart cath today -Switch ARB to [facility] tomorrow if kidney function stays stable -Monitor I's/O, daily weights, BMP in a.m.   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, neurogenic cyst, or esophageal foregut duplication cyst, no further work-up indicated   Essential hypertension -Stable on ARB, could transition to [place],   OSA -Due for sleep study in March -Continue auto titrating CPAP for now   Morbid obesity -BMI 73 -Dietitian consult, needs aggressive lifestyle modification -Has recently seen a bariatric surgeon at [facility], supposed to lose 40 pounds prior to gastric sleeve   H/o Asthma -Stable  DVT prophylaxis: Heparin subcutaneous Code Status: Full code Family Communication: No family at  bedside Disposition Plan: Home pending cardiac work-up  Consultants:  Cardiology  Procedures:   Antimicrobials:    Objective: Vitals:   10/05/21 1937 10/05/21 2314 10/06/21 0506 10/06/21 0802  BP: 131/86 133/83 130/82 (!) 113/97  Pulse: 80 87 81 86  Resp: 19 (!) 21 17 (!) 22  Temp: 98.2 F (36.8 C) 97.6 F (36.4 C) 98.5 F (36.9 C) 98.5 F (36.9 C)  TempSrc: Oral Oral Oral Oral  SpO2: 99% 94% 93% 94%  Weight:   (!) 166.7 kg   Height:        Intake/Output Summary (Last 24 hours) at 10/06/2021 1126 Last data filed at 10/06/2021 0600 Gross per 24 hour  Intake 717.12 ml  Output 2650 ml  Net -1932.88 ml   Filed Weights   10/04/21 0244 10/05/21 0454 10/06/21 0506  Weight: (!) 171.3 kg (!) 167 kg (!) 166.7 kg    Examination:  General exam: Morbidly obese pleasant female sitting up in bed, AAOx3, no distress HEENT: Neck obese unable to assess JVD CVS: S1-S2, regular rhythm Lungs: Clear bilaterally Abdomen: Soft, nontender, bowel sounds present Extremities: No edema Skin: No rashes Psychiatry: Judgement and insight appear normal. Mood & affect appropriate.     Data Reviewed:   CBC: Recent Labs  Lab 10/03/21 2120 10/04/21 0618 10/05/21 0050  WBC 10.7* 7.9 9.0  NEUTROABS 7.6  --   --   HGB 11.7* 11.0* 11.5*  HCT 36.4 35.1* 36.4  MCV 92.4 91.9 91.9  PLT 352 281 619   Basic Metabolic Panel: Recent Labs  Lab 10/03/21 2120 10/04/21 0618 10/05/21 0050 10/06/21 0113  NA 135 139 138 133*  K 3.8 3.6 3.4* 3.7  CL 96* 97* 96* 92*  CO2 30 31 31  31  GLUCOSE 92 96 87 114*  BUN 19 14 18 18   CREATININE 0.95 0.94 1.15* 1.10*  CALCIUM 8.7* 8.7* 8.7* 8.8*   GFR: Estimated Creatinine Clearance: 84.7 mL/min (A) (by C-G formula based on SCr of 1.1 mg/dL (H)). Liver Function Tests: Recent Labs  Lab 10/03/21 2120  AST 20  ALT 19  ALKPHOS 117  BILITOT 0.4  PROT 7.8  ALBUMIN 3.8   No results for input(s): LIPASE, AMYLASE in the last 168 hours. No results for  input(s): AMMONIA in the last 168 hours. Coagulation Profile: No results for input(s): INR, PROTIME in the last 168 hours. Cardiac Enzymes: No results for input(s): CKTOTAL, CKMB, CKMBINDEX, TROPONINI in the last 168 hours. BNP (last 3 results) Recent Labs    08/09/21 1651 08/25/21 1050 09/26/21 1138  PROBNP 106 38 115   HbA1C: No results for input(s): HGBA1C in the last 72 hours. CBG: Recent Labs  Lab 10/05/21 0614  GLUCAP 126*   Lipid Profile: No results for input(s): CHOL, HDL, LDLCALC, TRIG, CHOLHDL, LDLDIRECT in the last 72 hours. Thyroid Function Tests: No results for input(s): TSH, T4TOTAL, FREET4, T3FREE, THYROIDAB in the last 72 hours. Anemia Panel: No results for input(s): VITAMINB12, FOLATE, FERRITIN, TIBC, IRON, RETICCTPCT in the last 72 hours. Urine analysis:    Component Value Date/Time   COLORURINE YELLOW 08/18/2021 [place] 08/18/2021 1253   LABSPEC 1.010 08/18/2021 1253   PHURINE 5.5 08/18/2021 1253   GLUCOSEU NEGATIVE 08/18/2021 1253   HGBUR TRACE (A) 08/18/2021 [place] 08/18/2021 1253   KETONESUR NEGATIVE 08/18/2021 1253   PROTEINUR NEGATIVE 08/18/2021 1253   NITRITE NEGATIVE 08/18/2021 1253   LEUKOCYTESUR NEGATIVE 08/18/2021 1253   Sepsis Labs: @LABRCNTIP (procalcitonin:4,lacticidven:4)  ) Recent Results (from the past 240 hour(s))  Resp Panel by RT-PCR (Flu A&B, Covid) Nasopharyngeal Swab     Status: None   Collection Time: 10/03/21 11:27 PM   Specimen: Nasopharyngeal Swab; Nasopharyngeal(NP) swabs in vial transport medium  Result Value Ref Range Status   SARS Coronavirus 2 by RT PCR NEGATIVE NEGATIVE Final    Comment: (NOTE) SARS-CoV-2 target nucleic acids are NOT DETECTED.  The SARS-CoV-2 RNA is generally detectable in upper respiratory specimens during the acute phase of infection. The lowest concentration of SARS-CoV-2 viral copies this assay can detect is 138 copies/mL. A negative result does not  preclude SARS-Cov-2 infection and should not be used as the sole basis for treatment or other patient management decisions. A negative result may occur with  improper specimen collection/handling, submission of specimen other than nasopharyngeal swab, presence of viral mutation(s) within the areas targeted by this assay, and inadequate number of viral copies(<138 copies/mL). A negative result must be combined with clinical observations, patient history, and epidemiological information. The expected result is Negative.  Fact Sheet for Patients:  EntrepreneurPulse.com.au  Fact Sheet for Healthcare Providers:  IncredibleEmployment.be  This test is no t yet approved or cleared by the [place] FDA and  has been authorized for detection and/or diagnosis of SARS-CoV-2 by FDA under an Emergency Use Authorization (EUA). This EUA will remain  in effect (meaning this test can be used) for the duration of the COVID-19 declaration under Section 564(b)(1) of the Act, 21 U.S.C.section 360bbb-3(b)(1), unless the authorization is terminated  or revoked sooner.       Influenza A by PCR NEGATIVE NEGATIVE Final   Influenza B by PCR NEGATIVE NEGATIVE Final    Comment: (NOTE) The Xpert Xpress SARS-CoV-2/FLU/RSV plus  assay is intended as an aid in the diagnosis of influenza from Nasopharyngeal swab specimens and should not be used as a sole basis for treatment. Nasal washings and aspirates are unacceptable for Xpert Xpress SARS-CoV-2/FLU/RSV testing.  Fact Sheet for Patients: EntrepreneurPulse.com.au  Fact Sheet for Healthcare Providers: IncredibleEmployment.be  This test is not yet approved or cleared by the [place] FDA and has been authorized for detection and/or diagnosis of SARS-CoV-2 by FDA under an Emergency Use Authorization (EUA). This EUA will remain in effect (meaning this test can be used) for the  duration of the COVID-19 declaration under Section 564(b)(1) of the Act, 21 U.S.C. section 360bbb-3(b)(1), unless the authorization is terminated or revoked.  Performed at [facility], [address] [place], [place] [address]   MRSA Next Gen by PCR, Nasal     Status: None   Collection Time: 10/04/21  2:57 AM   Specimen: Nasal Mucosa; Nasal Swab  Result Value Ref Range Status   MRSA by PCR Next Gen NOT DETECTED NOT DETECTED Final    Comment: (NOTE) The GeneXpert MRSA Assay (FDA approved for NASAL specimens only), is one component of a comprehensive MRSA colonization surveillance program. It is not intended to diagnose MRSA infection nor to guide or monitor treatment for MRSA infections. Test performance is not FDA approved in patients less than 17 years old. Performed at [facility], [place] [address], [place], [place] [address]      Radiology Studies: MR CHEST W WO CONTRAST  Result Date: 10/05/2021 CLINICAL DATA:  Paraspinous mass incidentally identified by prior CT EXAM: MRI CHEST WITHOUT AND WITH CONTRAST TECHNIQUE: Multisequence, multiplanar MR examination of the chest was performed both prior to and following the uncomplicated intravenous administration of gadolinium contrast. CONTRAST:  20mL GADAVIST GADOBUTROL 1 MMOL/ML IV SOLN COMPARISON:  CT chest angiogram, 10/03/2021 FINDINGS: Cardiovascular: Normal heart size. Gross enlargement of the main pulmonary artery, at least 4.3 cm in caliber. Mediastinum: No lymphadenopathy. There is a paraspinous oval, fluid signal cystic lesion overlying the right aspect of T4-T5, which appears to arise from the pleura or neural foramen and is without associated solid component or contrast enhancement (series 9, image 13). Limited lungs: Unremarkable. Upper abdomen: Unremarkable. Chest wall and musculoskeletal: Unremarkable: IMPRESSION: 1. Paraspinous cystic lesion overlying the right aspect of T4-T5, which appears to arise from the  pleura or neural foramen and is without associated solid component or contrast enhancement. This is consistent with a definitively benign pleural cyst, neurogenic cyst, or esophageal foregut duplication cyst. No further follow-up or characterization is required. 2. Gross enlargement of the main pulmonary artery, as can be seen in pulmonary hypertension. Electronically Signed   By: Delanna Ahmadi M.D.   On: 10/05/2021 10:00   VAS [place] LOWER EXTREMITY VENOUS (DVT)  Result Date: 10/04/2021  Lower Venous DVT Study Patient Name:  NIMISHA RATHEL  Date of Exam:   10/04/2021 Medical Rec #: [id number]           Accession #:    [id number] Date of Birth: 1965-10-26            Patient Gender: F Patient Age:   26 years Exam Location:  [facility] Procedure:      VAS [place] LOWER EXTREMITY VENOUS (DVT) Referring Phys: CALLIE GOODRICH --------------------------------------------------------------------------------  Indications: Swelling, and Edema.  Limitations: Body habitus and poor ultrasound/tissue interface. Comparison Study: no prior Performing Technologist: Archie Patten RVS  Examination Guidelines: A complete evaluation includes B-mode imaging,  spectral Doppler, color Doppler, and power Doppler as needed of all accessible portions of each vessel. Bilateral testing is considered an integral part of a complete examination. Limited examinations for reoccurring indications may be performed as noted. The reflux portion of the exam is performed with the patient in reverse Trendelenburg.  +-----+---------------+---------+-----------+----------+--------------+  RIGHT Compressibility Phasicity Spontaneity Properties Thrombus Aging  +-----+---------------+---------+-----------+----------+--------------+  CFV   Full            Yes       Yes                                    +-----+---------------+---------+-----------+----------+--------------+   +---------+---------------+---------+-----------+----------+--------------+  LEFT       Compressibility Phasicity Spontaneity Properties Thrombus Aging  +---------+---------------+---------+-----------+----------+--------------+  CFV       Full            Yes       Yes                                    +---------+---------------+---------+-----------+----------+--------------+  SFJ       Full                                                             +---------+---------------+---------+-----------+----------+--------------+  FV Prox   Full                                                             +---------+---------------+---------+-----------+----------+--------------+  FV Mid    Full                                                             +---------+---------------+---------+-----------+----------+--------------+  FV Distal Full                                                             +---------+---------------+---------+-----------+----------+--------------+  PFV       Full                                                             +---------+---------------+---------+-----------+----------+--------------+  POP       Full            Yes       Yes                                    +---------+---------------+---------+-----------+----------+--------------+  PTV       Full                                                             +---------+---------------+---------+-----------+----------+--------------+  PERO      Full                                                             +---------+---------------+---------+-----------+----------+--------------+     Summary: RIGHT: - No evidence of common femoral vein obstruction.  LEFT: - There is no evidence of deep vein thrombosis in the lower extremity.  - No cystic structure found in the popliteal fossa.  *See table(s) above for measurements and observations. Electronically signed by Harold Barban MD on 10/04/2021 at 11:21:27 PM.    Final      Scheduled Meds:  furosemide  40 mg Intravenous Q12H   heparin  5,000 Units Subcutaneous Q8H    irbesartan  37.5 mg Oral Daily   sodium chloride flush  3 mL Intravenous Q12H   spironolactone  12.5 mg Oral Daily   Continuous Infusions:  sodium chloride     sodium chloride 10 mL/hr at 10/06/21 0600     LOS: 1 day    Time spent:49min    Domenic Polite, MD [facility]   10/06/2021, 11:26 AM

## 2021-10-06 NOTE — Progress Notes (Signed)
Patient O2 sats mid 80s on CPAP, 2 L O2 bled in.  Patient now maintaining O2 sat at 100% ?

## 2021-10-06 NOTE — Progress Notes (Signed)
?  Mobility Specialist Criteria Algorithm Info. ? ? ? 10/06/21 1813  ?Mobility  ?Activity Refused mobility  ? ?Patient declined for unspecified reasons. Will check back as time permits. ? ?10/06/2021 ?7:26 PM ? ?Christina Dominguez, CMS, BS EXP ?Acute Rehabilitation Services  ?WPYKD:[phone] ?Office: [phone] ? ?

## 2021-10-06 NOTE — Progress Notes (Signed)
CPAP moved closer per pt request. Pt states she will be able to put on the unit herself and will ask for help if needed. ?

## 2021-10-06 NOTE — H&P (View-Only) (Signed)
Progress Note  Patient Name: Christina Dominguez Date of Encounter: 10/06/2021  [facility] Cardiologist: Shirlee More, MD   Subjective   Has a cough this AM, initially with some yellow sputum, improved with cough syrup. Reviewed plans for cath today, she is amenable.  Inpatient Medications    Scheduled Meds:  furosemide  40 mg Intravenous Q12H   heparin  5,000 Units Subcutaneous Q8H   irbesartan  37.5 mg Oral Daily   potassium chloride  40 mEq Oral BID   sodium chloride flush  3 mL Intravenous Q12H   spironolactone  12.5 mg Oral Daily   Continuous Infusions:  sodium chloride     sodium chloride 10 mL/hr at 10/06/21 0600   PRN Meds: sodium chloride, acetaminophen **OR** acetaminophen, albuterol, guaiFENesin-dextromethorphan, magnesium hydroxide, ondansetron **OR** ondansetron (ZOFRAN) IV, sodium chloride flush, traZODone   Vital Signs    Vitals:   10/05/21 1937 10/05/21 2314 10/06/21 0506 10/06/21 0802  BP: 131/86 133/83 130/82 (!) 113/97  Pulse: 80 87 81 86  Resp: 19 (!) 21 17 (!) 22  Temp: 98.2 F (36.8 C) 97.6 F (36.4 C) 98.5 F (36.9 C) 98.5 F (36.9 C)  TempSrc: Oral Oral Oral Oral  SpO2: 99% 94% 93% 94%  Weight:   (!) 166.7 kg   Height:        Intake/Output Summary (Last 24 hours) at 10/06/2021 1059 Last data filed at 10/06/2021 0600 Gross per 24 hour  Intake 717.12 ml  Output 2650 ml  Net -1932.88 ml   Last 3 Weights 10/06/2021 10/05/2021 10/04/2021  Weight (lbs) 367 lb 8.1 oz 368 lb 2.7 oz 377 lb 10.4 oz  Weight (kg) 166.7 kg 167 kg 171.3 kg      Telemetry    NSR - Personally Reviewed  ECG    No new since 2/28 - Personally Reviewed  Physical Exam   GEN: No acute distress.   Neck: No JVD appreciated but difficult to assess Cardiac: RRR, no murmurs, rubs, or gallops.  Respiratory: Clear to auscultation bilaterally. GI: Soft, nontender, non-distended  MS: No edema; No deformity. Neuro:  Nonfocal  Psych: Normal affect   Labs    High  Sensitivity Troponin:   Recent Labs  Lab 10/03/21 2120 10/03/21 2326  TROPONINIHS 4 3     Chemistry Recent Labs  Lab 10/03/21 2120 10/04/21 0618 10/05/21 0050 10/06/21 0113  NA 135 139 138 133*  K 3.8 3.6 3.4* 3.7  CL 96* 97* 96* 92*  CO2 30 31 31 31   GLUCOSE 92 96 87 114*  BUN 19 14 18 18   CREATININE 0.95 0.94 1.15* 1.10*  CALCIUM 8.7* 8.7* 8.7* 8.8*  PROT 7.8  --   --   --   ALBUMIN 3.8  --   --   --   AST 20  --   --   --   ALT 19  --   --   --   ALKPHOS 117  --   --   --   BILITOT 0.4  --   --   --   GFRNONAA >60 >60 56* 59*  ANIONGAP 9 11 11 10     Lipids No results for input(s): CHOL, TRIG, HDL, LABVLDL, LDLCALC, CHOLHDL in the last 168 hours.  Hematology Recent Labs  Lab 10/03/21 2120 10/04/21 0618 10/05/21 0050  WBC 10.7* 7.9 9.0  RBC 3.94 3.82* 3.96  HGB 11.7* 11.0* 11.5*  HCT 36.4 35.1* 36.4  MCV 92.4 91.9 91.9  MCH 29.7 28.8  29.0  MCHC 32.1 31.3 31.6  RDW 15.0 14.9 14.7  PLT 352 281 329   Thyroid No results for input(s): TSH, FREET4 in the last 168 hours.  BNP Recent Labs  Lab 10/03/21 2120  BNP 46.0    DDimer No results for input(s): DDIMER in the last 168 hours.   Radiology    MR CHEST W WO CONTRAST  Result Date: 10/05/2021 CLINICAL DATA:  Paraspinous mass incidentally identified by prior CT EXAM: MRI CHEST WITHOUT AND WITH CONTRAST TECHNIQUE: Multisequence, multiplanar MR examination of the chest was performed both prior to and following the uncomplicated intravenous administration of gadolinium contrast. CONTRAST:  69mL GADAVIST GADOBUTROL 1 MMOL/ML IV SOLN COMPARISON:  CT chest angiogram, 10/03/2021 FINDINGS: Cardiovascular: Normal heart size. Gross enlargement of the main pulmonary artery, at least 4.3 cm in caliber. Mediastinum: No lymphadenopathy. There is a paraspinous oval, fluid signal cystic lesion overlying the right aspect of T4-T5, which appears to arise from the pleura or neural foramen and is without associated solid component or  contrast enhancement (series 9, image 13). Limited lungs: Unremarkable. Upper abdomen: Unremarkable. Chest wall and musculoskeletal: Unremarkable: IMPRESSION: 1. Paraspinous cystic lesion overlying the right aspect of T4-T5, which appears to arise from the pleura or neural foramen and is without associated solid component or contrast enhancement. This is consistent with a definitively benign pleural cyst, neurogenic cyst, or esophageal foregut duplication cyst. No further follow-up or characterization is required. 2. Gross enlargement of the main pulmonary artery, as can be seen in pulmonary hypertension. Electronically Signed   By: Delanna Ahmadi M.D.   On: 10/05/2021 10:00   VAS [place] LOWER EXTREMITY VENOUS (DVT)  Result Date: 10/04/2021  Lower Venous DVT Study Patient Name:  Christina Dominguez  Date of Exam:   10/04/2021 Medical Rec #: [id number]           Accession #:    [id number] Date of Birth: 1965-08-09            Patient Gender: F Patient Age:   16 years Exam Location:  [facility] Procedure:      VAS [place] LOWER EXTREMITY VENOUS (DVT) Referring Phys: CALLIE GOODRICH --------------------------------------------------------------------------------  Indications: Swelling, and Edema.  Limitations: Body habitus and poor ultrasound/tissue interface. Comparison Study: no prior Performing Technologist: Archie Patten RVS  Examination Guidelines: A complete evaluation includes B-mode imaging, spectral Doppler, color Doppler, and power Doppler as needed of all accessible portions of each vessel. Bilateral testing is considered an integral part of a complete examination. Limited examinations for reoccurring indications may be performed as noted. The reflux portion of the exam is performed with the patient in reverse Trendelenburg.  +-----+---------------+---------+-----------+----------+--------------+  RIGHT Compressibility Phasicity Spontaneity Properties Thrombus Aging   +-----+---------------+---------+-----------+----------+--------------+  CFV   Full            Yes       Yes                                    +-----+---------------+---------+-----------+----------+--------------+   +---------+---------------+---------+-----------+----------+--------------+  LEFT      Compressibility Phasicity Spontaneity Properties Thrombus Aging  +---------+---------------+---------+-----------+----------+--------------+  CFV       Full            Yes       Yes                                    +---------+---------------+---------+-----------+----------+--------------+  SFJ       Full                                                             +---------+---------------+---------+-----------+----------+--------------+  FV Prox   Full                                                             +---------+---------------+---------+-----------+----------+--------------+  FV Mid    Full                                                             +---------+---------------+---------+-----------+----------+--------------+  FV Distal Full                                                             +---------+---------------+---------+-----------+----------+--------------+  PFV       Full                                                             +---------+---------------+---------+-----------+----------+--------------+  POP       Full            Yes       Yes                                    +---------+---------------+---------+-----------+----------+--------------+  PTV       Full                                                             +---------+---------------+---------+-----------+----------+--------------+  PERO      Full                                                             +---------+---------------+---------+-----------+----------+--------------+     Summary: RIGHT: - No evidence of common femoral vein obstruction.  LEFT: - There is no evidence of deep vein thrombosis in the  lower extremity.  - No cystic structure found in the popliteal fossa.  *See table(s) above for measurements and observations. Electronically signed by Harold Barban MD on 10/04/2021 at 11:21:27  PM.    Final     Cardiac Studies   ECHO: 08/20/2021  1. Left ventricular ejection fraction, by estimation, is 55 to 60%. The  left ventricle has normal function. The left ventricle has no regional  wall motion abnormalities. There is mild concentric left ventricular  hypertrophy. Left ventricular diastolic parameters are consistent with Grade I diastolic dysfunction (impaired relaxation).   2. Right ventricular systolic function is normal. The right ventricular  size is normal. Tricuspid regurgitation signal is inadequate for assessing  PA pressure.   3. The mitral valve is normal in structure. No evidence of mitral valve  regurgitation. No evidence of mitral stenosis.   4. The aortic valve was not well visualized. Aortic valve regurgitation  is not visualized. No aortic stenosis is present.   5. The inferior vena cava is normal in size with <50% respiratory  variability, suggesting right atrial pressure of 8 mmHg.   Patient Profile     56 y.o. female with a history of chronic diastolic CHF with chronic lower extremity edema, hypertension, prediabetes, mild asthma, anxiety/depression, bipolar disorder and morbid obesity, was admitted 03/01 with CHF exacerbation.  Assessment & Plan    Acute on chronic diastolic heart failure Chest pain -pending R/LHC today for definitive evaluation -continue spironolatoctone, ARB -could consider SLGT2i -further diuresis based on RHC results today  Hypertension: well controlled, continue current regimen  Morbid obesity: BMI 71, working towards bariatric surgery, cath will also be helpful for preop.  For questions or updates, please contact [place] Please consult [email or web address] for contact info under     Signed, Buford Dresser, MD  10/06/2021,  10:59 AM

## 2021-10-06 NOTE — Progress Notes (Signed)
Progress Note  Patient Name: Christina Dominguez Date of Encounter: 10/06/2021  [facility] Cardiologist: Shirlee More, MD   Subjective   Has a cough this AM, initially with some yellow sputum, improved with cough syrup. Reviewed plans for cath today, she is amenable.  Inpatient Medications    Scheduled Meds:  furosemide  40 mg Intravenous Q12H   heparin  5,000 Units Subcutaneous Q8H   irbesartan  37.5 mg Oral Daily   potassium chloride  40 mEq Oral BID   sodium chloride flush  3 mL Intravenous Q12H   spironolactone  12.5 mg Oral Daily   Continuous Infusions:  sodium chloride     sodium chloride 10 mL/hr at 10/06/21 0600   PRN Meds: sodium chloride, acetaminophen **OR** acetaminophen, albuterol, guaiFENesin-dextromethorphan, magnesium hydroxide, ondansetron **OR** ondansetron (ZOFRAN) IV, sodium chloride flush, traZODone   Vital Signs    Vitals:   10/05/21 1937 10/05/21 2314 10/06/21 0506 10/06/21 0802  BP: 131/86 133/83 130/82 (!) 113/97  Pulse: 80 87 81 86  Resp: 19 (!) 21 17 (!) 22  Temp: 98.2 F (36.8 C) 97.6 F (36.4 C) 98.5 F (36.9 C) 98.5 F (36.9 C)  TempSrc: Oral Oral Oral Oral  SpO2: 99% 94% 93% 94%  Weight:   (!) 166.7 kg   Height:        Intake/Output Summary (Last 24 hours) at 10/06/2021 1059 Last data filed at 10/06/2021 0600 Gross per 24 hour  Intake 717.12 ml  Output 2650 ml  Net -1932.88 ml   Last 3 Weights 10/06/2021 10/05/2021 10/04/2021  Weight (lbs) 367 lb 8.1 oz 368 lb 2.7 oz 377 lb 10.4 oz  Weight (kg) 166.7 kg 167 kg 171.3 kg      Telemetry    NSR - Personally Reviewed  ECG    No new since 2/28 - Personally Reviewed  Physical Exam   GEN: No acute distress.   Neck: No JVD appreciated but difficult to assess Cardiac: RRR, no murmurs, rubs, or gallops.  Respiratory: Clear to auscultation bilaterally. GI: Soft, nontender, non-distended  MS: No edema; No deformity. Neuro:  Nonfocal  Psych: Normal affect   Labs    High  Sensitivity Troponin:   Recent Labs  Lab 10/03/21 2120 10/03/21 2326  TROPONINIHS 4 3     Chemistry Recent Labs  Lab 10/03/21 2120 10/04/21 0618 10/05/21 0050 10/06/21 0113  NA 135 139 138 133*  K 3.8 3.6 3.4* 3.7  CL 96* 97* 96* 92*  CO2 30 31 31 31   GLUCOSE 92 96 87 114*  BUN 19 14 18 18   CREATININE 0.95 0.94 1.15* 1.10*  CALCIUM 8.7* 8.7* 8.7* 8.8*  PROT 7.8  --   --   --   ALBUMIN 3.8  --   --   --   AST 20  --   --   --   ALT 19  --   --   --   ALKPHOS 117  --   --   --   BILITOT 0.4  --   --   --   GFRNONAA >60 >60 56* 59*  ANIONGAP 9 11 11 10     Lipids No results for input(s): CHOL, TRIG, HDL, LABVLDL, LDLCALC, CHOLHDL in the last 168 hours.  Hematology Recent Labs  Lab 10/03/21 2120 10/04/21 0618 10/05/21 0050  WBC 10.7* 7.9 9.0  RBC 3.94 3.82* 3.96  HGB 11.7* 11.0* 11.5*  HCT 36.4 35.1* 36.4  MCV 92.4 91.9 91.9  MCH 29.7 28.8  29.0  MCHC 32.1 31.3 31.6  RDW 15.0 14.9 14.7  PLT 352 281 329   Thyroid No results for input(s): TSH, FREET4 in the last 168 hours.  BNP Recent Labs  Lab 10/03/21 2120  BNP 46.0    DDimer No results for input(s): DDIMER in the last 168 hours.   Radiology    MR CHEST W WO CONTRAST  Result Date: 10/05/2021 CLINICAL DATA:  Paraspinous mass incidentally identified by prior CT EXAM: MRI CHEST WITHOUT AND WITH CONTRAST TECHNIQUE: Multisequence, multiplanar MR examination of the chest was performed both prior to and following the uncomplicated intravenous administration of gadolinium contrast. CONTRAST:  95mL GADAVIST GADOBUTROL 1 MMOL/ML IV SOLN COMPARISON:  CT chest angiogram, 10/03/2021 FINDINGS: Cardiovascular: Normal heart size. Gross enlargement of the main pulmonary artery, at least 4.3 cm in caliber. Mediastinum: No lymphadenopathy. There is a paraspinous oval, fluid signal cystic lesion overlying the right aspect of T4-T5, which appears to arise from the pleura or neural foramen and is without associated solid component or  contrast enhancement (series 9, image 13). Limited lungs: Unremarkable. Upper abdomen: Unremarkable. Chest wall and musculoskeletal: Unremarkable: IMPRESSION: 1. Paraspinous cystic lesion overlying the right aspect of T4-T5, which appears to arise from the pleura or neural foramen and is without associated solid component or contrast enhancement. This is consistent with a definitively benign pleural cyst, neurogenic cyst, or esophageal foregut duplication cyst. No further follow-up or characterization is required. 2. Gross enlargement of the main pulmonary artery, as can be seen in pulmonary hypertension. Electronically Signed   By: Delanna Ahmadi M.D.   On: 10/05/2021 10:00   VAS [place] LOWER EXTREMITY VENOUS (DVT)  Result Date: 10/04/2021  Lower Venous DVT Study Patient Name:  Christina Dominguez  Date of Exam:   10/04/2021 Medical Rec #: [id number]           Accession #:    [id number] Date of Birth: 23-Feb-1966            Patient Gender: F Patient Age:   56 years Exam Location:  [facility] Procedure:      VAS [place] LOWER EXTREMITY VENOUS (DVT) Referring Phys: CALLIE GOODRICH --------------------------------------------------------------------------------  Indications: Swelling, and Edema.  Limitations: Body habitus and poor ultrasound/tissue interface. Comparison Study: no prior Performing Technologist: Archie Patten RVS  Examination Guidelines: A complete evaluation includes B-mode imaging, spectral Doppler, color Doppler, and power Doppler as needed of all accessible portions of each vessel. Bilateral testing is considered an integral part of a complete examination. Limited examinations for reoccurring indications may be performed as noted. The reflux portion of the exam is performed with the patient in reverse Trendelenburg.  +-----+---------------+---------+-----------+----------+--------------+  RIGHT Compressibility Phasicity Spontaneity Properties Thrombus Aging   +-----+---------------+---------+-----------+----------+--------------+  CFV   Full            Yes       Yes                                    +-----+---------------+---------+-----------+----------+--------------+   +---------+---------------+---------+-----------+----------+--------------+  LEFT      Compressibility Phasicity Spontaneity Properties Thrombus Aging  +---------+---------------+---------+-----------+----------+--------------+  CFV       Full            Yes       Yes                                    +---------+---------------+---------+-----------+----------+--------------+  SFJ       Full                                                             +---------+---------------+---------+-----------+----------+--------------+  FV Prox   Full                                                             +---------+---------------+---------+-----------+----------+--------------+  FV Mid    Full                                                             +---------+---------------+---------+-----------+----------+--------------+  FV Distal Full                                                             +---------+---------------+---------+-----------+----------+--------------+  PFV       Full                                                             +---------+---------------+---------+-----------+----------+--------------+  POP       Full            Yes       Yes                                    +---------+---------------+---------+-----------+----------+--------------+  PTV       Full                                                             +---------+---------------+---------+-----------+----------+--------------+  PERO      Full                                                             +---------+---------------+---------+-----------+----------+--------------+     Summary: RIGHT: - No evidence of common femoral vein obstruction.  LEFT: - There is no evidence of deep vein thrombosis in the  lower extremity.  - No cystic structure found in the popliteal fossa.  *See table(s) above for measurements and observations. Electronically signed by Harold Barban MD on 10/04/2021 at 11:21:27  PM.    Final     Cardiac Studies   ECHO: 08/20/2021  1. Left ventricular ejection fraction, by estimation, is 55 to 60%. The  left ventricle has normal function. The left ventricle has no regional  wall motion abnormalities. There is mild concentric left ventricular  hypertrophy. Left ventricular diastolic parameters are consistent with Grade I diastolic dysfunction (impaired relaxation).   2. Right ventricular systolic function is normal. The right ventricular  size is normal. Tricuspid regurgitation signal is inadequate for assessing  PA pressure.   3. The mitral valve is normal in structure. No evidence of mitral valve  regurgitation. No evidence of mitral stenosis.   4. The aortic valve was not well visualized. Aortic valve regurgitation  is not visualized. No aortic stenosis is present.   5. The inferior vena cava is normal in size with <50% respiratory  variability, suggesting right atrial pressure of 8 mmHg.   Patient Profile     56 y.o. female with a history of chronic diastolic CHF with chronic lower extremity edema, hypertension, prediabetes, mild asthma, anxiety/depression, bipolar disorder and morbid obesity, was admitted 03/01 with CHF exacerbation.  Assessment & Plan    Acute on chronic diastolic heart failure Chest pain -pending R/LHC today for definitive evaluation -continue spironolatoctone, ARB -could consider SLGT2i -further diuresis based on RHC results today  Hypertension: well controlled, continue current regimen  Morbid obesity: BMI 71, working towards bariatric surgery, cath will also be helpful for preop.  For questions or updates, please contact [place] Please consult [email or web address] for contact info under     Signed, Buford Dresser, MD  10/06/2021,  10:59 AM

## 2021-10-06 NOTE — Progress Notes (Signed)
TR BAND REMOVAL ?  ?LOCATION:    Right radial  ?  ?DEFLATED PER PROTOCOL:    Yes.   ?  ?TIME BAND OFF / DRESSING APPLIED:    2200 clean, dry dressing applied with gauze and tegaderm  ?  ?SITE UPON ARRIVAL:    Level 0 ?  ?SITE AFTER BAND REMOVAL:    Level 0 ?  ?CIRCULATION SENSATION AND MOVEMENT:    WNL ?  ?COMMENTS:   Care instructions given to patient  ?

## 2021-10-06 NOTE — Plan of Care (Signed)
  Problem: Education: Goal: Ability to demonstrate management of disease process will improve Outcome: Progressing Goal: Ability to verbalize understanding of medication therapies will improve Outcome: Progressing Goal: Individualized Educational Video(s) Outcome: Progressing   Problem: Activity: Goal: Capacity to carry out activities will improve Outcome: Progressing   Problem: Cardiac: Goal: Ability to achieve and maintain adequate cardiopulmonary perfusion will improve Outcome: Progressing   Problem: Education: Goal: Understanding of CV disease, CV risk reduction, and recovery process will improve Outcome: Progressing Goal: Individualized Educational Video(s) Outcome: Progressing   Problem: Activity: Goal: Ability to return to baseline activity level will improve Outcome: Progressing   Problem: Cardiovascular: Goal: Ability to achieve and maintain adequate cardiovascular perfusion will improve Outcome: Progressing Goal: Vascular access site(s) Level 0-1 will be maintained Outcome: Progressing   Problem: Health Behavior/Discharge Planning: Goal: Ability to safely manage health-related needs after discharge will improve Outcome: Progressing   

## 2021-10-07 DIAGNOSIS — I959 Hypotension, unspecified: Secondary | ICD-10-CM | POA: Diagnosis not present

## 2021-10-07 DIAGNOSIS — I5033 Acute on chronic diastolic (congestive) heart failure: Secondary | ICD-10-CM | POA: Diagnosis present

## 2021-10-07 DIAGNOSIS — I110 Hypertensive heart disease with heart failure: Secondary | ICD-10-CM | POA: Diagnosis present

## 2021-10-07 DIAGNOSIS — E662 Morbid (severe) obesity with alveolar hypoventilation: Secondary | ICD-10-CM | POA: Diagnosis present

## 2021-10-07 DIAGNOSIS — I2729 Other secondary pulmonary hypertension: Secondary | ICD-10-CM | POA: Diagnosis present

## 2021-10-07 DIAGNOSIS — J9622 Acute and chronic respiratory failure with hypercapnia: Secondary | ICD-10-CM | POA: Diagnosis present

## 2021-10-07 DIAGNOSIS — E876 Hypokalemia: Secondary | ICD-10-CM | POA: Diagnosis not present

## 2021-10-07 DIAGNOSIS — F319 Bipolar disorder, unspecified: Secondary | ICD-10-CM | POA: Diagnosis present

## 2021-10-07 DIAGNOSIS — Z6841 Body Mass Index (BMI) 40.0 and over, adult: Secondary | ICD-10-CM | POA: Diagnosis not present

## 2021-10-07 DIAGNOSIS — R0601 Orthopnea: Secondary | ICD-10-CM | POA: Diagnosis present

## 2021-10-07 DIAGNOSIS — J9859 Other diseases of mediastinum, not elsewhere classified: Secondary | ICD-10-CM | POA: Diagnosis not present

## 2021-10-07 DIAGNOSIS — Z20822 Contact with and (suspected) exposure to covid-19: Secondary | ICD-10-CM | POA: Diagnosis present

## 2021-10-07 DIAGNOSIS — I5082 Biventricular heart failure: Secondary | ICD-10-CM | POA: Diagnosis present

## 2021-10-07 DIAGNOSIS — R0609 Other forms of dyspnea: Secondary | ICD-10-CM | POA: Diagnosis not present

## 2021-10-07 DIAGNOSIS — J9621 Acute and chronic respiratory failure with hypoxia: Secondary | ICD-10-CM | POA: Diagnosis present

## 2021-10-07 DIAGNOSIS — I700 Atherosclerosis of aorta: Secondary | ICD-10-CM | POA: Diagnosis present

## 2021-10-07 DIAGNOSIS — U071 COVID-19: Secondary | ICD-10-CM | POA: Diagnosis not present

## 2021-10-07 DIAGNOSIS — R222 Localized swelling, mass and lump, trunk: Secondary | ICD-10-CM | POA: Diagnosis present

## 2021-10-07 DIAGNOSIS — Z79899 Other long term (current) drug therapy: Secondary | ICD-10-CM | POA: Diagnosis not present

## 2021-10-07 DIAGNOSIS — E871 Hypo-osmolality and hyponatremia: Secondary | ICD-10-CM | POA: Diagnosis not present

## 2021-10-07 DIAGNOSIS — E785 Hyperlipidemia, unspecified: Secondary | ICD-10-CM | POA: Diagnosis present

## 2021-10-07 DIAGNOSIS — I10 Essential (primary) hypertension: Secondary | ICD-10-CM | POA: Diagnosis not present

## 2021-10-07 DIAGNOSIS — Z886 Allergy status to analgesic agent status: Secondary | ICD-10-CM | POA: Diagnosis not present

## 2021-10-07 DIAGNOSIS — R072 Precordial pain: Secondary | ICD-10-CM | POA: Diagnosis not present

## 2021-10-07 DIAGNOSIS — Z8249 Family history of ischemic heart disease and other diseases of the circulatory system: Secondary | ICD-10-CM | POA: Diagnosis not present

## 2021-10-07 DIAGNOSIS — G4733 Obstructive sleep apnea (adult) (pediatric): Secondary | ICD-10-CM | POA: Diagnosis not present

## 2021-10-07 DIAGNOSIS — F419 Anxiety disorder, unspecified: Secondary | ICD-10-CM | POA: Diagnosis present

## 2021-10-07 DIAGNOSIS — Z91012 Allergy to eggs: Secondary | ICD-10-CM | POA: Diagnosis not present

## 2021-10-07 DIAGNOSIS — J4520 Mild intermittent asthma, uncomplicated: Secondary | ICD-10-CM | POA: Diagnosis present

## 2021-10-07 DIAGNOSIS — Z83438 Family history of other disorder of lipoprotein metabolism and other lipidemia: Secondary | ICD-10-CM | POA: Diagnosis not present

## 2021-10-07 LAB — BASIC METABOLIC PANEL
Anion gap: 10 (ref 5–15)
BUN: 15 mg/dL (ref 6–20)
CO2: 28 mmol/L (ref 22–32)
Calcium: 8.6 mg/dL — ABNORMAL LOW (ref 8.9–10.3)
Chloride: 95 mmol/L — ABNORMAL LOW (ref 98–111)
Creatinine, Ser: 1.08 mg/dL — ABNORMAL HIGH (ref 0.44–1.00)
GFR, Estimated: >60 mL/min (ref >60)
Glucose, Bld: 97 mg/dL (ref 70–99)
Potassium: 3.9 mmol/L (ref 3.5–5.1)
Sodium: 133 mmol/L — ABNORMAL LOW (ref 135–145)

## 2021-10-07 NOTE — Progress Notes (Addendum)
PROGRESS NOTE    Christina Dominguez  WUJ:[id number] DOB: [birth year] DOA: 10/03/2021 PCP: Fredirick Lathe, PA-C  Narrative55/female with morbid obesity, BMI of 73, chronic diastolic CHF, asthma, depression presented to the ED with worsening dyspnea for the past 3 to 4 days, recently discharged over a month ago after hospitalization for same. -In the ED blood pressure was 150/85, BNP 46, WBC 10.7, two-view chest x-ray was unremarkable, limited by body habitus, CT chest noted dilation of pulmonary trunk suggestive of pulmonary hypertension and posterior mediastinal/paraspinal lesion -Improving with diuresis   Subjective: -Feels better overall, breathing better  Assessment and Plan:  Acute on chronic diastolic CHF Patient reported 18 pound weight gain following recent discharge -Has been taking Lasix 40 Mg daily at baseline -Recent echo 1/23 with preserved EF, grade 1 diastolic dysfunction, normal RV function -Clinically improving with diuresis she is 7.5 L negative -Right heart cath with mildly elevated filling pressures, LHC with normal coronaries -Continue IV Lasix and Aldactone, remains on losartan could transition to Entresto at discharge   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, neurogenic cyst, or esophageal foregut duplication cyst, no further work-up indicated   Essential hypertension -Stable on ARB, could transition to [place],   OSA -Due for sleep study in March -Continue auto titrating CPAP for now   Morbid obesity -BMI 73 -Dietitian consult, needs aggressive lifestyle modification -Has recently seen a bariatric surgeon at [facility], supposed to lose 40 pounds prior to gastric sleeve   H/o Asthma -Stable  DVT prophylaxis: Heparin subcutaneous Code Status: Full code Family Communication: No family at bedside Disposition Plan: Home in 1 to 2 days  Consultants:  Cardiology  Procedures: Cardiac cath  10/06/2021       The left ventricular systolic function is normal.   LV end diastolic pressure is moderately elevated.   The left ventricular ejection fraction is 55-65% by visual estimate.   There is no aortic valve stenosis.   No angiographically apparent coronary artery disease.   Hemodynamic findings consistent with moderate pulmonary hypertension.   On 2L/min [place] O2:Ao sat 99%, PA sat 71%, PA 53/30, mean PA pressure 43 mm Hg; mean PCWP 24 mm Hg; CO 7.35 L/min; CI 3.04.   Mild volume overload.  Continue medical therapy  Antimicrobials:    Objective: Vitals:   10/07/21 0200 10/07/21 0313 10/07/21 0600 10/07/21 0821  BP:  130/70  106/76  Pulse:  87  93  Resp:  (!) 22  20  Temp: 98.8 F (37.1 C) 99.3 F (37.4 C)  98.6 F (37 C)  TempSrc: Oral Oral  Oral  SpO2:  93% 98%   Weight:   (!) 164.4 kg   Height:        Intake/Output Summary (Last 24 hours) at 10/07/2021 1017 Last data filed at 10/07/2021 0900 Gross per 24 hour  Intake 657 ml  Output 500 ml  Net 157 ml   Filed Weights   10/05/21 0454 10/06/21 0506 10/07/21 0600  Weight: (!) 167 kg (!) 166.7 kg (!) 164.4 kg    Examination:  General exam: Morbidly obese pleasant female sitting up in bed, AAOx3, no distress HEENT: Neck obese unable to assess JVD CVS: S1-S2, regular rhythm Lungs: Clear bilaterally Abdomen: Soft, nontender bowel sounds present Extremities: No edema  Skin: No rashes Psychiatry: Judgement and insight appear normal. Mood & affect appropriate.     Data Reviewed:   CBC: Recent Labs  Lab 10/03/21  2120 10/04/21 0618 10/05/21 0050 10/06/21 1640 10/06/21 1645  WBC 10.7* 7.9 9.0  --   --   NEUTROABS 7.6  --   --   --   --   HGB 11.7* 11.0* 11.5* 12.6 12.6   12.9  HCT 36.4 35.1* 36.4 37.0 37.0   38.0  MCV 92.4 91.9 91.9  --   --   PLT 352 281 329  --   --    Basic Metabolic Panel: Recent Labs  Lab 10/03/21 2120 10/04/21 0618 10/05/21 0050 10/06/21 0113 10/06/21 1640 10/06/21 1645  10/07/21 0033  NA 135 139 138 133* 137 139   137 133*  K 3.8 3.6 3.4* 3.7 4.2 4.2   4.4 3.9  CL 96* 97* 96* 92*  --   --  95*  CO2 '30 31 31 31  '$ --   --  28  GLUCOSE 92 96 87 114*  --   --  97  BUN '19 14 18 18  '$ --   --  15  CREATININE 0.95 0.94 1.15* 1.10*  --   --  1.08*  CALCIUM 8.7* 8.7* 8.7* 8.8*  --   --  8.6*   GFR: Estimated Creatinine Clearance: 85.5 mL/min (A) (by C-G formula based on SCr of 1.08 mg/dL (H)). Liver Function Tests: Recent Labs  Lab 10/03/21 2120  AST 20  ALT 19  ALKPHOS 117  BILITOT 0.4  PROT 7.8  ALBUMIN 3.8   No results for input(s): LIPASE, AMYLASE in the last 168 hours. No results for input(s): AMMONIA in the last 168 hours. Coagulation Profile: No results for input(s): INR, PROTIME in the last 168 hours. Cardiac Enzymes: No results for input(s): CKTOTAL, CKMB, CKMBINDEX, TROPONINI in the last 168 hours. BNP (last 3 results) Recent Labs    08/09/21 1651 08/25/21 1050 09/26/21 1138  PROBNP 106 38 115   HbA1C: No results for input(s): HGBA1C in the last 72 hours. CBG: Recent Labs  Lab 10/05/21 0614  GLUCAP 126*   Lipid Profile: No results for input(s): CHOL, HDL, LDLCALC, TRIG, CHOLHDL, LDLDIRECT in the last 72 hours. Thyroid Function Tests: No results for input(s): TSH, T4TOTAL, FREET4, T3FREE, THYROIDAB in the last 72 hours. Anemia Panel: No results for input(s): VITAMINB12, FOLATE, FERRITIN, TIBC, IRON, RETICCTPCT in the last 72 hours. Urine analysis:    Component Value Date/Time   COLORURINE YELLOW 08/18/2021 [place] 08/18/2021 1253   LABSPEC 1.010 08/18/2021 1253   PHURINE 5.5 08/18/2021 1253   GLUCOSEU NEGATIVE 08/18/2021 1253   HGBUR TRACE (A) 08/18/2021 1253   BILIRUBINUR NEGATIVE 08/18/2021 1253   KETONESUR NEGATIVE 08/18/2021 1253   PROTEINUR NEGATIVE 08/18/2021 1253   NITRITE NEGATIVE 08/18/2021 1253   LEUKOCYTESUR NEGATIVE 08/18/2021 1253   Sepsis  Labs: '@LABRCNTIP'$ (procalcitonin:4,lacticidven:4)  ) Recent Results (from the past 240 hour(s))  Resp Panel by RT-PCR (Flu A&B, Covid) Nasopharyngeal Swab     Status: None   Collection Time: 10/03/21 11:27 PM   Specimen: Nasopharyngeal Swab; Nasopharyngeal(NP) swabs in vial transport medium  Result Value Ref Range Status   SARS Coronavirus 2 by RT PCR NEGATIVE NEGATIVE Final    Comment: (NOTE) SARS-CoV-2 target nucleic acids are NOT DETECTED.  The SARS-CoV-2 RNA is generally detectable in upper respiratory specimens during the acute phase of infection. The lowest concentration of SARS-CoV-2 viral copies this assay can detect is 138 copies/mL. A negative result does not preclude SARS-Cov-2 infection and should not be used as the sole basis for treatment or  other patient management decisions. A negative result may occur with  improper specimen collection/handling, submission of specimen other than nasopharyngeal swab, presence of viral mutation(s) within the areas targeted by this assay, and inadequate number of viral copies(<138 copies/mL). A negative result must be combined with clinical observations, patient history, and epidemiological information. The expected result is Negative.  Fact Sheet for Patients:  EntrepreneurPulse.com.au  Fact Sheet for Healthcare Providers:  IncredibleEmployment.be  This test is no t yet approved or cleared by the [place] FDA and  has been authorized for detection and/or diagnosis of SARS-CoV-2 by FDA under an Emergency Use Authorization (EUA). This EUA will remain  in effect (meaning this test can be used) for the duration of the COVID-19 declaration under Section 564(b)(1) of the Act, 21 U.S.C.section 360bbb-3(b)(1), unless the authorization is terminated  or revoked sooner.       Influenza A by PCR NEGATIVE NEGATIVE Final   Influenza B by PCR NEGATIVE NEGATIVE Final    Comment: (NOTE) The Xpert  Xpress SARS-CoV-2/FLU/RSV plus assay is intended as an aid in the diagnosis of influenza from Nasopharyngeal swab specimens and should not be used as a sole basis for treatment. Nasal washings and aspirates are unacceptable for Xpert Xpress SARS-CoV-2/FLU/RSV testing.  Fact Sheet for Patients: EntrepreneurPulse.com.au  Fact Sheet for Healthcare Providers: IncredibleEmployment.be  This test is not yet approved or cleared by the [place] FDA and has been authorized for detection and/or diagnosis of SARS-CoV-2 by FDA under an Emergency Use Authorization (EUA). This EUA will remain in effect (meaning this test can be used) for the duration of the COVID-19 declaration under Section 564(b)(1) of the Act, 21 U.S.C. section 360bbb-3(b)(1), unless the authorization is terminated or revoked.  Performed at [facility], [address] [place], [place] [address]   MRSA Next Gen by PCR, Nasal     Status: None   Collection Time: 10/04/21  2:57 AM   Specimen: Nasal Mucosa; Nasal Swab  Result Value Ref Range Status   MRSA by PCR Next Gen NOT DETECTED NOT DETECTED Final    Comment: (NOTE) The GeneXpert MRSA Assay (FDA approved for NASAL specimens only), is one component of a comprehensive MRSA colonization surveillance program. It is not intended to diagnose MRSA infection nor to guide or monitor treatment for MRSA infections. Test performance is not FDA approved in patients less than 20 years old. Performed at [facility], [place] [address] [place], [place] [address]      Radiology Studies: CARDIAC CATHETERIZATION  Result Date: 10/06/2021   The left ventricular systolic function is normal.   LV end diastolic pressure is moderately elevated.   The left ventricular ejection fraction is 55-65% by visual estimate.   There is no aortic valve stenosis.   No angiographically apparent coronary artery disease.   Hemodynamic findings consistent  with moderate pulmonary hypertension.   On 2L/min [place] O2:Ao sat 99%, PA sat 71%, PA 53/30, mean PA pressure 43 mm Hg; mean PCWP 24 mm Hg; CO 7.35 L/min; CI 3.04. Mild volume overload.  Continue medical therapy.     Scheduled Meds:  furosemide  40 mg Intravenous Q12H   heparin  5,000 Units Subcutaneous Q8H   irbesartan  37.5 mg Oral Daily   mouth rinse  15 mL Mouth Rinse BID   sodium chloride flush  3 mL Intravenous Q12H   sodium chloride flush  3 mL Intravenous Q12H   spironolactone  12.5 mg Oral Daily  Continuous Infusions:  sodium chloride       LOS: 1 day    Time spent:82mn    PDomenic Polite MD [facility]   10/07/2021, 10:17 AM

## 2021-10-07 NOTE — Progress Notes (Signed)
? ?Progress Note ? ?Patient Name: Christina Dominguez ?Date of Encounter: 10/07/2021 ? ?[facility] Cardiologist: Shirlee More, MD  ? ?Subjective  ? ?Net diuresis 7.5 liters since admission.  Breathing is gradually improving. ?Filling pressures still high at cath yesterday (PAWP 24 mm Hg), but the PA pressure was disproportionately high (mean PAP 43 mm Hg, TPG 19 mm Hg). ? ?Inpatient Medications  ?  ?Scheduled Meds: ? furosemide  40 mg Intravenous Q12H  ? heparin  5,000 Units Subcutaneous Q8H  ? irbesartan  37.5 mg Oral Daily  ? mouth rinse  15 mL Mouth Rinse BID  ? sodium chloride flush  3 mL Intravenous Q12H  ? sodium chloride flush  3 mL Intravenous Q12H  ? spironolactone  12.5 mg Oral Daily  ? ?Continuous Infusions: ? sodium chloride    ? ?PRN Meds: ?sodium chloride, acetaminophen, albuterol, guaiFENesin-dextromethorphan, magnesium hydroxide, ondansetron (ZOFRAN) IV, ondansetron **OR** [DISCONTINUED] ondansetron (ZOFRAN) IV, sodium chloride flush, traZODone  ? ?Vital Signs  ?  ?Vitals:  ? 10/07/21 0200 10/07/21 0313 10/07/21 0600 10/07/21 0821  ?BP:  130/70  106/76  ?Pulse:  87  93  ?Resp:  (!) 22  20  ?Temp: 98.8 ?F (37.1 ?C) 99.3 ?F (37.4 ?C)  98.6 ?F (37 ?C)  ?TempSrc: Oral Oral  Oral  ?SpO2:  93% 98%   ?Weight:   (!) 164.4 kg   ?Height:      ? ? ?Intake/Output Summary (Last 24 hours) at 10/07/2021 0846 ?Last data filed at 10/07/2021 1610 ?Gross per 24 hour  ?Intake 420 ml  ?Output 1500 ml  ?Net -1080 ml  ? ?Last 3 Weights 10/07/2021 10/06/2021 10/05/2021  ?Weight (lbs) 362 lb 7 oz 367 lb 8.1 oz 368 lb 2.7 oz  ?Weight (kg) 164.4 kg 166.7 kg 167 kg  ?   ? ?Telemetry  ?  ?Normal sinus rhythm- Personally Reviewed ? ?ECG  ?  ?Normal sinus rhythm, generalized low voltage due to obesity - Personally Reviewed ? ?Physical Exam  ?morbid obesity limits physical exam; appears comfortable lying almost fully horizontally ?GEN: No acute distress.   ?Neck: No JVD ?Cardiac: RRR, no murmurs, rubs, or gallops.  ?Respiratory: Clear to  auscultation bilaterally. ?GI: Soft, nontender, non-distended  ?MS: No edema; No deformity. ?Neuro:  Nonfocal  ?Psych: Normal affect  ? ?Labs  ?  ?High Sensitivity Troponin:   ?Recent Labs  ?Lab 10/03/21 ?2120 10/03/21 ?2326  ?TROPONINIHS 4 3  ?   ?Chemistry ?Recent Labs  ?Lab 10/03/21 ?2120 10/04/21 ?0618 10/05/21 ?0050 10/06/21 ?0113 10/06/21 ?1640 10/06/21 ?1645 10/07/21 ?9604  ?NA 135   < > 138 133* 137 139  137 133*  ?K 3.8   < > 3.4* 3.7 4.2 4.2  4.4 3.9  ?CL 96*   < > 96* 92*  --   --  95*  ?CO2 30   < > 31 31  --   --  28  ?GLUCOSE 92   < > 87 114*  --   --  97  ?BUN 19   < > 18 18  --   --  15  ?CREATININE 0.95   < > 1.15* 1.10*  --   --  1.08*  ?CALCIUM 8.7*   < > 8.7* 8.8*  --   --  8.6*  ?PROT 7.8  --   --   --   --   --   --   ?ALBUMIN 3.8  --   --   --   --   --   --   ?  AST 20  --   --   --   --   --   --   ?ALT 19  --   --   --   --   --   --   ?ALKPHOS 117  --   --   --   --   --   --   ?BILITOT 0.4  --   --   --   --   --   --   ?GFRNONAA >60   < > 56* 59*  --   --  >60  ?ANIONGAP 9   < > 11 10  --   --  10  ? < > = values in this interval not displayed.  ?  ?Lipids No results for input(s): CHOL, TRIG, HDL, LABVLDL, LDLCALC, CHOLHDL in the last 168 hours.  ?Hematology ?Recent Labs  ?Lab 10/03/21 ?2120 10/04/21 ?0618 10/05/21 ?0050 10/06/21 ?1640 10/06/21 ?1645  ?WBC 10.7* 7.9 9.0  --   --   ?RBC 3.94 3.82* 3.96  --   --   ?HGB 11.7* 11.0* 11.5* 12.6 12.6  12.9  ?HCT 36.4 35.1* 36.4 37.0 37.0  38.0  ?MCV 92.4 91.9 91.9  --   --   ?MCH 29.7 28.8 29.0  --   --   ?MCHC 32.1 31.3 31.6  --   --   ?RDW 15.0 14.9 14.7  --   --   ?PLT 352 281 329  --   --   ? ?Thyroid No results for input(s): TSH, FREET4 in the last 168 hours.  ?BNP ?Recent Labs  ?Lab 10/03/21 ?2120  ?BNP 46.0  ?  ?DDimer No results for input(s): DDIMER in the last 168 hours.  ? ?Radiology  ?  ?CARDIAC CATHETERIZATION ? ?Result Date: 10/06/2021 ?  The left ventricular systolic function is normal.   LV end diastolic pressure is moderately  elevated.   The left ventricular ejection fraction is 55-65% by visual estimate.   There is no aortic valve stenosis.   No angiographically apparent coronary artery disease.   Hemodynamic findings consistent with moderate pulmonary hypertension.   On 2L/min [place] O2:Ao sat 99%, PA sat 71%, PA 53/30, mean PA pressure 43 mm Hg; mean PCWP 24 mm Hg; CO 7.35 L/min; CI 3.04. Mild volume overload.  Continue medical therapy.   ? ?Cardiac Studies  ? ?ECHO: 08/20/2021 ? 1. Left ventricular ejection fraction, by estimation, is 55 to 60%. The  ?left ventricle has normal function. The left ventricle has no regional  ?wall motion abnormalities. There is mild concentric left ventricular  ?hypertrophy. Left ventricular diastolic parameters are consistent with Grade I diastolic dysfunction (impaired relaxation).  ? 2. Right ventricular systolic function is normal. The right ventricular  ?size is normal. Tricuspid regurgitation signal is inadequate for assessing PA pressure.  ? 3. The mitral valve is normal in structure. No evidence of mitral valve regurgitation. No evidence of mitral stenosis.  ? 4. The aortic valve was not well visualized. Aortic valve regurgitation is not visualized. No aortic stenosis is present.  ? 5. The inferior vena cava is normal in size with <50% respiratory  ?variability, suggesting right atrial pressure of 8 mmHg.  ? ?Cardiac cath 10/06/2021 ? ?  ?  The left ventricular systolic function is normal. ?  LV end diastolic pressure is moderately elevated. ?  The left ventricular ejection fraction is 55-65% by visual estimate. ?  There is no aortic valve stenosis. ?  No angiographically apparent coronary  artery disease. ?  Hemodynamic findings consistent with moderate pulmonary hypertension. ?  On 2L/min [place] O2:Ao sat 99%, PA sat 71%, PA 53/30, mean PA pressure 43 mm Hg; mean PCWP 24 mm Hg; CO 7.35 L/min; CI 3.04. ?  ?Mild volume overload.  Continue medical therapy.  ? ?Patient Profile  ?   ?56 y.o. female with a  history of chronic diastolic CHF with chronic lower extremity edema, hypertension, prediabetes, mild asthma, anxiety/depression, bipolar disorder and morbid obesity, was admitted 03/01 with CHF exacerbation. ? ?Assessment & Plan  ?  ?CHF: still hypervolemic by cath yesterday. Continue diuretics. Normal LVEF and normal coronaries at cath.  She will continue to have evidence of right heart failure and peripheral edema even when we have achieved optimum left heart filling parameters. ?PAH: Filling pressures still high at cath yesterday (PAWP 24 mm Hg), but the PA pressure was disproportionately high (mean PAP 43 mm Hg, TPG 19 mm Hg), consistent with a mixed etiology of the PAH (suspect important contribution of OSA/Obesity-hypoventilation sd.).  ?OSA/obesity hypoventilation syndrome: Had evidence of both acute respiratory acidosis but also chronic respiratory acidosis with metabolic compensation on blood gases performed at cardiac catheterization yesterday. ?Acute on chronic hypercapnic respiratory failure: Due to obesity hypoventilation syndrome and superimposed heart failure. ?Super superobesity ?   ? ?For questions or updates, please contact [place] ?Please consult [email or web address] for contact info under  ? ?  ?   ?Signed, ?Sanda Klein, MD  ?10/07/2021, 8:46 AM   ? ?

## 2021-10-07 NOTE — Plan of Care (Signed)
°  Problem: Education: °Goal: Ability to demonstrate management of disease process will improve °Outcome: Progressing °Goal: Ability to verbalize understanding of medication therapies will improve °Outcome: Progressing °Goal: Individualized Educational Video(s) °Outcome: Progressing °  °

## 2021-10-08 DIAGNOSIS — I10 Essential (primary) hypertension: Secondary | ICD-10-CM | POA: Diagnosis not present

## 2021-10-08 DIAGNOSIS — J069 Acute upper respiratory infection, unspecified: Secondary | ICD-10-CM | POA: Insufficient documentation

## 2021-10-08 DIAGNOSIS — I5033 Acute on chronic diastolic (congestive) heart failure: Secondary | ICD-10-CM | POA: Diagnosis not present

## 2021-10-08 LAB — BASIC METABOLIC PANEL
Anion gap: 9 (ref 5–15)
BUN: 21 mg/dL — ABNORMAL HIGH (ref 6–20)
CO2: 32 mmol/L (ref 22–32)
Calcium: 8.3 mg/dL — ABNORMAL LOW (ref 8.9–10.3)
Chloride: 92 mmol/L — ABNORMAL LOW (ref 98–111)
Creatinine, Ser: 1.1 mg/dL — ABNORMAL HIGH (ref 0.44–1.00)
GFR, Estimated: 59 mL/min — ABNORMAL LOW (ref >60)
Glucose, Bld: 99 mg/dL (ref 70–99)
Potassium: 3.9 mmol/L (ref 3.5–5.1)
Sodium: 133 mmol/L — ABNORMAL LOW (ref 135–145)

## 2021-10-08 LAB — RESP PANEL BY RT-PCR (FLU A&B, COVID) ARPGX2
Influenza A by PCR: NEGATIVE
Influenza B by PCR: NEGATIVE
SARS Coronavirus 2 by RT PCR: POSITIVE — AB

## 2021-10-08 MED ORDER — FUROSEMIDE 10 MG/ML IJ SOLN
80.0000 mg | Freq: Two times a day (BID) | INTRAMUSCULAR | Status: DC
  Filled 2021-10-08: qty 8

## 2021-10-08 MED ORDER — SODIUM CHLORIDE 0.9 % IV BOLUS
500.0000 mL | Freq: Once | INTRAVENOUS | Status: AC
  Administered 2021-10-08: 500 mL via INTRAVENOUS

## 2021-10-08 NOTE — Progress Notes (Signed)
Mobility Specialist Progress Note: ? ? 10/08/21 0930  ?Mobility  ?Activity Ambulated with assistance in hallway  ?Level of Assistance Standby assist, set-up cues, supervision of patient - no hands on  ?Assistive Device Front wheel walker  ?Distance Ambulated (ft) 420 ft  ?Activity Response Tolerated well  ?$Mobility charge 1 Mobility  ? ?Pt eager for mobility session this am. Required no physical assist throughout, multiple standing rest breaks d/t fatigue. SpO2 88-95% on RA during session. Pt back in chair with all needs met.  ? ?[facility] Numbers ?Acute Rehab ?Phone: [phone] ?Office Phone: [phone] ? ?

## 2021-10-08 NOTE — Plan of Care (Signed)

## 2021-10-08 NOTE — Progress Notes (Signed)
? ?Progress Note ? ?Patient Name: Christina Dominguez ?Date of Encounter: 10/08/2021 ? ?[facility] Cardiologist: Shirlee More, MD  ? ?Subjective  ? ?Feeling better, but has upper respiratory congestion and some productive cough. ?Additional 2500 mL urine output yesterday, but p.o. intake was almost 2 L, so net negative only about 600 mL. ?Very slight increase in BUN, creatinine unchanged.  Potassium normal. ?Weight essentially unchanged for the last 3 days. ? ? ?Inpatient Medications  ?  ?Scheduled Meds: ? furosemide  40 mg Intravenous Q12H  ? heparin  5,000 Units Subcutaneous Q8H  ? irbesartan  37.5 mg Oral Daily  ? mouth rinse  15 mL Mouth Rinse BID  ? sodium chloride flush  3 mL Intravenous Q12H  ? sodium chloride flush  3 mL Intravenous Q12H  ? spironolactone  12.5 mg Oral Daily  ? ?Continuous Infusions: ? sodium chloride    ? ?PRN Meds: ?sodium chloride, acetaminophen, albuterol, guaiFENesin-dextromethorphan, magnesium hydroxide, ondansetron (ZOFRAN) IV, ondansetron **OR** [DISCONTINUED] ondansetron (ZOFRAN) IV, sodium chloride flush, traZODone  ? ?Vital Signs  ?  ?Vitals:  ? 10/07/21 2348 10/08/21 0320 10/08/21 0102 10/08/21 7253  ?BP: 131/77 110/82    ?Pulse: 73 76    ?Resp: 20 17    ?Temp: 98.6 ?F (37 ?C) 98.9 ?F (37.2 ?C)    ?TempSrc: Oral Oral    ?SpO2: 100% 99%  96%  ?Weight:   (!) 167 kg   ?Height:      ? ? ?Intake/Output Summary (Last 24 hours) at 10/08/2021 0759 ?Last data filed at 10/08/2021 6644 ?Gross per 24 hour  ?Intake 1917 ml  ?Output 2500 ml  ?Net -583 ml  ? ?Last 3 Weights 10/08/2021 10/07/2021 10/06/2021  ?Weight (lbs) 368 lb 2.7 oz 362 lb 7 oz 367 lb 8.1 oz  ?Weight (kg) 167 kg 164.4 kg 166.7 kg  ?   ? ?Telemetry  ?  ?Sinus rhythm- Personally Reviewed ? ?ECG  ?  ?No new tracings- Personally Reviewed ? ?Physical Exam  ?Super superobese ?GEN: No acute distress.   ?Neck: Unable to see JVD ?Cardiac: RRR, no murmurs, rubs, or gallops.  ?Respiratory: Clear to auscultation bilaterally. ?GI: Soft,  nontender, non-distended  ?MS: Chronic appearing edema of the lower extremities; No deformity. ?Neuro:  Nonfocal  ?Psych: Normal affect  ? ?Labs  ?  ?High Sensitivity Troponin:   ?Recent Labs  ?Lab 10/03/21 ?2120 10/03/21 ?2326  ?TROPONINIHS 4 3  ?   ?Chemistry ?Recent Labs  ?Lab 10/03/21 ?2120 10/04/21 ?0618 10/06/21 ?0113 10/06/21 ?1640 10/06/21 ?1645 10/07/21 ?0347 10/08/21 ?0052  ?NA 135   < > 133*   < > 139  137 133* 133*  ?K 3.8   < > 3.7   < > 4.2  4.4 3.9 3.9  ?CL 96*   < > 92*  --   --  95* 92*  ?CO2 30   < > 31  --   --  28 32  ?GLUCOSE 92   < > 114*  --   --  97 99  ?BUN 19   < > 18  --   --  15 21*  ?CREATININE 0.95   < > 1.10*  --   --  1.08* 1.10*  ?CALCIUM 8.7*   < > 8.8*  --   --  8.6* 8.3*  ?PROT 7.8  --   --   --   --   --   --   ?ALBUMIN 3.8  --   --   --   --   --   --   ?  AST 20  --   --   --   --   --   --   ?ALT 19  --   --   --   --   --   --   ?ALKPHOS 117  --   --   --   --   --   --   ?BILITOT 0.4  --   --   --   --   --   --   ?GFRNONAA >60   < > 59*  --   --  >60 59*  ?ANIONGAP 9   < > 10  --   --  10 9  ? < > = values in this interval not displayed.  ?  ?Lipids No results for input(s): CHOL, TRIG, HDL, LABVLDL, LDLCALC, CHOLHDL in the last 168 hours.  ?Hematology ?Recent Labs  ?Lab 10/03/21 ?2120 10/04/21 ?0618 10/05/21 ?0050 10/06/21 ?1640 10/06/21 ?1645  ?WBC 10.7* 7.9 9.0  --   --   ?RBC 3.94 3.82* 3.96  --   --   ?HGB 11.7* 11.0* 11.5* 12.6 12.6  12.9  ?HCT 36.4 35.1* 36.4 37.0 37.0  38.0  ?MCV 92.4 91.9 91.9  --   --   ?MCH 29.7 28.8 29.0  --   --   ?MCHC 32.1 31.3 31.6  --   --   ?RDW 15.0 14.9 14.7  --   --   ?PLT 352 281 329  --   --   ? ?Thyroid No results for input(s): TSH, FREET4 in the last 168 hours.  ?BNP ?Recent Labs  ?Lab 10/03/21 ?2120  ?BNP 46.0  ?  ?DDimer No results for input(s): DDIMER in the last 168 hours.  ? ?Radiology  ?  ?CARDIAC CATHETERIZATION ? ?Result Date: 10/06/2021 ?  The left ventricular systolic function is normal.   LV end diastolic pressure is  moderately elevated.   The left ventricular ejection fraction is 55-65% by visual estimate.   There is no aortic valve stenosis.   No angiographically apparent coronary artery disease.   Hemodynamic findings consistent with moderate pulmonary hypertension.   On 2L/min [place] O2:Ao sat 99%, PA sat 71%, PA 53/30, mean PA pressure 43 mm Hg; mean PCWP 24 mm Hg; CO 7.35 L/min; CI 3.04. Mild volume overload.  Continue medical therapy.   ? ?Cardiac Studies  ? ?ECHO: 08/20/2021 ? 1. Left ventricular ejection fraction, by estimation, is 55 to 60%. The  ?left ventricle has normal function. The left ventricle has no regional  ?wall motion abnormalities. There is mild concentric left ventricular  ?hypertrophy. Left ventricular diastolic parameters are consistent with Grade I diastolic dysfunction (impaired relaxation).  ? 2. Right ventricular systolic function is normal. The right ventricular  ?size is normal. Tricuspid regurgitation signal is inadequate for assessing PA pressure.  ? 3. The mitral valve is normal in structure. No evidence of mitral valve regurgitation. No evidence of mitral stenosis.  ? 4. The aortic valve was not well visualized. Aortic valve regurgitation is not visualized. No aortic stenosis is present.  ? 5. The inferior vena cava is normal in size with <50% respiratory  ?variability, suggesting right atrial pressure of 8 mmHg.  ?  ?Cardiac cath 10/06/2021 ?  ?  ?  The left ventricular systolic function is normal. ?  LV end diastolic pressure is moderately elevated. ?  The left ventricular ejection fraction is 55-65% by visual estimate. ?  There is no aortic valve stenosis. ?  No angiographically  apparent coronary artery disease. ?  Hemodynamic findings consistent with moderate pulmonary hypertension. ?  On 2L/min [place] O2:Ao sat 99%, PA sat 71%, PA 53/30, mean PA pressure 43 mm Hg; mean PCWP 24 mm Hg; CO 7.35 L/min; CI 3.04. ?  ?Mild volume overload.  Continue medical therapy.  ? ?Patient Profile  ?   ?56 y.o.  female with a history of chronic diastolic CHF with chronic lower extremity edema, hypertension, prediabetes, mild asthma, anxiety/depression, bipolar disorder and morbid obesity, was admitted 03/01 with CHF exacerbation. ? ?Assessment & Plan  ?  ?CHF: still hypervolemic by cath on 10/06/2021.  Normal LVEF and normal coronaries at cath.  We will increase the dose of diuretic today.  Anticipate transition to p.o. diuretics tomorrow and possible discharge. She will continue to have evidence of right heart failure and peripheral edema even when we have achieved optimum left heart filling parameters. ?PAH: Filling pressures still high at cath (PAWP 24 mm Hg), but the PA pressure was disproportionately high (mean PAP 43 mm Hg, TPG 19 mm Hg), consistent with a mixed etiology of the PAH (suspect important contribution of OSA/Obesity-hypoventilation sd.).  ?OSA/obesity hypoventilation syndrome: Had evidence of both acute respiratory acidosis but also chronic respiratory acidosis with metabolic compensation on blood gases performed at cardiac catheterization yesterday. ?Acute on chronic hypercapnic respiratory failure: Due to obesity hypoventilation syndrome and superimposed heart failure. ?Super superobesity ? ?   ? ?For questions or updates, please contact [place] ?Please consult [email or web address] for contact info under  ? ?  ?   ?Signed, ?Sanda Klein, MD  ?10/08/2021, 7:59 AM   ? ?

## 2021-10-08 NOTE — Assessment & Plan Note (Deleted)
Patient reports cough and body aches for 1 to 2 days, will check flu and COVID PCR ?

## 2021-10-08 NOTE — Progress Notes (Signed)
Pt reported feeling dizzy while sitting in the chair and unable to ambulate back to the bed.  BP sitting in chair was 77/47. Staff safely assisted patient back to bed. BP recheck was 82/57 with map 67. MD notified & bolus was ordered. ?

## 2021-10-08 NOTE — Plan of Care (Signed)
?  Problem: Activity: ?Goal: Capacity to carry out activities will improve ?Outcome: Not Progressing ?  ?Problem: Activity: ?Goal: Ability to return to baseline activity level will improve ?Outcome: Not Progressing ?  ?

## 2021-10-08 NOTE — Progress Notes (Signed)
PROGRESS NOTE    Christina Dominguez  UXL:[id number] DOB: November 22, 1965 DOA: 10/03/2021 PCP: Christina Lathe, PA-C  Brief Narrative:Narrative56/female with morbid obesity, BMI of 73, chronic diastolic CHF, asthma, depression presented to the ED with worsening dyspnea for the past 3 to 4 days, recently discharged over a month ago after hospitalization for same. -In the ED blood pressure was 150/85, BNP 46, WBC 10.7, two-view chest x-ray was unremarkable, limited by body habitus, CT chest noted dilation of pulmonary trunk suggestive of pulmonary hypertension and posterior mediastinal/paraspinal lesion -Improving with diuresis   Subjective: -Complains of new cough and body aches since yesterday, no events overnight, breathing continues to improve, wondering when she can go home, asking for home O2   Assessment and Plan:  * Acute on chronic diastolic (congestive) heart failure (HCC) -18 pound weight gain following recent discharge -Has been taking Lasix 40 Mg daily at baseline -Recent echo 1/23 with preserved EF, grade 1 diastolic dysfunction, normal RV function -Clinically improving with diuresis, she is 8.1 L negative -Right heart cath with mildly elevated filling pressures, LHC with normal coronaries -Continue IV Lasix and Aldactone, remains on losartan could transition to Entresto at discharge  Mediastinal mass 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, neurogenic cyst, or esophageal foregut duplication cyst, no further work-up indicated  Obstructive sleep apnea - Due for sleep study in March, will need home O2 set up nightly  Class 3 severe obesity with serious comorbidity and body mass index (BMI) greater than or equal to 70 in adult [facility]) BMI 73 -Dietitian consult, needs aggressive lifestyle modification -Has recently seen a bariatric surgeon at [facility], supposed to lose 40 pounds prior to gastric sleeve  URI (upper  respiratory infection) Patient reports cough and body aches for 1 to 2 days, will check flu and COVID PCR  Dyslipidemia - We will continue statin therapy.  Mild intermittent asthma without complication - Stable, continue albuterol MDI  Essential hypertension - Stable on ARB, could transition to [facility], defer to cards     DVT prophylaxis: Heparin subcutaneous Code Status: Full code Family Communication: No family at bedside Disposition Plan: Home in 1 to 2 days   Consultants:  Cardiology   Procedures: Cardiac cath 10/06/2021       The left ventricular systolic function is normal.   LV end diastolic pressure is moderately elevated.   The left ventricular ejection fraction is 55-65% by visual estimate.   There is no aortic valve stenosis.   No angiographically apparent coronary artery disease.   Hemodynamic findings consistent with moderate pulmonary hypertension.   On 2L/min [place] O2:Ao sat 99%, PA sat 71%, PA 53/30, mean PA pressure 43 mm Hg; mean PCWP 24 mm Hg; CO 7.35 L/min; CI 3.04.   Mild volume overload.  Continue medical therapy    Antimicrobials:    Objective: Vitals:   10/08/21 0320 10/08/21 0526 10/08/21 0641 10/08/21 0812  BP: 110/82   119/76  Pulse: 76   82  Resp: 17   15  Temp: 98.9 F (37.2 C)   98.7 F (37.1 C)  TempSrc: Oral   Oral  SpO2: 99%  96% 91%  Weight:  (!) 167 kg    Height:        Intake/Output Summary (Last 24 hours) at 10/08/2021 1029 Last data filed at 10/08/2021 0900 Gross per 24 hour  Intake 1680 ml  Output 2500 ml  Net -820 ml   Filed  Weights   10/06/21 0506 10/07/21 0600 10/08/21 0526  Weight: (!) 166.7 kg (!) 164.4 kg (!) 167 kg    Examination:  General exam: Morbidly obese chronically ill female sitting up in bed, AAOx3, no distress HEENT: Neck obese unable to assess JVD CVS: S1-S2, regular rate rhythm Lungs: Clear Abdomen: Soft, nontender, bowel sounds present Extremities: No edema cardiology Skin: No  rashes Psychiatry:  Mood & affect appropriate.     Data Reviewed:   CBC: Recent Labs  Lab 10/03/21 2120 10/04/21 0618 10/05/21 0050 10/06/21 1640 10/06/21 1645  WBC 10.7* 7.9 9.0  --   --   NEUTROABS 7.6  --   --   --   --   HGB 11.7* 11.0* 11.5* 12.6 12.6   12.9  HCT 36.4 35.1* 36.4 37.0 37.0   38.0  MCV 92.4 91.9 91.9  --   --   PLT 352 281 329  --   --    Basic Metabolic Panel: Recent Labs  Lab 10/04/21 0618 10/05/21 0050 10/06/21 0113 10/06/21 1640 10/06/21 1645 10/07/21 0033 10/08/21 0052  NA 139 138 133* 137 139   137 133* 133*  K 3.6 3.4* 3.7 4.2 4.2   4.4 3.9 3.9  CL 97* 96* 92*  --   --  95* 92*  CO2 '31 31 31  '$ --   --  28 32  GLUCOSE 96 87 114*  --   --  97 99  BUN '14 18 18  '$ --   --  15 21*  CREATININE 0.94 1.15* 1.10*  --   --  1.08* 1.10*  CALCIUM 8.7* 8.7* 8.8*  --   --  8.6* 8.3*   GFR: Estimated Creatinine Clearance: 84.8 mL/min (A) (by C-G formula based on SCr of 1.1 mg/dL (H)). Liver Function Tests: Recent Labs  Lab 10/03/21 2120  AST 20  ALT 19  ALKPHOS 117  BILITOT 0.4  PROT 7.8  ALBUMIN 3.8   No results for input(s): LIPASE, AMYLASE in the last 168 hours. No results for input(s): AMMONIA in the last 168 hours. Coagulation Profile: No results for input(s): INR, PROTIME in the last 168 hours. Cardiac Enzymes: No results for input(s): CKTOTAL, CKMB, CKMBINDEX, TROPONINI in the last 168 hours. BNP (last 3 results) Recent Labs    08/09/21 1651 08/25/21 1050 09/26/21 1138  PROBNP 106 38 115   HbA1C: No results for input(s): HGBA1C in the last 72 hours. CBG: Recent Labs  Lab 10/05/21 0614  GLUCAP 126*   Lipid Profile: No results for input(s): CHOL, HDL, LDLCALC, TRIG, CHOLHDL, LDLDIRECT in the last 72 hours. Thyroid Function Tests: No results for input(s): TSH, T4TOTAL, FREET4, T3FREE, THYROIDAB in the last 72 hours. Anemia Panel: No results for input(s): VITAMINB12, FOLATE, FERRITIN, TIBC, IRON, RETICCTPCT in the last 72  hours. Urine analysis:    Component Value Date/Time   COLORURINE YELLOW 08/18/2021 [place] 08/18/2021 1253   LABSPEC 1.010 08/18/2021 1253   PHURINE 5.5 08/18/2021 1253   GLUCOSEU NEGATIVE 08/18/2021 1253   HGBUR TRACE (A) 08/18/2021 [place] 08/18/2021 [place] 08/18/2021 1253   PROTEINUR NEGATIVE 08/18/2021 1253   NITRITE NEGATIVE 08/18/2021 1253   LEUKOCYTESUR NEGATIVE 08/18/2021 1253   Sepsis Labs: '@LABRCNTIP'$ (procalcitonin:4,lacticidven:4)  ) Recent Results (from the past 240 hour(s))  Resp Panel by RT-PCR (Flu A&B, Covid) Nasopharyngeal Swab     Status: None   Collection Time: 10/03/21 11:27 PM   Specimen: Nasopharyngeal Swab;  Nasopharyngeal(NP) swabs in vial transport medium  Result Value Ref Range Status   SARS Coronavirus 2 by RT PCR NEGATIVE NEGATIVE Final    Comment: (NOTE) SARS-CoV-2 target nucleic acids are NOT DETECTED.  The SARS-CoV-2 RNA is generally detectable in upper respiratory specimens during the acute phase of infection. The lowest concentration of SARS-CoV-2 viral copies this assay can detect is 138 copies/mL. A negative result does not preclude SARS-Cov-2 infection and should not be used as the sole basis for treatment or other patient management decisions. A negative result may occur with  improper specimen collection/handling, submission of specimen other than nasopharyngeal swab, presence of viral mutation(s) within the areas targeted by this assay, and inadequate number of viral copies(<138 copies/mL). A negative result must be combined with clinical observations, patient history, and epidemiological information. The expected result is Negative.  Fact Sheet for Patients:  EntrepreneurPulse.com.au  Fact Sheet for Healthcare Providers:  IncredibleEmployment.be  This test is no t yet approved or cleared by the [place] FDA and  has been authorized for  detection and/or diagnosis of SARS-CoV-2 by FDA under an Emergency Use Authorization (EUA). This EUA will remain  in effect (meaning this test can be used) for the duration of the COVID-19 declaration under Section 564(b)(1) of the Act, 21 U.S.C.section 360bbb-3(b)(1), unless the authorization is terminated  or revoked sooner.       Influenza A by PCR NEGATIVE NEGATIVE Final   Influenza B by PCR NEGATIVE NEGATIVE Final    Comment: (NOTE) The Xpert Xpress SARS-CoV-2/FLU/RSV plus assay is intended as an aid in the diagnosis of influenza from Nasopharyngeal swab specimens and should not be used as a sole basis for treatment. Nasal washings and aspirates are unacceptable for Xpert Xpress SARS-CoV-2/FLU/RSV testing.  Fact Sheet for Patients: EntrepreneurPulse.com.au  Fact Sheet for Healthcare Providers: IncredibleEmployment.be  This test is not yet approved or cleared by the [place] FDA and has been authorized for detection and/or diagnosis of SARS-CoV-2 by FDA under an Emergency Use Authorization (EUA). This EUA will remain in effect (meaning this test can be used) for the duration of the COVID-19 declaration under Section 564(b)(1) of the Act, 21 U.S.C. section 360bbb-3(b)(1), unless the authorization is terminated or revoked.  Performed at [facility], [address] [place], [place] [address]   MRSA Next Gen by PCR, Nasal     Status: None   Collection Time: 10/04/21  2:57 AM   Specimen: Nasal Mucosa; Nasal Swab  Result Value Ref Range Status   MRSA by PCR Next Gen NOT DETECTED NOT DETECTED Final    Comment: (NOTE) The GeneXpert MRSA Assay (FDA approved for NASAL specimens only), is one component of a comprehensive MRSA colonization surveillance program. It is not intended to diagnose MRSA infection nor to guide or monitor treatment for MRSA infections. Test performance is not FDA approved in patients less than 16  years old. Performed at [facility], [place] [address], [place], [place] [address]      Radiology Studies: CARDIAC CATHETERIZATION  Result Date: 10/06/2021   The left ventricular systolic function is normal.   LV end diastolic pressure is moderately elevated.   The left ventricular ejection fraction is 55-65% by visual estimate.   There is no aortic valve stenosis.   No angiographically apparent coronary artery disease.   Hemodynamic findings consistent with moderate pulmonary hypertension.   On 2L/min [place] O2:Ao sat 99%, PA sat 71%, PA 53/30, mean PA pressure 43 mm Hg;  mean PCWP 24 mm Hg; CO 7.35 L/min; CI 3.04. Mild volume overload.  Continue medical therapy.     Scheduled Meds:  furosemide  80 mg Intravenous Q12H   heparin  5,000 Units Subcutaneous Q8H   irbesartan  37.5 mg Oral Daily   mouth rinse  15 mL Mouth Rinse BID   sodium chloride flush  3 mL Intravenous Q12H   sodium chloride flush  3 mL Intravenous Q12H   spironolactone  12.5 mg Oral Daily   Continuous Infusions:  sodium chloride       LOS: 2 days    Time spent:70mn    PDomenic Polite MD [facility]   10/08/2021, 10:29 AM

## 2021-10-08 NOTE — Progress Notes (Signed)
MD notified of positive COVID results. Started pt on airborne and contact isolation. ?

## 2021-10-09 ENCOUNTER — Encounter (HOSPITAL_COMMUNITY): Payer: Self-pay | Admitting: Interventional Cardiology

## 2021-10-09 ENCOUNTER — Inpatient Hospital Stay (HOSPITAL_COMMUNITY): Payer: Commercial Managed Care - HMO

## 2021-10-09 DIAGNOSIS — R0609 Other forms of dyspnea: Secondary | ICD-10-CM | POA: Diagnosis not present

## 2021-10-09 DIAGNOSIS — R072 Precordial pain: Secondary | ICD-10-CM | POA: Diagnosis not present

## 2021-10-09 DIAGNOSIS — R0602 Shortness of breath: Secondary | ICD-10-CM | POA: Insufficient documentation

## 2021-10-09 DIAGNOSIS — I10 Essential (primary) hypertension: Secondary | ICD-10-CM | POA: Diagnosis not present

## 2021-10-09 DIAGNOSIS — I5033 Acute on chronic diastolic (congestive) heart failure: Secondary | ICD-10-CM | POA: Diagnosis not present

## 2021-10-09 DIAGNOSIS — U071 COVID-19: Secondary | ICD-10-CM | POA: Diagnosis not present

## 2021-10-09 DIAGNOSIS — G4733 Obstructive sleep apnea (adult) (pediatric): Secondary | ICD-10-CM

## 2021-10-09 DIAGNOSIS — Z6841 Body Mass Index (BMI) 40.0 and over, adult: Secondary | ICD-10-CM

## 2021-10-09 LAB — BASIC METABOLIC PANEL
Anion gap: 10 (ref 5–15)
BUN: 22 mg/dL — ABNORMAL HIGH (ref 6–20)
CO2: 30 mmol/L (ref 22–32)
Calcium: 8.2 mg/dL — ABNORMAL LOW (ref 8.9–10.3)
Chloride: 93 mmol/L — ABNORMAL LOW (ref 98–111)
Creatinine, Ser: 1.07 mg/dL — ABNORMAL HIGH (ref 0.44–1.00)
GFR, Estimated: >60 mL/min (ref >60)
Glucose, Bld: 115 mg/dL — ABNORMAL HIGH (ref 70–99)
Potassium: 3.6 mmol/L (ref 3.5–5.1)
Sodium: 133 mmol/L — ABNORMAL LOW (ref 135–145)

## 2021-10-09 LAB — C-REACTIVE PROTEIN: CRP: 1.6 mg/dL — ABNORMAL HIGH (ref <1.0)

## 2021-10-09 LAB — D-DIMER, QUANTITATIVE: D-Dimer, Quant: 0.52 ug{FEU}/mL — ABNORMAL HIGH (ref 0.00–0.50)

## 2021-10-09 LAB — FERRITIN: Ferritin: 221 ng/mL (ref 11–307)

## 2021-10-09 MED ORDER — NIRMATRELVIR/RITONAVIR (PAXLOVID)TABLET
3.0000 | ORAL_TABLET | Freq: Two times a day (BID) | ORAL | Status: DC
  Administered 2021-10-09 – 2021-10-11 (×5): 3 via ORAL
  Filled 2021-10-09: qty 30

## 2021-10-09 MED ORDER — SENNOSIDES-DOCUSATE SODIUM 8.6-50 MG PO TABS
1.0000 | ORAL_TABLET | Freq: Two times a day (BID) | ORAL | Status: DC
  Administered 2021-10-09 – 2021-10-10 (×4): 1 via ORAL
  Filled 2021-10-09 (×5): qty 1

## 2021-10-09 MED ORDER — POLYETHYLENE GLYCOL 3350 17 G PO PACK
17.0000 g | PACK | Freq: Every day | ORAL | Status: DC
  Administered 2021-10-09: 17 g via ORAL
  Filled 2021-10-09 (×2): qty 1

## 2021-10-09 MED ORDER — FUROSEMIDE 40 MG PO TABS
80.0000 mg | ORAL_TABLET | Freq: Two times a day (BID) | ORAL | Status: DC
  Administered 2021-10-10: 80 mg via ORAL
  Filled 2021-10-09: qty 2

## 2021-10-09 NOTE — Progress Notes (Signed)
PROGRESS NOTE    OPHA MCGHEE  AUQ:[id number] DOB: August 18, 1965 DOA: 10/03/2021 PCP: Fredirick Lathe, PA-C  Brief Narrative:Narrative56/female with morbid obesity, BMI of 73, chronic diastolic CHF, asthma, depression presented to the ED with worsening dyspnea for the past 3 to 4 days, recently discharged over a month ago after hospitalization for same. -In the ED blood pressure was 150/85, BNP 46, CT chest noted dilation of pulmonary trunk suggestive of pulmonary hypertension and posterior mediastinal/paraspinal lesion -Improving with diuresis -Cardiac cath 3/3 noted no CAD, moderate PAH, mild volume overload -3/5, had symptoms of URI, same evening blood pressure dropped to 70s, with severe orthostasis, given a fluid bolus   Subjective: -Feels tired, breathing okay overall, some cough  Assessment and Plan:  * Acute on chronic diastolic (congestive) heart failure (HCC) -18 pound weight gain following recent discharge -Has been taking Lasix 40 Mg daily at baseline -Recent echo 1/23 with preserved EF, grade 1 diastolic dysfunction, normal RV function -Clinically improving with diuresis, she is 7.3L negative -Right heart cath with mildly elevated filling pressures, LHC with normal coronaries -Diuretics held yesterday afternoon given acute drop in blood pressure to 70s, required 500 mL bolus, stable now -Restarting p.o. Lasix today -Entresto and Aldactone on hold, restart slowly  Mediastinal mass 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, neurogenic cyst, or esophageal foregut duplication cyst, no further work-up indicated  COVID-19 Tested positive for COVID yesterday, will check chest x-ray and inflammatory markers -Started Paxlovid  Obstructive sleep apnea - Due for sleep study in March, will need home O2 set up nightly  Class 3 severe obesity with serious comorbidity and body mass index (BMI) greater than or equal to 70  in adult [facility]) BMI 73 -Dietitian consult, needs aggressive lifestyle modification -Has recently seen a bariatric surgeon at [facility], supposed to lose 40 pounds prior to gastric sleeve  Dyslipidemia - We will continue statin therapy.  Mild intermittent asthma without complication - Stable, continue albuterol MDI  Essential hypertension - See discussion above, ARB discontinued     DVT prophylaxis: Heparin subcutaneous Code Status: Full code Family Communication: No family at bedside Disposition Plan: Home in 1 to 2 days   Consultants:  Cardiology   Procedures: Cardiac cath 10/06/2021       The left ventricular systolic function is normal.   LV end diastolic pressure is moderately elevated.   The left ventricular ejection fraction is 55-65% by visual estimate.   There is no aortic valve stenosis.   No angiographically apparent coronary artery disease.   Hemodynamic findings consistent with moderate pulmonary hypertension.   On 2L/min [place] O2:Ao sat 99%, PA sat 71%, PA 53/30, mean PA pressure 43 mm Hg; mean PCWP 24 mm Hg; CO 7.35 L/min; CI 3.04.   Mild volume overload.  Continue medical therapy    Antimicrobials:    Objective: Vitals:   10/08/21 2342 10/09/21 0001 10/09/21 0430 10/09/21 0829  BP: 111/70 111/70 113/67 118/75  Pulse: 65 68 65 63  Resp: '18 16 17 17  '$ Temp: 98.3 F (36.8 C)  98 F (36.7 C) 98.1 F (36.7 C)  TempSrc: Oral  Oral   SpO2: 93% 95% 99% 97%  Weight:   (!) 165.1 kg   Height:        Intake/Output Summary (Last 24 hours) at 10/09/2021 1016 Last data filed at 10/09/2021 0442 Gross per 24 hour  Intake 790.12 ml  Output 400 ml  Net  390.12 ml   Filed Weights   10/07/21 0600 10/08/21 0526 10/09/21 0430  Weight: (!) 164.4 kg (!) 167 kg (!) 165.1 kg    Examination:  General exam: Morbidly obese pleasant female sitting up in bed, AAOx3, no distress appears tired CVS: S1-S2, regular rate rhythm Lungs: Clear bilaterally Abdomen: Soft,  nontender, bowel sounds present Extremities: No edema Skin: No rashes Psychiatry:  Mood & affect appropriate.     Data Reviewed:   CBC: Recent Labs  Lab 10/03/21 2120 10/04/21 0618 10/05/21 0050 10/06/21 1640 10/06/21 1645  WBC 10.7* 7.9 9.0  --   --   NEUTROABS 7.6  --   --   --   --   HGB 11.7* 11.0* 11.5* 12.6 12.6   12.9  HCT 36.4 35.1* 36.4 37.0 37.0   38.0  MCV 92.4 91.9 91.9  --   --   PLT 352 281 329  --   --    Basic Metabolic Panel: Recent Labs  Lab 10/05/21 0050 10/06/21 0113 10/06/21 1640 10/06/21 1645 10/07/21 0033 10/08/21 0052 10/09/21 0108  NA 138 133* 137 139   137 133* 133* 133*  K 3.4* 3.7 4.2 4.2   4.4 3.9 3.9 3.6  CL 96* 92*  --   --  95* 92* 93*  CO2 31 31  --   --  28 32 30  GLUCOSE 87 114*  --   --  97 99 115*  BUN 18 18  --   --  15 21* 22*  CREATININE 1.15* 1.10*  --   --  1.08* 1.10* 1.07*  CALCIUM 8.7* 8.8*  --   --  8.6* 8.3* 8.2*   GFR: Estimated Creatinine Clearance: 86.5 mL/min (A) (by C-G formula based on SCr of 1.07 mg/dL (H)). Liver Function Tests: Recent Labs  Lab 10/03/21 2120  AST 20  ALT 19  ALKPHOS 117  BILITOT 0.4  PROT 7.8  ALBUMIN 3.8   No results for input(s): LIPASE, AMYLASE in the last 168 hours. No results for input(s): AMMONIA in the last 168 hours. Coagulation Profile: No results for input(s): INR, PROTIME in the last 168 hours. Cardiac Enzymes: No results for input(s): CKTOTAL, CKMB, CKMBINDEX, TROPONINI in the last 168 hours. BNP (last 3 results) Recent Labs    08/09/21 1651 08/25/21 1050 09/26/21 1138  PROBNP 106 38 115   HbA1C: No results for input(s): HGBA1C in the last 72 hours. CBG: Recent Labs  Lab 10/05/21 0614  GLUCAP 126*   Lipid Profile: No results for input(s): CHOL, HDL, LDLCALC, TRIG, CHOLHDL, LDLDIRECT in the last 72 hours. Thyroid Function Tests: No results for input(s): TSH, T4TOTAL, FREET4, T3FREE, THYROIDAB in the last 72 hours. Anemia Panel: Recent Labs     10/09/21 0841  FERRITIN 221   Urine analysis:    Component Value Date/Time   COLORURINE YELLOW 08/18/2021 [place] 08/18/2021 1253   LABSPEC 1.010 08/18/2021 1253   PHURINE 5.5 08/18/2021 1253   GLUCOSEU NEGATIVE 08/18/2021 1253   HGBUR TRACE (A) 08/18/2021 1253   BILIRUBINUR NEGATIVE 08/18/2021 [place] 08/18/2021 1253   PROTEINUR NEGATIVE 08/18/2021 1253   NITRITE NEGATIVE 08/18/2021 1253   LEUKOCYTESUR NEGATIVE 08/18/2021 1253   Sepsis Labs: '@LABRCNTIP'$ (procalcitonin:4,lacticidven:4)  ) Recent Results (from the past 240 hour(s))  Resp Panel by RT-PCR (Flu A&B, Covid) Nasopharyngeal Swab     Status: None   Collection Time: 10/03/21 11:27 PM   Specimen: Nasopharyngeal Swab; Nasopharyngeal(NP) swabs in vial  transport medium  Result Value Ref Range Status   SARS Coronavirus 2 by RT PCR NEGATIVE NEGATIVE Final    Comment: (NOTE) SARS-CoV-2 target nucleic acids are NOT DETECTED.  The SARS-CoV-2 RNA is generally detectable in upper respiratory specimens during the acute phase of infection. The lowest concentration of SARS-CoV-2 viral copies this assay can detect is 138 copies/mL. A negative result does not preclude SARS-Cov-2 infection and should not be used as the sole basis for treatment or other patient management decisions. A negative result may occur with  improper specimen collection/handling, submission of specimen other than nasopharyngeal swab, presence of viral mutation(s) within the areas targeted by this assay, and inadequate number of viral copies(<138 copies/mL). A negative result must be combined with clinical observations, patient history, and epidemiological information. The expected result is Negative.  Fact Sheet for Patients:  EntrepreneurPulse.com.au  Fact Sheet for Healthcare Providers:  IncredibleEmployment.be  This test is no t yet approved or cleared by the [place] FDA and   has been authorized for detection and/or diagnosis of SARS-CoV-2 by FDA under an Emergency Use Authorization (EUA). This EUA will remain  in effect (meaning this test can be used) for the duration of the COVID-19 declaration under Section 564(b)(1) of the Act, 21 U.S.C.section 360bbb-3(b)(1), unless the authorization is terminated  or revoked sooner.       Influenza A by PCR NEGATIVE NEGATIVE Final   Influenza B by PCR NEGATIVE NEGATIVE Final    Comment: (NOTE) The Xpert Xpress SARS-CoV-2/FLU/RSV plus assay is intended as an aid in the diagnosis of influenza from Nasopharyngeal swab specimens and should not be used as a sole basis for treatment. Nasal washings and aspirates are unacceptable for Xpert Xpress SARS-CoV-2/FLU/RSV testing.  Fact Sheet for Patients: EntrepreneurPulse.com.au  Fact Sheet for Healthcare Providers: IncredibleEmployment.be  This test is not yet approved or cleared by the [place] FDA and has been authorized for detection and/or diagnosis of SARS-CoV-2 by FDA under an Emergency Use Authorization (EUA). This EUA will remain in effect (meaning this test can be used) for the duration of the COVID-19 declaration under Section 564(b)(1) of the Act, 21 U.S.C. section 360bbb-3(b)(1), unless the authorization is terminated or revoked.  Performed at [facility], [address] [place], [place] [address]   MRSA Next Gen by PCR, Nasal     Status: None   Collection Time: 10/04/21  2:57 AM   Specimen: Nasal Mucosa; Nasal Swab  Result Value Ref Range Status   MRSA by PCR Next Gen NOT DETECTED NOT DETECTED Final    Comment: (NOTE) The GeneXpert MRSA Assay (FDA approved for NASAL specimens only), is one component of a comprehensive MRSA colonization surveillance program. It is not intended to diagnose MRSA infection nor to guide or monitor treatment for MRSA infections. Test performance is not FDA approved in  patients less than 70 years old. Performed at [facility], [place] [address] [place], [place] [address]   Resp Panel by RT-PCR (Flu A&B, Covid) Nasopharyngeal Swab     Status: Abnormal   Collection Time: 10/08/21 10:31 AM   Specimen: Nasopharyngeal Swab; Nasopharyngeal(NP) swabs in vial transport medium  Result Value Ref Range Status   SARS Coronavirus 2 by RT PCR POSITIVE (A) NEGATIVE Final    Comment: (NOTE) SARS-CoV-2 target nucleic acids are DETECTED.  The SARS-CoV-2 RNA is generally detectable in upper respiratory specimens during the acute phase of infection. Positive results are indicative of the presence of the identified  virus, but do not rule out bacterial infection or co-infection with other pathogens not detected by the test. Clinical correlation with patient history and other diagnostic information is necessary to determine patient infection status. The expected result is Negative.  Fact Sheet for Patients: EntrepreneurPulse.com.au  Fact Sheet for Healthcare Providers: IncredibleEmployment.be  This test is not yet approved or cleared by the [place] FDA and  has been authorized for detection and/or diagnosis of SARS-CoV-2 by FDA under an Emergency Use Authorization (EUA).  This EUA will remain in effect (meaning this test can be used) for the duration of  the COVID-19 declaration under Section 564(b)(1) of the A ct, 21 U.S.C. section 360bbb-3(b)(1), unless the authorization is terminated or revoked sooner.     Influenza A by PCR NEGATIVE NEGATIVE Final   Influenza B by PCR NEGATIVE NEGATIVE Final    Comment: (NOTE) The Xpert Xpress SARS-CoV-2/FLU/RSV plus assay is intended as an aid in the diagnosis of influenza from Nasopharyngeal swab specimens and should not be used as a sole basis for treatment. Nasal washings and aspirates are unacceptable for Xpert Xpress SARS-CoV-2/FLU/RSV testing.  Fact Sheet for  Patients: EntrepreneurPulse.com.au  Fact Sheet for Healthcare Providers: IncredibleEmployment.be  This test is not yet approved or cleared by the [place] FDA and has been authorized for detection and/or diagnosis of SARS-CoV-2 by FDA under an Emergency Use Authorization (EUA). This EUA will remain in effect (meaning this test can be used) for the duration of the COVID-19 declaration under Section 564(b)(1) of the Act, 21 U.S.C. section 360bbb-3(b)(1), unless the authorization is terminated or revoked.  Performed at [facility], [place] [address], [place], [place] [address]      Radiology Studies: DG CHEST PORT 1 VIEW  Result Date: 10/09/2021 CLINICAL DATA:  Dyspnea EXAM: PORTABLE CHEST 1 VIEW COMPARISON:  October 03, 2021 and MRI October 05, 2021 FINDINGS: The cardiac silhouette is unchanged. Again seen is the lobular density extending from the upper posterior mediastinum superior to the hilum previously characterized as a benign cystic lesion on MRI October 05, 2021. Bibasilar opacities are favored to reflect atelectasis and artifact secondary to technique. No visible pleural effusion or pneumothorax. The visualized skeletal structures are unchanged. IMPRESSION: Bibasilar opacities are favored to reflect atelectasis and artifact secondary to technique. Underlying infection is not excluded. Electronically Signed   By: Dahlia Bailiff M.D.   On: 10/09/2021 08:09     Scheduled Meds:  [START ON 10/10/2021] furosemide  80 mg Oral BID   heparin  5,000 Units Subcutaneous Q8H   mouth rinse  15 mL Mouth Rinse BID   nirmatrelvir/ritonavir EUA  3 tablet Oral BID   polyethylene glycol  17 g Oral Daily   senna-docusate  1 tablet Oral BID   sodium chloride flush  3 mL Intravenous Q12H   sodium chloride flush  3 mL Intravenous Q12H   Continuous Infusions:  sodium chloride       LOS: 3 days    Time spent:79mn    PDomenic Polite MD [facility]   10/09/2021, 10:16 AM

## 2021-10-09 NOTE — Progress Notes (Signed)
? ?Progress Note ? ?Patient Name: Christina Dominguez ?Date of Encounter: 10/09/2021 ? ?[facility] Cardiologist: Shirlee More, MD  ? ?Subjective  ? ?Feeling better.  Respiratory status is much better.  She has been able to lay flat in the bed.  Prior to admission she got short of breath with lying flat. ? ?Inpatient Medications  ?  ?Scheduled Meds: ? heparin  5,000 Units Subcutaneous Q8H  ? mouth rinse  15 mL Mouth Rinse BID  ? nirmatrelvir/ritonavir EUA  3 tablet Oral BID  ? polyethylene glycol  17 g Oral Daily  ? senna-docusate  1 tablet Oral BID  ? sodium chloride flush  3 mL Intravenous Q12H  ? sodium chloride flush  3 mL Intravenous Q12H  ? ?Continuous Infusions: ? sodium chloride    ? ?PRN Meds: ?sodium chloride, acetaminophen, albuterol, guaiFENesin-dextromethorphan, magnesium hydroxide, ondansetron (ZOFRAN) IV, ondansetron **OR** [DISCONTINUED] ondansetron (ZOFRAN) IV, sodium chloride flush, traZODone  ? ?Vital Signs  ?  ?Vitals:  ? 10/08/21 2342 10/09/21 0001 10/09/21 0430 10/09/21 0829  ?BP: 111/70 111/70 113/67 118/75  ?Pulse: 65 68 65 63  ?Resp: '18 16 17 17  '$ ?Temp: 98.3 ?F (36.8 ?C)  98 ?F (36.7 ?C) 98.1 ?F (36.7 ?C)  ?TempSrc: Oral  Oral   ?SpO2: 93% 95% 99% 97%  ?Weight:   (!) 165.1 kg   ?Height:      ? ? ?Intake/Output Summary (Last 24 hours) at 10/09/2021 0830 ?Last data filed at 10/09/2021 0442 ?Gross per 24 hour  ?Intake 1150.12 ml  ?Output 400 ml  ?Net 750.12 ml  ? ?Last 3 Weights 10/09/2021 10/08/2021 10/07/2021  ?Weight (lbs) 363 lb 15.7 oz 368 lb 2.7 oz 362 lb 7 oz  ?Weight (kg) 165.1 kg 167 kg 164.4 kg  ?   ? ?Telemetry  ?  ?Sinus rhythm.  No events.- Personally Reviewed ? ?ECG  ?  ?N/a - Personally Reviewed ? ?Physical Exam  ? ?VS:  BP 118/75 (BP Location: Left Wrist)   Pulse 63   Temp 98.1 ?F (36.7 ?C)   Resp 17   Ht 5' (1.524 m)   Wt (!) 165.1 kg   SpO2 97%   BMI 71.08 kg/m?  , BMI Body mass index is 71.08 kg/m?. ?GENERAL:  Well appearing ?HEENT: Pupils equal round and reactive, fundi not  visualized, oral mucosa unremarkable ?NECK:  No jugular venous distention, waveform within normal limits, carotid upstroke brisk and symmetric, no bruits, no thyromegaly ?LUNGS:  Diffuse rhonchi ?HEART:  RRR.  PMI not displaced or sustained,S1 and S2 within normal limits, no S3, no S4, no clicks, no rubs, no murmurs ?ABD:  Flat, positive bowel sounds normal in frequency in pitch, no bruits, no rebound, no guarding, no midline pulsatile mass, no hepatomegaly, no splenomegaly ?EXT:  2 plus pulses throughout, no edema, no cyanosis no clubbing ?SKIN:  No rashes no nodules ?NEURO:  Cranial nerves II through XII grossly intact, motor grossly intact throughout ?PSYCH:  Cognitively intact, oriented to person place and time ? ? ?Labs  ?  ?High Sensitivity Troponin:   ?Recent Labs  ?Lab 10/03/21 ?2120 10/03/21 ?2326  ?TROPONINIHS 4 3  ?   ?Chemistry ?Recent Labs  ?Lab 10/03/21 ?2120 10/04/21 ?0618 10/07/21 ?9622 10/08/21 ?2979 10/09/21 ?8921  ?NA 135   < > 133* 133* 133*  ?K 3.8   < > 3.9 3.9 3.6  ?CL 96*   < > 95* 92* 93*  ?CO2 30   < > 28 32 30  ?GLUCOSE 92   < >  97 99 115*  ?BUN 19   < > 15 21* 22*  ?CREATININE 0.95   < > 1.08* 1.10* 1.07*  ?CALCIUM 8.7*   < > 8.6* 8.3* 8.2*  ?PROT 7.8  --   --   --   --   ?ALBUMIN 3.8  --   --   --   --   ?AST 20  --   --   --   --   ?ALT 19  --   --   --   --   ?ALKPHOS 117  --   --   --   --   ?BILITOT 0.4  --   --   --   --   ?GFRNONAA >60   < > >60 59* >60  ?ANIONGAP 9   < > '10 9 10  '$ ? < > = values in this interval not displayed.  ?  ?Lipids No results for input(s): CHOL, TRIG, HDL, LABVLDL, LDLCALC, CHOLHDL in the last 168 hours.  ?Hematology ?Recent Labs  ?Lab 10/03/21 ?2120 10/04/21 ?0618 10/05/21 ?0050 10/06/21 ?1640 10/06/21 ?1645  ?WBC 10.7* 7.9 9.0  --   --   ?RBC 3.94 3.82* 3.96  --   --   ?HGB 11.7* 11.0* 11.5* 12.6 12.6  12.9  ?HCT 36.4 35.1* 36.4 37.0 37.0  38.0  ?MCV 92.4 91.9 91.9  --   --   ?MCH 29.7 28.8 29.0  --   --   ?MCHC 32.1 31.3 31.6  --   --   ?RDW 15.0 14.9  14.7  --   --   ?PLT 352 281 329  --   --   ? ?Thyroid No results for input(s): TSH, FREET4 in the last 168 hours.  ?BNP ?Recent Labs  ?Lab 10/03/21 ?2120  ?BNP 46.0  ?  ?DDimer No results for input(s): DDIMER in the last 168 hours.  ? ?Radiology  ?  ?DG CHEST PORT 1 VIEW ? ?Result Date: 10/09/2021 ?CLINICAL DATA:  Dyspnea EXAM: PORTABLE CHEST 1 VIEW COMPARISON:  October 03, 2021 and MRI October 05, 2021 FINDINGS: The cardiac silhouette is unchanged. Again seen is the lobular density extending from the upper posterior mediastinum superior to the hilum previously characterized as a benign cystic lesion on MRI October 05, 2021. Bibasilar opacities are favored to reflect atelectasis and artifact secondary to technique. No visible pleural effusion or pneumothorax. The visualized skeletal structures are unchanged. IMPRESSION: Bibasilar opacities are favored to reflect atelectasis and artifact secondary to technique. Underlying infection is not excluded. Electronically Signed   By: Dahlia Bailiff M.D.   On: 10/09/2021 08:09   ? ?Cardiac Studies  ? ?ECHO: 08/20/2021 ? 1. Left ventricular ejection fraction, by estimation, is 55 to 60%. The  ?left ventricle has normal function. The left ventricle has no regional  ?wall motion abnormalities. There is mild concentric left ventricular  ?hypertrophy. Left ventricular diastolic parameters are consistent with Grade I diastolic dysfunction (impaired relaxation).  ? 2. Right ventricular systolic function is normal. The right ventricular  ?size is normal. Tricuspid regurgitation signal is inadequate for assessing PA pressure.  ? 3. The mitral valve is normal in structure. No evidence of mitral valve regurgitation. No evidence of mitral stenosis.  ? 4. The aortic valve was not well visualized. Aortic valve regurgitation is not visualized. No aortic stenosis is present.  ? 5. The inferior vena cava is normal in size with <50% respiratory  ?variability, suggesting right atrial pressure of 8  mmHg.  ?  ?  Cardiac cath 10/06/2021 ?  ?  ?  The left ventricular systolic function is normal. ?  LV end diastolic pressure is moderately elevated. ?  The left ventricular ejection fraction is 55-65% by visual estimate. ?  There is no aortic valve stenosis. ?  No angiographically apparent coronary artery disease. ?  Hemodynamic findings consistent with moderate pulmonary hypertension. ?  On 2L/min [place] O2:Ao sat 99%, PA sat 71%, PA 53/30, mean PA pressure 43 mm Hg; mean PCWP 24 mm Hg; CO 7.35 L/min; CI 3.04. ?  ?Mild volume overload.  Continue medical therapy.  ? ?Patient Profile  ?   ?56 y.o. female with chronic diastolic heart failure, hypertension, prediabetes, asthma, bipolar disorder, and morbid obesity admitted with acute on chronic diastolic heart failure and pulmonary hypertension. ? ?Assessment & Plan  ?  ?#Acute on chronic diastolic heart failure: ?#Pulm hypertension: ?By recorded ins and outs she is +750 mL.  However her weight is down 2 kilograms by weight..  Renal function continues to improve and she still remains hyponatremic.  Yesterday she did not receive any diuretics because of hypotension.  We will resume diuresis today with Lasix 80 mg twice daily..  Moderate pulmonary hypertension on right heart cath.  Mean PA pressure was 43 mmHg.  Cardiac output and index were preserved.  Right atrial pressure was 19.  Continue to monitor sodium and renal function. ? ?#OSA/OHS: ?#Hypercapnic respiratory failure: ?Not previously diagnosed.  Planning for outpatient supplemental oxygen at night for now and will need outpatient sleep study for CPAP/BiPAP therapy.  Diuresis as above. ? ?#Hypertension: ?Irbesartan was held due to low blood pressures. ? ?# COVID-19:  ?Neighbor visited and she developed symptoms.  Tested positive in the hospital.  She is high risk and has been started on Paxlovid.  Rhonchi on exam but her oxygen requirement has not increased. ? ?For questions or updates, please contact [place] ?Please consult [email or web address] for contact info under  ? ?  ?   ?Signed, ?Skeet Latch, MD  ?10/09/2021, 8:30 AM   ? ?

## 2021-10-09 NOTE — Progress Notes (Signed)
Pharmacy Antibiotic Note ? ?Christina Dominguez is a 56 y.o. female admitted on 10/03/2021 with COVID.  Pharmacy has been consulted for remdesevir dosing. Patient is not requiring additional oxygen. Discussed with MD who agreed with starting Paxlovid instead. No drug interactions found. Good renal function. ? ?Plan: ?Start paxlovid x5 day course  ? ?Height: 5' (152.4 cm) ?Weight: (!) 165.1 kg (363 lb 15.7 oz) ?IBW/kg (Calculated) : 45.5 ? ?Temp (24hrs), Avg:98.4 ?F (36.9 ?C), Min:98 ?F (36.7 ?C), Max:98.7 ?F (37.1 ?C) ? ?Recent Labs  ?Lab 10/03/21 ?2120 10/04/21 ?0618 10/05/21 ?0050 10/06/21 ?0113 10/07/21 ?1610 10/08/21 ?9604 10/09/21 ?5409  ?WBC 10.7* 7.9 9.0  --   --   --   --   ?CREATININE 0.95 0.94 1.15* 1.10* 1.08* 1.10* 1.07*  ?  ?Estimated Creatinine Clearance: 86.5 mL/min (A) (by C-G formula based on SCr of 1.07 mg/dL (H)).   ? ?Allergies  ?Allergen Reactions  ? Aspirin Nausea And Vomiting  ? Eggs Or Egg-Derived Products Swelling  ? Other Rash  ?  Pt states she is allergic to eggs and flu vaccine - swelling  ? ?  ? ?Thank you for allowing pharmacy to be a part of this patient?s care. ? ?Benetta Spar, PharmD, BCPS, BCCP ?Clinical Pharmacist ? ?Please check AMION for all [facility] phone numbers ?After 10:00 PM, call [place] [phone] ? ?

## 2021-10-09 NOTE — Assessment & Plan Note (Addendum)
Tested positive for COVID 3/5, chest x-ray and inflammatory markers unremarkable ?Patient will continue taking antiviral therapy with Paxlovid to complete 5 days. ?No need for anti inflammatory therapy as this point. ?Plan to follow up as outpatient.  ?

## 2021-10-10 DIAGNOSIS — R072 Precordial pain: Secondary | ICD-10-CM | POA: Diagnosis not present

## 2021-10-10 DIAGNOSIS — U071 COVID-19: Secondary | ICD-10-CM | POA: Diagnosis not present

## 2021-10-10 DIAGNOSIS — Z6841 Body Mass Index (BMI) 40.0 and over, adult: Secondary | ICD-10-CM | POA: Diagnosis not present

## 2021-10-10 DIAGNOSIS — I10 Essential (primary) hypertension: Secondary | ICD-10-CM | POA: Diagnosis not present

## 2021-10-10 DIAGNOSIS — I5033 Acute on chronic diastolic (congestive) heart failure: Secondary | ICD-10-CM | POA: Diagnosis not present

## 2021-10-10 LAB — CBC
HCT: 37.2 % (ref 36.0–46.0)
Hemoglobin: 12.1 g/dL (ref 12.0–15.0)
MCH: 29.4 pg (ref 26.0–34.0)
MCHC: 32.5 g/dL (ref 30.0–36.0)
MCV: 90.3 fL (ref 80.0–100.0)
Platelets: 274 10*3/uL (ref 150–400)
RBC: 4.12 MIL/uL (ref 3.87–5.11)
RDW: 14.6 % (ref 11.5–15.5)
WBC: 4.8 10*3/uL (ref 4.0–10.5)
nRBC: 0 % (ref 0.0–0.2)

## 2021-10-10 LAB — BASIC METABOLIC PANEL
Anion gap: 8 (ref 5–15)
BUN: 21 mg/dL — ABNORMAL HIGH (ref 6–20)
CO2: 29 mmol/L (ref 22–32)
Calcium: 8.3 mg/dL — ABNORMAL LOW (ref 8.9–10.3)
Chloride: 93 mmol/L — ABNORMAL LOW (ref 98–111)
Creatinine, Ser: 0.97 mg/dL (ref 0.44–1.00)
GFR, Estimated: >60 mL/min (ref >60)
Glucose, Bld: 114 mg/dL — ABNORMAL HIGH (ref 70–99)
Potassium: 3.8 mmol/L (ref 3.5–5.1)
Sodium: 130 mmol/L — ABNORMAL LOW (ref 135–145)

## 2021-10-10 LAB — COLOGUARD: COLOGUARD: NEGATIVE

## 2021-10-10 MED ORDER — FUROSEMIDE 40 MG PO TABS
80.0000 mg | ORAL_TABLET | Freq: Every day | ORAL | Status: DC
  Administered 2021-10-10 – 2021-10-11 (×2): 80 mg via ORAL
  Filled 2021-10-10: qty 2

## 2021-10-10 NOTE — Progress Notes (Signed)
? ?Progress Note ? ?Patient Name: Christina Dominguez ?Date of Encounter: 10/10/2021 ? ?[facility] Cardiologist: Shirlee More, MD  ? ?Subjective  ? ?Feeling better.  She has not done any walking yet.  Yesterday she felt a little dizzy when she got up. ? ?Inpatient Medications  ?  ?Scheduled Meds: ? furosemide  80 mg Oral BID  ? heparin  5,000 Units Subcutaneous Q8H  ? mouth rinse  15 mL Mouth Rinse BID  ? nirmatrelvir/ritonavir EUA  3 tablet Oral BID  ? polyethylene glycol  17 g Oral Daily  ? senna-docusate  1 tablet Oral BID  ? sodium chloride flush  3 mL Intravenous Q12H  ? sodium chloride flush  3 mL Intravenous Q12H  ? ?Continuous Infusions: ? sodium chloride    ? ?PRN Meds: ?sodium chloride, acetaminophen, albuterol, guaiFENesin-dextromethorphan, magnesium hydroxide, ondansetron (ZOFRAN) IV, ondansetron **OR** [DISCONTINUED] ondansetron (ZOFRAN) IV, sodium chloride flush, traZODone  ? ?Vital Signs  ?  ?Vitals:  ? 10/09/21 1523 10/09/21 2354 10/10/21 0647 10/10/21 0700  ?BP: 96/71 116/63  132/85  ?Pulse: 81 76  73  ?Resp: '14 16  20  '$ ?Temp: 98 ?F (36.7 ?C) 97.8 ?F (36.6 ?C)  97.8 ?F (36.6 ?C)  ?TempSrc: Oral Axillary  Oral  ?SpO2: 94% 95%  94%  ?Weight:   (!) 165 kg   ?Height:      ? ? ?Intake/Output Summary (Last 24 hours) at 10/10/2021 0911 ?Last data filed at 10/10/2021 0815 ?Gross per 24 hour  ?Intake 840 ml  ?Output 950 ml  ?Net -110 ml  ? ?Last 3 Weights 10/10/2021 10/09/2021 10/08/2021  ?Weight (lbs) 363 lb 12.1 oz 363 lb 15.7 oz 368 lb 2.7 oz  ?Weight (kg) 165 kg 165.1 kg 167 kg  ?   ? ?Telemetry  ?  ?Sinus rhythm.  No events.- Personally Reviewed ? ?ECG  ?  ?N/a - Personally Reviewed ? ?Physical Exam  ? ?VS:  BP 132/85 (BP Location: Left Wrist)   Pulse 73   Temp 97.8 ?F (36.6 ?C) (Oral)   Resp 20   Ht 5' (1.524 m)   Wt (!) 165 kg   SpO2 94%   BMI 71.04 kg/m?  , BMI Body mass index is 71.04 kg/m?. ?GENERAL:  Well appearing ?HEENT: Pupils equal round and reactive, fundi not visualized, oral mucosa  unremarkable ?NECK:  No jugular venous distention, waveform within normal limits, carotid upstroke brisk and symmetric, no bruits, no thyromegaly ?LUNGS:  Diffuse rhonchi ?HEART:  RRR.  PMI not displaced or sustained,S1 and S2 within normal limits, no S3, no S4, no clicks, no rubs, no murmurs ?ABD:  Flat, positive bowel sounds normal in frequency in pitch, no bruits, no rebound, no guarding, no midline pulsatile mass, no hepatomegaly, no splenomegaly ?EXT:  2 plus pulses throughout, no edema, no cyanosis no clubbing ?SKIN:  No rashes no nodules ?NEURO:  Cranial nerves II through XII grossly intact, motor grossly intact throughout ?PSYCH:  Cognitively intact, oriented to person place and time ? ? ?Labs  ?  ?High Sensitivity Troponin:   ?Recent Labs  ?Lab 10/03/21 ?2120 10/03/21 ?2326  ?TROPONINIHS 4 3  ?   ?Chemistry ?Recent Labs  ?Lab 10/03/21 ?2120 10/04/21 ?0618 10/08/21 ?0052 10/09/21 ?0108 10/10/21 ?5366  ?NA 135   < > 133* 133* 130*  ?K 3.8   < > 3.9 3.6 3.8  ?CL 96*   < > 92* 93* 93*  ?CO2 30   < > 32 30 29  ?GLUCOSE 92   < >  99 115* 114*  ?BUN 19   < > 21* 22* 21*  ?CREATININE 0.95   < > 1.10* 1.07* 0.97  ?CALCIUM 8.7*   < > 8.3* 8.2* 8.3*  ?PROT 7.8  --   --   --   --   ?ALBUMIN 3.8  --   --   --   --   ?AST 20  --   --   --   --   ?ALT 19  --   --   --   --   ?ALKPHOS 117  --   --   --   --   ?BILITOT 0.4  --   --   --   --   ?GFRNONAA >60   < > 59* >60 >60  ?ANIONGAP 9   < > '9 10 8  '$ ? < > = values in this interval not displayed.  ?  ?Lipids No results for input(s): CHOL, TRIG, HDL, LABVLDL, LDLCALC, CHOLHDL in the last 168 hours.  ?Hematology ?Recent Labs  ?Lab 10/04/21 ?0618 10/05/21 ?0050 10/06/21 ?1640 10/06/21 ?1645 10/10/21 ?9211  ?WBC 7.9 9.0  --   --  4.8  ?RBC 3.82* 3.96  --   --  4.12  ?HGB 11.0* 11.5* 12.6 12.6  12.9 12.1  ?HCT 35.1* 36.4 37.0 37.0  38.0 37.2  ?MCV 91.9 91.9  --   --  90.3  ?MCH 28.8 29.0  --   --  29.4  ?MCHC 31.3 31.6  --   --  32.5  ?RDW 14.9 14.7  --   --  14.6  ?PLT 281  329  --   --  274  ? ?Thyroid No results for input(s): TSH, FREET4 in the last 168 hours.  ?BNP ?Recent Labs  ?Lab 10/03/21 ?2120  ?BNP 46.0  ?  ?DDimer  ?Recent Labs  ?Lab 10/09/21 ?0841  ?DDIMER 0.52*  ?  ? ?Radiology  ?  ?DG CHEST PORT 1 VIEW ? ?Result Date: 10/09/2021 ?CLINICAL DATA:  Dyspnea EXAM: PORTABLE CHEST 1 VIEW COMPARISON:  October 03, 2021 and MRI October 05, 2021 FINDINGS: The cardiac silhouette is unchanged. Again seen is the lobular density extending from the upper posterior mediastinum superior to the hilum previously characterized as a benign cystic lesion on MRI October 05, 2021. Bibasilar opacities are favored to reflect atelectasis and artifact secondary to technique. No visible pleural effusion or pneumothorax. The visualized skeletal structures are unchanged. IMPRESSION: Bibasilar opacities are favored to reflect atelectasis and artifact secondary to technique. Underlying infection is not excluded. Electronically Signed   By: Dahlia Bailiff M.D.   On: 10/09/2021 08:09   ? ?Cardiac Studies  ? ?ECHO: 08/20/2021 ? 1. Left ventricular ejection fraction, by estimation, is 55 to 60%. The  ?left ventricle has normal function. The left ventricle has no regional  ?wall motion abnormalities. There is mild concentric left ventricular  ?hypertrophy. Left ventricular diastolic parameters are consistent with Grade I diastolic dysfunction (impaired relaxation).  ? 2. Right ventricular systolic function is normal. The right ventricular  ?size is normal. Tricuspid regurgitation signal is inadequate for assessing PA pressure.  ? 3. The mitral valve is normal in structure. No evidence of mitral valve regurgitation. No evidence of mitral stenosis.  ? 4. The aortic valve was not well visualized. Aortic valve regurgitation is not visualized. No aortic stenosis is present.  ? 5. The inferior vena cava is normal in size with <50% respiratory  ?variability, suggesting right atrial pressure of 8 mmHg.  ?  ?  Cardiac cath  10/06/2021 ?  ?  ?  The left ventricular systolic function is normal. ?  LV end diastolic pressure is moderately elevated. ?  The left ventricular ejection fraction is 55-65% by visual estimate. ?  There is no aortic valve stenosis. ?  No angiographically apparent coronary artery disease. ?  Hemodynamic findings consistent with moderate pulmonary hypertension. ?  On 2L/min [place] O2:Ao sat 99%, PA sat 71%, PA 53/30, mean PA pressure 43 mm Hg; mean PCWP 24 mm Hg; CO 7.35 L/min; CI 3.04. ?  ?Mild volume overload.  Continue medical therapy.  ? ?Patient Profile  ?   ?56 y.o. female with chronic diastolic heart failure, hypertension, prediabetes, asthma, bipolar disorder, and morbid obesity admitted with acute on chronic diastolic heart failure and pulmonary hypertension. ? ?Assessment & Plan  ?  ?#Acute on chronic diastolic heart failure: ?#Pulm hypertension: ?She is net -7.8 L since admission.  Yesterday she was negative about 500 mL.  Diuretics were held yesterday due to hypotension.  Her ARB has been held as well.  We will increase her home Lasix to 80 mg daily today.  She maintains a negative fluid balance and is able to ambulate without hypotension or dizziness, likely discharge home tomorrow. Moderate pulmonary hypertension on right heart cath.  Mean PA pressure was 43 mmHg.  Cardiac output and index were preserved.  Right atrial pressure was 19.  Continue to monitor sodium and renal function. ? ?#OSA/OHS: ?#Hypercapnic respiratory failure: ?Not previously diagnosed.  Planning for outpatient supplemental oxygen at night for now and will need outpatient sleep study for CPAP/BiPAP therapy.  Diuresis as above. ? ?#Hypertension: ?Irbesartan was held due to low blood pressures.  Resume valsartan on discharge presuming that she maintains normal BP and renal function. ? ?# COVID-19:  ?Neighbor visited and she developed symptoms.  Tested positive in the hospital.  She is high risk and has been started on Paxlovid.  Rhonchi on  exam but her oxygen requirement has not increased. ? ?For questions or updates, please contact [place] ?Please consult [email or web address] for contact info under  ? ?  ?   ?Signed, ?Skeet Latch, MD  ?10/10/2021, 9:11 AM

## 2021-10-10 NOTE — Progress Notes (Signed)
PROGRESS NOTE    GABI MCFATE  XIP:[id number] DOB: 09-24-1965 DOA: 10/03/2021 PCP: Fredirick Lathe, PA-C  Brief Narrative:Narrative56/female with morbid obesity, BMI of 73, chronic diastolic CHF, asthma, depression presented to the ED with worsening dyspnea for the past 3 to 4 days, recently discharged over a month ago after hospitalization for same. -In the ED blood pressure was 150/85, BNP 46, CT chest noted dilation of pulmonary trunk suggestive of pulmonary hypertension and posterior mediastinal/paraspinal lesion -Improving with diuresis -Cardiac cath 3/3 noted no CAD, moderate PAH, mild volume overload -3/5, had symptoms of URI, same evening blood pressure dropped to 70s, with severe orthostasis, given a fluid bolus   Subjective: Feels better today, breathing improving, mild cough, had dizziness yesterday but none today  Assessment and Plan:  * Acute on chronic diastolic (congestive) heart failure (HCC) -18 pound weight gain following recent discharge -Has been taking Lasix 40 Mg daily at baseline -Recent echo 1/23 with preserved EF, grade 1 diastolic dysfunction, normal RV function -Clinically improving with diuresis, she is 7.3L negative -Right heart cath with mildly elevated filling pressures, LHC with normal coronaries -On 3/5 blood pressure dropped to 70s with severe orthostatic symptoms, given 500 mL fluid bolus -BP stable, restarted oral Lasix yesterday -Anticipate need for GDMT as outpatient -Discharge planning, increase activity, Home likely tomorrow per cards  Mediastinal mass 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, neurogenic cyst, or esophageal foregut duplication cyst, no further work-up indicated  COVID-19 Tested positive for COVID 3/5, chest x-ray and inflammatory markers unremarkable -Day 2/5 of Paxlovid  Obstructive sleep apnea - Due for sleep study in March, will need home O2 set up nightly  at discharge  Class 3 severe obesity with serious comorbidity and body mass index (BMI) greater than or equal to 70 in adult [facility]) BMI 73 -Dietitian consult, needs aggressive lifestyle modification -Has recently seen a bariatric surgeon at [facility], supposed to lose 40 pounds prior to gastric sleeve  Dyslipidemia - We will continue statin therapy.  Mild intermittent asthma without complication - Stable, continue albuterol MDI  Essential hypertension - See discussion above, ARB discontinued     DVT prophylaxis: Heparin subcutaneous Code Status: Full code Family Communication: No family at bedside Disposition Plan: Home tomorrow   Consultants:  Cardiology   Procedures: Cardiac cath 10/06/2021       The left ventricular systolic function is normal.   LV end diastolic pressure is moderately elevated.   The left ventricular ejection fraction is 55-65% by visual estimate.   There is no aortic valve stenosis.   No angiographically apparent coronary artery disease.   Hemodynamic findings consistent with moderate pulmonary hypertension.   On 2L/min [place] O2:Ao sat 99%, PA sat 71%, PA 53/30, mean PA pressure 43 mm Hg; mean PCWP 24 mm Hg; CO 7.35 L/min; CI 3.04.   Mild volume overload.  Continue medical therapy    Antimicrobials:    Objective: Vitals:   10/09/21 1523 10/09/21 2354 10/10/21 0647 10/10/21 0700  BP: 96/71 116/63  132/85  Pulse: 81 76  73  Resp: '14 16  20  '$ Temp: 98 F (36.7 C) 97.8 F (36.6 C)  97.8 F (36.6 C)  TempSrc: Oral Axillary  Oral  SpO2: 94% 95%  94%  Weight:   (!) 165 kg   Height:        Intake/Output Summary (Last 24 hours) at 10/10/2021 0953 Last data filed at 10/10/2021 6734 Gross  per 24 hour  Intake 840 ml  Output 950 ml  Net -110 ml   Filed Weights   10/08/21 0526 10/09/21 0430 10/10/21 0647  Weight: (!) 167 kg (!) 165.1 kg (!) 165 kg    Examination:  General exam: Morbidly obese pleasant female sitting up in bed, AAOx3, no  distress appears tired CVS: S1-S2, regular rate rhythm Lungs: Clear bilaterally Abdomen: Soft, nontender, bowel sounds present Extremities: No edema Skin: No rashes Psychiatry:  Mood & affect appropriate.     Data Reviewed:   CBC: Recent Labs  Lab 10/03/21 2120 10/04/21 0618 10/05/21 0050 10/06/21 1640 10/06/21 1645 10/10/21 0043  WBC 10.7* 7.9 9.0  --   --  4.8  NEUTROABS 7.6  --   --   --   --   --   HGB 11.7* 11.0* 11.5* 12.6 12.6   12.9 12.1  HCT 36.4 35.1* 36.4 37.0 37.0   38.0 37.2  MCV 92.4 91.9 91.9  --   --  90.3  PLT 352 281 329  --   --  035   Basic Metabolic Panel: Recent Labs  Lab 10/06/21 0113 10/06/21 1640 10/06/21 1645 10/07/21 0033 10/08/21 0052 10/09/21 0108 10/10/21 0043  NA 133*   < > 139   137 133* 133* 133* 130*  K 3.7   < > 4.2   4.4 3.9 3.9 3.6 3.8  CL 92*  --   --  95* 92* 93* 93*  CO2 31  --   --  28 32 30 29  GLUCOSE 114*  --   --  97 99 115* 114*  BUN 18  --   --  15 21* 22* 21*  CREATININE 1.10*  --   --  1.08* 1.10* 1.07* 0.97  CALCIUM 8.8*  --   --  8.6* 8.3* 8.2* 8.3*   < > = values in this interval not displayed.   GFR: Estimated Creatinine Clearance: 95.4 mL/min (by C-G formula based on SCr of 0.97 mg/dL). Liver Function Tests: Recent Labs  Lab 10/03/21 2120  AST 20  ALT 19  ALKPHOS 117  BILITOT 0.4  PROT 7.8  ALBUMIN 3.8   No results for input(s): LIPASE, AMYLASE in the last 168 hours. No results for input(s): AMMONIA in the last 168 hours. Coagulation Profile: No results for input(s): INR, PROTIME in the last 168 hours. Cardiac Enzymes: No results for input(s): CKTOTAL, CKMB, CKMBINDEX, TROPONINI in the last 168 hours. BNP (last 3 results) Recent Labs    08/09/21 1651 08/25/21 1050 09/26/21 1138  PROBNP 106 38 115   HbA1C: No results for input(s): HGBA1C in the last 72 hours. CBG: Recent Labs  Lab 10/05/21 0614  GLUCAP 126*   Lipid Profile: No results for input(s): CHOL, HDL, LDLCALC, TRIG,  CHOLHDL, LDLDIRECT in the last 72 hours. Thyroid Function Tests: No results for input(s): TSH, T4TOTAL, FREET4, T3FREE, THYROIDAB in the last 72 hours. Anemia Panel: Recent Labs    10/09/21 0841  FERRITIN 221   Urine analysis:    Component Value Date/Time   COLORURINE YELLOW 08/18/2021 [place] 08/18/2021 1253   LABSPEC 1.010 08/18/2021 1253   PHURINE 5.5 08/18/2021 1253   GLUCOSEU NEGATIVE 08/18/2021 1253   HGBUR TRACE (A) 08/18/2021 1253   BILIRUBINUR NEGATIVE 08/18/2021 1253   KETONESUR NEGATIVE 08/18/2021 1253   PROTEINUR NEGATIVE 08/18/2021 1253   NITRITE NEGATIVE 08/18/2021 1253   LEUKOCYTESUR NEGATIVE 08/18/2021 1253   Sepsis Labs: '@LABRCNTIP'$ (procalcitonin:4,lacticidven:4)  ) Recent  Results (from the past 240 hour(s))  Resp Panel by RT-PCR (Flu A&B, Covid) Nasopharyngeal Swab     Status: None   Collection Time: 10/03/21 11:27 PM   Specimen: Nasopharyngeal Swab; Nasopharyngeal(NP) swabs in vial transport medium  Result Value Ref Range Status   SARS Coronavirus 2 by RT PCR NEGATIVE NEGATIVE Final    Comment: (NOTE) SARS-CoV-2 target nucleic acids are NOT DETECTED.  The SARS-CoV-2 RNA is generally detectable in upper respiratory specimens during the acute phase of infection. The lowest concentration of SARS-CoV-2 viral copies this assay can detect is 138 copies/mL. A negative result does not preclude SARS-Cov-2 infection and should not be used as the sole basis for treatment or other patient management decisions. A negative result may occur with  improper specimen collection/handling, submission of specimen other than nasopharyngeal swab, presence of viral mutation(s) within the areas targeted by this assay, and inadequate number of viral copies(<138 copies/mL). A negative result must be combined with clinical observations, patient history, and epidemiological information. The expected result is Negative.  Fact Sheet for Patients:   EntrepreneurPulse.com.au  Fact Sheet for Healthcare Providers:  IncredibleEmployment.be  This test is no t yet approved or cleared by the [place] FDA and  has been authorized for detection and/or diagnosis of SARS-CoV-2 by FDA under an Emergency Use Authorization (EUA). This EUA will remain  in effect (meaning this test can be used) for the duration of the COVID-19 declaration under Section 564(b)(1) of the Act, 21 U.S.C.section 360bbb-3(b)(1), unless the authorization is terminated  or revoked sooner.       Influenza A by PCR NEGATIVE NEGATIVE Final   Influenza B by PCR NEGATIVE NEGATIVE Final    Comment: (NOTE) The Xpert Xpress SARS-CoV-2/FLU/RSV plus assay is intended as an aid in the diagnosis of influenza from Nasopharyngeal swab specimens and should not be used as a sole basis for treatment. Nasal washings and aspirates are unacceptable for Xpert Xpress SARS-CoV-2/FLU/RSV testing.  Fact Sheet for Patients: EntrepreneurPulse.com.au  Fact Sheet for Healthcare Providers: IncredibleEmployment.be  This test is not yet approved or cleared by the [place] FDA and has been authorized for detection and/or diagnosis of SARS-CoV-2 by FDA under an Emergency Use Authorization (EUA). This EUA will remain in effect (meaning this test can be used) for the duration of the COVID-19 declaration under Section 564(b)(1) of the Act, 21 U.S.C. section 360bbb-3(b)(1), unless the authorization is terminated or revoked.  Performed at [facility], [address] [place], [place] [address]   MRSA Next Gen by PCR, Nasal     Status: None   Collection Time: 10/04/21  2:57 AM   Specimen: Nasal Mucosa; Nasal Swab  Result Value Ref Range Status   MRSA by PCR Next Gen NOT DETECTED NOT DETECTED Final    Comment: (NOTE) The GeneXpert MRSA Assay (FDA approved for NASAL specimens only), is one component of  a comprehensive MRSA colonization surveillance program. It is not intended to diagnose MRSA infection nor to guide or monitor treatment for MRSA infections. Test performance is not FDA approved in patients less than 21 years old. Performed at [facility], [place] [address], [place], [place] [address]   Resp Panel by RT-PCR (Flu A&B, Covid) Nasopharyngeal Swab     Status: Abnormal   Collection Time: 10/08/21 10:31 AM   Specimen: Nasopharyngeal Swab; Nasopharyngeal(NP) swabs in vial transport medium  Result Value Ref Range Status   SARS Coronavirus 2 by RT PCR POSITIVE (A) NEGATIVE Final  Comment: (NOTE) SARS-CoV-2 target nucleic acids are DETECTED.  The SARS-CoV-2 RNA is generally detectable in upper respiratory specimens during the acute phase of infection. Positive results are indicative of the presence of the identified virus, but do not rule out bacterial infection or co-infection with other pathogens not detected by the test. Clinical correlation with patient history and other diagnostic information is necessary to determine patient infection status. The expected result is Negative.  Fact Sheet for Patients: EntrepreneurPulse.com.au  Fact Sheet for Healthcare Providers: IncredibleEmployment.be  This test is not yet approved or cleared by the [place] FDA and  has been authorized for detection and/or diagnosis of SARS-CoV-2 by FDA under an Emergency Use Authorization (EUA).  This EUA will remain in effect (meaning this test can be used) for the duration of  the COVID-19 declaration under Section 564(b)(1) of the A ct, 21 U.S.C. section 360bbb-3(b)(1), unless the authorization is terminated or revoked sooner.     Influenza A by PCR NEGATIVE NEGATIVE Final   Influenza B by PCR NEGATIVE NEGATIVE Final    Comment: (NOTE) The Xpert Xpress SARS-CoV-2/FLU/RSV plus assay is intended as an aid in the diagnosis of influenza from  Nasopharyngeal swab specimens and should not be used as a sole basis for treatment. Nasal washings and aspirates are unacceptable for Xpert Xpress SARS-CoV-2/FLU/RSV testing.  Fact Sheet for Patients: EntrepreneurPulse.com.au  Fact Sheet for Healthcare Providers: IncredibleEmployment.be  This test is not yet approved or cleared by the [place] FDA and has been authorized for detection and/or diagnosis of SARS-CoV-2 by FDA under an Emergency Use Authorization (EUA). This EUA will remain in effect (meaning this test can be used) for the duration of the COVID-19 declaration under Section 564(b)(1) of the Act, 21 U.S.C. section 360bbb-3(b)(1), unless the authorization is terminated or revoked.  Performed at [facility], [place] [address], [place], [place] [address]      Radiology Studies: DG CHEST PORT 1 VIEW  Result Date: 10/09/2021 CLINICAL DATA:  Dyspnea EXAM: PORTABLE CHEST 1 VIEW COMPARISON:  October 03, 2021 and MRI October 05, 2021 FINDINGS: The cardiac silhouette is unchanged. Again seen is the lobular density extending from the upper posterior mediastinum superior to the hilum previously characterized as a benign cystic lesion on MRI October 05, 2021. Bibasilar opacities are favored to reflect atelectasis and artifact secondary to technique. No visible pleural effusion or pneumothorax. The visualized skeletal structures are unchanged. IMPRESSION: Bibasilar opacities are favored to reflect atelectasis and artifact secondary to technique. Underlying infection is not excluded. Electronically Signed   By: Dahlia Bailiff M.D.   On: 10/09/2021 08:09     Scheduled Meds:  furosemide  80 mg Oral Daily   heparin  5,000 Units Subcutaneous Q8H   mouth rinse  15 mL Mouth Rinse BID   nirmatrelvir/ritonavir EUA  3 tablet Oral BID   polyethylene glycol  17 g Oral Daily   senna-docusate  1 tablet Oral BID   sodium chloride flush  3 mL Intravenous Q12H    sodium chloride flush  3 mL Intravenous Q12H   Continuous Infusions:  sodium chloride       LOS: 4 days    Time spent:54mn    PDomenic Polite MD [facility]   10/10/2021, 9:53 AM

## 2021-10-11 ENCOUNTER — Telehealth: Payer: Self-pay | Admitting: Cardiology

## 2021-10-11 ENCOUNTER — Other Ambulatory Visit (HOSPITAL_COMMUNITY): Payer: Self-pay

## 2021-10-11 DIAGNOSIS — G4733 Obstructive sleep apnea (adult) (pediatric): Secondary | ICD-10-CM

## 2021-10-11 DIAGNOSIS — J9859 Other diseases of mediastinum, not elsewhere classified: Secondary | ICD-10-CM

## 2021-10-11 DIAGNOSIS — E871 Hypo-osmolality and hyponatremia: Secondary | ICD-10-CM | POA: Diagnosis not present

## 2021-10-11 LAB — BASIC METABOLIC PANEL
Anion gap: 8 (ref 5–15)
BUN: 19 mg/dL (ref 6–20)
CO2: 31 mmol/L (ref 22–32)
Calcium: 9 mg/dL (ref 8.9–10.3)
Chloride: 95 mmol/L — ABNORMAL LOW (ref 98–111)
Creatinine, Ser: 1.02 mg/dL — ABNORMAL HIGH (ref 0.44–1.00)
GFR, Estimated: >60 mL/min (ref >60)
Glucose, Bld: 102 mg/dL — ABNORMAL HIGH (ref 70–99)
Potassium: 4.8 mmol/L (ref 3.5–5.1)
Sodium: 134 mmol/L — ABNORMAL LOW (ref 135–145)

## 2021-10-11 MED ORDER — GUAIFENESIN-DM 100-10 MG/5ML PO SYRP
5.0000 mL | ORAL_SOLUTION | Freq: Four times a day (QID) | ORAL | 0 refills | Status: DC | PRN
  Filled 2021-10-11: qty 237, 12d supply, fill #0

## 2021-10-11 MED ORDER — FUROSEMIDE 80 MG PO TABS
80.0000 mg | ORAL_TABLET | Freq: Every day | ORAL | 0 refills | Status: DC
  Filled 2021-10-11: qty 30, 30d supply, fill #0

## 2021-10-11 MED ORDER — NIRMATRELVIR/RITONAVIR (PAXLOVID)TABLET
3.0000 | ORAL_TABLET | Freq: Two times a day (BID) | ORAL | 0 refills | Status: AC
  Filled 2021-10-11: qty 18, 3d supply, fill #0

## 2021-10-11 MED ORDER — POTASSIUM CHLORIDE CRYS ER 20 MEQ PO TBCR
20.0000 meq | EXTENDED_RELEASE_TABLET | Freq: Every day | ORAL | Status: DC
  Administered 2021-10-11: 20 meq via ORAL
  Filled 2021-10-11: qty 1

## 2021-10-11 MED ORDER — POTASSIUM CHLORIDE CRYS ER 20 MEQ PO TBCR
20.0000 meq | EXTENDED_RELEASE_TABLET | Freq: Every day | ORAL | 0 refills | Status: DC
  Filled 2021-10-11: qty 30, 30d supply, fill #0

## 2021-10-11 NOTE — TOC Progression Note (Addendum)
Transition of Care (TOC) - Progression Note  ? ? ?Patient Details  ?Name: Christina Dominguez ?MRN: [id number] ?Date of Birth: [birth year] ? ?Transition of Care (TOC) CM/SW Contact  ?Angelita Ingles, RN ?Phone Number:[phone] ? ?10/11/2021, 9:12 AM ? ?Clinical Narrative:    ?Patient has O2 orders. CM has requested O2 saturation screen documentation in order to set up home O2. ? ?1100 Patient ambulated with PT but sats were maintained. Patient does not qualify for Home O2. ? ? ?  ?  ? ?Expected Discharge Plan and Services ?  ?  ?  ?  ?  ?                ?  ?  ?  ?  ?  ?  ?  ?  ?  ?  ? ? ?Social Determinants of Health (SDOH) Interventions ?Food Insecurity Interventions: Intervention Not Indicated ?Financial Strain Interventions: Other (Comment) (May need to consider disability) ?Housing Interventions: Intervention Not Indicated ?Transportation Interventions: Intervention Not Indicated ? ?Readmission Risk Interventions ?No flowsheet data found. ? ?

## 2021-10-11 NOTE — Plan of Care (Signed)
  Problem: Education: Goal: Ability to demonstrate management of disease process will improve Outcome: Progressing Goal: Ability to verbalize understanding of medication therapies will improve Outcome: Progressing Goal: Individualized Educational Video(s) Outcome: Progressing   Problem: Activity: Goal: Capacity to carry out activities will improve Outcome: Progressing   Problem: Cardiac: Goal: Ability to achieve and maintain adequate cardiopulmonary perfusion will improve Outcome: Progressing   Problem: Education: Goal: Understanding of CV disease, CV risk reduction, and recovery process will improve Outcome: Progressing Goal: Individualized Educational Video(s) Outcome: Progressing   Problem: Activity: Goal: Ability to return to baseline activity level will improve Outcome: Progressing   Problem: Cardiovascular: Goal: Ability to achieve and maintain adequate cardiovascular perfusion will improve Outcome: Progressing Goal: Vascular access site(s) Level 0-1 will be maintained Outcome: Progressing   Problem: Health Behavior/Discharge Planning: Goal: Ability to safely manage health-related needs after discharge will improve Outcome: Progressing   

## 2021-10-11 NOTE — Evaluation (Signed)
Physical Therapy Evaluation ?Patient Details ?Name: Christina Dominguez ?MRN: [id number] ?DOB: 1966-05-11 ?Today's Date: 10/11/2021 ? ?History of Present Illness ? Pt is a 56 y.o. M who presents 10/03/2021 wtih worsening dyspnea. Cardiac cath 3/3 noted no CAD, moderate PAH, mild volume overload. 3/5 had symptoms of URI and BP dropped to 70's. Tested positive for COVID 3/5. Significant PMH: morbid obesity, chronic diastolic CHF, asthma, depression.  ?Clinical Impression ? Pt admitted with above. Pt appears fairly close to her functional baseline. Requiring min assist for transfers from low surface and ambulating 800 feet with a walker without physical assist. SpO2 90-99% on RA, HR 80-90's. Pt presents with decreased cardiopulmonary endurance and mild balance deficits. Education provided regarding activity recommendations; pt states being eager to return home. Will continue to follow acutely. ?   ? ?Recommendations for follow up therapy are one component of a multi-disciplinary discharge planning process, led by the attending physician.  Recommendations may be updated based on patient status, additional functional criteria and insurance authorization. ? ?Follow Up Recommendations No PT follow up ? ?  ?Assistance Recommended at Discharge PRN  ?Patient can return home with the following ? A little help with walking and/or transfers;A little help with bathing/dressing/bathroom;Help with stairs or ramp for entrance ? ?  ?Equipment Recommendations None recommended by PT  ?Recommendations for Other Services ?    ?  ?Functional Status Assessment Patient has had a recent decline in their functional status and demonstrates the ability to make significant improvements in function in a reasonable and predictable amount of time.  ? ?  ?Precautions / Restrictions Precautions ?Precautions: Fall ?Restrictions ?Weight Bearing Restrictions: No  ? ?  ? ?Mobility ? Bed Mobility ?  ?  ?  ?  ?  ?  ?  ?General bed mobility comments: OOB in  chair ?  ? ?Transfers ?Overall transfer level: Needs assistance ?Equipment used: None ?Transfers: Sit to/from Stand ?Sit to Stand: Min assist ?  ?  ?  ?  ?  ?General transfer comment: increased time to rise, use of momentum, minA from recliner ?  ? ?Ambulation/Gait ?Ambulation/Gait assistance: Supervision, Min guard ?Gait Distance (Feet): 800 Feet ?Assistive device: Rolling walker (2 wheels) ?Gait Pattern/deviations: Step-through pattern, Decreased stride length, Wide base of support ?Gait velocity: decreased ?Gait velocity interpretation: <1.8 ft/sec, indicate of risk for recurrent falls ?  ?General Gait Details: Pt taking several short standing rest breaks, supervision-min guard for safety, increased lateral sway ? ?Stairs ?  ?  ?  ?  ?  ? ?Wheelchair Mobility ?  ? ?Modified Rankin (Stroke Patients Only) ?  ? ?  ? ?Balance Overall balance assessment: Needs assistance ?Sitting-balance support: Feet supported ?Sitting balance-Leahy Scale: Good ?  ?  ?Standing balance support: No upper extremity supported, During functional activity ?Standing balance-Leahy Scale: Fair ?  ?  ?  ?  ?  ?  ?  ?  ?  ?  ?  ?  ?   ? ? ? ?Pertinent Vitals/Pain Pain Assessment ?Pain Assessment: No/denies pain  ? ? ?Home Living Family/patient expects to be discharged to:: Private residence ?Living Arrangements: Alone ?  ?Type of Home: Apartment ?Home Access: Stairs to enter ?  ?Entrance Stairs-Number of Steps: "a couple" ?  ?Home Layout: One level ?Home Equipment: Cane - single Barista (2 wheels) ?   ?  ?Prior Function Prior Level of Function : Independent/Modified Independent ?  ?  ?  ?  ?  ?  ?  ?  ?  ? ? ?  Hand Dominance  ?   ? ?  ?Extremity/Trunk Assessment  ? Upper Extremity Assessment ?Upper Extremity Assessment: Overall WFL for tasks assessed ?  ? ?Lower Extremity Assessment ?Lower Extremity Assessment: Generalized weakness ?  ? ?Cervical / Trunk Assessment ?Cervical / Trunk Assessment: Other exceptions ?Cervical / Trunk  Exceptions: increased body habitus  ?Communication  ? Communication: No difficulties  ?Cognition Arousal/Alertness: Awake/alert ?Behavior During Therapy: [facility] for tasks assessed/performed ?Overall Cognitive Status: Within Functional Limits for tasks assessed ?  ?  ?  ?  ?  ?  ?  ?  ?  ?  ?  ?  ?  ?  ?  ?  ?  ?  ?  ? ?  ?General Comments   ? ?  ?Exercises    ? ?Assessment/Plan  ?  ?PT Assessment Patient needs continued PT services  ?PT Problem List Decreased strength;Decreased activity tolerance;Decreased balance;Decreased mobility;Obesity ? ?   ?  ?PT Treatment Interventions DME instruction;Gait training;Stair training;Functional mobility training;Therapeutic activities;Therapeutic exercise;Balance training;Patient/family education   ? ?PT Goals (Current goals can be found in the Care Plan section)  ?Acute Rehab PT Goals ?Patient Stated Goal: go home ?PT Goal Formulation: With patient ?Time For Goal Achievement: 10/25/21 ?Potential to Achieve Goals: Good ? ?  ?Frequency Min 3X/week ?  ? ? ?Co-evaluation   ?  ?  ?  ?  ? ? ?  ?AM-PAC PT "6 Clicks" Mobility  ?Outcome Measure Help needed turning from your back to your side while in a flat bed without using bedrails?: None ?Help needed moving from lying on your back to sitting on the side of a flat bed without using bedrails?: A Little ?Help needed moving to and from a bed to a chair (including a wheelchair)?: A Little ?Help needed standing up from a chair using your arms (e.g., wheelchair or bedside chair)?: A Little ?Help needed to walk in hospital room?: A Little ?Help needed climbing 3-5 steps with a railing? : A Lot ?6 Click Score: 18 ? ?  ?End of Session   ?Activity Tolerance: Patient tolerated treatment well ?Patient left: in chair;with call bell/phone within reach ?Nurse Communication: Mobility status ?PT Visit Diagnosis: Unsteadiness on feet (R26.81);Difficulty in walking, not elsewhere classified (R26.2) ?  ? ?Time: 7824-2353 ?PT Time Calculation (min) (ACUTE  ONLY): 25 min ? ? ?Charges:   PT Evaluation ?$PT Eval Moderate Complexity: 1 Mod ?PT Treatments ?$Therapeutic Activity: 8-22 mins ?  ?   ? ? ?Wyona Almas, PT, DPT ?Acute Rehabilitation Services ?Pager [phone] ?Office [phone] ? ? ?Deno Etienne ?10/11/2021, 11:48 AM ? ?

## 2021-10-11 NOTE — Telephone Encounter (Signed)
Patient called stating Dr. Raliegh Ip wanted to send her for a sleep study, she has heard anything yet about scheduling it.  ?

## 2021-10-11 NOTE — Telephone Encounter (Signed)
Attempted to contact patient but there was no answer and VM was full. ?

## 2021-10-11 NOTE — Discharge Summary (Signed)
Physician Discharge Summary   Patient: Christina Dominguez MRN: [id number] DOB: Feb 26, 1966  Admit date:     10/03/2021  Discharge date: 10/11/21  Discharge Physician: Jimmy Picket Twinkle Sockwell   PCP: Fredirick Lathe, PA-C   Recommendations at discharge:    Continue Paxlovid for 3 more days. Patient will have ambulatory oxymetry on room air before her discharge.  Increased furosemide to 80 mg daily. Increased KCL to 20 meq daily Follow up renal function and electrolytes in 7 days.   Discharge Diagnoses: Principal Problem:   Acute on chronic diastolic (congestive) heart failure (HCC) Active Problems:   Mediastinal mass   Obstructive sleep apnea   COVID-19   Class 3 severe obesity with serious comorbidity and body mass index (BMI) greater than or equal to 70 in adult [facility])   Essential hypertension   Mild intermittent asthma without complication   Dyslipidemia  Resolved Problems:   * No resolved hospital problems. [facility] Course: Christina Dominguez was admitted to the hospital with the working diagnosis of decompensated heart failure.   56 yo female with the past medical history of obesity class 3, diastolic heart failure, asthma, bipolar and depression who presented with dyspnea. Reported worsening dyspnea for 2 weeks, associated with orthopnea, PND and lower extremity edema. 18 lbs weigh gain over last 6 weeks. Recent hospitalization for heart failure exacerbation (08/18/21 to 08/21/21). On her initial physical examination her blood pressure was 137/79, HR 72, temp 97.7., rr 20 and 02 saturation 96%, decreased breath sounds at bases, heart with S1 and S2 present and rhythmic, abdomen protuberant and positive lower extremity edema.   Na 139, K 3,6, CL 97, bicarbonate 31, glucose 96, bun 14, cr 0,94 Wbc 9.0, hgb 11.5 hct 36.4 plt 329 SARS covid 19 negative   Chest radiograph hypo inflated with cardiomegaly, bilateral lower loves interstitial infiltrates and bilateral hilar vascular  congestion.  CT chest with faint bilateral ground glass opacities. No pulmonary embolism. Enlarged pulmonary artery. Ovoid posterior mediastinal/paraspinal lesion (outpatient MRI recommended).   EKG 73 bpm, normal axis and normal intervals, sinus rhythm, with q wave III and Avf, with no ST segment or significant T wave changes.   Patient was admitted to the cardiac ward and placed on aggressive diuresis with furosemide.   03/03 cardiac catheterization with no coronary artery disease, moderate pulmonary hypertension and positive volume overload.   03/05 upper respiratory symptoms, tested positive for COVID 19.  Required IV fluids for orthostatic symptoms.  Started on Paxlovid   Patient has responded well to medical therapy and will be discharge home with outpatient follow up.   Assessment and Plan: * Acute on chronic diastolic (congestive) heart failure (HCC) Pulmonary hypertension with acute on chronic core pulmonale.  Patient was admitted to the cardiac ward, placed on aggressive diuresis, negative fluid balance was achieved, -7,235 ml since admission with improvement of her symptoms.   Echocardiogram from 1/23 with preserved LV EF 55 to 82%, with RV systolic function is preserved. Trivial pericardial effusion.   Further work up with right and left heart catheterization showed preserved LV systolic function, no aortic stenosis, no coronary artery disease. PA 53/30, with mean 43.  PVWP 24  CO 7.35 with CI 3.0  Patient will be discharge home with increase dose of furosemide and continue with valsartan. Possible addition of B blocker as outpatient if blood pressure allows.   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 No need for anti inflammatory therapy as this point. Plan to follow up as outpatient.   Obstructive sleep apnea Patient will need sleep study as outpatient. Before her discharge will check oxymetry on room air on ambulation.   Class 3 severe obesity with serious comorbidity and body mass index (BMI) greater than or equal to 70 in adult [facility]) BMI 73 Has recently seen a bariatric surgeon at [facility], supposed to lose 40 pounds prior to gastric sleeve. Plan for close follow up as outpatient.   Essential hypertension Blood pressure has improved after episodic hypotension during aggressive diuresis.  Her discharge blood pressure is 150/92  Plan to resume valsartan at her discharge and follow up as outpatient.   Mild intermittent asthma without complication No signs of acute exacerbation, continue albuterol MDI  Dyslipidemia Continue with statin therapy.   Hyponatremia Hypokalemia.  Likely related with aggressive diuresis, her discharge Na is 130, plan to follow up as outpatient. Continue diuresis with furosemide.  Discharge K is 3,8, plan to continue Kcl supplements 20 meq daily. Follow up as outpatient renal function and electrolytes.           Consultants: cardiology  Procedures performed: cardiac catheterization right and left   Disposition: Home Diet recommendation:  Cardiac diet DISCHARGE MEDICATION: Allergies as of 10/11/2021       Reactions   Aspirin Nausea And Vomiting   Eggs Or Egg-derived Products Swelling   Other Rash   Pt states she is allergic to eggs and flu vaccine - swelling        Medication List     STOP taking these medications    potassium chloride 10 MEQ tablet Commonly known as: KLOR-CON       TAKE these medications    acetaminophen 650 MG CR tablet Commonly known as: TYLENOL Take 1,300 mg by mouth daily.   albuterol 108 (90 Base) MCG/ACT inhaler Commonly known as:  VENTOLIN HFA Inhale 1 puff into the lungs every 4 (four) hours as needed for wheezing or shortness of breath.   atorvastatin 40 MG tablet Commonly known as: LIPITOR Take 1 tablet (40 mg total) by mouth daily.   CATS CLAW (UNCARIA TOMENTOSA) PO Take 2 capsules by mouth daily. Unknown strenght   diclofenac Sodium 1 % Gel Commonly known as: VOLTAREN Apply 2 g topically daily as needed (pain).   furosemide 80 MG tablet Commonly known as: LASIX Take 1 tablet (80 mg total) by mouth daily. What changed:  medication strength how much to take   guaiFENesin-dextromethorphan 100-10 MG/5ML syrup Commonly known as: ROBITUSSIN DM Take 5 mLs by mouth every 6 (six) hours as needed for cough.   nirmatrelvir/ritonavir EUA 20 x 150 MG & 10 x '100MG'$  Tabs Commonly known as: PAXLOVID Take 3 tablets by mouth 2 (two) times daily for 3 days. Patient GFR is >30. Take nirmatrelvir (150 mg) two tablets twice daily for 5 days and ritonavir (100 mg) one tablet twice daily for 5 days.   potassium chloride SA 20 MEQ tablet Commonly known as: KLOR-CON M Take 1 tablet (20 mEq total) by mouth daily.   valsartan 40 MG tablet Commonly known as: Diovan Take 1 tablet (40 mg total) by mouth daily.  Durable Medical Equipment  (From admission, onward)           Start     Ordered   10/10/21 0954  For home use only DME oxygen  Once       Question Answer Comment  Length of Need 6 Months   Mode or (Route) Nasal cannula   Frequency Only at night (stationary unit needed)   Oxygen conserving device Yes   Oxygen delivery system Gas      10/10/21 0954            Follow-up Information     Allwardt, Alyssa M, PA-C Follow up in 1 week(s).   Specialty: Physician Assistant Contact information: [place] [phone]                Discharge Exam: Danley Danker Weights   10/09/21 0430 10/10/21 0647 10/11/21 0716  Weight: (!) 165.1 kg (!) 165 kg (!)  164.1 kg   BP (!) 150/92 (BP Location: Left Arm)    Pulse 88    Temp 97.8 F (36.6 C) (Oral)    Resp 19    Ht '5\' 1"'$  (1.549 m)    Wt (!) 164.1 kg    SpO2 93%    BMI 68.34 kg/m   Patient is feeling better, no dyspnea and improved lower extremity edema.  Neurology awake and alert ENT with no pallor Cardiovascular with S1 and S2 present and rhythmic, no gallops, rubs or murmurs.  No JVD (wide neck) No lower extremity edema.  Respiratory with no wheezing, rales or rhonchi Abdomen soft and non tender, protuberant but not distended.   Condition at discharge: stable  The results of significant diagnostics from this hospitalization (including imaging, microbiology, ancillary and laboratory) are listed below for reference.   Imaging Studies: CT Angio Chest PE W and/or Wo Contrast  Result Date: 10/03/2021 CLINICAL DATA:  Chest pain and shortness of breath. EXAM: CT ANGIOGRAPHY CHEST WITH CONTRAST TECHNIQUE: Multidetector CT imaging of the chest was performed using the standard protocol during bolus administration of intravenous contrast. Multiplanar CT image reconstructions and MIPs were obtained to evaluate the vascular anatomy. RADIATION DOSE REDUCTION: This exam was performed according to the departmental dose-optimization program which includes automated exposure control, adjustment of the mA and/or kV according to patient size and/or use of iterative reconstruction technique. CONTRAST:  185m OMNIPAQUE IOHEXOL 350 MG/ML SOLN COMPARISON:  Chest radiograph dated 10/03/2021. FINDINGS: Evaluation is limited due to artifact caused by body habitus. Cardiovascular: There is no cardiomegaly or pericardial effusion. The thoracic aorta is unremarkable. The origins of the great vessels of the aortic arch appear patent. Evaluation of the pulmonary arteries is very limited due to suboptimal opacification and timing of the contrast as well as secondary to artifact caused by body habitus. No large or central  pulmonary artery embolus identified. There is dilatation of the main pulmonary trunk suggestive of pulmonary hypertension. Mediastinum/Nodes: No hilar or mediastinal adenopathy. The esophagus and thyroid gland are grossly unremarkable. No mediastinal fluid collection. There is a 4.5 x 3.0 cm ovoid structure in the posterior mediastinum along the spine at T4-T5 on the right. This lesion is not characterized on this CT but may represent a neurogenic cyst, or an enteric duplication cyst arising from the esophagus. Other etiologies include a neurogenic tumor such as a schwannoma or neurofibroma. Further characterization with MRI without and with contrast on a nonemergent/outpatient basis recommended. Lungs/Pleura: The lungs are clear. There is no pleural effusion or  pneumothorax. The central airways are patent. Upper Abdomen: No acute abnormality. Musculoskeletal: Degenerative changes of the spine. No acute osseous pathology. Review of the MIP images confirms the above findings. IMPRESSION: 1. No acute intrathoracic pathology. No large or central pulmonary artery embolus identified. 2. Dilatation of the main pulmonary trunk suggestive of pulmonary hypertension. 3. Ovoid posterior mediastinal/paraspinal lesion as above. Further characterization with MRI without and with contrast on a nonemergent/outpatient basis recommended. Electronically Signed   By: Anner Crete M.D.   On: 10/03/2021 23:24   MR CHEST W WO CONTRAST  Result Date: 10/05/2021 CLINICAL DATA:  Paraspinous mass incidentally identified by prior CT EXAM: MRI CHEST WITHOUT AND WITH CONTRAST TECHNIQUE: Multisequence, multiplanar MR examination of the chest was performed both prior to and following the uncomplicated intravenous administration of gadolinium contrast. CONTRAST:  1m GADAVIST GADOBUTROL 1 MMOL/ML IV SOLN COMPARISON:  CT chest angiogram, 10/03/2021 FINDINGS: Cardiovascular: Normal heart size. Gross enlargement of the main pulmonary artery, at  least 4.3 cm in caliber. Mediastinum: No lymphadenopathy. There is a paraspinous oval, fluid signal cystic lesion overlying the right aspect of T4-T5, which appears to arise from the pleura or neural foramen and is without associated solid component or contrast enhancement (series 9, image 13). Limited lungs: Unremarkable. Upper abdomen: Unremarkable. Chest wall and musculoskeletal: Unremarkable: IMPRESSION: 1. Paraspinous cystic lesion overlying the right aspect of T4-T5, which appears to arise from the pleura or neural foramen and is without associated solid component or contrast enhancement. This is consistent with a definitively benign pleural cyst, neurogenic cyst, or esophageal foregut duplication cyst. No further follow-up or characterization is required. 2. Gross enlargement of the main pulmonary artery, as can be seen in pulmonary hypertension. Electronically Signed   By: ADelanna AhmadiM.D.   On: 10/05/2021 10:00   CARDIAC CATHETERIZATION  Result Date: 10/06/2021   The left ventricular systolic function is normal.   LV end diastolic pressure is moderately elevated.   The left ventricular ejection fraction is 55-65% by visual estimate.   There is no aortic valve stenosis.   No angiographically apparent coronary artery disease.   Hemodynamic findings consistent with moderate pulmonary hypertension.   On 2L/min [place] O2:Ao sat 99%, PA sat 71%, PA 53/30, mean PA pressure 43 mm Hg; mean PCWP 24 mm Hg; CO 7.35 L/min; CI 3.04. Mild volume overload.  Continue medical therapy.   DG CHEST PORT 1 VIEW  Result Date: 10/09/2021 CLINICAL DATA:  Dyspnea EXAM: PORTABLE CHEST 1 VIEW COMPARISON:  October 03, 2021 and MRI October 05, 2021 FINDINGS: The cardiac silhouette is unchanged. Again seen is the lobular density extending from the upper posterior mediastinum superior to the hilum previously characterized as a benign cystic lesion on MRI October 05, 2021. Bibasilar opacities are favored to reflect atelectasis and artifact  secondary to technique. No visible pleural effusion or pneumothorax. The visualized skeletal structures are unchanged. IMPRESSION: Bibasilar opacities are favored to reflect atelectasis and artifact secondary to technique. Underlying infection is not excluded. Electronically Signed   By: JDahlia BailiffM.D.   On: 10/09/2021 08:09   DG Chest Portable 1 View  Result Date: 10/03/2021 CLINICAL DATA:  Left upper extremity pain, shortness of breath for 3 days EXAM: PORTABLE CHEST 1 VIEW COMPARISON:  08/18/2021 FINDINGS: Single frontal view of the chest was obtained, limited by portable technique and body habitus. Cardiac silhouette is stable. No airspace disease, effusion, or pneumothorax. No acute bony abnormalities. IMPRESSION: 1. Unremarkable exam limited by portable technique and body habitus.  Electronically Signed   By: Randa Ngo M.D.   On: 10/03/2021 21:02   VAS [place] LOWER EXTREMITY VENOUS (DVT)  Result Date: 10/04/2021  Lower Venous DVT Study Patient Name:  STACIA FEAZELL  Date of Exam:   10/04/2021 Medical Rec #: [id number]           Accession #:    [id number] Date of Birth: [birth year]            Patient Gender: F Patient Age:   60 years Exam Location:  [facility] Procedure:      VAS [place] LOWER EXTREMITY VENOUS (DVT) Referring Phys: CALLIE GOODRICH --------------------------------------------------------------------------------  Indications: Swelling, and Edema.  Limitations: Body habitus and poor ultrasound/tissue interface. Comparison Study: no prior Performing Technologist: Archie Patten RVS  Examination Guidelines: A complete evaluation includes B-mode imaging, spectral Doppler, color Doppler, and power Doppler as needed of all accessible portions of each vessel. Bilateral testing is considered an integral part of a complete examination. Limited examinations for reoccurring indications may be performed as noted. The reflux portion of the exam is performed with the patient in reverse  Trendelenburg.  +-----+---------------+---------+-----------+----------+--------------+  RIGHT Compressibility Phasicity Spontaneity Properties Thrombus Aging  +-----+---------------+---------+-----------+----------+--------------+  CFV   Full            Yes       Yes                                    +-----+---------------+---------+-----------+----------+--------------+   +---------+---------------+---------+-----------+----------+--------------+  LEFT      Compressibility Phasicity Spontaneity Properties Thrombus Aging  +---------+---------------+---------+-----------+----------+--------------+  CFV       Full            Yes       Yes                                    +---------+---------------+---------+-----------+----------+--------------+  SFJ       Full                                                             +---------+---------------+---------+-----------+----------+--------------+  FV Prox   Full                                                             +---------+---------------+---------+-----------+----------+--------------+  FV Mid    Full                                                             +---------+---------------+---------+-----------+----------+--------------+  FV Distal Full                                                             +---------+---------------+---------+-----------+----------+--------------+  PFV       Full                                                             +---------+---------------+---------+-----------+----------+--------------+  POP       Full            Yes       Yes                                    +---------+---------------+---------+-----------+----------+--------------+  PTV       Full                                                             +---------+---------------+---------+-----------+----------+--------------+  PERO      Full                                                              +---------+---------------+---------+-----------+----------+--------------+     Summary: RIGHT: - No evidence of common femoral vein obstruction.  LEFT: - There is no evidence of deep vein thrombosis in the lower extremity.  - No cystic structure found in the popliteal fossa.  *See table(s) above for measurements and observations. Electronically signed by Harold Barban MD on 10/04/2021 at 11:21:27 PM.    Final     Microbiology: Results for orders placed or performed during the hospital encounter of 10/03/21  Resp Panel by RT-PCR (Flu A&B, Covid) Nasopharyngeal Swab     Status: None   Collection Time: 10/03/21 11:27 PM   Specimen: Nasopharyngeal Swab; Nasopharyngeal(NP) swabs in vial transport medium  Result Value Ref Range Status   SARS Coronavirus 2 by RT PCR NEGATIVE NEGATIVE Final    Comment: (NOTE) SARS-CoV-2 target nucleic acids are NOT DETECTED.  The SARS-CoV-2 RNA is generally detectable in upper respiratory specimens during the acute phase of infection. The lowest concentration of SARS-CoV-2 viral copies this assay can detect is 138 copies/mL. A negative result does not preclude SARS-Cov-2 infection and should not be used as the sole basis for treatment or other patient management decisions. A negative result may occur with  improper specimen collection/handling, submission of specimen other than nasopharyngeal swab, presence of viral mutation(s) within the areas targeted by this assay, and inadequate number of viral copies(<138 copies/mL). A negative result must be combined with clinical observations, patient history, and epidemiological information. The expected result is Negative.  Fact Sheet for Patients:  EntrepreneurPulse.com.au  Fact Sheet for Healthcare Providers:  IncredibleEmployment.be  This test is no t yet approved or cleared by the [place] FDA and  has been authorized for detection and/or diagnosis of SARS-CoV-2 by FDA  under an Emergency Use Authorization (EUA). This EUA will remain  in effect (meaning this test can be used) for the duration of the COVID-19 declaration under Section 564(b)(1) of the Act,  21 U.S.C.section 360bbb-3(b)(1), unless the authorization is terminated  or revoked sooner.       Influenza A by PCR NEGATIVE NEGATIVE Final   Influenza B by PCR NEGATIVE NEGATIVE Final    Comment: (NOTE) The Xpert Xpress SARS-CoV-2/FLU/RSV plus assay is intended as an aid in the diagnosis of influenza from Nasopharyngeal swab specimens and should not be used as a sole basis for treatment. Nasal washings and aspirates are unacceptable for Xpert Xpress SARS-CoV-2/FLU/RSV testing.  Fact Sheet for Patients: EntrepreneurPulse.com.au  Fact Sheet for Healthcare Providers: IncredibleEmployment.be  This test is not yet approved or cleared by the [place] FDA and has been authorized for detection and/or diagnosis of SARS-CoV-2 by FDA under an Emergency Use Authorization (EUA). This EUA will remain in effect (meaning this test can be used) for the duration of the COVID-19 declaration under Section 564(b)(1) of the Act, 21 U.S.C. section 360bbb-3(b)(1), unless the authorization is terminated or revoked.  Performed at [facility], [address] [place], [place] [address]   MRSA Next Gen by PCR, Nasal     Status: None   Collection Time: 10/04/21  2:57 AM   Specimen: Nasal Mucosa; Nasal Swab  Result Value Ref Range Status   MRSA by PCR Next Gen NOT DETECTED NOT DETECTED Final    Comment: (NOTE) The GeneXpert MRSA Assay (FDA approved for NASAL specimens only), is one component of a comprehensive MRSA colonization surveillance program. It is not intended to diagnose MRSA infection nor to guide or monitor treatment for MRSA infections. Test performance is not FDA approved in patients less than 14 years old. Performed at [facility], [place] [address] [place], [place] [address]   Resp Panel by RT-PCR (Flu A&B, Covid) Nasopharyngeal Swab     Status: Abnormal   Collection Time: 10/08/21 10:31 AM   Specimen: Nasopharyngeal Swab; Nasopharyngeal(NP) swabs in vial transport medium  Result Value Ref Range Status   SARS Coronavirus 2 by RT PCR POSITIVE (A) NEGATIVE Final    Comment: (NOTE) SARS-CoV-2 target nucleic acids are DETECTED.  The SARS-CoV-2 RNA is generally detectable in upper respiratory specimens during the acute phase of infection. Positive results are indicative of the presence of the identified virus, but do not rule out bacterial infection or co-infection with other pathogens not detected by the test. Clinical correlation with patient history and other diagnostic information is necessary to determine patient infection status. The expected result is Negative.  Fact Sheet for Patients: EntrepreneurPulse.com.au  Fact Sheet for Healthcare Providers: IncredibleEmployment.be  This test is not yet approved or cleared by the [place] FDA and  has been authorized for detection and/or diagnosis of SARS-CoV-2 by FDA under an Emergency Use Authorization (EUA).  This EUA will remain in effect (meaning this test can be used) for the duration of  the COVID-19 declaration under Section 564(b)(1) of the A ct, 21 U.S.C. section 360bbb-3(b)(1), unless the authorization is terminated or revoked sooner.     Influenza A by PCR NEGATIVE NEGATIVE Final   Influenza B by PCR NEGATIVE NEGATIVE Final    Comment: (NOTE) The Xpert Xpress SARS-CoV-2/FLU/RSV plus assay is intended as an aid in the diagnosis of influenza from Nasopharyngeal swab specimens and should not be used as a sole basis for treatment. Nasal washings and aspirates are unacceptable for Xpert Xpress SARS-CoV-2/FLU/RSV testing.  Fact Sheet for Patients: EntrepreneurPulse.com.au  Fact Sheet for Healthcare  Providers: IncredibleEmployment.be  This test is not yet approved or cleared by  the [employer] and has been authorized for detection and/or diagnosis of SARS-CoV-2 by FDA under an Emergency Use Authorization (EUA). This EUA will remain in effect (meaning this test can be used) for the duration of the COVID-19 declaration under Section 564(b)(1) of the Act, 21 U.S.C. section 360bbb-3(b)(1), unless the authorization is terminated or revoked.  Performed at [facility], [place] [address], [place], [place] [address]     Labs: CBC: Recent Labs  Lab 10/05/21 0050 10/06/21 1640 10/06/21 1645 10/10/21 0043  WBC 9.0  --   --  4.8  HGB 11.5* 12.6 12.6   12.9 12.1  HCT 36.4 37.0 37.0   38.0 37.2  MCV 91.9  --   --  90.3  PLT 329  --   --  355   Basic Metabolic Panel: Recent Labs  Lab 10/06/21 0113 10/06/21 1640 10/06/21 1645 10/07/21 0033 10/08/21 0052 10/09/21 0108 10/10/21 0043  NA 133*   < > 139   137 133* 133* 133* 130*  K 3.7   < > 4.2   4.4 3.9 3.9 3.6 3.8  CL 92*  --   --  95* 92* 93* 93*  CO2 31  --   --  28 32 30 29  GLUCOSE 114*  --   --  97 99 115* 114*  BUN 18  --   --  15 21* 22* 21*  CREATININE 1.10*  --   --  1.08* 1.10* 1.07* 0.97  CALCIUM 8.8*  --   --  8.6* 8.3* 8.2* 8.3*   < > = values in this interval not displayed.   Liver Function Tests: No results for input(s): AST, ALT, ALKPHOS, BILITOT, PROT, ALBUMIN in the last 168 hours. CBG: Recent Labs  Lab 10/05/21 0614  GLUCAP 126*    Discharge time spent: greater than 30 minutes.  Signed: Tawni Millers, MD [facility] 10/11/2021

## 2021-10-11 NOTE — Hospital Course (Addendum)
Christina Dominguez was admitted to the hospital with the working diagnosis of decompensated heart failure.  ? ?56 yo female with the past medical history of obesity class 3, diastolic heart failure, asthma, bipolar and depression who presented with dyspnea. Reported worsening dyspnea for 2 weeks, associated with orthopnea, PND and lower extremity edema. 18 lbs weigh gain over last 6 weeks. Recent hospitalization for heart failure exacerbation (08/18/21 to 08/21/21). ?On her initial physical examination her blood pressure was 137/79, HR 72, temp 97.7., rr 20 and 02 saturation 96%, decreased breath sounds at bases, heart with S1 and S2 present and rhythmic, abdomen protuberant and positive lower extremity edema.  ? ?Na 139, K 3,6, CL 97, bicarbonate 31, glucose 96, bun 14, cr 0,94 ?Wbc 9.0, hgb 11.5 hct 36.4 plt 329 ?SARS covid 19 negative  ? ?Chest radiograph hypo inflated with cardiomegaly, bilateral lower loves interstitial infiltrates and bilateral hilar vascular congestion. ? ?CT chest with faint bilateral ground glass opacities. No pulmonary embolism. Enlarged pulmonary artery. Ovoid posterior mediastinal/paraspinal lesion (outpatient MRI recommended).  ? ?EKG 73 bpm, normal axis and normal intervals, sinus rhythm, with q wave III and Avf, with no ST segment or significant T wave changes.  ? ?Patient was admitted to the cardiac ward and placed on aggressive diuresis with furosemide.  ? ?03/03 cardiac catheterization with no coronary artery disease, moderate pulmonary hypertension and positive volume overload.  ? ?03/05 upper respiratory symptoms, tested positive for COVID 19.  ?Required IV fluids for orthostatic symptoms.  ?Started on Paxlovid  ? ?Patient has responded well to medical therapy and will be discharge home with outpatient follow up.  ?

## 2021-10-11 NOTE — Assessment & Plan Note (Signed)
Hypokalemia.  ?Likely related with aggressive diuresis, her discharge Na is 130, plan to follow up as outpatient. ?Continue diuresis with furosemide.  ?Discharge K is 3,8, plan to continue Kcl supplements 20 meq daily. ?Follow up as outpatient renal function and electrolytes.  ? ?

## 2021-10-11 NOTE — Progress Notes (Signed)
? ?Progress Note ? ?Patient Name: CHEVELLE COULSON ?Date of Encounter: 10/11/2021 ? ?[facility] Cardiologist: Shirlee More, MD  ? ?Subjective  ? ?Breathing improved. ? ?Inpatient Medications  ?  ?Scheduled Meds: ? furosemide  80 mg Oral Daily  ? heparin  5,000 Units Subcutaneous Q8H  ? mouth rinse  15 mL Mouth Rinse BID  ? nirmatrelvir/ritonavir EUA  3 tablet Oral BID  ? polyethylene glycol  17 g Oral Daily  ? senna-docusate  1 tablet Oral BID  ? sodium chloride flush  3 mL Intravenous Q12H  ? sodium chloride flush  3 mL Intravenous Q12H  ? ?Continuous Infusions: ? sodium chloride    ? ?PRN Meds: ?sodium chloride, acetaminophen, albuterol, guaiFENesin-dextromethorphan, magnesium hydroxide, ondansetron (ZOFRAN) IV, ondansetron **OR** [DISCONTINUED] ondansetron (ZOFRAN) IV, sodium chloride flush, traZODone  ? ?Vital Signs  ?  ?Vitals:  ? 10/10/21 1331 10/10/21 1738 10/10/21 2104 10/11/21 0716  ?BP: 132/72 126/81 129/85 (!) 150/92  ?Pulse: 67 74 86 88  ?Resp: '19 20 20 19  '$ ?Temp: 97.9 ?F (36.6 ?C) 98 ?F (36.7 ?C) 97.8 ?F (36.6 ?C) 97.8 ?F (36.6 ?C)  ?TempSrc: Oral Oral Oral Oral  ?SpO2: 90% 94% 93% 93%  ?Weight:    (!) 164.1 kg  ?Height:    '5\' 1"'$  (1.549 m)  ? ? ?Intake/Output Summary (Last 24 hours) at 10/11/2021 0932 ?Last data filed at 10/11/2021 9373 ?Gross per 24 hour  ?Intake 960 ml  ?Output 700 ml  ?Net 260 ml  ? ?Last 3 Weights 10/11/2021 10/10/2021 10/09/2021  ?Weight (lbs) 361 lb 11.2 oz 363 lb 12.1 oz 363 lb 15.7 oz  ?Weight (kg) 164.066 kg 165 kg 165.1 kg  ?   ? ?Telemetry  ?  ?Sinus rhythm.  No events.- Personally Reviewed ? ?ECG  ?  ?N/a - Personally Reviewed ? ?Physical Exam  ? ?VS:  BP (!) 150/92 (BP Location: Left Arm)   Pulse 88   Temp 97.8 ?F (36.6 ?C) (Oral)   Resp 19   Ht '5\' 1"'$  (1.549 m)   Wt (!) 164.1 kg   SpO2 93%   BMI 68.34 kg/m?  , BMI Body mass index is 68.34 kg/m?. ?GENERAL:  Well appearing ?HEENT: Pupils equal round and reactive, fundi not visualized, oral mucosa unremarkable ?NECK:  No  jugular venous distention, waveform within normal limits, carotid upstroke brisk and symmetric, no bruits, no thyromegaly ?LUNGS:  Diffuse rhonchi ?HEART:  RRR.  PMI not displaced or sustained,S1 and S2 within normal limits, no S3, no S4, no clicks, no rubs, no murmurs ?ABD:  Flat, positive bowel sounds normal in frequency in pitch, no bruits, no rebound, no guarding, no midline pulsatile mass, no hepatomegaly, no splenomegaly ?EXT:  2 plus pulses throughout, no edema, no cyanosis no clubbing ?SKIN:  No rashes no nodules ?NEURO:  Cranial nerves II through XII grossly intact, motor grossly intact throughout ?PSYCH:  Cognitively intact, oriented to person place and time ? ? ?Labs  ?  ?High Sensitivity Troponin:   ?Recent Labs  ?Lab 10/03/21 ?2120 10/03/21 ?2326  ?TROPONINIHS 4 3  ?   ?Chemistry ?Recent Labs  ?Lab 10/08/21 ?4287 10/09/21 ?0108 10/10/21 ?6811  ?NA 133* 133* 130*  ?K 3.9 3.6 3.8  ?CL 92* 93* 93*  ?CO2 32 30 29  ?GLUCOSE 99 115* 114*  ?BUN 21* 22* 21*  ?CREATININE 1.10* 1.07* 0.97  ?CALCIUM 8.3* 8.2* 8.3*  ?GFRNONAA 59* >60 >60  ?ANIONGAP '9 10 8  '$ ?  ?Lipids No results for input(s):  CHOL, TRIG, HDL, LABVLDL, LDLCALC, CHOLHDL in the last 168 hours.  ?Hematology ?Recent Labs  ?Lab 10/05/21 ?0050 10/06/21 ?1640 10/06/21 ?1645 10/10/21 ?1884  ?WBC 9.0  --   --  4.8  ?RBC 3.96  --   --  4.12  ?HGB 11.5* 12.6 12.6  12.9 12.1  ?HCT 36.4 37.0 37.0  38.0 37.2  ?MCV 91.9  --   --  90.3  ?MCH 29.0  --   --  29.4  ?MCHC 31.6  --   --  32.5  ?RDW 14.7  --   --  14.6  ?PLT 329  --   --  274  ? ?Thyroid No results for input(s): TSH, FREET4 in the last 168 hours.  ?BNP ?No results for input(s): BNP, PROBNP in the last 168 hours. ?  ?DDimer  ?Recent Labs  ?Lab 10/09/21 ?0841  ?DDIMER 0.52*  ?  ? ?Radiology  ?  ?No results found. ? ?Cardiac Studies  ? ?ECHO: 08/20/2021 ? 1. Left ventricular ejection fraction, by estimation, is 55 to 60%. The  ?left ventricle has normal function. The left ventricle has no regional  ?wall  motion abnormalities. There is mild concentric left ventricular  ?hypertrophy. Left ventricular diastolic parameters are consistent with Grade I diastolic dysfunction (impaired relaxation).  ? 2. Right ventricular systolic function is normal. The right ventricular  ?size is normal. Tricuspid regurgitation signal is inadequate for assessing PA pressure.  ? 3. The mitral valve is normal in structure. No evidence of mitral valve regurgitation. No evidence of mitral stenosis.  ? 4. The aortic valve was not well visualized. Aortic valve regurgitation is not visualized. No aortic stenosis is present.  ? 5. The inferior vena cava is normal in size with <50% respiratory  ?variability, suggesting right atrial pressure of 8 mmHg.  ?  ?Cardiac cath 10/06/2021 ?  ?  ?  The left ventricular systolic function is normal. ?  LV end diastolic pressure is moderately elevated. ?  The left ventricular ejection fraction is 55-65% by visual estimate. ?  There is no aortic valve stenosis. ?  No angiographically apparent coronary artery disease. ?  Hemodynamic findings consistent with moderate pulmonary hypertension. ?  On 2L/min [place] O2:Ao sat 99%, PA sat 71%, PA 53/30, mean PA pressure 43 mm Hg; mean PCWP 24 mm Hg; CO 7.35 L/min; CI 3.04. ?  ?Mild volume overload.  Continue medical therapy.  ? ?Patient Profile  ?   ?56 y.o. female with chronic diastolic heart failure, hypertension, prediabetes, asthma, bipolar disorder, and morbid obesity admitted with acute on chronic diastolic heart failure and pulmonary hypertension. ? ?Assessment & Plan  ?  ?#Acute on chronic diastolic heart failure: ?#Pulm hypertension: ?She is net -6.8 L since admission.  Her home lasix was increased to '80mg'$  dialy.   Moderate pulmonary hypertension on right heart cath.  Mean PA pressure was 43 mmHg.  Cardiac output and index were preserved.  Right atrial pressure was 19.  She needs an outpatient sleep study.  Overnight oxygen for now.  ? ?#OSA/OHS: ?#Hypercapnic  respiratory failure: ?Not previously diagnosed.  Planning for outpatient supplemental oxygen at night for now and will need outpatient sleep study for CPAP/BiPAP therapy.  Diuresis as above. ? ?#Hypertension: ?Irbesartan was held due to low blood pressures.  Resume valsartan on discharge. ? ?# COVID-19:  ?Neighbor visited and she developed symptoms.  Tested positive in the hospital.  She is high risk and has been started on Paxlovid.  [place] on exam but  her oxygen requirement has not increased. ? ?[facility] will sign off.   ?Medication Recommendations:  continue lasix '80mg'$  daily.  Resume valsartan ?Other recommendations (labs, testing, etc):  BMP at follow up ?Follow up as an outpatient:  has been arranged ? ? ?For questions or updates, please contact [place] ?Please consult [email or web address] for contact info under  ? ?  ?   ?Signed, ?Skeet Latch, MD  ?10/11/2021, 9:32 AM   ? ?

## 2021-10-12 ENCOUNTER — Telehealth: Payer: Self-pay | Admitting: *Deleted

## 2021-10-12 ENCOUNTER — Telehealth: Payer: Self-pay

## 2021-10-12 NOTE — Telephone Encounter (Signed)
Transition Care Management Follow-up Telephone Call ?Date of discharge and from where: 10/12/21 ?How have you been since you were released from the hospital? Christina Dominguez  ?Any questions or concerns? Yes questioning if cpap was needed  ? ?Items Reviewed: ?Did the pt receive and understand the discharge instructions provided? Yes  ?Medications obtained and verified? Yes  ?Other? Yes  ?Any new allergies since your discharge? No  ?Dietary orders reviewed? Yes ?Do you have support at home? No  ? ?Home Care and Equipment/Supplies: ?Were home health services ordered? no ?If so, what is the name of the agency?   ?Has the agency set up a time to come to the patient's home? not applicable ?Were any new equipment or medical supplies ordered?  No ?What is the name of the medical supply agency?  ?Were you able to get the supplies/equipment? not applicable ?Do you have any questions related to the use of the equipment or supplies? No ? ?Functional Questionnaire: (I = Independent and D = Dependent) ?ADLs: I ? ?Bathing/Dressing- I ? ?Meal Prep- I ? ?Eating- I ? ?Maintaining continence- I ? ?Transferring/Ambulation- I uses a cane and or walker  ? ?Managing Meds- I ? ?Follow up appointments reviewed: ? ?PCP Hospital f/u appt confirmed? Yes  Scheduled to see Dr Yetta Flock [place]  on 10/19/21 @ 12:00. ?[facility] f/u appt confirmed? Yes  Scheduled to see Vascular and pulmonology  on 3/13, 3/15,  @ 2 pm, 11:30. ?Are transportation arrangements needed? No  ?If their condition worsens, is the pt aware to call PCP or go to the Emergency Dept.? Yes ?Was the patient provided with contact information for the PCP's office or ED? Yes ?Was to pt encouraged to call back with questions or concerns? Yes ? ?

## 2021-10-12 NOTE — Telephone Encounter (Signed)
Advised we are waiting on precert for the sleep study. Pt verbalized understanding and had no additional questions. ?

## 2021-10-12 NOTE — Telephone Encounter (Signed)
Per [employer] no PA required for [place]. CPT code 95800. Call reference # [id number]. Dr Salina April office informed ok to activate PIN. ?

## 2021-10-12 NOTE — Telephone Encounter (Signed)
Noted, thanks!

## 2021-10-16 ENCOUNTER — Encounter (HOSPITAL_COMMUNITY): Payer: Managed Care, Other (non HMO)

## 2021-10-18 ENCOUNTER — Institutional Professional Consult (permissible substitution): Payer: Managed Care, Other (non HMO) | Admitting: Pulmonary Disease

## 2021-10-18 NOTE — Progress Notes (Deleted)
? ?Synopsis: Referred in March 2023 for shortness of breath by Theresa Duty, PA ? ?Subjective:  ? ?PATIENT ID: Christina Dominguez GENDER: female DOB: 02-22-1966, MRN: [id number] ? ? ?HPI ? ?No chief complaint on file. ? ?Christina Dominguez is a 56 year old woman, never smoker with history of hypertension, obesity, CHF and asthma who is referred to pulmonary clinic for shortness of breath.  ? ? ? ?Past Medical History:  ?Diagnosis Date  ? Anxiety   ? Aortic atherosclerosis ([place]) 08/19/2021  ? Asthma   ? Bipolar 1 disorder ([place])   ? CHF (congestive heart failure) ([place])   ? Class 3 severe obesity with serious comorbidity and body mass index (BMI) greater than or equal to 70 in adult [facility]) 12/21/2019  ? Depression   ? Diastolic dysfunction 03/54/6568  ? Diverticulosis 08/19/2021  ? Edema of both lower extremities   ? Hepatic steatosis 08/19/2021  ? Hepatomegaly 08/19/2021  ? High blood pressure   ? Joint pain   ? Prediabetes 12/22/2019  ?  ? ?Family History  ?Problem Relation Age of Onset  ? Heart Problems Mother   ?     Broken heart syndrome  ? Hypertension Mother   ? Hypercholesterolemia Mother   ? Bone cancer Father   ? Testicular cancer Father   ? Heart attack Sister   ? Diabetes Brother   ? Colon cancer Maternal Grandfather   ? Dementia Paternal Grandmother   ?  ? ?Social History  ? ?Socioeconomic History  ? Marital status: Single  ?  Spouse name: Not on file  ? Number of children: Not on file  ? Years of education: Not on file  ? Highest education level: Not on file  ?Occupational History  ? Occupation: Medical illustrator, work from home  ?Tobacco Use  ? Smoking status: Never  ?  Passive exposure: Never  ? Smokeless tobacco: Never  ?Vaping Use  ? Vaping Use: Never used  ?Substance and Sexual Activity  ? Alcohol use: Yes  ?  Alcohol/week: 2.0 standard drinks  ?  Types: 1 Glasses of wine, 1 Standard drinks or equivalent per week  ?  Comment: occ  ? Drug use: Never  ? Sexual activity: Not on file  ?Other Topics Concern  ? Not  on file  ?Social History Narrative  ? Not on file  ? ?Social Determinants of Health  ? ?Financial Resource Strain: Medium Risk  ? Difficulty of Paying Living Expenses: Somewhat hard  ?Food Insecurity: No Food Insecurity  ? Worried About Charity fundraiser in the Last Year: Never true  ? Ran Out of Food in the Last Year: Never true  ?Transportation Needs: No Transportation Needs  ? Lack of Transportation (Medical): No  ? Lack of Transportation (Non-Medical): No  ?Physical Activity: Not on file  ?Stress: Not on file  ?Social Connections: Not on file  ?Intimate Partner Violence: Not on file  ?  ? ?Allergies  ?Allergen Reactions  ? Aspirin Nausea And Vomiting  ? Eggs Or Egg-Derived Products Swelling  ? Other Rash  ?  Pt states she is allergic to eggs and flu vaccine - swelling  ?  ? ?Outpatient Medications Prior to Visit  ?Medication Sig Dispense Refill  ? acetaminophen (TYLENOL) 650 MG CR tablet Take 1,300 mg by mouth daily.    ? albuterol (VENTOLIN HFA) 108 (90 Base) MCG/ACT inhaler Inhale 1 puff into the lungs every 4 (four) hours as needed for wheezing or shortness of breath.    ?  atorvastatin (LIPITOR) 40 MG tablet Take 1 tablet (40 mg total) by mouth daily. (Patient not taking: Reported on 09/26/2021) 30 tablet 0  ? CATS CLAW, UNCARIA TOMENTOSA, PO Take 2 capsules by mouth daily. Unknown strenght    ? diclofenac Sodium (VOLTAREN) 1 % GEL Apply 2 g topically daily as needed (pain).    ? furosemide (LASIX) 80 MG tablet Take 1 tablet (80 mg total) by mouth daily. 30 tablet 0  ? guaiFENesin-dextromethorphan (ROBITUSSIN DM) 100-10 MG/5ML syrup Take 5 mLs by mouth every 6 (six) hours as needed for cough. 237 mL 0  ? potassium chloride SA (KLOR-CON M) 20 MEQ tablet Take 1 tablet (20 mEq total) by mouth daily. 30 tablet 0  ? valsartan (DIOVAN) 40 MG tablet Take 1 tablet (40 mg total) by mouth daily. 90 tablet 3  ? ?No facility-administered medications prior to visit.  ? ? ?ROS ? ? ? ?Objective:  ?There were no vitals  filed for this visit. ? ? ?Physical Exam ? ? ? ?CBC ?   ?Component Value Date/Time  ? WBC 4.8 10/10/2021 0043  ? RBC 4.12 10/10/2021 0043  ? HGB 12.1 10/10/2021 0043  ? HCT 37.2 10/10/2021 0043  ? PLT 274 10/10/2021 0043  ? MCV 90.3 10/10/2021 0043  ? MCH 29.4 10/10/2021 0043  ? MCHC 32.5 10/10/2021 0043  ? RDW 14.6 10/10/2021 0043  ? LYMPHSABS 2.1 10/03/2021 2120  ? MONOABS 0.7 10/03/2021 2120  ? EOSABS 0.2 10/03/2021 2120  ? BASOSABS 0.0 10/03/2021 2120  ? ? ? ?Chest imaging: ? ?PFT: ?No flowsheet data found. ? ?Labs: ? ?Path: ? ?Echo: ? ?Heart Catheterization: ? ? ? ?   ?Assessment & Plan:  ? ?No diagnosis found. ? ?Discussion: ?*** ? ? ? ?Current Outpatient Medications:  ?  acetaminophen (TYLENOL) 650 MG CR tablet, Take 1,300 mg by mouth daily., Disp: , Rfl:  ?  albuterol (VENTOLIN HFA) 108 (90 Base) MCG/ACT inhaler, Inhale 1 puff into the lungs every 4 (four) hours as needed for wheezing or shortness of breath., Disp: , Rfl:  ?  atorvastatin (LIPITOR) 40 MG tablet, Take 1 tablet (40 mg total) by mouth daily. (Patient not taking: Reported on 09/26/2021), Disp: 30 tablet, Rfl: 0 ?  CATS CLAW, UNCARIA TOMENTOSA, PO, Take 2 capsules by mouth daily. Unknown strenght, Disp: , Rfl:  ?  diclofenac Sodium (VOLTAREN) 1 % GEL, Apply 2 g topically daily as needed (pain)., Disp: , Rfl:  ?  furosemide (LASIX) 80 MG tablet, Take 1 tablet (80 mg total) by mouth daily., Disp: 30 tablet, Rfl: 0 ?  guaiFENesin-dextromethorphan (ROBITUSSIN DM) 100-10 MG/5ML syrup, Take 5 mLs by mouth every 6 (six) hours as needed for cough., Disp: 237 mL, Rfl: 0 ?  potassium chloride SA (KLOR-CON M) 20 MEQ tablet, Take 1 tablet (20 mEq total) by mouth daily., Disp: 30 tablet, Rfl: 0 ?  valsartan (DIOVAN) 40 MG tablet, Take 1 tablet (40 mg total) by mouth daily., Disp: 90 tablet, Rfl: 3 ? ? ?

## 2021-10-19 ENCOUNTER — Ambulatory Visit: Payer: Managed Care, Other (non HMO) | Admitting: Physician Assistant

## 2021-10-24 ENCOUNTER — Telehealth: Payer: Self-pay

## 2021-10-24 ENCOUNTER — Encounter: Payer: Self-pay | Admitting: Cardiology

## 2021-10-24 ENCOUNTER — Ambulatory Visit (INDEPENDENT_AMBULATORY_CARE_PROVIDER_SITE_OTHER): Payer: Managed Care, Other (non HMO) | Admitting: Cardiology

## 2021-10-24 ENCOUNTER — Other Ambulatory Visit: Payer: Self-pay

## 2021-10-24 ENCOUNTER — Telehealth: Payer: Self-pay | Admitting: Physician Assistant

## 2021-10-24 VITALS — BP 136/78 | HR 86 | Ht 60.0 in | Wt 368.0 lb

## 2021-10-24 DIAGNOSIS — I5032 Chronic diastolic (congestive) heart failure: Secondary | ICD-10-CM

## 2021-10-24 DIAGNOSIS — Z6841 Body Mass Index (BMI) 40.0 and over, adult: Secondary | ICD-10-CM

## 2021-10-24 DIAGNOSIS — I10 Essential (primary) hypertension: Secondary | ICD-10-CM

## 2021-10-24 DIAGNOSIS — E785 Hyperlipidemia, unspecified: Secondary | ICD-10-CM

## 2021-10-24 DIAGNOSIS — I272 Pulmonary hypertension, unspecified: Secondary | ICD-10-CM | POA: Diagnosis not present

## 2021-10-24 NOTE — Telephone Encounter (Signed)
Patient was seen today by Dr. Agustin Cree. ?

## 2021-10-24 NOTE — Progress Notes (Signed)
?Cardiology Office Note:   ? ?Date:  10/24/2021  ? ?ID:  Christina Dominguez, DOB 05-Jul-1966, MRN [id number] ? ?PCP:  Allwardt, Randa Evens, PA-C  ?Cardiologist:  Jenne Campus, MD   ? ?Referring MD: Fredirick Lathe, PA-C  ? ?Chief Complaint  ?Patient presents with  ? hospital visit   ?  Doing much better   ? ? ?History of Present Illness:   ? ?Christina Dominguez is a 56 y.o. female with past medical history significant for morbid obesity, diastolic congestive heart failure, pulmonary hypertension with mean pulmonary pressure based on cardiac cath of 45 mmHg, prediabetes, essential hypertension.  She is coming to my office today after being discharged from hospital 2 weeks ago.  What prompted visit in the emergency room and hospital was the fact that she became more short of breath she gained weight.  She was found to be in decompensated diastolic congestive heart failure as well as COVID-positive.  She was aggressively diuresed, she urinated more than 7 L of fluid.  She did have a left and right cardiac catheterization cardiac catheterization showed normal coronaries however she did have pulmonary hypertension as well as elevation of the pulmonary wedge pressure however elevation of pulmonary artery pressure was disproportionately high to pulmonary wedge pressure elevation.  Therefore there is some pulmonary component of her pulmonary hypertension as well.  She is coming today doing well.  Weight is much better however she gained 3-1/2 pounds since the time she is being discharged from hospital.  She says she got more energy but still little bit tired after COVID.  She is planning to go back to gym tomorrow.  Denies have any chest pain tightness squeezing pressure burning chest. ? ?Past Medical History:  ?Diagnosis Date  ? Anxiety   ? Aortic atherosclerosis ([place]) 08/19/2021  ? Asthma   ? Bipolar 1 disorder ([place])   ? CHF (congestive heart failure) ([place])   ? Class 3 severe obesity with serious comorbidity and body mass  index (BMI) greater than or equal to 70 in adult [facility]) 12/21/2019  ? Depression   ? Diastolic dysfunction 67/34/1937  ? Diverticulosis 08/19/2021  ? Edema of both lower extremities   ? Hepatic steatosis 08/19/2021  ? Hepatomegaly 08/19/2021  ? High blood pressure   ? Joint pain   ? Prediabetes 12/22/2019  ? ? ?Past Surgical History:  ?Procedure Laterality Date  ? NO PAST SURGERIES    ? RIGHT/LEFT HEART CATH AND CORONARY ANGIOGRAPHY N/A 10/06/2021  ? Procedure: RIGHT/LEFT HEART CATH AND CORONARY ANGIOGRAPHY;  Surgeon: Jettie Booze, MD;  Location: [place] [facility];  Service: Cardiovascular;  Laterality: N/A;  ? ? ?Current Medications: ?Current Meds  ?Medication Sig  ? acetaminophen (TYLENOL) 650 MG CR tablet Take 1,300 mg by mouth daily.  ? albuterol (VENTOLIN HFA) 108 (90 Base) MCG/ACT inhaler Inhale 1 puff into the lungs every 4 (four) hours as needed for wheezing or shortness of breath.  ? atorvastatin (LIPITOR) 40 MG tablet Take 1 tablet (40 mg total) by mouth daily.  ? CATS CLAW, UNCARIA TOMENTOSA, PO Take 2 capsules by mouth daily. Unknown strenght  ? Cholecalciferol (VITAMIN D) 125 MCG (5000 UT) CAPS Take 1 tablet by mouth daily.  ? diclofenac Sodium (VOLTAREN) 1 % GEL Apply 2 g topically daily as needed (pain).  ? furosemide (LASIX) 80 MG tablet Take 1 tablet (80 mg total) by mouth daily.  ? potassium chloride SA (KLOR-CON M) 20 MEQ tablet Take 1 tablet (20 mEq  total) by mouth daily.  ? valsartan (DIOVAN) 40 MG tablet Take 1 tablet (40 mg total) by mouth daily.  ? [DISCONTINUED] guaiFENesin-dextromethorphan (ROBITUSSIN DM) 100-10 MG/5ML syrup Take 5 mLs by mouth every 6 (six) hours as needed for cough.  ?  ? ?Allergies:   Aspirin, Eggs or egg-derived products, and Other  ? ?Social History  ? ?Socioeconomic History  ? Marital status: Single  ?  Spouse name: Not on file  ? Number of children: Not on file  ? Years of education: Not on file  ? Highest education level: Not on file  ?Occupational History  ?  Occupation: Medical illustrator, work from home  ?Tobacco Use  ? Smoking status: Never  ?  Passive exposure: Never  ? Smokeless tobacco: Never  ?Vaping Use  ? Vaping Use: Never used  ?Substance and Sexual Activity  ? Alcohol use: Yes  ?  Alcohol/week: 2.0 standard drinks  ?  Types: 1 Glasses of wine, 1 Standard drinks or equivalent per week  ?  Comment: occ  ? Drug use: Never  ? Sexual activity: Not on file  ?Other Topics Concern  ? Not on file  ?Social History Narrative  ? Not on file  ? ?Social Determinants of Health  ? ?Financial Resource Strain: Medium Risk  ? Difficulty of Paying Living Expenses: Somewhat hard  ?Food Insecurity: No Food Insecurity  ? Worried About Charity fundraiser in the Last Year: Never true  ? Ran Out of Food in the Last Year: Never true  ?Transportation Needs: No Transportation Needs  ? Lack of Transportation (Medical): No  ? Lack of Transportation (Non-Medical): No  ?Physical Activity: Not on file  ?Stress: Not on file  ?Social Connections: Not on file  ?  ? ?Family History: ?The patient's family history includes Bone cancer in her father; Colon cancer in her maternal grandfather; Dementia in her paternal grandmother; Diabetes in her brother; Heart Problems in her mother; Heart attack in her sister; Hypercholesterolemia in her mother; Hypertension in her mother; Testicular cancer in her father. ?ROS:   ?Please see the history of present illness.    ?All 14 point review of systems negative except as described per history of present illness ? ?EKGs/Labs/Other Studies Reviewed:   ? ? ? ?Recent Labs: ?09/26/2021: NT-Pro BNP 115 ?10/03/2021: ALT 19; B Natriuretic Peptide 46.0 ?10/10/2021: Hemoglobin 12.1; Platelets 274 ?10/11/2021: BUN 19; Creatinine, Ser 1.02; Potassium 4.8; Sodium 134  ?Recent Lipid Panel ?   ?Component Value Date/Time  ? CHOL 158 12/21/2019 1350  ? TRIG 58 12/21/2019 1350  ? HDL 58 12/21/2019 1350  ? [place] 88 12/21/2019 1350  ? ? ?Physical Exam:   ? ?VS:  BP 136/78 (BP Location:  Right Arm, Patient Position: Sitting)   Pulse 86   Ht 5' (1.524 m)   Wt (!) 368 lb (166.9 kg)   SpO2 94%   BMI 71.87 kg/m?    ? ?Wt Readings from Last 3 Encounters:  ?10/24/21 (!) 368 lb (166.9 kg)  ?10/11/21 (!) 361 lb 11.2 oz (164.1 kg)  ?09/26/21 (!) 383 lb (173.7 kg)  ?  ? ?GEN:  Well nourished, well developed in no acute distress ?HEENT: Normal ?NECK: No JVD; No carotid bruits ?LYMPHATICS: No lymphadenopathy ?CARDIAC: RRR, no murmurs, no rubs, no gallops ?RESPIRATORY:  Clear to auscultation without rales, wheezing or rhonchi  ?ABDOMEN: Soft, non-tender, non-distended ?MUSCULOSKELETAL:  No edema; No deformity  ?SKIN: Warm and dry ?LOWER EXTREMITIES: no swelling ?NEUROLOGIC:  Alert and oriented  x 3 ?PSYCHIATRIC:  Normal affect  ? ?ASSESSMENT:   ? ?1. Chronic diastolic congestive heart failure ([place])   ?2. Essential hypertension   ?3. Pulmonary hypertension, unspecified ([place])   ?4. Class 3 severe obesity due to excess calories with serious comorbidity and body mass index (BMI) greater than or equal to 70 in adult [facility])   ?5. Dyslipidemia   ? ?PLAN:   ? ?In order of problems listed above: ? ?Chronic diastolic congestive heart failure.  She is on diuretic.  The limits that we had in the hospital was low blood pressure however today blood pressure is reasonable.  I will ask her to have Chem-7 done if Chem-7 is fine we will increase her Lasix probably 220 mg daily.  I am concerned about her weight going up.  I did instructed her to check her weight on the regular basis which she already does. ?Pulmonary hypertension.  Most likely group 2 and group 3.  Partially related to diastolic dysfunctions however her pulmonary pressure is disproportionately higher compared to pulmonary wedge pressure therefore there is some pulmonary component.  That is most likely related to her morbid obesity hypoventilation syndrome as well as possible obstructive sleep apnea.  So we managing diastolic congestive heart failure with  diuretics as described above.  She will be scheduled to have sleep study to address obstructive sleep apnea issues.  We did talk also about need to exercise and lose some weight which she is trying to do. ?Essential

## 2021-10-24 NOTE — Telephone Encounter (Signed)
-----   Message from [place] sent at 10/12/2021  2:59 PM EST ----- ?Dr Raliegh Ip patient ?----- Message ----- ?From: Lowella Grip, CMA ?Sent: 10/11/2021  12:52 PM EST ?To: Toni Arthurs, CMA ? ? ?----- Message ----- ?From: Ledora Bottcher, [place] ?Sent: 10/11/2021  12:28 PM EST ?To: Cv Div Ash/Hp Triage ? ?Pt needs follow up with Dr. Bettina Gavia in the next 2 weeks for hospital follow up. ? ?Thanks ?Angie ? ? ? ? ?

## 2021-10-24 NOTE — Patient Instructions (Signed)
Medication Instructions:  ?Your physician recommends that you continue on your current medications as directed. Please refer to the Current Medication list given to you today. ? ?*If you need a refill on your cardiac medications before your next appointment, please call your pharmacy* ? ? ?Lab Work: ?Your physician recommends that you return for lab work in:  ? ?Labs today: BMP, Pro BNP ? ?If you have labs (blood work) drawn today and your tests are completely normal, you will receive your results only by: ?MyChart Message (if you have MyChart) OR ?A paper copy in the mail ?If you have any lab test that is abnormal or we need to change your treatment, we will call you to review the results. ? ? ?Testing/Procedures: ?None ? ? ?Follow-Up: ?At [facility], you and your health needs are our priority.  As part of our continuing mission to provide you with exceptional heart care, we have created designated Provider Care Teams.  These Care Teams include your primary Cardiologist (physician) and Advanced Practice Providers (APPs -  Physician Assistants and Nurse Practitioners) who all work together to provide you with the care you need, when you need it. ? ?We recommend signing up for the patient portal called "MyChart".  Sign up information is provided on this After Visit Summary.  MyChart is used to connect with patients for Virtual Visits (Telemedicine).  Patients are able to view lab/test results, encounter notes, upcoming appointments, etc.  Non-urgent messages can be sent to your provider as well.   ?To learn more about what you can do with MyChart, go to [email or web address].   ? ?Your next appointment:   ?2 month(s) ? ?The format for your next appointment:   ?In Person ? ?Provider:   ?Jenne Campus, MD  ? ? ?Other Instructions ?None ? ?

## 2021-10-24 NOTE — Telephone Encounter (Signed)
..  Type of form received: Med Clearance for Dental treatment ? ?Additional comments:  ? ?Received by: Adonis Brook ? ?Form should be Faxed [phone] ? ?Form should be mailed to:   ? ?Is patient requesting call for pickup: ? ? ?Form placed:  In provider's box ? ?Attach charge sheet. No ? ?Individual made aware of 3-5 business day turn around (Y/N)? ?  ?

## 2021-10-25 LAB — BASIC METABOLIC PANEL
BUN/Creatinine Ratio: 22 (ref 9–23)
BUN: 28 mg/dL — ABNORMAL HIGH (ref 6–24)
CO2: 26 mmol/L (ref 20–29)
Calcium: 9.4 mg/dL (ref 8.7–10.2)
Chloride: 96 mmol/L (ref 96–106)
Creatinine, Ser: 1.27 mg/dL — ABNORMAL HIGH (ref 0.57–1.00)
Glucose: 91 mg/dL (ref 70–99)
Potassium: 3.9 mmol/L (ref 3.5–5.2)
Sodium: 139 mmol/L (ref 134–144)
eGFR: 50 mL/min/{1.73_m2} — ABNORMAL LOW (ref >59)

## 2021-10-25 LAB — PRO B NATRIURETIC PEPTIDE: NT-Pro BNP: 128 pg/mL (ref 0–287)

## 2021-10-25 NOTE — Telephone Encounter (Signed)
Error on collecting $255 Disregard ?

## 2021-10-26 ENCOUNTER — Ambulatory Visit: Payer: Commercial Managed Care - HMO | Admitting: Physician Assistant

## 2021-10-26 ENCOUNTER — Encounter: Payer: Self-pay | Admitting: Physician Assistant

## 2021-10-26 VITALS — BP 146/79 | HR 70 | Temp 98.0°F | Ht 60.0 in | Wt 370.2 lb

## 2021-10-26 DIAGNOSIS — Z6841 Body Mass Index (BMI) 40.0 and over, adult: Secondary | ICD-10-CM

## 2021-10-26 DIAGNOSIS — Z23 Encounter for immunization: Secondary | ICD-10-CM | POA: Diagnosis not present

## 2021-10-26 DIAGNOSIS — Z01818 Encounter for other preprocedural examination: Secondary | ICD-10-CM | POA: Diagnosis not present

## 2021-10-26 DIAGNOSIS — I5032 Chronic diastolic (congestive) heart failure: Secondary | ICD-10-CM

## 2021-10-26 DIAGNOSIS — I272 Pulmonary hypertension, unspecified: Secondary | ICD-10-CM

## 2021-10-26 DIAGNOSIS — I10 Essential (primary) hypertension: Secondary | ICD-10-CM

## 2021-10-26 DIAGNOSIS — G4733 Obstructive sleep apnea (adult) (pediatric): Secondary | ICD-10-CM

## 2021-10-26 NOTE — Progress Notes (Signed)
? ?Subjective:  ? ? Patient ID: Christina Dominguez, female    DOB: 23-Jul-1966, 56 y.o.   MRN: [id number] ? ?Chief Complaint  ?Patient presents with  ? Hospitalization Follow-up  ? ? ?HPI ?Patient is in today for hospital follow-up from [facility].  Patient was hospitalized from 10/03/2021 to 10/11/2021 for acute on chronic diastolic heart failure.  She was admitted to the cardiac ward and placed on aggressive diuresis with furosemide after she initially presented for worsening dyspnea for 2 weeks and 18 pound weight gain over the last 6 weeks.  She was recently hospitalized prior to that for another heart failure exacerbation from 08/18/2021 to 08/21/2021. ? ?TODAY: ?-Feeling better, most COVID symptoms resolved, somewhat fatigued still ?-Still hasn't heard about follow-up to schedule sleep study - active referral placed two days ago from cardiologist ?-Weighs herself daily, app to track calorie intake (1200 on average daily), usually overnight ?-Swelling has been improving gradually in legs ?-Needing to sleep almost vertically at night ?-Hoping to get back to the gym tomorrow  ? ?*Interested in Shingrix ?*Needing dental and surgical clearance ?*Omicron vaccine  ? ?F/up with cardiologist in 2 months ?F/up with Bariatric specialists weekly  ? ?Past Medical History:  ?Diagnosis Date  ? Anxiety   ? Aortic atherosclerosis ([place]) 08/19/2021  ? Asthma   ? Bipolar 1 disorder ([place])   ? CHF (congestive heart failure) ([place])   ? Class 3 severe obesity with serious comorbidity and body mass index (BMI) greater than or equal to 70 in adult [facility]) 12/21/2019  ? Depression   ? Diastolic dysfunction 68/34/1962  ? Diverticulosis 08/19/2021  ? Edema of both lower extremities   ? Hepatic steatosis 08/19/2021  ? Hepatomegaly 08/19/2021  ? High blood pressure   ? Joint pain   ? Prediabetes 12/22/2019  ? ? ?Past Surgical History:  ?Procedure Laterality Date  ? NO PAST SURGERIES    ? RIGHT/LEFT HEART CATH AND CORONARY ANGIOGRAPHY N/A  10/06/2021  ? Procedure: RIGHT/LEFT HEART CATH AND CORONARY ANGIOGRAPHY;  Surgeon: Jettie Booze, MD;  Location: [place] [facility];  Service: Cardiovascular;  Laterality: N/A;  ? ? ?Family History  ?Problem Relation Age of Onset  ? Heart Problems Mother   ?     Broken heart syndrome  ? Hypertension Mother   ? Hypercholesterolemia Mother   ? Bone cancer Father   ? Testicular cancer Father   ? Heart attack Sister   ? Diabetes Brother   ? Colon cancer Maternal Grandfather   ? Dementia Paternal Grandmother   ? ? ?Social History  ? ?Tobacco Use  ? Smoking status: Never  ?  Passive exposure: Never  ? Smokeless tobacco: Never  ?Vaping Use  ? Vaping Use: Never used  ?Substance Use Topics  ? Alcohol use: Yes  ?  Alcohol/week: 2.0 standard drinks  ?  Types: 1 Glasses of wine, 1 Standard drinks or equivalent per week  ?  Comment: occ  ? Drug use: Never  ?  ? ?Allergies  ?Allergen Reactions  ? Aspirin Nausea And Vomiting  ? Eggs Or Egg-Derived Products Swelling  ? Other Rash  ?  Pt states she is allergic to eggs and flu vaccine - swelling  ? ? ?Review of Systems ?NEGATIVE UNLESS OTHERWISE INDICATED IN HPI ? ? ?   ?Objective:  ?  ? ?BP (!) 146/79   Pulse 70   Temp 98 ?F (36.7 ?C)   Ht 5' (1.524 m)   Annell Greening Marland Kitchen)  370 lb 3.2 oz (167.9 kg)   SpO2 96%   BMI 72.30 kg/m?  ? ?Wt Readings from Last 3 Encounters:  ?10/26/21 (!) 370 lb 3.2 oz (167.9 kg)  ?10/24/21 (!) 368 lb (166.9 kg)  ?10/11/21 (!) 361 lb 11.2 oz (164.1 kg)  ? ? ?BP Readings from Last 3 Encounters:  ?10/26/21 (!) 146/79  ?10/24/21 136/78  ?10/11/21 126/67  ?  ? ?Physical Exam ?Vitals and nursing note reviewed.  ?Constitutional:   ?   Appearance: Normal appearance. She is obese. She is not toxic-appearing.  ?HENT:  ?   Head: Normocephalic and atraumatic.  ?   Right Ear: External ear normal.  ?   Left Ear: External ear normal.  ?   Nose: Nose normal.  ?   Mouth/Throat:  ?   Mouth: Mucous membranes are moist.  ?Eyes:  ?   Extraocular Movements: Extraocular movements  intact.  ?   Conjunctiva/sclera: Conjunctivae normal.  ?   Pupils: Pupils are equal, round, and reactive to light.  ?Cardiovascular:  ?   Rate and Rhythm: Normal rate and regular rhythm.  ?   Pulses: Normal pulses.  ?   Heart sounds: Normal heart sounds.  ?Pulmonary:  ?   Effort: Pulmonary effort is normal.  ?   Breath sounds: Normal breath sounds.  ?Musculoskeletal:     ?   General: Normal range of motion.  ?   Cervical back: Normal range of motion and neck supple.  ?   Right lower leg: Edema present.  ?   Left lower leg: Edema present.  ?Skin: ?   General: Skin is warm and dry.  ?Neurological:  ?   General: No focal deficit present.  ?   Mental Status: She is alert and oriented to person, place, and time.  ?Psychiatric:     ?   Mood and Affect: Mood normal.     ?   Behavior: Behavior normal.     ?   Thought Content: Thought content normal.     ?   Judgment: Judgment normal.  ? ? ?   ?Assessment & Plan:  ? ?Problem List Items Addressed This Visit   ? ?  ? Cardiovascular and Mediastinum  ? Essential hypertension  ? Pulmonary hypertension, unspecified ([place])  ?  ? Respiratory  ? Obstructive sleep apnea  ?  ? Other  ? Class 3 severe obesity with serious comorbidity and body mass index (BMI) greater than or equal to 70 in adult [facility])  ? ?Other Visit Diagnoses   ? ? Chronic diastolic heart failure (HCC)    -  Primary  ? Preoperative clearance      ? Need for shingles vaccine      ? Relevant Orders  ? Varicella-zoster vaccine IM (Shingrix) (Completed)  ? ?  ? ?1. Chronic diastolic heart failure ([place]) ?2. Pulmonary hypertension, unspecified ([place]) ?Pt is following with Dr. Agustin Cree, cardiologist.  Most recent visit was on 10/24/2021. ? ?Per cardiologist: ?-Increase dose of Lasix from 80 mg daily to 120 mg daily for about 4 days and when she does this she need to double the dose of potassium  ?-After 4 days she need to return to current dosages of Lasix which is 80 and 1 tablet of potassium ? ?3. Essential  hypertension ?Blood pressure is slightly on the higher side, also being followed by cardiology.  Diuretic increased should help and then she may potentially need an ARB added per visit with cardiologist.  She will  continue to monitor at home. ? ?4. Preoperative clearance ?5. Class 3 severe obesity due to excess calories with serious comorbidity and body mass index (BMI) greater than or equal to 70 in adult [facility]) ?Patient needing clearance for dental procedure and also for bariatric surgery she is planning for through [place].  Patient states that she already has cardiology clearance, just needs clearance notes from myself as well. ?RCRI Score - Class II Low (6.2% complications) ?Form completed for dental clearance today.  Patient to let me know what she needs for her bariatric surgery as well and we can complete this at a later time. ? ?6. Obstructive sleep apnea ?Witnessed sleep apnea while she was in the hospital.  Cardiology has already placed referral for sleep study. ? ?7. Need for shingles vaccine ?First dose of Shingrix given today.  Plan for second dose in 2 to 5 months. ? ?All medications were reconciled today.  I personally reviewed patient's labs, imaging, discharge summary from the hospital. ? ?Plan for 3 months recheck with me.  Or as needed. ? ? ?This note was prepared with assistance of Systems analyst. Occasional wrong-word or sound-a-like substitutions may have occurred due to the inherent limitations of voice recognition software. ? ? ?Arnelle Nale M Aubriee Szeto, PA-C ?

## 2021-10-26 NOTE — Patient Instructions (Addendum)
*  Please call your [place] office and see what specific clearances they need ?*Form completed for dental procedure ?*Keep monitoring weight & blood pressure ?*You should be contacted about sleep study ?*Keep working towards health goals ? ?First shingrix dose today ?Talk to pharmacist about next COVID booster.  ? ?Per cardiologist: ?-Increase dose of Lasix from 80 mg daily to 120 mg daily for about 4 days and when she does this she need to double the dose of potassium  ?-After 4 days she need to return to current dosages of Lasix which is 80 and 1 tablet of potassium ?

## 2021-11-02 ENCOUNTER — Other Ambulatory Visit: Payer: Self-pay | Admitting: Cardiology

## 2021-11-02 ENCOUNTER — Telehealth: Payer: Self-pay

## 2021-11-02 ENCOUNTER — Institutional Professional Consult (permissible substitution): Payer: Managed Care, Other (non HMO) | Admitting: Pulmonary Disease

## 2021-11-02 MED ORDER — POTASSIUM CHLORIDE CRYS ER 20 MEQ PO TBCR: EXTENDED_RELEASE_TABLET | ORAL | 0 refills | Status: DC

## 2021-11-02 MED ORDER — FUROSEMIDE 80 MG PO TABS: 120.0000 mg | ORAL_TABLET | Freq: Every day | ORAL | 0 refills | Status: DC

## 2021-11-02 NOTE — Telephone Encounter (Signed)
Patient notified of results and recommendations and agreed with plan. Scripts sent  ?

## 2021-11-02 NOTE — Telephone Encounter (Signed)
-----   Message from Park Liter, MD sent at 10/25/2021  7:57 PM EDT ----- ?There is no evidence of CHF on the test, but I recommend to increase dose of Lasix from 80 mg daily to 120 mg daily for about 4 days and when she does this she need to double the dose of potassium after 4 days she need to return to current dosages of Lasix which is 80 and 1 tablet of potassium ?

## 2021-11-23 ENCOUNTER — Ambulatory Visit (INDEPENDENT_AMBULATORY_CARE_PROVIDER_SITE_OTHER): Payer: Managed Care, Other (non HMO) | Admitting: Pulmonary Disease

## 2021-11-23 ENCOUNTER — Encounter: Payer: Self-pay | Admitting: Pulmonary Disease

## 2021-11-23 VITALS — BP 130/78 | HR 87 | Ht 60.0 in | Wt 373.0 lb

## 2021-11-23 DIAGNOSIS — G4730 Sleep apnea, unspecified: Secondary | ICD-10-CM | POA: Diagnosis not present

## 2021-11-23 DIAGNOSIS — I2729 Other secondary pulmonary hypertension: Secondary | ICD-10-CM

## 2021-11-23 DIAGNOSIS — R062 Wheezing: Secondary | ICD-10-CM | POA: Diagnosis not present

## 2021-11-23 DIAGNOSIS — R0683 Snoring: Secondary | ICD-10-CM | POA: Diagnosis not present

## 2021-11-23 NOTE — Progress Notes (Signed)
? ? ?Subjective:  ? ?PATIENT ID: Christina Dominguez GENDER: female DOB: 04/18/1966, MRN: [id number] ? ? ?HPI ? ?Chief Complaint  ?Patient presents with  ? Consult  ?  Asthma   ? ? ?Reason for Visit: New consult for shortness of breath, asthma and OSA evaluation ? ?Ms. Christina Dominguez is a 56 year old female never smoker with morbid obesity, clinical asthma, hx of chronic bronchitis (no recent issues in 6 years), HTN, HLD, moderate pulmonary hypertension, chronic diastolic heart failure, OSA who presents for concern for asthma and OSA evaluation. ? ?She was incidentally diagnosed with COVID in March 2023 during her hospitalization for heart failure. Since then she reports her shortness of breath and wheezing has remained. She has been more active with water aerobics and changed her diet. Wheezing at night has resolved.  ? ?While in the hospital she reports episodes of witnessed apnea by staff and was started on CPAP. Tolerated it well and reported improved sleep quality. At home she would wake up every two hours due to nocturia. Now has fatigue off the machine. ? 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 >10 years ago. ? ?I have personally reviewed patient's past medical/family/social history, allergies, current medications. ? ?Past Medical History:  ?Diagnosis Date  ? Anxiety   ? Aortic atherosclerosis ([place]) 08/19/2021  ? Asthma   ? Bipolar 1 disorder ([place])   ? CHF (congestive heart failure) ([place])   ? Class 3 severe obesity with serious comorbidity and body mass index (BMI) greater than or equal to 70 in adult [facility]) 12/21/2019  ? Depression   ? Diastolic dysfunction 29/93/7169  ? Diverticulosis 08/19/2021  ? Edema of both lower extremities   ? Hepatic steatosis 08/19/2021  ? Hepatomegaly 08/19/2021  ? High blood pressure   ? Joint pain   ? Prediabetes 12/22/2019  ?  ? ?Family History  ?Problem Relation Age of Onset  ? Heart Problems Mother   ?     Broken heart  syndrome  ? Hypertension Mother   ? Hypercholesterolemia Mother   ? Bone cancer Father   ? Testicular cancer Father   ? Heart attack Sister   ? Diabetes Brother   ? Colon cancer Maternal Grandfather   ? Dementia Paternal Grandmother   ?  ? ?Social History  ? ?Occupational History  ? Occupation: Medical illustrator, work from home  ?Tobacco Use  ? Smoking status: Never  ?  Passive exposure: Never  ? Smokeless tobacco: Never  ?Vaping Use  ? Vaping Use: Never used  ?Substance and Sexual Activity  ? Alcohol use: Yes  ?  Alcohol/week: 2.0 standard drinks  ?  Types: 1 Glasses of wine, 1 Standard drinks or equivalent per week  ?  Comment: occ  ? Drug use: Never  ? Sexual activity: Not on file  ? ? ?Allergies  ?Allergen Reactions  ? Aspirin Nausea And Vomiting  ? Eggs Or Egg-Derived Products Swelling  ? Other Rash  ?  Pt states she is allergic to eggs and flu vaccine - swelling  ?  ? ?Outpatient Medications Prior to Visit  ?Medication Sig Dispense Refill  ? acetaminophen (TYLENOL) 650 MG CR tablet Take 1,300 mg by mouth daily.    ? albuterol (VENTOLIN HFA) 108 (90 Base) MCG/ACT inhaler Inhale 1 puff into the lungs every 4 (four) hours as needed for wheezing or shortness of breath.    ? CATS CLAW, UNCARIA TOMENTOSA, PO Take 2  capsules by mouth daily. Unknown strenght    ? Cholecalciferol (VITAMIN D) 125 MCG (5000 UT) CAPS Take 1 tablet by mouth daily.    ? diclofenac Sodium (VOLTAREN) 1 % GEL Apply 2 g topically daily as needed (pain).    ? furosemide (LASIX) 80 MG tablet Take 1.5 tablets (120 mg total) by mouth daily. Take 1.5 tablet ('120mg'$ ) with '40mg'$  of Potassium CL daily for 4 days then resume to '80mg'$  of Lasix and '20mg'$  of Potassium CL daily. 32 tablet 0  ? potassium chloride SA (KLOR-CON M) 20 MEQ tablet Take 2 tablets by mouth daily with '120mg'$  of Lasix for 4 days then resume taking the original dose of 19mq of potassium cl daily. 34 tablet 0  ? valsartan (DIOVAN) 40 MG tablet Take 1 tablet (40 mg total) by mouth daily. 90  tablet 3  ? atorvastatin (LIPITOR) 40 MG tablet Take 1 tablet (40 mg total) by mouth daily. 30 tablet 0  ? ?No facility-administered medications prior to visit.  ? ? ?Review of Systems  ?Constitutional:  Negative for chills, diaphoresis, fever, malaise/fatigue and weight loss.  ?HENT:  Negative for congestion.   ?Respiratory:  Positive for cough and shortness of breath. Negative for hemoptysis, sputum production and wheezing.   ?Cardiovascular:  Negative for chest pain, palpitations and leg swelling.  ? ? ?Objective:  ? ?Vitals:  ? 11/23/21 1440  ?BP: 130/78  ?Pulse: 87  ?SpO2: 98%  ?Weight: (!) 373 lb (169.2 kg)  ?Height: 5' (1.524 m)  ? ?SpO2: 98 % ?O2 Device: None (Room air) ? ?Physical Exam: ?General: Well-appearing, no acute distress ?HENT: [place], AT ?Eyes: EOMI, no scleral icterus ?Respiratory: Clear to auscultation bilaterally.  No crackles, wheezing or rales ?Cardiovascular: RRR, -M/R/G, no JVD ?Extremities:-Edema,-tenderness ?Neuro: AAO x4, CNII-XII grossly intact ?Psych: Normal mood, normal affect ? ?Data Reviewed: ? ?Imaging: ?CTA 10/03/21 - No PE. Dilation of main pulmonary artery suggestive of PH. 4.5 x 3.0 cm ovoid structure in the posterior mediastinum along the spine at T4-T5 on the right ?MR chest 10/05/2021-paraspinous cystic lesion in the right aspect of T4-T5 arising from the pleura or neural foramen. 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 Pulmonary artery enlargement. ?CXR 10/09/2021-bibasilar atelectasis ? ?PFT: ?None on file ? ?Labs: ?CBC ?   ?Component Value Date/Time  ? WBC 4.8 10/10/2021 0043  ? RBC 4.12 10/10/2021 0043  ? HGB 12.1 10/10/2021 0043  ? HCT 37.2 10/10/2021 0043  ? PLT 274 10/10/2021 0043  ? MCV 90.3 10/10/2021 0043  ? MCH 29.4 10/10/2021 0043  ? MCHC 32.5 10/10/2021 0043  ? RDW 14.6 10/10/2021 0043  ? LYMPHSABS 2.1 10/03/2021 2120  ? MONOABS 0.7 10/03/2021 2120  ? EOSABS 0.2 10/03/2021 2120  ? BASOSABS 0.0 10/03/2021 2120  ? ?Absolute eos 10/03/21 - 200 ?    ?Assessment & Plan:  ? ?Discussion: ?57year old female never smoker with morbid obesity, clinical asthma, hx of chronic bronchitis (no recent issues in 6 years), HTN, HLD, moderate pulmonary hypertension, chronic diastolic heart failure, OSA who presents for concern for asthma and OSA evaluation. Recent hospitalization demonstrated incidental COVID infection followed by wheezing and shortness of breath and also symptoms concerning for sleep apnea with benefit of inpatient CPAP ? ?Wheezing  ?--ORDER pulmonary function tests ? ?Suspected OSA: snoring, witnessed apnea ?Pulmonary hypertension ?--ORDER overnight oximetry ?--ORDER split night study ?--Continue regular exercise and weight loss efforts ? ?Health Maintenance ?Immunization History  ?Administered Date(s) Administered  ? Janssen (J&J) SARS-COV-2 Vaccination 11/16/2019  ?  Tdap 04/01/2019  ? Zoster Recombinat (Shingrix) 10/26/2021  ? ? ?Orders Placed This Encounter  ?Procedures  ? Pulse oximetry, overnight  ?  Standing Status:   Future  ?  Standing Expiration Date:   11/24/2022  ?  Scheduling Instructions:  ?   On room air  ?   Please schedule before 01/30/2022  ? Pulmonary function test  ?  Standing Status:   Future  ?  Standing Expiration Date:   11/24/2022  ?  Order Specific Question:   Where should this test be performed?  ?  Answer:   [facility]  ?  Order Specific Question:   Full PFT: includes the following: basic spirometry, spirometry pre & post bronchodilator, diffusion capacity (DLCO), lung volumes  ?  Answer:   Full PFT  ? Split night study  ?  Standing Status:   Future  ?  Standing Expiration Date:   11/24/2022  ?  Scheduling Instructions:  ?   Please schedule before  01/30/2022 OV  ?  Order Specific Question:   Where should this test be performed:  ?  Answer:   [place] [facility]  ?No orders of the defined types were placed in this encounter. ? ? ?No follow-ups on file. After PFTs ? ?I have spent a total time of 45-minutes on the day of  the appointment reviewing prior documentation, coordinating care and discussing medical diagnosis and plan with the patient/family. Imaging, labs and tests included in this note have been reviewed and interpreted ind

## 2021-11-23 NOTE — Patient Instructions (Addendum)
Wheezing  ?--ORDER pulmonary function tests ? ?Suspected OSA ?Pulmonary hypertension ?--ORDER overnight oximetry ?--ORDER split night study ?--Continue regular exercise and weight loss efforts ? ?Follow-up with me after PFTs in May or June ?

## 2021-11-25 ENCOUNTER — Encounter (HOSPITAL_BASED_OUTPATIENT_CLINIC_OR_DEPARTMENT_OTHER): Payer: Self-pay | Admitting: Emergency Medicine

## 2021-11-25 DIAGNOSIS — J45909 Unspecified asthma, uncomplicated: Secondary | ICD-10-CM | POA: Insufficient documentation

## 2021-11-25 DIAGNOSIS — S8992XA Unspecified injury of left lower leg, initial encounter: Secondary | ICD-10-CM | POA: Diagnosis present

## 2021-11-25 DIAGNOSIS — I110 Hypertensive heart disease with heart failure: Secondary | ICD-10-CM | POA: Insufficient documentation

## 2021-11-25 DIAGNOSIS — I509 Heart failure, unspecified: Secondary | ICD-10-CM | POA: Insufficient documentation

## 2021-11-25 DIAGNOSIS — S8012XA Contusion of left lower leg, initial encounter: Secondary | ICD-10-CM | POA: Insufficient documentation

## 2021-11-25 DIAGNOSIS — W07XXXA Fall from chair, initial encounter: Secondary | ICD-10-CM | POA: Insufficient documentation

## 2021-11-25 NOTE — ED Triage Notes (Signed)
Pt sitting in chair, that broke and pinned her left leg underneath her. Pt reports pain and swelling at site of bruising on left lower leg. ?

## 2021-11-26 ENCOUNTER — Emergency Department (HOSPITAL_BASED_OUTPATIENT_CLINIC_OR_DEPARTMENT_OTHER)
Admission: EM | Admit: 2021-11-26 | Discharge: 2021-11-26 | Disposition: A | Discharge status: Home / Self Care | Payer: Commercial Managed Care - HMO | Attending: Emergency Medicine | Admitting: Emergency Medicine

## 2021-11-26 ENCOUNTER — Emergency Department (HOSPITAL_BASED_OUTPATIENT_CLINIC_OR_DEPARTMENT_OTHER): Payer: Commercial Managed Care - HMO

## 2021-11-26 DIAGNOSIS — S8012XA Contusion of left lower leg, initial encounter: Secondary | ICD-10-CM

## 2021-11-26 NOTE — ED Provider Notes (Signed)
?[place] [facility] EM[facility] DEPT ?Provider Note ? ? ?CSN: [id number] ?Arrival date & time: 11/25/21  2108 ? ?  ? ?History ? ?Chief Complaint  ?Patient presents with  ? Leg Injury  ? ? ?Christina Dominguez is a 56 y.o. female. ? ?The history is provided by the patient.  ?Christina Dominguez is a 56 y.o. female who presents to the Emergency Department complaining of fall, leg injury. She presents to the ED accompanied by a friend for evaluation following a fall.  She was sitting in a chair around 7 PM when the chair broke and she fell backwards landing on her back.  Her left leg went out and got stuck under the table that she was sitting at.  She complains of severe pain to the left shin and lower leg that has worsened since the injury.  She is able to bear weight but she has pain with weightbearing.  No hip pain, knee pain, ankle pain, foot pain.  No head injury. ? ?Hx/o CHF, HTN, asthma.  Not anticoagulated.   ?Home Medications ?Prior to Admission medications   ?Medication Sig Start Date End Date Taking? Authorizing Provider  ?acetaminophen (TYLENOL) 650 MG CR tablet Take 1,300 mg by mouth daily.    [provider]  ?albuterol (VENTOLIN HFA) 108 (90 Base) MCG/ACT inhaler Inhale 1 puff into the lungs every 4 (four) hours as needed for wheezing or shortness of breath.    [provider]  ?atorvastatin (LIPITOR) 40 MG tablet Take 1 tablet (40 mg total) by mouth daily. 08/22/21   Little Ishikawa, MD  ?CATS CLAW, UNCARIA TOMENTOSA, PO Take 2 capsules by mouth daily. Unknown strenght    [provider]  ?Cholecalciferol (VITAMIN D) 125 MCG (5000 UT) CAPS Take 1 tablet by mouth daily.    [provider]  ?diclofenac Sodium (VOLTAREN) 1 % GEL Apply 2 g topically daily as needed (pain).    [provider]  ?furosemide (LASIX) 80 MG tablet Take 1.5 tablets (120 mg total) by mouth daily. Take 1.5 tablet ('120mg'$ ) with '40mg'$  of Potassium CL daily for 4 days then resume to '80mg'$   of Lasix and '20mg'$  of Potassium CL daily. 11/02/21 01/31/22  Park Liter, MD  ?potassium chloride SA (KLOR-CON M) 20 MEQ tablet Take 2 tablets by mouth daily with '120mg'$  of Lasix for 4 days then resume taking the original dose of 36mq of potassium cl daily. 11/02/21   KPark Liter MD  ?valsartan (DIOVAN) 40 MG tablet Take 1 tablet (40 mg total) by mouth daily. 08/09/21   MRichardo Priest MD  ?   ? ?Allergies    ?Aspirin, Eggs or egg-derived products, and Other   ? ?Review of Systems   ?Review of Systems  ?All other systems reviewed and are negative. ? ?Physical Exam ?Updated Vital Signs ?BP 116/71   Pulse 78   Temp 97.9 ?F (36.6 ?C)   Resp 20   SpO2 99%  ?Physical Exam ?Vitals and nursing note reviewed.  ?Constitutional:   ?   Appearance: She is well-developed.  ?HENT:  ?   Head: Normocephalic and atraumatic.  ?Cardiovascular:  ?   Rate and Rhythm: Normal rate and regular rhythm.  ?   Heart sounds: No murmur heard. ?Pulmonary:  ?   Effort: Pulmonary effort is normal. No respiratory distress.  ?   Breath sounds: Normal breath sounds.  ?Musculoskeletal:     ?   General: Swelling present. No tenderness.  ?   Comments:  2+ Dp pulses bilaterally.  Soft tissue swelling to the mid and distal lateral tib fib area with central ecchymosis.  There is no tenderness over the left knee or ankle. Flexion extension intact at the ankle, knee  ?Skin: ?   General: Skin is warm and dry.  ?Neurological:  ?   Mental Status: She is alert and oriented to person, place, and time.  ?   Comments: Sensation to light touch intact in BLE.  5/5 dorsiflexion plantar flexion bilaterally  ?Psychiatric:     ?   Behavior: Behavior normal.  ? ? ? ?ED Results / Procedures / Treatments   ?Labs ?(all labs ordered are listed, but only abnormal results are displayed) ?Labs Reviewed - No data to display ? ?EKG ?None ? ?Radiology ?DG Tibia/Fibula Left ? ?Result Date: 11/26/2021 ?CLINICAL DATA:  Pain, left leg injury EXAM: LEFT TIBIA AND FIBULA - 2  VIEW COMPARISON:  None. FINDINGS: No fracture or dislocation is seen. Moderate degenerative changes of the knee. The visualized soft tissues are unremarkable. IMPRESSION: Negative. Electronically Signed   By: Julian Hy M.D.   On: 11/26/2021 00:43   ? ?Procedures ?Procedures  ? ? ?Medications Ordered in ED ?Medications - No data to display ? ?ED Course/ Medical Decision Making/ A&P ?  ?                        ?Medical Decision Making ?Amount and/or Complexity of Data Reviewed ?Radiology: ordered. ? ? ?Patient here for evaluation of injuries following a fall out of a chair earlier today.  She has significant soft tissue swelling and ecchymosis to the left tib-fib area.  She is well perfused distally.  There is no open wound.  No clinical evidence of fracture.  Plain films are negative for acute fracture, images personally reviewed.  Discussed with patient local care for hematoma.  Recommend that she elevates the area and applies cool compresses to decrease the swelling.  No current evidence of compartment syndrome but did discuss with patient return precautions for new concerning or progressive symptoms. ? ? ? ? ? ? ? ?Final Clinical Impression(s) / ED Diagnoses ?Final diagnoses:  ?Hematoma of left lower extremity, initial encounter  ? ? ?Rx / DC Orders ?ED Discharge Orders   ? ? None  ? ?  ? ? ?  ?Quintella Reichert, MD ?11/26/21 0121 ? ?

## 2021-11-29 ENCOUNTER — Encounter: Payer: Self-pay | Admitting: Pulmonary Disease

## 2021-12-01 ENCOUNTER — Telehealth: Payer: Self-pay | Admitting: Pulmonary Disease

## 2021-12-01 ENCOUNTER — Ambulatory Visit (INDEPENDENT_AMBULATORY_CARE_PROVIDER_SITE_OTHER): Payer: Commercial Managed Care - HMO | Admitting: Physician Assistant

## 2021-12-01 ENCOUNTER — Encounter: Payer: Self-pay | Admitting: Physician Assistant

## 2021-12-01 VITALS — BP 127/82 | HR 82 | Temp 98.7°F | Ht 60.0 in | Wt 370.8 lb

## 2021-12-01 DIAGNOSIS — L03116 Cellulitis of left lower limb: Secondary | ICD-10-CM | POA: Diagnosis not present

## 2021-12-01 DIAGNOSIS — S8012XA Contusion of left lower leg, initial encounter: Secondary | ICD-10-CM | POA: Diagnosis not present

## 2021-12-01 DIAGNOSIS — R0683 Snoring: Secondary | ICD-10-CM

## 2021-12-01 MED ORDER — CEPHALEXIN 500 MG PO CAPS: 500.0000 mg | ORAL_CAPSULE | Freq: Four times a day (QID) | ORAL | 0 refills | Status: AC

## 2021-12-01 NOTE — Progress Notes (Signed)
? ?Subjective:  ? ? Patient ID: Christina Dominguez, female    DOB: Dec 17, 1965, 56 y.o.   MRN: [id number] ? ?Chief Complaint  ?Patient presents with  ? Fall  ? Leg Injury  ?  Left; Hematoma of left lower extremity. She complains of swelling in left foot as well.   ? Tingling  ?  Bottom of foot.  ? ? ?HPI ?Patient is in today for ED f/up from 11/26/21. Went to [facility] ED after she was sitting in a chair and the chair broke, causing her to fall landing on her back. Left leg got stuck under the table she was sitting at. Complained of severe pain to left shin and lower leg after the injury - able to bear weight but having pain with it.  ? ?Says she stayed in bed from Sunday - Tuesday. Only to use restroom. Unbearable to put weight on it those days.  ?Up more on Wed and Thurs to cook and clean.  ?Walked into office with walker today, but says it hurts.  ? ?Concerned about the swelling and color of the area (more red).  ?Now having some numbness / tingling on the bottom of her foot, intermittent; says this was worse yesterday as she was up walking around more.  ? ?Past Medical History:  ?Diagnosis Date  ? Anxiety   ? Aortic atherosclerosis ([place]) 08/19/2021  ? Asthma   ? Bipolar 1 disorder ([place])   ? CHF (congestive heart failure) ([place])   ? Class 3 severe obesity with serious comorbidity and body mass index (BMI) greater than or equal to 70 in adult [facility] 12/21/2019  ? Depression   ? Diastolic dysfunction 01/75/1025  ? Diverticulosis 08/19/2021  ? Edema of both lower extremities   ? Hepatic steatosis 08/19/2021  ? Hepatomegaly 08/19/2021  ? High blood pressure   ? Joint pain   ? Prediabetes 12/22/2019  ? ? ?Past Surgical History:  ?Procedure Laterality Date  ? NO PAST SURGERIES    ? RIGHT/LEFT HEART CATH AND CORONARY ANGIOGRAPHY N/A 10/06/2021  ? Procedure: RIGHT/LEFT HEART CATH AND CORONARY ANGIOGRAPHY;  Surgeon: Jettie Booze, MD;  Location: [place] [facility];  Service: Cardiovascular;  Laterality: N/A;  ? ? ?Family  History  ?Problem Relation Age of Onset  ? Heart Problems Mother   ?     Broken heart syndrome  ? Hypertension Mother   ? Hypercholesterolemia Mother   ? Bone cancer Father   ? Testicular cancer Father   ? Heart attack Sister   ? Diabetes Brother   ? Colon cancer Maternal Grandfather   ? Dementia Paternal Grandmother   ? ? ?Social History  ? ?Tobacco Use  ? Smoking status: Never  ?  Passive exposure: Never  ? Smokeless tobacco: Never  ?Vaping Use  ? Vaping Use: Never used  ?Substance Use Topics  ? Alcohol use: Yes  ?  Alcohol/week: 2.0 standard drinks  ?  Types: 1 Glasses of wine, 1 Standard drinks or equivalent per week  ?  Comment: occ  ? Drug use: Never  ?  ? ?Allergies  ?Allergen Reactions  ? Aspirin Nausea And Vomiting  ? Eggs Or Egg-Derived Products Swelling  ? Other Rash  ?  Pt states she is allergic to eggs and flu vaccine - swelling  ? ? ?Review of Systems ?NEGATIVE UNLESS OTHERWISE INDICATED IN HPI ? ? ?   ?Objective:  ?  ? ?BP 127/82   Pulse 82   Temp 98.7 ?F (37.1 ?  C) (Temporal)   Ht 5' (1.524 m)   Wt (!) 370 lb 12.8 oz (168.2 kg)   SpO2 96%   BMI 72.42 kg/m?  ? ?Wt Readings from Last 3 Encounters:  ?12/01/21 (!) 370 lb 12.8 oz (168.2 kg)  ?11/23/21 (!) 373 lb (169.2 kg)  ?10/26/21 (!) 370 lb 3.2 oz (167.9 kg)  ? ? ?BP Readings from Last 3 Encounters:  ?12/01/21 127/82  ?11/25/21 116/71  ?11/23/21 130/78  ?  ? ?Physical Exam ?Constitutional:   ?   Appearance: Normal appearance. She is obese. She is not ill-appearing.  ?   Comments: Rollator walker  ?Musculoskeletal:     ?   General: Signs of injury (LLE anterior hematoma similar in appearance to photo from ED visit with some increased erythema distal to hematoma; N/V intact. DP and PT pulses 2+.) present.  ?Neurological:  ?   Mental Status: She is alert.  ? ? ?   ?Assessment & Plan:  ? ?Problem List Items Addressed This Visit   ?None ?Visit Diagnoses   ? ? Hematoma of left lower leg    -  Primary  ? Cellulitis of left lower leg      ? ?   ? ? ? ?Meds ordered this encounter  ?Medications  ? cephALEXin (KEFLEX) 500 MG capsule  ?  Sig: Take 1 capsule (500 mg total) by mouth 4 (four) times daily for 7 days.  ?  Dispense:  28 capsule  ?  Refill:  0  ?  Order Specific Question:   Supervising Provider  ?  Answer:   Marin Olp [[id number]]  ? ?1. Hematoma of left lower leg ?2. Cellulitis of left lower leg ?I personally reviewed the patient's ED report and imaging from 11/26/2021.  X-ray of left tibia and fibula was negative for acute fracture.  I had Dr. Jerline Pain participate in exam today as well.  Elected to treat for possible new cellulitis with cephalexin 500 mg 4 times daily x7 days.  No signs of compartment syndrome on exam, but reminded her of the signs to watch for.  In the event of any worsening pain she is to go right to the emergency department.  Instructed patient to stay off her leg this weekend and elevate above heart level.  She can try ice to the area.  I will have close follow-up with her next week. ? ? ?This note was prepared with assistance of Systems analyst. Occasional wrong-word or sound-a-like substitutions may have occurred due to the inherent limitations of voice recognition software. ? ? ? ?Galdino Hinchman M Raymel Cull, PA-C ?

## 2021-12-01 NOTE — Telephone Encounter (Addendum)
Overnight Oximetry ? ?Reviewed test ?ONO 11/29/21 - SpO2 <88% 35 min and 24 sec. [place] SpO2 64% ? ?Assessment ?Nocturnal Hypoxemia ?--START oxygen for 2L via [place] at night (staff please order as new start) ?--I have called patient and updated her on plan  ? ?Rodman Pickle, M.D. ?[facility] ?12/01/2021 4:34 PM  ? ? ? ?

## 2021-12-05 ENCOUNTER — Ambulatory Visit: Payer: Managed Care, Other (non HMO) | Admitting: Physician Assistant

## 2021-12-06 ENCOUNTER — Ambulatory Visit (INDEPENDENT_AMBULATORY_CARE_PROVIDER_SITE_OTHER): Payer: Commercial Managed Care - HMO | Admitting: Physician Assistant

## 2021-12-06 ENCOUNTER — Encounter: Payer: Self-pay | Admitting: Physician Assistant

## 2021-12-06 VITALS — BP 132/84 | HR 87 | Temp 97.6°F | Ht 60.0 in | Wt 372.2 lb

## 2021-12-06 DIAGNOSIS — Z124 Encounter for screening for malignant neoplasm of cervix: Secondary | ICD-10-CM | POA: Diagnosis not present

## 2021-12-06 DIAGNOSIS — S8012XA Contusion of left lower leg, initial encounter: Secondary | ICD-10-CM | POA: Diagnosis not present

## 2021-12-06 DIAGNOSIS — L03116 Cellulitis of left lower limb: Secondary | ICD-10-CM | POA: Diagnosis not present

## 2021-12-06 DIAGNOSIS — Z01419 Encounter for gynecological examination (general) (routine) without abnormal findings: Secondary | ICD-10-CM

## 2021-12-06 NOTE — Patient Instructions (Signed)
Good to see you and thank you for coming in for follow-up.  Please call Dr. Jess Barters office for second evaluation and continued follow-up.  Continue to keep elevated as much as possible. ? ?ED if severe worsening symptoms at all.   ? ?Overall progress does seem to be slowly improving, good news!  ?

## 2021-12-06 NOTE — Progress Notes (Signed)
? ?Subjective:  ? ? Patient ID: Christina Dominguez, female    DOB: 09-Feb-1966, 56 y.o.   MRN: [id number] ? ?Chief Complaint  ?Patient presents with  ? Follow-up  ?  Pt states her leg isn't quite as red but mainly still the same; pt states still just as swollen ? ?Pt needs referral to OBGYN for Pap Smear that's overdue  ? ? ?HPI ?Patient is in today for follow-up from office visit on 12/01/2021, which was a follow-up visit on hematoma of left lower leg.  She was started on cephalexin 500 mg 4 times daily x7 days for possible secondary cellulitis.  Patient states that the redness is much better, but still has a lot of swelling, but no worse than initial visit.  She has been trying to stay off this leg.  Denies any pallor or severe pain. ? ?She also is requesting a referral to gynecology for well woman exam. ? ?Past Medical History:  ?Diagnosis Date  ? Anxiety   ? Aortic atherosclerosis ([place]) 08/19/2021  ? Asthma   ? Bipolar 1 disorder ([place])   ? CHF (congestive heart failure) ([place])   ? Class 3 severe obesity with serious comorbidity and body mass index (BMI) greater than or equal to 70 in adult [facility]) 12/21/2019  ? Depression   ? Diastolic dysfunction 57/08/7791  ? Diverticulosis 08/19/2021  ? Edema of both lower extremities   ? Hepatic steatosis 08/19/2021  ? Hepatomegaly 08/19/2021  ? High blood pressure   ? Joint pain   ? Prediabetes 12/22/2019  ? ? ?Past Surgical History:  ?Procedure Laterality Date  ? NO PAST SURGERIES    ? RIGHT/LEFT HEART CATH AND CORONARY ANGIOGRAPHY N/A 10/06/2021  ? Procedure: RIGHT/LEFT HEART CATH AND CORONARY ANGIOGRAPHY;  Surgeon: Jettie Booze, MD;  Location: [place] [facility];  Service: Cardiovascular;  Laterality: N/A;  ? ? ?Family History  ?Problem Relation Age of Onset  ? Heart Problems Mother   ?     Broken heart syndrome  ? Hypertension Mother   ? Hypercholesterolemia Mother   ? Bone cancer Father   ? Testicular cancer Father   ? Heart attack Sister   ? Diabetes Brother   ? Colon cancer  Maternal Grandfather   ? Dementia Paternal Grandmother   ? ? ?Social History  ? ?Tobacco Use  ? Smoking status: Never  ?  Passive exposure: Never  ? Smokeless tobacco: Never  ?Vaping Use  ? Vaping Use: Never used  ?Substance Use Topics  ? Alcohol use: Yes  ?  Alcohol/week: 2.0 standard drinks  ?  Types: 1 Glasses of wine, 1 Standard drinks or equivalent per week  ?  Comment: occ  ? Drug use: Never  ?  ? ?Allergies  ?Allergen Reactions  ? Aspirin Nausea And Vomiting  ? Eggs Or Egg-Derived Products Swelling  ? Other Rash  ?  Pt states she is allergic to eggs and flu vaccine - swelling  ? ? ?Review of Systems ?NEGATIVE UNLESS OTHERWISE INDICATED IN HPI ? ? ?   ?Objective:  ?  ? ?BP 132/84 (BP Location: Left Arm)   Pulse 87   Temp 97.6 ?F (36.4 ?C) (Temporal)   Ht 5' (1.524 m)   Wt (!) 372 lb 3.2 oz (168.8 kg)   SpO2 99%   BMI 72.69 kg/m?  ? ?Wt Readings from Last 3 Encounters:  ?12/06/21 (!) 372 lb 3.2 oz (168.8 kg)  ?12/01/21 (!) 370 lb 12.8 oz (168.2 kg)  ?11/23/21 [facility] Kitchen)  373 lb (169.2 kg)  ? ? ?BP Readings from Last 3 Encounters:  ?12/06/21 132/84  ?12/01/21 127/82  ?11/25/21 116/71  ?  ? ?Physical Exam ?See updated image of left lower extremity attached below: ?Hematoma still impressive.  Erythema has mostly resolved.  Neurovascular exam intact. ? ? ? ? ? ? ? ?   ?Assessment & Plan:  ? ?Problem List Items Addressed This Visit   ?None ?Visit Diagnoses   ? ? Hematoma of left lower leg    -  Primary  ? Cellulitis of left lower leg      ? Pap smear for cervical cancer screening      ? Relevant Orders  ? Ambulatory referral to Gynecology  ? ?  ? ?1. Hematoma of left lower leg ?Stable ?No signs of compartment syndrome ?Still in pain. Recommend f/up with Dr. Sharol Given, her orthopedist, for second opinion.  ? ?2. Cellulitis of left lower leg ?Resolving with cephalexin - finish this antibiotic as directed ? ?3. Pap smear for cervical cancer screening ?Referral to GYN as requested ? ? ? ?This note was prepared with assistance  of Systems analyst. Occasional wrong-word or sound-a-like substitutions may have occurred due to the inherent limitations of voice recognition software. ? ? ?Sherryann Frese M Andee Chivers, PA-C ?

## 2021-12-09 ENCOUNTER — Other Ambulatory Visit: Payer: Self-pay | Admitting: Cardiology

## 2021-12-11 ENCOUNTER — Other Ambulatory Visit: Payer: Self-pay | Admitting: Cardiology

## 2021-12-15 ENCOUNTER — Other Ambulatory Visit: Payer: Self-pay | Admitting: Cardiology

## 2021-12-18 ENCOUNTER — Ambulatory Visit (INDEPENDENT_AMBULATORY_CARE_PROVIDER_SITE_OTHER): Payer: Commercial Managed Care - HMO | Admitting: Orthopedic Surgery

## 2021-12-18 ENCOUNTER — Encounter: Payer: Self-pay | Admitting: Orthopedic Surgery

## 2021-12-18 DIAGNOSIS — S8012XA Contusion of left lower leg, initial encounter: Secondary | ICD-10-CM

## 2021-12-18 NOTE — Progress Notes (Signed)
? ?Office Visit Note ?  ?Patient: Christina Dominguez           ?Date of Birth: [birth year]           ?MRN: [id number] ?Visit Date: 12/18/2021 ?             ?Requested by: Allwardt, Randa Evens, PA-C ?[place],  [place] [address] ?PCP: Allwardt, Randa Evens, PA-C ? ?Chief Complaint  ?Patient presents with  ? Left Leg - Pain  ? ? ? ? ?HPI: ?Patient is a 56 year old woman who was seen for initial evaluation for left leg hematoma.  Patient states she was sitting at a table when the chair collapsed.  Patient feels like the hematoma has resolved but is still quite prominent.  Patient has completed a course of Keflex. ? ?Assessment & Plan: ?Visit Diagnoses:  ?1. Hematoma of left lower leg   ? ? ?Plan: With the persistent large hematoma discussed patient is at risk for developing recurrent infection.  Patient is also at risk of developing necrotic skin ulcer.  We will plan for surgical excisional debridement of the left calf hematoma placement of a Prevena wound VAC with outpatient surgery.  Risk and benefits were discussed including risk of the skin not healing potential for infection potential for additional surgery.  Patient states she understands wished to proceed at this time. ? ?Follow-Up Instructions: Return in about 2 weeks (around 01/01/2022).  ? ?Ortho Exam ? ?Patient is alert, oriented, no adenopathy, well-dressed, normal affect, normal respiratory effort. ?Examination patient has a palpable dorsalis pedis pulse she has a large swollen hematoma over the anterior lateral aspect of her left leg.  This measures 9 cm in diameter and 5 cm protruding.  Patient can wiggle her toes and move her ankle. ? ?Imaging: ?No results found. ?No images are attached to the encounter. ? ?Labs: ?Lab Results  ?Component Value Date  ? HGBA1C 5.9 (H) 08/20/2021  ? HGBA1C 6.0 (H) 03/22/2020  ? HGBA1C 6.0 (H) 12/21/2019  ? CRP 1.6 (H) 10/09/2021  ? REPTSTATUS 03/25/2021 FINAL 03/20/2021  ? REPTSTATUS 03/25/2021 FINAL 03/20/2021  ?  CULT  03/20/2021  ?  NO GROWTH 5 DAYS ?Performed at [facility], [place] [address] [place], [place] [address] ?  ? CULT  03/20/2021  ?  NO GROWTH 5 DAYS ?Performed at [facility], [place] [address] [place], [place] [address] ?  ? ? ? ?Lab Results  ?Component Value Date  ? ALBUMIN 3.8 10/03/2021  ? ALBUMIN 3.9 08/18/2021  ? ALBUMIN 4.2 06/28/2021  ? ? ?No results found for: MG ?Lab Results  ?Component Value Date  ? VD25OH 33.5 03/22/2020  ? VD25OH 28.6 (L) 12/21/2019  ? ? ?No results found for: PREALBUMIN ? ?  Latest Ref Rng & Units 10/10/2021  ? 12:43 AM 10/06/2021  ?  4:45 PM 10/06/2021  ?  4:40 PM  ?CBC EXTENDED  ?WBC 4.0 - 10.5 K/uL 4.8      ?RBC 3.87 - 5.11 MIL/uL 4.12      ?Hemoglobin 12.0 - 15.0 g/dL 12.1   12.9    ? 12.6   12.6    ?HCT 36.0 - 46.0 % 37.2   38.0    ? 37.0   37.0    ?Platelets 150 - 400 K/uL 274      ? ? ? ?There is no height or weight on file to calculate BMI. ? ?Orders:  ?No orders of the defined types were placed  in this encounter. ? ?No orders of the defined types were placed in this encounter. ? ? ? Procedures: ?No procedures performed ? ?Clinical Data: ?No additional findings. ? ?ROS: ? ?All other systems negative, except as noted in the HPI. ?Review of Systems ? ?Objective: ?Vital Signs: There were no vitals taken for this visit. ? ?Specialty Comments:  ?No specialty comments available. ? ?PMFS History: ?Patient Active Problem List  ? Diagnosis Date Noted  ? Wheezing 11/23/2021  ? Snoring 11/23/2021  ? Other secondary pulmonary hypertension ([place]) 11/23/2021  ? Hyponatremia 10/11/2021  ? Dyspnea on exertion   ? Dyslipidemia 10/04/2021  ? 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 Obstructive sleep apnea 08/25/2021  ? RUQ pain 08/19/2021  ? Hepatic steatosis 08/19/2021  ? Hepatomegaly 08/19/2021  ? Aortic atherosclerosis ([place]) 08/19/2021  ? Diverticulosis 08/19/2021  ? Anxiety 08/02/2021  ? Bipolar 1 disorder ([place]) 08/02/2021  ? CHF (congestive heart failure) ([place]) 08/02/2021  ? Depression  08/02/2021  ? Joint pain 08/02/2021  ? Screening for malignant neoplasm of colon 08/02/2021  ? Prediabetes 12/22/2019  ? Vitamin D insufficiency 12/22/2019  ? Class 3 severe obesity with serious comorbidity and body mass index (BMI) greater than or equal to 70 in adult [facility]) 12/21/2019  ? Essential hypertension 04/30/2019  ? Mild intermittent asthma without complication 09/73/5329  ? ?Past Medical History:  ?Diagnosis Date  ? Anxiety   ? Aortic atherosclerosis ([place]) 08/19/2021  ? Asthma   ? Bipolar 1 disorder ([place])   ? CHF (congestive heart failure) ([place])   ? Class 3 severe obesity with serious comorbidity and body mass index (BMI) greater than or equal to 70 in adult [facility]) 12/21/2019  ? Depression   ? Diastolic dysfunction 92/42/6834  ? Diverticulosis 08/19/2021  ? Edema of both lower extremities   ? Hepatic steatosis 08/19/2021  ? Hepatomegaly 08/19/2021  ? High blood pressure   ? Joint pain   ? Prediabetes 12/22/2019  ?  ?Family History  ?Problem Relation Age of Onset  ? Heart Problems Mother   ?     Broken heart syndrome  ? Hypertension Mother   ? Hypercholesterolemia Mother   ? Bone cancer Father   ? Testicular cancer Father   ? Heart attack Sister   ? Diabetes Brother   ? Colon cancer Maternal Grandfather   ? Dementia Paternal Grandmother   ?  ?Past Surgical History:  ?Procedure Laterality Date  ? NO PAST SURGERIES    ? RIGHT/LEFT HEART CATH AND CORONARY ANGIOGRAPHY N/A 10/06/2021  ? Procedure: RIGHT/LEFT HEART CATH AND CORONARY ANGIOGRAPHY;  Surgeon: Jettie Booze, MD;  Location: [place] [facility];  Service: Cardiovascular;  Laterality: N/A;  ? ?Social History  ? ?Occupational History  ? Occupation: Medical illustrator, work from home  ?Tobacco Use  ? Smoking status: Never  ?  Passive exposure: Never  ? Smokeless tobacco: Never  ?Vaping Use  ? Vaping Use: Never used  ?Substance and Sexual Activity  ? Alcohol use: Yes  ?  Alcohol/week: 2.0 standard drinks  ?  Types: 1 Glasses of wine, 1 Standard drinks or  equivalent per week  ?  Comment: occ  ? Drug use: Never  ? Sexual activity: Not on file  ? ? ? ? ? ?

## 2021-12-18 NOTE — Telephone Encounter (Signed)
Rx refill sent to pharmacy.  ?Potassium changed back to original prescription. Verified with pt and she is taking the one tablet daily along with Furosemide 80 mg daily ?

## 2021-12-19 ENCOUNTER — Encounter (HOSPITAL_COMMUNITY): Payer: Self-pay | Admitting: Orthopedic Surgery

## 2021-12-19 ENCOUNTER — Encounter: Payer: Managed Care, Other (non HMO) | Admitting: Radiology

## 2021-12-19 ENCOUNTER — Other Ambulatory Visit: Payer: Self-pay

## 2021-12-19 NOTE — Progress Notes (Addendum)
Ms Sandstrom denies chest pain or shortness of breath. Patient denies having any s/s of Covid in her household.  Patient denies any known exposure to Covid.  ? ?Ms Demonte PCP is Alyssa Allwardt, PA-C,  ?Cardiologist is Dr. Bettina Gavia, ?Pulmonologist is Dr.Jane Loanne Drilling. ? ?Ms Blanchett is waiting on a sleep study, while waiting she is wearing oxygen at bedtime. Patient is wearing oxygen at hs, she reports that she is not as tired, fatigued or sleepy in the daytime. ? ?Ms. Grotz reports that she is bilingual , english and [place], when she is stressed she may speak Spanish and not realize it.  ?

## 2021-12-20 ENCOUNTER — Other Ambulatory Visit: Payer: Self-pay

## 2021-12-20 ENCOUNTER — Ambulatory Visit (HOSPITAL_COMMUNITY): Payer: Commercial Managed Care - HMO | Admitting: Certified Registered"

## 2021-12-20 ENCOUNTER — Encounter (HOSPITAL_COMMUNITY)
Admission: RE | Disposition: A | Discharge status: Home / Self Care | Payer: Self-pay | Source: Home / Self Care | Attending: Orthopedic Surgery

## 2021-12-20 ENCOUNTER — Encounter (HOSPITAL_COMMUNITY): Payer: Self-pay | Admitting: Orthopedic Surgery

## 2021-12-20 ENCOUNTER — Ambulatory Visit (HOSPITAL_COMMUNITY)
Admission: RE | Admit: 2021-12-20 | Discharge: 2021-12-20 | Disposition: A | Discharge status: Home / Self Care | Payer: Commercial Managed Care - HMO | Attending: Orthopedic Surgery | Admitting: Orthopedic Surgery

## 2021-12-20 ENCOUNTER — Ambulatory Visit (HOSPITAL_BASED_OUTPATIENT_CLINIC_OR_DEPARTMENT_OTHER): Payer: Commercial Managed Care - HMO | Admitting: Certified Registered"

## 2021-12-20 DIAGNOSIS — I509 Heart failure, unspecified: Secondary | ICD-10-CM

## 2021-12-20 DIAGNOSIS — I110 Hypertensive heart disease with heart failure: Secondary | ICD-10-CM | POA: Diagnosis not present

## 2021-12-20 DIAGNOSIS — J45909 Unspecified asthma, uncomplicated: Secondary | ICD-10-CM | POA: Diagnosis not present

## 2021-12-20 DIAGNOSIS — Z6841 Body Mass Index (BMI) 40.0 and over, adult: Secondary | ICD-10-CM | POA: Diagnosis not present

## 2021-12-20 DIAGNOSIS — S8012XA Contusion of left lower leg, initial encounter: Secondary | ICD-10-CM

## 2021-12-20 DIAGNOSIS — X58XXXA Exposure to other specified factors, initial encounter: Secondary | ICD-10-CM | POA: Diagnosis not present

## 2021-12-20 DIAGNOSIS — G4730 Sleep apnea, unspecified: Secondary | ICD-10-CM | POA: Diagnosis not present

## 2021-12-20 HISTORY — DX: Unspecified osteoarthritis, unspecified site: M19.90

## 2021-12-20 HISTORY — PX: I & D EXTREMITY: SHX5045

## 2021-12-20 HISTORY — PX: APPLICATION OF WOUND VAC: SHX5189

## 2021-12-20 HISTORY — DX: Prediabetes: R73.03

## 2021-12-20 LAB — BASIC METABOLIC PANEL
Anion gap: 11 (ref 5–15)
BUN: 28 mg/dL — ABNORMAL HIGH (ref 6–20)
CO2: 28 mmol/L (ref 22–32)
Calcium: 9.1 mg/dL (ref 8.9–10.3)
Chloride: 99 mmol/L (ref 98–111)
Creatinine, Ser: 0.98 mg/dL (ref 0.44–1.00)
GFR, Estimated: >60 mL/min (ref >60)
Glucose, Bld: 88 mg/dL (ref 70–99)
Potassium: 3.4 mmol/L — ABNORMAL LOW (ref 3.5–5.1)
Sodium: 138 mmol/L (ref 135–145)

## 2021-12-20 LAB — NO BLOOD PRODUCTS

## 2021-12-20 LAB — CBC
HCT: 38 % (ref 36.0–46.0)
Hemoglobin: 12.1 g/dL (ref 12.0–15.0)
MCH: 30.1 pg (ref 26.0–34.0)
MCHC: 31.8 g/dL (ref 30.0–36.0)
MCV: 94.5 fL (ref 80.0–100.0)
Platelets: 334 10*3/uL (ref 150–400)
RBC: 4.02 MIL/uL (ref 3.87–5.11)
RDW: 14.2 % (ref 11.5–15.5)
WBC: 8.3 10*3/uL (ref 4.0–10.5)
nRBC: 0 % (ref 0.0–0.2)

## 2021-12-20 SURGERY — IRRIGATION AND DEBRIDEMENT EXTREMITY
Anesthesia: General | Laterality: Left

## 2021-12-20 MED ORDER — LIDOCAINE HCL (PF) 1 % IJ SOLN
INTRAMUSCULAR | Status: AC
  Filled 2021-12-20: qty 30

## 2021-12-20 MED ORDER — 0.9 % SODIUM CHLORIDE (POUR BTL) OPTIME
TOPICAL | Status: DC | PRN
  Administered 2021-12-20: 1000 mL

## 2021-12-20 MED ORDER — CEFAZOLIN IN SODIUM CHLORIDE 3-0.9 GM/100ML-% IV SOLN
3.0000 g | INTRAVENOUS | Status: AC
  Administered 2021-12-20: 3 g via INTRAVENOUS
  Filled 2021-12-20: qty 100

## 2021-12-20 MED ORDER — MEPERIDINE HCL 25 MG/ML IJ SOLN: 6.2500 mg | INTRAMUSCULAR | Status: DC | PRN

## 2021-12-20 MED ORDER — CHLORHEXIDINE GLUCONATE 0.12 % MT SOLN
15.0000 mL | Freq: Once | OROMUCOSAL | Status: AC
  Administered 2021-12-20: 15 mL via OROMUCOSAL
  Filled 2021-12-20: qty 15

## 2021-12-20 MED ORDER — ORAL CARE MOUTH RINSE: 15.0000 mL | Freq: Once | OROMUCOSAL | Status: AC

## 2021-12-20 MED ORDER — FENTANYL CITRATE (PF) 250 MCG/5ML IJ SOLN
INTRAMUSCULAR | Status: AC
  Filled 2021-12-20: qty 5

## 2021-12-20 MED ORDER — PROPOFOL 10 MG/ML IV BOLUS
INTRAVENOUS | Status: AC
  Filled 2021-12-20: qty 20

## 2021-12-20 MED ORDER — LIDOCAINE 2% (20 MG/ML) 5 ML SYRINGE
INTRAMUSCULAR | Status: AC
  Filled 2021-12-20: qty 5

## 2021-12-20 MED ORDER — OXYCODONE HCL 5 MG/5ML PO SOLN: 5.0000 mg | Freq: Once | ORAL | Status: AC | PRN

## 2021-12-20 MED ORDER — DEXAMETHASONE SODIUM PHOSPHATE 10 MG/ML IJ SOLN
INTRAMUSCULAR | Status: DC | PRN
Start: 2021-12-20 — End: 2021-12-20
  Administered 2021-12-20: 10 mg via INTRAVENOUS

## 2021-12-20 MED ORDER — FENTANYL CITRATE (PF) 100 MCG/2ML IJ SOLN
INTRAMUSCULAR | Status: DC | PRN
  Administered 2021-12-20 (×2): 25 ug via INTRAVENOUS
  Administered 2021-12-20: 50 ug via INTRAVENOUS

## 2021-12-20 MED ORDER — MIDAZOLAM HCL 2 MG/2ML IJ SOLN
INTRAMUSCULAR | Status: AC
  Filled 2021-12-20: qty 2

## 2021-12-20 MED ORDER — PROPOFOL 10 MG/ML IV BOLUS
INTRAVENOUS | Status: DC | PRN
  Administered 2021-12-20: 200 mg via INTRAVENOUS

## 2021-12-20 MED ORDER — MIDAZOLAM HCL 2 MG/2ML IJ SOLN
INTRAMUSCULAR | Status: AC
Start: 2021-12-20 — End: 2021-12-20
  Filled 2021-12-20: qty 2

## 2021-12-20 MED ORDER — OXYCODONE HCL 5 MG PO TABS
ORAL_TABLET | ORAL | Status: AC
  Filled 2021-12-20: qty 1

## 2021-12-20 MED ORDER — LIDOCAINE 2% (20 MG/ML) 5 ML SYRINGE
INTRAMUSCULAR | Status: DC | PRN
  Administered 2021-12-20: 100 mg via INTRAVENOUS

## 2021-12-20 MED ORDER — FENTANYL CITRATE (PF) 100 MCG/2ML IJ SOLN
INTRAMUSCULAR | Status: AC
  Filled 2021-12-20: qty 2

## 2021-12-20 MED ORDER — LACTATED RINGERS IV SOLN: INTRAVENOUS | Status: DC

## 2021-12-20 MED ORDER — HYDROMORPHONE HCL 1 MG/ML IJ SOLN
INTRAMUSCULAR | Status: AC
  Filled 2021-12-20: qty 1

## 2021-12-20 MED ORDER — ONDANSETRON HCL 4 MG/2ML IJ SOLN
INTRAMUSCULAR | Status: DC | PRN
  Administered 2021-12-20: 4 mg via INTRAVENOUS

## 2021-12-20 MED ORDER — OXYCODONE HCL 5 MG PO TABS
5.0000 mg | ORAL_TABLET | Freq: Once | ORAL | Status: AC | PRN
  Administered 2021-12-20: 5 mg via ORAL

## 2021-12-20 MED ORDER — AMISULPRIDE (ANTIEMETIC) 5 MG/2ML IV SOLN: 10.0000 mg | Freq: Once | INTRAVENOUS | Status: DC | PRN

## 2021-12-20 MED ORDER — HYDROCODONE-ACETAMINOPHEN 5-325 MG PO TABS: 1.0000 | ORAL_TABLET | ORAL | 0 refills | Status: DC | PRN

## 2021-12-20 MED ORDER — PROMETHAZINE HCL 25 MG/ML IJ SOLN: 6.2500 mg | INTRAMUSCULAR | Status: DC | PRN

## 2021-12-20 MED ORDER — MIDAZOLAM HCL 5 MG/5ML IJ SOLN
INTRAMUSCULAR | Status: DC | PRN
  Administered 2021-12-20: 2 mg via INTRAVENOUS

## 2021-12-20 MED ORDER — HYDROMORPHONE HCL 1 MG/ML IJ SOLN
0.2500 mg | INTRAMUSCULAR | Status: DC | PRN
  Administered 2021-12-20: 0.5 mg via INTRAVENOUS

## 2021-12-20 SURGICAL SUPPLY — 37 items
BAG COUNTER SPONGE SURGICOUNT (BAG) IMPLANT
BLADE SURG 21 STRL SS (BLADE) ×2 IMPLANT
BNDG COHESIVE 6X5 TAN STRL LF (GAUZE/BANDAGES/DRESSINGS) IMPLANT
BNDG GAUZE ELAST 4 BULKY (GAUZE/BANDAGES/DRESSINGS) ×2 IMPLANT
COVER SURGICAL LIGHT HANDLE (MISCELLANEOUS) ×4 IMPLANT
DRAPE DERMATAC (DRAPES) ×2 IMPLANT
DRAPE INCISE IOBAN 66X45 STRL (DRAPES) ×1 IMPLANT
DRAPE U-SHAPE 47X51 STRL (DRAPES) ×2 IMPLANT
DRESSING PEEL AND PLAC PRVNA20 (GAUZE/BANDAGES/DRESSINGS) IMPLANT
DRSG ADAPTIC 3X8 NADH LF (GAUZE/BANDAGES/DRESSINGS) ×1 IMPLANT
DRSG PEEL AND PLACE PREVENA 20 (GAUZE/BANDAGES/DRESSINGS) ×2
DURAPREP 26ML APPLICATOR (WOUND CARE) ×2 IMPLANT
ELECT REM PT RETURN 9FT ADLT (ELECTROSURGICAL)
ELECTRODE REM PT RTRN 9FT ADLT (ELECTROSURGICAL) IMPLANT
GAUZE SPONGE 4X4 12PLY STRL (GAUZE/BANDAGES/DRESSINGS) ×1 IMPLANT
GLOVE BIOGEL PI IND STRL 9 (GLOVE) ×1 IMPLANT
GLOVE BIOGEL PI INDICATOR 9 (GLOVE) ×1
GLOVE SURG ORTHO 9.0 STRL STRW (GLOVE) ×2 IMPLANT
GOWN STRL REUS W/ TWL XL LVL3 (GOWN DISPOSABLE) ×2 IMPLANT
GOWN STRL REUS W/TWL XL LVL3 (GOWN DISPOSABLE) ×4
HANDPIECE INTERPULSE COAX TIP (DISPOSABLE)
KIT BASIN OR (CUSTOM PROCEDURE TRAY) ×2 IMPLANT
KIT DRSG PREVENA PLUS 7DAY 125 (MISCELLANEOUS) ×1 IMPLANT
KIT TURNOVER KIT B (KITS) ×2 IMPLANT
MANIFOLD NEPTUNE II (INSTRUMENTS) ×2 IMPLANT
NS IRRIG 1000ML POUR BTL (IV SOLUTION) ×2 IMPLANT
PACK ORTHO EXTREMITY (CUSTOM PROCEDURE TRAY) ×2 IMPLANT
PAD ARMBOARD 7.5X6 YLW CONV (MISCELLANEOUS) ×4 IMPLANT
SET HNDPC FAN SPRY TIP SCT (DISPOSABLE) IMPLANT
SPONGE T-LAP 18X18 ~~LOC~~+RFID (SPONGE) ×1 IMPLANT
STOCKINETTE IMPERVIOUS 9X36 MD (GAUZE/BANDAGES/DRESSINGS) ×1 IMPLANT
SUT ETHILON 2 0 PSLX (SUTURE) ×3 IMPLANT
SWAB COLLECTION DEVICE MRSA (MISCELLANEOUS) ×1 IMPLANT
SWAB CULTURE ESWAB REG 1ML (MISCELLANEOUS) IMPLANT
TOWEL GREEN STERILE (TOWEL DISPOSABLE) ×2 IMPLANT
TUBE CONNECTING 12X1/4 (SUCTIONS) ×2 IMPLANT
YANKAUER SUCT BULB TIP NO VENT (SUCTIONS) ×2 IMPLANT

## 2021-12-20 NOTE — Interval H&P Note (Signed)
History and Physical Interval Note: ? ?12/20/2021 ?10:37 AM ? ?Christina Dominguez  has presented today for surgery, with the diagnosis of Hematoma Left Leg.  The various methods of treatment have been discussed with the patient and family. After consideration of risks, benefits and other options for treatment, the patient has consented to  Procedure(s): ?EXCISIONAL DEBRIDEMENT HEMATOMA LEFT LEG (Left) ?APPLICATION OF WOUND VAC (Left) as a surgical intervention.  The patient's history has been reviewed, patient examined, no change in status, stable for surgery.  I have reviewed the patient's chart and labs.  Questions were answered to the patient's satisfaction.   ? ? ?[name] ? ? ?

## 2021-12-20 NOTE — Op Note (Addendum)
12/20/2021 ? ?10:29 AM ? ?PATIENT:  Christina Dominguez   ? ?PRE-OPERATIVE DIAGNOSIS:  Hematoma Left Leg ? ?POST-OPERATIVE DIAGNOSIS:  Same ? ?PROCEDURE:  EXCISIONAL DEBRIDEMENT HEMATOMA LEFT LEG, APPLICATION OF WOUND VAC ?Local tissue rearrangement for wound closure 20 x 5 cm. ? ?SURGEON:  [name], MD ? ?PHYSICIAN ASSISTANT:None ?ANESTHESIA:   General ? ?PREOPERATIVE INDICATIONS:  Christina Dominguez is a  56 y.o. female with a diagnosis of Hematoma Left Leg who failed conservative measures and elected for surgical management.   ? ?The risks benefits and alternatives were discussed with the patient preoperatively including but not limited to the risks of infection, bleeding, nerve injury, cardiopulmonary complications, the need for revision surgery, among others, and the patient was willing to proceed. ? ?OPERATIVE IMPLANTS: None ? ?'@ENCIMAGES'$ @ ? ?OPERATIVE FINDINGS: Large hematoma with necrotic fascia.  Tissue sent for cultures. ? ?OPERATIVE PROCEDURE: Patient was brought the operating room and underwent a general LMA anesthetic.  After adequate levels anesthesia obtained patient's left lower extremity was prepped using DuraPrep draped into a sterile field a timeout was called.  An elliptical incision was made around the necrotic ulcer.  This left a wound that was 20 x 5 cm.  The necrotic fascia and hematoma was sent for cultures.  The wound was irrigated with normal saline the tissue margins were clear.  The incision was extended and local tissue rearrangement was used to close the wound 20 x 5 cm.  A Prevena 20 cm wound VAC was applied this had a good suction fit patient was extubated taken the PACU in stable condition. ? ?Debridement type: Excisional Debridement ? ?Side: left ? ?Body Location: leg  ? ?Tools used for debridement: scalpel and rongeur ? ?Pre-debridement Wound size (cm):   Length: 1        Width: 1     Depth: 1  ? ?Post-debridement Wound size (cm):   Length: 20        Width: 5     Depth: 3   ? ?Debridement depth beyond dead/damaged tissue down to healthy viable tissue: yes ? ?Tissue layer involved: skin, subcutaneous tissue, muscle / fascia ? ?Nature of tissue removed: Slough, Necrotic, Devitalized Tissue, Non-viable tissue, and Purulence ? ?Irrigation volume: 1 liter    ? ?Irrigation fluid type: Normal Saline ? ? ?DISCHARGE PLANNING: ? ?Antibiotic duration: Preoperative antibiotics 3 g Salt ? ?Weightbearing: Weightbearing as tolerated ? ?Pain medication: Prescription for Vicodin ? ?Dressing care/ Wound VAC: Discharged with wound VAC ? ?Ambulatory devices: As needed ? ?Discharge to: Home. ? ?Follow-up: In the office 1 week post operative. ? ? ? ? ? ? ?  ?

## 2021-12-20 NOTE — Transfer of Care (Signed)
Immediate Anesthesia Transfer of Care Note ? ?Patient: Christina Dominguez ? ?Procedure(s) Performed: EXCISIONAL DEBRIDEMENT HEMATOMA LEFT LEG (Left) ?APPLICATION OF WOUND VAC (Left) ? ?Patient Location: PACU ? ?Anesthesia Type:General ? ?Level of Consciousness: awake, alert  and oriented ? ?Airway & Oxygen Therapy: Patient Spontanous Breathing ? ?Post-op Assessment: Report given to RN and Post -op Vital signs reviewed and stable ? ?Post vital signs: Reviewed and stable ? ?Last Vitals:  ?Vitals Value Taken Time  ?BP    ?Temp    ?Pulse    ?Resp    ?SpO2    ? ? ?Last Pain:  ?Vitals:  ? 12/20/21 0857  ?TempSrc:   ?PainSc: 0-No pain  ?   ? ?  ? ?Complications: No notable events documented. ?

## 2021-12-20 NOTE — Anesthesia Procedure Notes (Signed)
Procedure Name: LMA Insertion ?Date/Time: 12/20/2021 9:47 AM ?Performed by: Gwyndolyn Saxon, CRNA ?Patient Re-evaluated:Patient Re-evaluated prior to induction ?Oxygen Delivery Method: Circle system utilized ?Preoxygenation: Pre-oxygenation with 100% oxygen ?Induction Type: IV induction ?Ventilation: Mask ventilation without difficulty ?LMA: LMA inserted ?LMA Size: 3.0 ?Number of attempts: 1 ?Placement Confirmation: positive ETCO2 and breath sounds checked- equal and bilateral ?Tube secured with: Tape ?Dental Injury: Teeth and Oropharynx as per pre-operative assessment  ? ? ? ? ?

## 2021-12-20 NOTE — Anesthesia Postprocedure Evaluation (Signed)
Anesthesia Post Note ? ?Patient: Christina Dominguez ? ?Procedure(s) Performed: EXCISIONAL DEBRIDEMENT HEMATOMA LEFT LEG (Left) ?APPLICATION OF WOUND VAC (Left) ? ?  ? ?Patient location during evaluation: PACU ?Anesthesia Type: General ?Level of consciousness: awake and alert ?Pain management: pain level controlled ?Vital Signs Assessment: post-procedure vital signs reviewed and stable ?Respiratory status: spontaneous breathing, nonlabored ventilation and respiratory function stable ?Cardiovascular status: blood pressure returned to baseline and stable ?Postop Assessment: no apparent nausea or vomiting ?Anesthetic complications: no ? ? ?No notable events documented. ? ?Last Vitals:  ?Vitals:  ? 12/20/21 1030 12/20/21 1045  ?BP: 116/73 113/80  ?Pulse: 72 72  ?Resp: 19 19  ?Temp:  36.6 ?C  ?SpO2: 94% 93%  ?  ?Last Pain:  ?Vitals:  ? 12/20/21 1045  ?TempSrc:   ?PainSc: 4   ? ? ?  ?  ?  ?  ?  ?  ? ?Lynda Rainwater ? ? ? ? ?

## 2021-12-20 NOTE — H&P (Signed)
Christina Dominguez is an 56 y.o. female.   ?Chief Complaint: Hematoma left leg ?HPI: Patient is a 56 year old woman who was seen for initial evaluation for left leg hematoma.  Patient states she was sitting at a table when the chair collapsed.  Patient feels like the hematoma has resolved but is still quite prominent.  Patient has completed a course of Keflex. ? ?Past Medical History:  ?Diagnosis Date  ? Anxiety   ? Aortic atherosclerosis ([place]) 08/19/2021  ? Arthritis   ? Asthma   ? Bipolar 1 disorder ([place])   ? CHF (congestive heart failure) ([place])   ? Class 3 severe obesity with serious comorbidity and body mass index (BMI) greater than or equal to 70 in adult [facility]) 12/21/2019  ? Depression   ? Diastolic dysfunction 26/94/8546  ? Diverticulosis 08/19/2021  ? Edema of both lower extremities   ? Hepatic steatosis 08/19/2021  ? Hepatomegaly 08/19/2021  ? High blood pressure   ? Joint pain   ? Pre-diabetes   ? Prediabetes 12/22/2019  ? ? ?Past Surgical History:  ?Procedure Laterality Date  ? NO PAST SURGERIES    ? RIGHT/LEFT HEART CATH AND CORONARY ANGIOGRAPHY N/A 10/06/2021  ? Procedure: RIGHT/LEFT HEART CATH AND CORONARY ANGIOGRAPHY;  Surgeon: Jettie Booze, MD;  Location: [place] [facility];  Service: Cardiovascular;  Laterality: N/A;  ? ? ?Family History  ?Problem Relation Age of Onset  ? Heart Problems Mother   ?     Broken heart syndrome  ? Hypertension Mother   ? Hypercholesterolemia Mother   ? Bone cancer Father   ? Testicular cancer Father   ? Heart attack Sister   ? Diabetes Brother   ? Colon cancer Maternal Grandfather   ? Dementia Paternal Grandmother   ? ?Social History:  reports that she has never smoked. She has never been exposed to tobacco smoke. She has never used smokeless tobacco. She reports that she does not currently use alcohol. She reports that she does not use drugs. ? ?Allergies:  ?Allergies  ?Allergen Reactions  ? Aspirin Nausea And Vomiting  ? Eggs Or Egg-Derived Products Swelling  ?  Influenza Vaccines Swelling  ? ? ?No medications prior to admission.  ? ? ?No results found for this or any previous visit (from the past 48 hour(s)). ?No results found. ? ?Review of Systems  ?All other systems reviewed and are negative. ? ?There were no vitals taken for this visit. ?Physical Exam  ?Patient is alert, oriented, no adenopathy, well-dressed, normal affect, normal respiratory effort. ?Examination patient has a palpable dorsalis pedis pulse she has a large swollen hematoma over the anterior lateral aspect of her left leg.  This measures 9 cm in diameter and 5 cm protruding.  Patient can wiggle her toes and move her ankle. ?Assessment/Plan ?1. Hematoma of left lower leg   ?  ?  ?Plan: With the persistent large hematoma discussed patient is at risk for developing recurrent infection.  Patient is also at risk of developing necrotic skin ulcer.  We will plan for surgical excisional debridement of the left calf hematoma placement of a Prevena wound VAC with outpatient surgery.  Risk and benefits were discussed including risk of the skin not healing potential for infection potential for additional surgery.  Patient states she understands wished to proceed at this time. ? ?[name], MD ?12/20/2021, 7:04 AM ? ? ? ?

## 2021-12-20 NOTE — Anesthesia Preprocedure Evaluation (Addendum)
Anesthesia Evaluation  ?Patient identified by MRN, date of birth, ID band ?Patient awake ? ? ? ?Reviewed: ?Allergy & Precautions, NPO status , Patient's Chart, lab work & pertinent test results ? ?Airway ?Mallampati: II ? ?TM Distance: >3 FB ?Neck ROM: Full ? ? ? Dental ? ?(+) Poor Dentition, Chipped, Missing ?  ?Pulmonary ?asthma , sleep apnea ,  ?  ?Pulmonary exam normal ?breath sounds clear to auscultation ? ? ? ? ? ? Cardiovascular ?hypertension, Pt. on medications ?+CHF  ?Normal cardiovascular exam ?Rhythm:Regular Rate:Normal ? ? ?  ?Neuro/Psych ?Anxiety Depression Bipolar Disorder negative neurological ROS ? negative psych ROS  ? GI/Hepatic ?negative GI ROS, Neg liver ROS,   ?Endo/Other  ?Morbid obesity ? Renal/GU ?negative Renal ROS  ?negative genitourinary ?  ?Musculoskeletal ? ?(+) Arthritis , Osteoarthritis,   ? Abdominal ?(+) + obese,   ?Peds ?negative pediatric ROS ?(+)  Hematology ?negative hematology ROS ?(+)   ?Anesthesia Other Findings ? ? Reproductive/Obstetrics ?negative OB ROS ? ?  ? ? ? ? ? ? ? ? ? ? ? ? ? ?  ?  ? ? ? ? ? ? ? ?Anesthesia Physical ?Anesthesia Plan ? ?ASA: 3 ? ?Anesthesia Plan: General  ? ?Post-op Pain Management: Dilaudid IV and Minimal or no pain anticipated  ? ?Induction: Intravenous ? ?PONV Risk Score and Plan: 3 and Ondansetron, Dexamethasone, Midazolam and Treatment may vary due to age or medical condition ? ?Airway Management Planned: LMA ? ?Additional Equipment:  ? ?Intra-op Plan:  ? ?Post-operative Plan: Extubation in OR ? ?Informed Consent: I have reviewed the patients History and Physical, chart, labs and discussed the procedure including the risks, benefits and alternatives for the proposed anesthesia with the patient or authorized representative who has indicated his/her understanding and acceptance.  ? ? ? ?Dental advisory given ? ?Plan Discussed with: CRNA ? ?Anesthesia Plan Comments:   ? ? ? ? ? ? ?Anesthesia Quick Evaluation ? ?

## 2021-12-21 ENCOUNTER — Telehealth: Payer: Self-pay | Admitting: Cardiology

## 2021-12-21 ENCOUNTER — Encounter (HOSPITAL_COMMUNITY): Payer: Self-pay | Admitting: Orthopedic Surgery

## 2021-12-21 NOTE — Telephone Encounter (Signed)
Patient is requesting to switch providers from Dr. Agustin Cree to Dr. Oval Linsey.  Please advise as able,  Thank you

## 2021-12-22 ENCOUNTER — Telehealth: Payer: Self-pay

## 2021-12-22 NOTE — Telephone Encounter (Signed)
Patient called triage. She said that she a surgical procedure this Wednesday 12/20/2021 of excisional debridement of hematome and wound vac application on the left leg. She said that she has been experiencing vomiting, chills, and constipation since the surgery. She is currently only taking Tylenol for pain relief. Her last Hydrocodone dose was Wednesday afternoon. I ask about a fever, and she states that she has not taken her temperature. Please advise with any advise or recommendations... [phone]

## 2021-12-22 NOTE — Telephone Encounter (Signed)
I SW pt, she says her symptoms started yesterday evening with vomiting and abdominal pain. She is taking stool softners and drinking coffee. She has had a BM, no more vomiting. She says she is not running a fever. I advised it sounds like could be SE of the anesthesia and she agreed. I stated if her symptoms started again and increased she needed to proceed to the ER. She agreed.

## 2021-12-25 LAB — AEROBIC/ANAEROBIC CULTURE W GRAM STAIN (SURGICAL/DEEP WOUND)
Culture: NO GROWTH
Gram Stain: NONE SEEN

## 2021-12-26 ENCOUNTER — Ambulatory Visit: Payer: Commercial Managed Care - HMO | Admitting: Cardiology

## 2021-12-26 ENCOUNTER — Encounter: Payer: Self-pay | Admitting: Cardiology

## 2021-12-26 VITALS — BP 134/78 | HR 86 | Ht 60.0 in | Wt 376.0 lb

## 2021-12-26 DIAGNOSIS — I2729 Other secondary pulmonary hypertension: Secondary | ICD-10-CM

## 2021-12-26 DIAGNOSIS — I10 Essential (primary) hypertension: Secondary | ICD-10-CM | POA: Diagnosis not present

## 2021-12-26 DIAGNOSIS — G4733 Obstructive sleep apnea (adult) (pediatric): Secondary | ICD-10-CM

## 2021-12-26 DIAGNOSIS — R0609 Other forms of dyspnea: Secondary | ICD-10-CM

## 2021-12-26 DIAGNOSIS — I5032 Chronic diastolic (congestive) heart failure: Secondary | ICD-10-CM

## 2021-12-26 NOTE — Patient Instructions (Addendum)
Medication Instructions:  Your physician recommends that you continue on your current medications as directed. Please refer to the Current Medication list given to you today.  *If you need a refill on your cardiac medications before your next appointment, please call your pharmacy*   Lab Work:  Pro BNP, [facility] 2nd Floor, [address]  If you have labs (blood work) drawn today and your tests are completely normal, you will receive your results only by: MyChart Message (if you have MyChart) OR A paper copy in the mail If you have any lab test that is abnormal or we need to change your treatment, we will call you to review the results.   Testing/Procedures: None Ordered   Follow-Up: At [facility], you and your health needs are our priority.  As part of our continuing mission to provide you with exceptional heart care, we have created designated Provider Care Teams.  These Care Teams include your primary Cardiologist (physician) and Advanced Practice Providers (APPs -  Physician Assistants and Nurse Practitioners) who all work together to provide you with the care you need, when you need it.  We recommend signing up for the patient portal called "MyChart".  Sign up information is provided on this After Visit Summary.  MyChart is used to connect with patients for Virtual Visits (Telemedicine).  Patients are able to view lab/test results, encounter notes, upcoming appointments, etc.  Non-urgent messages can be sent to your provider as well.   To learn more about what you can do with MyChart, go to [email or web address].    Your next appointment:   3 month(s)  The format for your next appointment:   In Person  Provider:   Jenne Campus, MD    Other Instructions NA

## 2021-12-26 NOTE — Addendum Note (Signed)
Addended by: Jacobo Forest D on: 12/26/2021 02:59 PM   Modules accepted: Orders

## 2021-12-26 NOTE — Progress Notes (Signed)
Cardiology Office Note:    Date:  12/26/2021   ID:  MATRACA HUNKINS, DOB 25-Aug-1965, MRN [id number]  PCP:  Fredirick Lathe, PA-C  Cardiologist:  Jenne Campus, MD    Referring MD: Fredirick Lathe, PA-C   Chief Complaint  Patient presents with   Follow-up    History of Present Illness:    Christina Dominguez is a 56 y.o. female with past medical history significant for morbid obesity, diastolic congestive heart failure, pulmonary hypertension with mean pulmonary pressure in the neighborhood of 45 mmHg based on cardiac catheterization, prediabetes, essential hypertension.  She was referred to [place] for management of her diastolic congestive heart failure as well as pulmonary hypertension.  She fell down recently end up having injury to the left leg with large hematoma that being recently removed she is getting better from this point review but still problematic.  Ability to exercise is limited because of leg problem.  She does have significant desaturation during the night and she is wearing oxygen at night with significant improvement on top of that she is scheduled to have a sleep study within next few days.  She says she is feeling okay maybe slightly better after oxygen being used at night.  Denies have any chest pain tightness squeezing pressure burning chest no palpitations dizziness swelling of lower extremities  Past Medical History:  Diagnosis Date   Anxiety    Aortic atherosclerosis ([place]) 08/19/2021   Arthritis    Asthma    Bipolar 1 disorder (HCC)    CHF (congestive heart failure) (HCC)    Class 3 severe obesity with serious comorbidity and body mass index (BMI) greater than or equal to 70 in adult [facility]) 12/21/2019   Depression    Diastolic dysfunction 67/59/1638   Diverticulosis 08/19/2021   Edema of both lower extremities    Hepatic steatosis 08/19/2021   Hepatomegaly 08/19/2021   High blood pressure    Joint pain    Pre-diabetes    Prediabetes 12/22/2019     Past Surgical History:  Procedure Laterality Date   APPLICATION OF WOUND VAC Left 12/20/2021   Procedure: APPLICATION OF WOUND VAC;  Surgeon: [name], MD;  Location: [place];  Service: Orthopedics;  Laterality: Left;   I & D EXTREMITY Left 12/20/2021   Procedure: EXCISIONAL DEBRIDEMENT HEMATOMA LEFT LEG;  Surgeon: [name], MD;  Location: [place];  Service: Orthopedics;  Laterality: Left;   NO PAST SURGERIES     RIGHT/LEFT HEART CATH AND CORONARY ANGIOGRAPHY N/A 10/06/2021   Procedure: RIGHT/LEFT HEART CATH AND CORONARY ANGIOGRAPHY;  Surgeon: Jettie Booze, MD;  Location: [place] [facility];  Service: Cardiovascular;  Laterality: N/A;    Current Medications: Current Meds  Medication Sig   acetaminophen (TYLENOL) 650 MG CR tablet Take 1,300 mg by mouth every 8 (eight) hours as needed for pain.   albuterol (VENTOLIN HFA) 108 (90 Base) MCG/ACT inhaler Inhale 2 puffs into the lungs every 4 (four) hours as needed for wheezing or shortness of breath.   atorvastatin (LIPITOR) 40 MG tablet Take 1 tablet (40 mg total) by mouth daily.   CATS CLAW, UNCARIA TOMENTOSA, PO Take 2 capsules by mouth daily.   Cholecalciferol (VITAMIN D) 50 MCG (2000 UT) tablet Take 2,000 Units by mouth daily.   diclofenac Sodium (VOLTAREN) 1 % GEL Apply 2 g topically daily as needed (pain).   diphenhydrAMINE (BENADRYL) 25 MG tablet Take 50 mg by mouth at bedtime as needed for sleep.  furosemide (LASIX) 80 MG tablet TAKE 1 AND 1/2 TABLET BY MOUTH EVERY DAY FOR 4 DAYS WITH '40MG'$  POTASSIUM, THEN RESUME 1 TABLET DAILY AND '20MG'$  POTASSIUM (Patient taking differently: Take 80 mg by mouth daily.)   HYDROcodone-acetaminophen (NORCO/VICODIN) 5-325 MG tablet Take 1 tablet by mouth every 4 (four) hours as needed. (Patient taking differently: Take 1 tablet by mouth every 4 (four) hours as needed for moderate pain or severe pain.)   OVER THE COUNTER MEDICATION Take 3 tablets by mouth daily. Mobility essentials supplement    potassium chloride SA (KLOR-CON M) 20 MEQ tablet Take 1 tablet (20 mEq total) by mouth daily.   TURMERIC PO Take 1 tablet by mouth daily.   valsartan (DIOVAN) 40 MG tablet Take 1 tablet (40 mg total) by mouth daily.     Allergies:   Aspirin, Eggs or egg-derived products, and Influenza vaccines   Social History   Socioeconomic History   Marital status: Single    Spouse name: Not on file   Number of children: Not on file   Years of education: Not on file   Highest education level: Not on file  Occupational History   Occupation: Medical illustrator, work from home  Tobacco Use   Smoking status: Never    Passive exposure: Never   Smokeless tobacco: Never  Vaping Use   Vaping Use: Never used  Substance and Sexual Activity   Alcohol use: Not Currently    Comment: Maybe 2 glasses of wine a month   Drug use: Never   Sexual activity: Not on file  Other Topics Concern   Not on file  Social History Narrative   Not on file   Social Determinants of Health   Financial Resource Strain: Medium Risk   Difficulty of Paying Living Expenses: Somewhat hard  Food Insecurity: No Food Insecurity   Worried About Charity fundraiser in the Last Year: Never true   [place] in the Last Year: Never true  Transportation Needs: No Transportation Needs   Lack of Transportation (Medical): No   Lack of Transportation (Non-Medical): No  Physical Activity: Not on file  Stress: Not on file  Social Connections: Not on file     Family History: The patient's family history includes Bone cancer in her father; Colon cancer in her maternal grandfather; Dementia in her paternal grandmother; Diabetes in her brother; Heart Problems in her mother; Heart attack in her sister; Hypercholesterolemia in her mother; Hypertension in her mother; Testicular cancer in her father. ROS:   Please see the history of present illness.    All 14 point review of systems negative except as described per history of present  illness  EKGs/Labs/Other Studies Reviewed:      Recent Labs: 10/03/2021: ALT 19; B Natriuretic Peptide 46.0 10/24/2021: NT-Pro BNP 128 12/20/2021: BUN 28; Creatinine, Ser 0.98; Hemoglobin 12.1; Platelets 334; Potassium 3.4; Sodium 138  Recent Lipid Panel    Component Value Date/Time   CHOL 158 12/21/2019 1350   TRIG 58 12/21/2019 1350   HDL 58 12/21/2019 1350   LDLCALC 88 12/21/2019 1350    Physical Exam:    VS:  BP 134/78 (BP Location: Right Arm, Patient Position: Bed low/side rails up)   Pulse 86   Ht 5' (1.524 m)   Wt (!) 376 lb (170.6 kg)   SpO2 94%   BMI 73.43 kg/m     Wt Readings from Last 3 Encounters:  12/26/21 (!) 376 lb (170.6 kg)  12/20/21 Mar[place] Kitchen)  371 lb (168.3 kg)  12/06/21 (!) 372 lb 3.2 oz (168.8 kg)     GEN:  Well nourished, well developed in no acute distress HEENT: Normal NECK: No JVD; No carotid bruits LYMPHATICS: No lymphadenopathy CARDIAC: RRR, no murmurs, no rubs, no gallops RESPIRATORY:  Clear to auscultation without rales, wheezing or rhonchi  ABDOMEN: Soft, non-tender, non-distended MUSCULOSKELETAL:  No edema; No deformity  SKIN: Warm and dry LOWER EXTREMITIES: no swelling NEUROLOGIC:  Alert and oriented x 3 PSYCHIATRIC:  Normal affect   ASSESSMENT:    1. Chronic diastolic congestive heart failure ([place])   2. Essential hypertension   3. Other secondary pulmonary hypertension ([place])   4. Obstructive sleep apnea   5. Dyspnea on exertion    PLAN:    In order of problems listed above:  Chronic diastolic congestive heart failure.  She gained few pounds since I seen her last time.  I will check proBNP and Chem-7 to decide if we can increase diuresis. Pulmonary hypertension probably group 2 and 3.  Weight and try to correct her diastolic congestive heart failure best they can, she is being scheduled to have a pulmonary sleep study done which will be very beneficial in management of her sleep apnea will be essential for this situation obviously  weight loss can be critically essential as well. Dyspnea on exertion multifactorial morbid obesity, diastolic congestive heart failure, pulmonary hypertension all play significant role here.  Dyslipidemia I did review her K PN which show me her LDL 81 HDL 50 however this is all data from 2021 we will repeat fasting lipid profile   Medication Adjustments/Labs and Tests Ordered: Current medicines are reviewed at length with the patient today.  Concerns regarding medicines are outlined above.  No orders of the defined types were placed in this encounter.  Medication changes: No orders of the defined types were placed in this encounter.   Signed, Park Liter, MD, [facility] 12/26/2021 2:51 PM    [facility]

## 2021-12-27 LAB — BASIC METABOLIC PANEL
BUN/Creatinine Ratio: 31 — ABNORMAL HIGH (ref 9–23)
BUN: 28 mg/dL — ABNORMAL HIGH (ref 6–24)
CO2: 26 mmol/L (ref 20–29)
Calcium: 9.7 mg/dL (ref 8.7–10.2)
Chloride: 95 mmol/L — ABNORMAL LOW (ref 96–106)
Creatinine, Ser: 0.89 mg/dL (ref 0.57–1.00)
Glucose: 93 mg/dL (ref 70–99)
Potassium: 3.9 mmol/L (ref 3.5–5.2)
Sodium: 137 mmol/L (ref 134–144)
eGFR: 76 mL/min/{1.73_m2} (ref >59)

## 2021-12-27 LAB — PRO B NATRIURETIC PEPTIDE: NT-Pro BNP: 140 pg/mL (ref 0–287)

## 2021-12-28 ENCOUNTER — Ambulatory Visit (HOSPITAL_BASED_OUTPATIENT_CLINIC_OR_DEPARTMENT_OTHER): Payer: Commercial Managed Care - HMO | Attending: Pulmonary Disease | Admitting: Pulmonary Disease

## 2021-12-28 DIAGNOSIS — R0683 Snoring: Secondary | ICD-10-CM | POA: Diagnosis not present

## 2021-12-28 DIAGNOSIS — G4733 Obstructive sleep apnea (adult) (pediatric): Secondary | ICD-10-CM | POA: Diagnosis present

## 2021-12-28 DIAGNOSIS — G4761 Periodic limb movement disorder: Secondary | ICD-10-CM | POA: Insufficient documentation

## 2021-12-29 ENCOUNTER — Ambulatory Visit (INDEPENDENT_AMBULATORY_CARE_PROVIDER_SITE_OTHER): Payer: Commercial Managed Care - HMO | Admitting: Family

## 2021-12-29 DIAGNOSIS — I87331 Chronic venous hypertension (idiopathic) with ulcer and inflammation of right lower extremity: Secondary | ICD-10-CM

## 2021-12-29 DIAGNOSIS — S8012XA Contusion of left lower leg, initial encounter: Secondary | ICD-10-CM

## 2022-01-03 ENCOUNTER — Telehealth: Payer: Self-pay

## 2022-01-03 NOTE — Telephone Encounter (Signed)
Results reviewed with pt as per Dr. Krasowski's note.  Pt verbalized understanding and had no additional questions. Routed to PCP  

## 2022-01-05 ENCOUNTER — Ambulatory Visit (INDEPENDENT_AMBULATORY_CARE_PROVIDER_SITE_OTHER): Payer: Commercial Managed Care - HMO | Admitting: Family

## 2022-01-05 ENCOUNTER — Encounter: Payer: Self-pay | Admitting: Family

## 2022-01-05 ENCOUNTER — Encounter: Payer: Commercial Managed Care - HMO | Admitting: Family

## 2022-01-05 DIAGNOSIS — I87331 Chronic venous hypertension (idiopathic) with ulcer and inflammation of right lower extremity: Secondary | ICD-10-CM

## 2022-01-05 DIAGNOSIS — S8012XA Contusion of left lower leg, initial encounter: Secondary | ICD-10-CM

## 2022-01-05 NOTE — Progress Notes (Signed)
Post-Op Visit Note   Patient: Christina Dominguez           Date of Birth: 18-Dec-1965           MRN: [id number] Visit Date: 01/05/2022 PCP: Fredirick Lathe, PA-C  Chief Complaint:  Chief Complaint  Patient presents with   Left Leg - Routine Post Op    12/20/2021 LLE excision hematoma     HPI:  HPI The patient is a 56 year old woman who is seen in postoperative follow-up for excision of hematoma left lower leg.  Has been in a Dynaflex compression wrap for the last 1 week Ortho Exam On examination of the incision this is healing well proximally and distally centrally there is an area that has gaped open this is 2 cm in length this is open about 8 mm in width there is no probing no surrounding erythema or maceration filled in with granulation  Visit Diagnoses: No diagnosis found.  Plan: We will again apply a Dynaflex compression wrap today.  We will see her next week and reevaluate  Follow-Up Instructions: No follow-ups on file.   Imaging: No results found.  Orders:  No orders of the defined types were placed in this encounter.  No orders of the defined types were placed in this encounter.    PMFS History: Patient Active Problem List   Diagnosis Date Noted   Hematoma of left lower leg    Wheezing 11/23/2021   Snoring 11/23/2021   Other secondary pulmonary hypertension ([place]) 11/23/2021   Hyponatremia 10/11/2021   Dyspnea on exertion    Dyslipidemia 10/04/2021   Mediastinal mass 10/04/2021   Obstructive sleep apnea 08/25/2021   RUQ pain 08/19/2021   Hepatic steatosis 08/19/2021   Hepatomegaly 08/19/2021   Aortic atherosclerosis ([place]) 08/19/2021   Diverticulosis 08/19/2021   Anxiety 08/02/2021   Bipolar 1 disorder ([place]) 08/02/2021   CHF (congestive heart failure) ([place]) 08/02/2021   Depression 08/02/2021   Joint pain 08/02/2021   Screening for malignant neoplasm of colon 08/02/2021   Prediabetes 12/22/2019   Vitamin D insufficiency 12/22/2019   Class 3 severe  obesity with serious comorbidity and body mass index (BMI) greater than or equal to 70 in adult ([place]) 12/21/2019   Essential hypertension 04/30/2019   Mild intermittent asthma without complication 53/97/6734   Past Medical History:  Diagnosis Date   Anxiety    Aortic atherosclerosis ([place]) 08/19/2021   Arthritis    Asthma    Bipolar 1 disorder (HCC)    CHF (congestive heart failure) (HCC)    Class 3 severe obesity with serious comorbidity and body mass index (BMI) greater than or equal to 70 in adult ([place]) 12/21/2019   Depression    Diastolic dysfunction 19/37/9024   Diverticulosis 08/19/2021   Edema of both lower extremities    Hepatic steatosis 08/19/2021   Hepatomegaly 08/19/2021   High blood pressure    Joint pain    Pre-diabetes    Prediabetes 12/22/2019    Family History  Problem Relation Age of Onset   Heart Problems Mother        Broken heart syndrome   Hypertension Mother    Hypercholesterolemia Mother    Bone cancer Father    Testicular cancer Father    Heart attack Sister    Diabetes Brother    Colon cancer Maternal Grandfather    Dementia Paternal Grandmother     Past Surgical History:  Procedure Laterality Date   APPLICATION OF WOUND VAC Left 12/20/2021  Procedure: APPLICATION OF WOUND VAC;  Surgeon: [name], MD;  Location: [place];  Service: Orthopedics;  Laterality: Left;   I & D EXTREMITY Left 12/20/2021   Procedure: EXCISIONAL DEBRIDEMENT HEMATOMA LEFT LEG;  Surgeon: [name], MD;  Location: [place];  Service: Orthopedics;  Laterality: Left;   NO PAST SURGERIES     RIGHT/LEFT HEART CATH AND CORONARY ANGIOGRAPHY N/A 10/06/2021   Procedure: RIGHT/LEFT HEART CATH AND CORONARY ANGIOGRAPHY;  Surgeon: Jettie Booze, MD;  Location: [place] [facility];  Service: Cardiovascular;  Laterality: N/A;   Social History   Occupational History   Occupation: Medical illustrator, work from home  Tobacco Use   Smoking status: Never    Passive exposure: Never    Smokeless tobacco: Never  Vaping Use   Vaping Use: Never used  Substance and Sexual Activity   Alcohol use: Not Currently    Comment: Maybe 2 glasses of wine a month   Drug use: Never   Sexual activity: Not on file

## 2022-01-08 DIAGNOSIS — R0683 Snoring: Secondary | ICD-10-CM

## 2022-01-08 NOTE — Procedures (Signed)
     Patient Name: Christina Dominguez, Christina Dominguez Date: 12/28/2021 Gender: Female D.O.B: [birth year] Age (years): 67 Referring Provider: Chi Rodman Pickle Height (inches): 28 Interpreting Physician: Chesley Mires MD, ABSM Weight (lbs): 376 RPSGT: Jorge Ny BMI: 33 MRN: [id number] Neck Size: 16.00  CLINICAL INFORMATION Sleep Study Type: NPSG  Indication for sleep study: Congestive Heart Failure, Hypertension, Obesity, OSA, Snoring  Epworth Sleepiness Score: 14  SLEEP STUDY TECHNIQUE As per the AASM Manual for the Scoring of Sleep and Associated Events v2.3 (April 2016) with a hypopnea requiring 4% desaturations.  The channels recorded and monitored were frontal, central and occipital EEG, electrooculogram (EOG), submentalis EMG (chin), nasal and oral airflow, thoracic and abdominal wall motion, anterior tibialis EMG, snore microphone, electrocardiogram, and pulse oximetry.  MEDICATIONS Medications self-administered by patient taken the night of the study : N/A  SLEEP ARCHITECTURE The study was initiated at 11:03:41 PM and ended at 5:00:01 AM.  Sleep onset time was 7.9 minutes and the sleep efficiency was 72.3%%. The total sleep time was 257.5 minutes.  Stage REM latency was 243.5 minutes.  The patient spent 6.0%% of the night in stage N1 sleep, 91.5%% in stage N2 sleep, 0.0%% in stage N3 and 2.5% in REM.  Alpha intrusion was absent.  Supine sleep was 100.00%.  RESPIRATORY PARAMETERS The overall apnea/hypopnea index (AHI) was 7.7 per hour. There were 1 total apneas, including 1 obstructive, 0 central and 0 mixed apneas. There were 32 hypopneas and 23 RERAs.  The AHI during Stage REM sleep was 101.5 per hour.  AHI while supine was 7.7 per hour.  The mean oxygen saturation was 91.4%. The minimum SpO2 during sleep was 75.0%.  supplemental oxygen was not applied during this study.  Moderate snoring was noted during this study. she did not meet split-night study  criteria due to insufficient number of events in the first portion of the study.  CARDIAC DATA The 2 lead EKG demonstrated sinus rhythm. The mean heart rate was 79.5 beats per minute. Other EKG findings include: None.  LEG MOVEMENT DATA The total PLMS were 0 with a resulting PLMS index of 0.0. Associated arousal with leg movement index was 19.8 .  IMPRESSIONS - Mild obstructive sleep apnea with an AHI of 7.7 and SpO2 low of 75%.  She had a significant REM effect with an REM AHI of 101.5. - Increase in periodic limb movement index.  DIAGNOSIS - Obstructive Sleep Apnea - Periodic Limb Movements of Sleep  RECOMMENDATIONS - Additional therapies for her sleep apnea include CPAP, oral appliance, or surgical assessment. - She should have further assessment for the presence of restless leg syndrome. - Avoid alcohol, sedatives and other CNS depressants that may worsen sleep apnea and disrupt normal sleep architecture. - Sleep hygiene should be reviewed to assess factors that may improve sleep quality. - Weight management and regular exercise should be initiated or continued if appropriate.  [Electronically signed] 01/08/2022 02:39 PM  Chesley Mires MD, ABSM Diplomate, American Board of Sleep Medicine NPI: [id number]  [place] PH: [phone]   FX: [phone] [place]

## 2022-01-08 NOTE — Progress Notes (Signed)
IMPRESSIONS - Mild obstructive sleep apnea with an AHI of 7.7 and SpO2 low of 75%.  She had a significant REM effect with an REM AHI of 101.5. - Increase in periodic limb movement index.   DIAGNOSIS - Obstructive Sleep Apnea - Periodic Limb Movements of Sleep

## 2022-01-09 ENCOUNTER — Encounter: Payer: Managed Care, Other (non HMO) | Admitting: Radiology

## 2022-01-09 DIAGNOSIS — Z0289 Encounter for other administrative examinations: Secondary | ICD-10-CM

## 2022-01-12 ENCOUNTER — Ambulatory Visit (INDEPENDENT_AMBULATORY_CARE_PROVIDER_SITE_OTHER): Payer: Commercial Managed Care - HMO | Admitting: Family

## 2022-01-12 ENCOUNTER — Encounter: Payer: Self-pay | Admitting: Family

## 2022-01-12 DIAGNOSIS — I87331 Chronic venous hypertension (idiopathic) with ulcer and inflammation of right lower extremity: Secondary | ICD-10-CM

## 2022-01-12 DIAGNOSIS — S8012XA Contusion of left lower leg, initial encounter: Secondary | ICD-10-CM

## 2022-01-12 NOTE — Progress Notes (Addendum)
Post-Op Visit Note   Patient: Christina Dominguez           Date of Birth: 09/09/1965           MRN: [id number] Visit Date: 01/12/2022 PCP: Fredirick Lathe, PA-C  Chief Complaint:  Chief Complaint  Patient presents with   Left Leg - Routine Post Op    12/20/2021 excisional debridement hematoma     HPI:  HPI The patient is a 56 year old woman who is seen status post excision hematoma left lower leg she has been having serial Dynaflex compression wraps.  Unfortunately there has been some gaping of this incision  We are currently working on getting lymphedema pumps for her bilateral lower extremity Ortho Exam On examination of the left lower extremity the central area of her incision did gape open about 2 cm in width this is filled in with the 100% fibrinous exudative tissue today there is no surrounding erythema there is slight maceration there is no probing no purulence   Calf circumference 50 ankle circumference 24  Visit Diagnoses:  1. Hematoma of left lower leg   2. Chronic venous hypertension (idiopathic) with ulcer and inflammation of right lower extremity (HCC)     Plan: Begin daily dose of cleansing.  Dry dressing changes.  She may shower we will follow-up with her in 1 week.  Consider medical compression stockings at that time discussed possibility of future skin grafting.  Follow-Up Instructions: No follow-ups on file.   Imaging: No results found.  Orders:  No orders of the defined types were placed in this encounter.  No orders of the defined types were placed in this encounter.    PMFS History: Patient Active Problem List   Diagnosis Date Noted   Hematoma of left lower leg    Wheezing 11/23/2021   Snoring 11/23/2021   Other secondary pulmonary hypertension ([place]) 11/23/2021   Hyponatremia 10/11/2021   Dyspnea on exertion    Dyslipidemia 10/04/2021   Mediastinal mass 10/04/2021   Obstructive sleep apnea 08/25/2021   RUQ pain 08/19/2021   Hepatic  steatosis 08/19/2021   Hepatomegaly 08/19/2021   Aortic atherosclerosis ([place]) 08/19/2021   Diverticulosis 08/19/2021   Anxiety 08/02/2021   Bipolar 1 disorder ([place]) 08/02/2021   CHF (congestive heart failure) ([place]) 08/02/2021   Depression 08/02/2021   Joint pain 08/02/2021   Screening for malignant neoplasm of colon 08/02/2021   Prediabetes 12/22/2019   Vitamin D insufficiency 12/22/2019   Class 3 severe obesity with serious comorbidity and body mass index (BMI) greater than or equal to 70 in adult ([place]) 12/21/2019   Essential hypertension 04/30/2019   Mild intermittent asthma without complication 29/51/8841   Past Medical History:  Diagnosis Date   Anxiety    Aortic atherosclerosis ([place]) 08/19/2021   Arthritis    Asthma    Bipolar 1 disorder (HCC)    CHF (congestive heart failure) (HCC)    Class 3 severe obesity with serious comorbidity and body mass index (BMI) greater than or equal to 70 in adult ([place]) 12/21/2019   Depression    Diastolic dysfunction 66/01/3015   Diverticulosis 08/19/2021   Edema of both lower extremities    Hepatic steatosis 08/19/2021   Hepatomegaly 08/19/2021   High blood pressure    Joint pain    Pre-diabetes    Prediabetes 12/22/2019    Family History  Problem Relation Age of Onset   Heart Problems Mother        Broken heart syndrome  Hypertension Mother    Hypercholesterolemia Mother    Bone cancer Father    Testicular cancer Father    Heart attack Sister    Diabetes Brother    Colon cancer Maternal Grandfather    Dementia Paternal Grandmother     Past Surgical History:  Procedure Laterality Date   APPLICATION OF WOUND VAC Left 12/20/2021   Procedure: APPLICATION OF WOUND VAC;  Surgeon: [name], MD;  Location: [place];  Service: Orthopedics;  Laterality: Left;   I & D EXTREMITY Left 12/20/2021   Procedure: EXCISIONAL DEBRIDEMENT HEMATOMA LEFT LEG;  Surgeon: [name], MD;  Location: [place];  Service: Orthopedics;  Laterality:  Left;   NO PAST SURGERIES     RIGHT/LEFT HEART CATH AND CORONARY ANGIOGRAPHY N/A 10/06/2021   Procedure: RIGHT/LEFT HEART CATH AND CORONARY ANGIOGRAPHY;  Surgeon: Jettie Booze, MD;  Location: [place] [facility];  Service: Cardiovascular;  Laterality: N/A;   Social History   Occupational History   Occupation: Medical illustrator, work from home  Tobacco Use   Smoking status: Never    Passive exposure: Never   Smokeless tobacco: Never  Vaping Use   Vaping Use: Never used  Substance and Sexual Activity   Alcohol use: Not Currently    Comment: Maybe 2 glasses of wine a month   Drug use: Never   Sexual activity: Not on file

## 2022-01-18 ENCOUNTER — Encounter: Payer: Self-pay | Admitting: Orthopedic Surgery

## 2022-01-18 ENCOUNTER — Ambulatory Visit (INDEPENDENT_AMBULATORY_CARE_PROVIDER_SITE_OTHER): Payer: Commercial Managed Care - HMO | Admitting: Orthopedic Surgery

## 2022-01-18 DIAGNOSIS — I890 Lymphedema, not elsewhere classified: Secondary | ICD-10-CM

## 2022-01-18 DIAGNOSIS — S8012XA Contusion of left lower leg, initial encounter: Secondary | ICD-10-CM

## 2022-01-18 DIAGNOSIS — I87331 Chronic venous hypertension (idiopathic) with ulcer and inflammation of right lower extremity: Secondary | ICD-10-CM

## 2022-01-18 NOTE — Progress Notes (Signed)
Office Visit Note   Patient: Christina Dominguez           Date of Birth: 1965-08-10           MRN: [id number] Visit Date: 01/18/2022              Requested by: Allwardt, Randa Evens, PA-C [place],  [place] [id number] PCP: Fredirick Lathe, PA-C  Chief Complaint  Patient presents with   Left Leg - Follow-up      HPI: Patient is a 56 year old woman who presents 4 weeks status post excision of a large hematoma left leg and application of tissue graft.  Patient states that she is doing well ambulates with a rolling walker.  Patient has significant venous and lymphatic insufficiency and will need long-term lymphedema pumps.  Assessment & Plan: Visit Diagnoses:  1. Lymphedema   2. Chronic venous hypertension (idiopathic) with ulcer and inflammation of right lower extremity (HCC)   3. Hematoma of left lower leg     Plan: We will set patient up for evaluation for a Flexitouch pump.  Patient has tried and failed several months of compression exercise and elevation.  Patient does have a unique issue of thin atrophic skin and a healing wound on the left leg from her venous insufficiency and lymphatic insufficiency.  Follow-Up Instructions: Return in about 2 weeks (around 02/01/2022).   Ortho Exam  Patient is alert, oriented, no adenopathy, well-dressed, normal affect, normal respiratory effort. Examination the wound has fibrinous exudative tissue it measures 20 x 65 x 5 mm.  The fibrinous tissue was debrided recommended Dial soap cleansing dry dressing changes with an Ace follow-up in 2 weeks.  Imaging: No results found. No images are attached to the encounter.  Labs: Lab Results  Component Value Date   HGBA1C 5.9 (H) 08/20/2021   HGBA1C 6.0 (H) 03/22/2020   HGBA1C 6.0 (H) 12/21/2019   CRP 1.6 (H) 10/09/2021   REPTSTATUS 12/25/2021 FINAL 12/20/2021   GRAMSTAIN NO WBC SEEN NO ORGANISMS SEEN  12/20/2021   CULT  12/20/2021    No growth aerobically or  anaerobically. Performed at [facility], Day [address], [place], [place] [address]      Lab Results  Component Value Date   ALBUMIN 3.8 10/03/2021   ALBUMIN 3.9 08/18/2021   ALBUMIN 4.2 06/28/2021    No results found for: "MG" Lab Results  Component Value Date   VD25OH 33.5 03/22/2020   VD25OH 28.6 (L) 12/21/2019    No results found for: "PREALBUMIN"    Latest Ref Rng & Units 12/20/2021    8:48 AM 10/10/2021   12:43 AM 10/06/2021    4:45 PM  CBC EXTENDED  WBC 4.0 - 10.5 K/uL 8.3  4.8    RBC 3.87 - 5.11 MIL/uL 4.02  4.12    Hemoglobin 12.0 - 15.0 g/dL 12.1  12.1  12.9    12.6   HCT 36.0 - 46.0 % 38.0  37.2  38.0    37.0   Platelets 150 - 400 K/uL 334  274       There is no height or weight on file to calculate BMI.  Orders:  No orders of the defined types were placed in this encounter.  No orders of the defined types were placed in this encounter.    Procedures: No procedures performed  Clinical Data: No additional findings.  ROS:  All other systems negative, except as noted in the HPI. Review of Systems  Objective: Vital Signs: There were no vitals taken for this visit.  Specialty Comments:  No specialty comments available.  PMFS History: Patient Active Problem List   Diagnosis Date Noted   Hematoma of left lower leg    Wheezing 11/23/2021   Snoring 11/23/2021   Other secondary pulmonary hypertension ([place]) 11/23/2021   Hyponatremia 10/11/2021   Dyspnea on exertion    Dyslipidemia 10/04/2021   Mediastinal mass 10/04/2021   Obstructive sleep apnea 08/25/2021   RUQ pain 08/19/2021   Hepatic steatosis 08/19/2021   Hepatomegaly 08/19/2021   Aortic atherosclerosis ([place]) 08/19/2021   Diverticulosis 08/19/2021   Anxiety 08/02/2021   Bipolar 1 disorder ([place]) 08/02/2021   CHF (congestive heart failure) ([place]) 08/02/2021   Depression 08/02/2021   Joint pain 08/02/2021   Screening for malignant neoplasm of colon 08/02/2021   Prediabetes  12/22/2019   Vitamin D insufficiency 12/22/2019   Class 3 severe obesity with serious comorbidity and body mass index (BMI) greater than or equal to 70 in adult ([place]) 12/21/2019   Essential hypertension 04/30/2019   Mild intermittent asthma without complication 26/20/3559   Past Medical History:  Diagnosis Date   Anxiety    Aortic atherosclerosis ([place]) 08/19/2021   Arthritis    Asthma    Bipolar 1 disorder (HCC)    CHF (congestive heart failure) (HCC)    Class 3 severe obesity with serious comorbidity and body mass index (BMI) greater than or equal to 70 in adult ([place]) 12/21/2019   Depression    Diastolic dysfunction 74/16/3845   Diverticulosis 08/19/2021   Edema of both lower extremities    Hepatic steatosis 08/19/2021   Hepatomegaly 08/19/2021   High blood pressure    Joint pain    Pre-diabetes    Prediabetes 12/22/2019    Family History  Problem Relation Age of Onset   Heart Problems Mother        Broken heart syndrome   Hypertension Mother    Hypercholesterolemia Mother    Bone cancer Father    Testicular cancer Father    Heart attack Sister    Diabetes Brother    Colon cancer Maternal Grandfather    Dementia Paternal Grandmother     Past Surgical History:  Procedure Laterality Date   APPLICATION OF WOUND VAC Left 12/20/2021   Procedure: APPLICATION OF WOUND VAC;  Surgeon: [name], MD;  Location: [facility];  Service: Orthopedics;  Laterality: Left;   I & D EXTREMITY Left 12/20/2021   Procedure: EXCISIONAL DEBRIDEMENT HEMATOMA LEFT LEG;  Surgeon: [name], MD;  Location: [facility];  Service: Orthopedics;  Laterality: Left;   NO PAST SURGERIES     RIGHT/LEFT HEART CATH AND CORONARY ANGIOGRAPHY N/A 10/06/2021   Procedure: RIGHT/LEFT HEART CATH AND CORONARY ANGIOGRAPHY;  Surgeon: Jettie Booze, MD;  Location: [facility];  Service: Cardiovascular;  Laterality: N/A;   Social History   Occupational History   Occupation: Medical illustrator, work from home   Tobacco Use   Smoking status: Never    Passive exposure: Never   Smokeless tobacco: Never  Vaping Use   Vaping Use: Never used  Substance and Sexual Activity   Alcohol use: Not Currently    Comment: Maybe 2 glasses of wine a month   Drug use: Never   Sexual activity: Not on file

## 2022-01-23 ENCOUNTER — Telehealth: Payer: Self-pay

## 2022-01-23 ENCOUNTER — Telehealth: Payer: Self-pay | Admitting: Cardiology

## 2022-01-23 NOTE — Telephone Encounter (Signed)
Follow Up:    Patient said she had her Sleep Study on 01-03-22 and still have not received her results.

## 2022-01-23 NOTE — Telephone Encounter (Signed)
Pt called wanting results of sleep study. Advised that we had not seen any results yet and would call if we get any information.

## 2022-01-24 ENCOUNTER — Ambulatory Visit (INDEPENDENT_AMBULATORY_CARE_PROVIDER_SITE_OTHER): Payer: Commercial Managed Care - HMO | Admitting: Family

## 2022-01-24 ENCOUNTER — Encounter: Payer: Self-pay | Admitting: Family

## 2022-01-24 DIAGNOSIS — S8012XA Contusion of left lower leg, initial encounter: Secondary | ICD-10-CM

## 2022-01-24 DIAGNOSIS — I87331 Chronic venous hypertension (idiopathic) with ulcer and inflammation of right lower extremity: Secondary | ICD-10-CM

## 2022-01-24 DIAGNOSIS — I890 Lymphedema, not elsewhere classified: Secondary | ICD-10-CM

## 2022-01-24 NOTE — Progress Notes (Signed)
Post-Op Visit Note   Patient: Christina Dominguez           Date of Birth: 1966/06/27           MRN: [id number] Visit Date: 01/24/2022 PCP: Fredirick Lathe, PA-C  Chief Complaint: No chief complaint on file.   HPI:  HPI The patient is a 56 year old woman who presents status post left leg excision and debridement of hematoma today she presents worried about some infection she states that she has seen some yellow tissue in her wound bed  She did have some dehiscence of her incision she has been in dry dressings with Ace wrap for the last 2 weeks Ortho Exam On examination of the left leg she does have significant edema above the layer of the compression of the Ace wrap this begins just at the most proximal aspect of her wound the wound/incision has opened up significantly this is 7 x 2.5 cm in size there is undermining dorsally.  There is no active drainage she does have a 50% fibrinous exudative tissue in the wound bed there is no active drainage no foul odor  Visit Diagnoses: No diagnosis found.  Plan: We will pack the wound with silver cell and apply Dynaflex compression wrap.  We are working on getting her set up with lymphedema pumps plan follow-up in 1 week in the office.  Follow-Up Instructions: Return in about 1 week (around 01/31/2022).   Imaging: No results found.  Orders:  No orders of the defined types were placed in this encounter.  No orders of the defined types were placed in this encounter.    PMFS History: Patient Active Problem List   Diagnosis Date Noted   Hematoma of left lower leg    Wheezing 11/23/2021   Snoring 11/23/2021   Other secondary pulmonary hypertension ([place]) 11/23/2021   Hyponatremia 10/11/2021   Dyspnea on exertion    Dyslipidemia 10/04/2021   Mediastinal mass 10/04/2021   Obstructive sleep apnea 08/25/2021   RUQ pain 08/19/2021   Hepatic steatosis 08/19/2021   Hepatomegaly 08/19/2021   Aortic atherosclerosis ([place]) 08/19/2021    Diverticulosis 08/19/2021   Anxiety 08/02/2021   Bipolar 1 disorder ([place]) 08/02/2021   CHF (congestive heart failure) ([place]) 08/02/2021   Depression 08/02/2021   Joint pain 08/02/2021   Screening for malignant neoplasm of colon 08/02/2021   Prediabetes 12/22/2019   Vitamin D insufficiency 12/22/2019   Class 3 severe obesity with serious comorbidity and body mass index (BMI) greater than or equal to 70 in adult ([place]) 12/21/2019   Essential hypertension 04/30/2019   Mild intermittent asthma without complication 32/35/5732   Past Medical History:  Diagnosis Date   Anxiety    Aortic atherosclerosis ([place]) 08/19/2021   Arthritis    Asthma    Bipolar 1 disorder (HCC)    CHF (congestive heart failure) (HCC)    Class 3 severe obesity with serious comorbidity and body mass index (BMI) greater than or equal to 70 in adult ([place]) 12/21/2019   Depression    Diastolic dysfunction 20/25/4270   Diverticulosis 08/19/2021   Edema of both lower extremities    Hepatic steatosis 08/19/2021   Hepatomegaly 08/19/2021   High blood pressure    Joint pain    Pre-diabetes    Prediabetes 12/22/2019    Family History  Problem Relation Age of Onset   Heart Problems Mother        Broken heart syndrome   Hypertension Mother    Hypercholesterolemia Mother  Bone cancer Father    Testicular cancer Father    Heart attack Sister    Diabetes Brother    Colon cancer Maternal Grandfather    Dementia Paternal Grandmother     Past Surgical History:  Procedure Laterality Date   APPLICATION OF WOUND VAC Left 12/20/2021   Procedure: APPLICATION OF WOUND VAC;  Surgeon: [name], MD;  Location: [facility];  Service: Orthopedics;  Laterality: Left;   I & D EXTREMITY Left 12/20/2021   Procedure: EXCISIONAL DEBRIDEMENT HEMATOMA LEFT LEG;  Surgeon: [name], MD;  Location: [facility];  Service: Orthopedics;  Laterality: Left;   NO PAST SURGERIES     RIGHT/LEFT HEART CATH AND CORONARY ANGIOGRAPHY N/A 10/06/2021    Procedure: RIGHT/LEFT HEART CATH AND CORONARY ANGIOGRAPHY;  Surgeon: Jettie Booze, MD;  Location: [facility];  Service: Cardiovascular;  Laterality: N/A;   Social History   Occupational History   Occupation: Medical illustrator, work from home  Tobacco Use   Smoking status: Never    Passive exposure: Never   Smokeless tobacco: Never  Vaping Use   Vaping Use: Never used  Substance and Sexual Activity   Alcohol use: Not Currently    Comment: Maybe 2 glasses of wine a month   Drug use: Never   Sexual activity: Not on file

## 2022-01-26 ENCOUNTER — Other Ambulatory Visit: Payer: Self-pay | Admitting: Orthopedic Surgery

## 2022-01-26 ENCOUNTER — Ambulatory Visit: Payer: Managed Care, Other (non HMO) | Admitting: Physician Assistant

## 2022-01-26 ENCOUNTER — Telehealth: Payer: Self-pay

## 2022-01-26 MED ORDER — HYDROCODONE-ACETAMINOPHEN 5-325 MG PO TABS: 1.0000 | ORAL_TABLET | ORAL | 0 refills | Status: DC | PRN

## 2022-01-30 ENCOUNTER — Ambulatory Visit: Payer: Managed Care, Other (non HMO) | Admitting: Pulmonary Disease

## 2022-01-30 ENCOUNTER — Encounter: Payer: Self-pay | Admitting: Pulmonary Disease

## 2022-01-30 ENCOUNTER — Ambulatory Visit (INDEPENDENT_AMBULATORY_CARE_PROVIDER_SITE_OTHER): Payer: Commercial Managed Care - HMO | Admitting: Pulmonary Disease

## 2022-01-30 VITALS — BP 116/64 | HR 87 | Ht 60.0 in | Wt 388.0 lb

## 2022-01-30 DIAGNOSIS — R062 Wheezing: Secondary | ICD-10-CM

## 2022-01-30 DIAGNOSIS — M7989 Other specified soft tissue disorders: Secondary | ICD-10-CM

## 2022-01-30 MED ORDER — ALBUTEROL SULFATE HFA 108 (90 BASE) MCG/ACT IN AERS: 2.0000 | INHALATION_SPRAY | Freq: Four times a day (QID) | RESPIRATORY_TRACT | 6 refills | Status: AC | PRN

## 2022-01-31 ENCOUNTER — Encounter: Payer: Self-pay | Admitting: Family

## 2022-01-31 ENCOUNTER — Emergency Department (HOSPITAL_BASED_OUTPATIENT_CLINIC_OR_DEPARTMENT_OTHER)
Admission: EM | Admit: 2022-01-31 | Discharge: 2022-01-31 | Disposition: A | Discharge status: Home / Self Care | Payer: Commercial Managed Care - HMO | Attending: Emergency Medicine | Admitting: Emergency Medicine

## 2022-01-31 ENCOUNTER — Emergency Department (HOSPITAL_BASED_OUTPATIENT_CLINIC_OR_DEPARTMENT_OTHER): Payer: Commercial Managed Care - HMO

## 2022-01-31 ENCOUNTER — Ambulatory Visit (INDEPENDENT_AMBULATORY_CARE_PROVIDER_SITE_OTHER): Payer: Commercial Managed Care - HMO | Admitting: Family

## 2022-01-31 ENCOUNTER — Ambulatory Visit (HOSPITAL_BASED_OUTPATIENT_CLINIC_OR_DEPARTMENT_OTHER)
Admission: RE | Admit: 2022-01-31 | Discharge: 2022-01-31 | Disposition: A | Discharge status: Home / Self Care | Payer: Commercial Managed Care - HMO | Source: Ambulatory Visit | Attending: Pulmonary Disease | Admitting: Pulmonary Disease

## 2022-01-31 ENCOUNTER — Encounter (HOSPITAL_BASED_OUTPATIENT_CLINIC_OR_DEPARTMENT_OTHER): Payer: Self-pay | Admitting: Emergency Medicine

## 2022-01-31 ENCOUNTER — Other Ambulatory Visit: Payer: Self-pay

## 2022-01-31 ENCOUNTER — Telehealth: Payer: Self-pay | Admitting: Cardiology

## 2022-01-31 DIAGNOSIS — I509 Heart failure, unspecified: Secondary | ICD-10-CM | POA: Insufficient documentation

## 2022-01-31 DIAGNOSIS — R5383 Other fatigue: Secondary | ICD-10-CM | POA: Diagnosis not present

## 2022-01-31 DIAGNOSIS — I110 Hypertensive heart disease with heart failure: Secondary | ICD-10-CM | POA: Insufficient documentation

## 2022-01-31 DIAGNOSIS — R0602 Shortness of breath: Secondary | ICD-10-CM | POA: Insufficient documentation

## 2022-01-31 DIAGNOSIS — M7989 Other specified soft tissue disorders: Secondary | ICD-10-CM

## 2022-01-31 DIAGNOSIS — I890 Lymphedema, not elsewhere classified: Secondary | ICD-10-CM

## 2022-01-31 DIAGNOSIS — S8012XA Contusion of left lower leg, initial encounter: Secondary | ICD-10-CM

## 2022-01-31 LAB — BASIC METABOLIC PANEL WITH GFR
Anion gap: 6 (ref 5–15)
BUN: 20 mg/dL (ref 6–20)
CO2: 29 mmol/L (ref 22–32)
Calcium: 8.8 mg/dL — ABNORMAL LOW (ref 8.9–10.3)
Chloride: 102 mmol/L (ref 98–111)
Creatinine, Ser: 0.83 mg/dL (ref 0.44–1.00)
GFR, Estimated: >60 mL/min (ref >60)
Glucose, Bld: 96 mg/dL (ref 70–99)
Potassium: 3.8 mmol/L (ref 3.5–5.1)
Sodium: 137 mmol/L (ref 135–145)

## 2022-01-31 LAB — CBC
HCT: 36.6 % (ref 36.0–46.0)
Hemoglobin: 11.4 g/dL — ABNORMAL LOW (ref 12.0–15.0)
MCH: 29.8 pg (ref 26.0–34.0)
MCHC: 31.1 g/dL (ref 30.0–36.0)
MCV: 95.6 fL (ref 80.0–100.0)
Platelets: 323 10*3/uL (ref 150–400)
RBC: 3.83 MIL/uL — ABNORMAL LOW (ref 3.87–5.11)
RDW: 14.7 % (ref 11.5–15.5)
WBC: 9.1 10*3/uL (ref 4.0–10.5)
nRBC: 0 % (ref 0.0–0.2)

## 2022-01-31 LAB — TROPONIN I (HIGH SENSITIVITY): Troponin I (High Sensitivity): 3 ng/L (ref <18)

## 2022-01-31 LAB — BRAIN NATRIURETIC PEPTIDE: B Natriuretic Peptide: 42.2 pg/mL (ref 0.0–100.0)

## 2022-01-31 NOTE — ED Provider Notes (Signed)
[facility] Provider Note   CSN: [id number] Arrival date & time: 01/31/22  1410     History  Chief Complaint  Patient presents with   Shortness of Breath   Leg Swelling    Christina Dominguez is a 56 y.o. female with history of chronic CHF, hypertension, lymphedema who presents the ED for evaluation of increased shortness of breath and unilateral left-sided leg swelling.  Patient had a hematoma excised from the left lower extremity on 12/20/2021 with Dr. Sharol Given.  She has been following with them as she is having poor healing on that surgical wound.  Patient saw orthopedics today for follow-up appointment and the wound was noted to have dehiscence with fibrinous tissue filling in the surgical wound.  They applied dry dressing and they are planning to reapply an Unna or Dynaflex compression garment this week.  Patient also saw her pulmonologist yesterday where she had mentioned her increasing shortness of breath, fatigue and the unilateral leg swelling and had ultrasound ordered.  This was done this morning was negative for DVT.  Patient takes Lasix and potassium daily but states that she has not taken in a few doses of her Lasix this week.  She denies chest pain, abdominal pain, nausea, vomiting and diarrhea.  She did have 5 days of diarrhea last week that has since resolved.   Shortness of Breath      Home Medications Prior to Admission medications   Medication Sig Start Date End Date Taking? Authorizing Provider  acetaminophen (TYLENOL) 650 MG CR tablet Take 1,300 mg by mouth every 8 (eight) hours as needed for pain.    [provider]  albuterol (VENTOLIN HFA) 108 (90 Base) MCG/ACT inhaler Inhale 2 puffs into the lungs every 6 (six) hours as needed for wheezing or shortness of breath. 01/30/22   Margaretha Seeds, MD  atorvastatin (LIPITOR) 40 MG tablet Take 1 tablet (40 mg total) by mouth daily. 08/22/21   Little Ishikawa, MD  CATS CLAW, UNCARIA  TOMENTOSA, PO Take 2 capsules by mouth daily.    [provider]  Cholecalciferol (VITAMIN D) 50 MCG (2000 UT) tablet Take 2,000 Units by mouth daily.    [provider]  diclofenac Sodium (VOLTAREN) 1 % GEL Apply 2 g topically daily as needed (pain).    [provider]  diphenhydrAMINE (BENADRYL) 25 MG tablet Take 50 mg by mouth at bedtime as needed for sleep.    [provider]  furosemide (LASIX) 80 MG tablet TAKE 1 AND 1/2 TABLET BY MOUTH EVERY DAY FOR 4 DAYS WITH '40MG'$  POTASSIUM, THEN RESUME 1 TABLET DAILY AND '20MG'$  POTASSIUM Patient taking differently: Take 80 mg by mouth daily. 12/11/21   Park Liter, MD  HYDROcodone-acetaminophen (NORCO/VICODIN) 5-325 MG tablet Take 1 tablet by mouth every 4 (four) hours as needed. 01/26/22   [name], MD  OVER THE COUNTER MEDICATION Take 3 tablets by mouth daily. Mobility essentials supplement    [provider]  potassium chloride SA (KLOR-CON M) 20 MEQ tablet Take 1 tablet (20 mEq total) by mouth daily. 12/18/21   Park Liter, MD  TURMERIC PO Take 1 tablet by mouth daily.    [provider]  valsartan (DIOVAN) 40 MG tablet Take 1 tablet (40 mg total) by mouth daily. 08/09/21   Richardo Priest, MD      Allergies    Aspirin, Eggs or egg-derived products, and Influenza vaccines    Review of Systems  Review of Systems  Respiratory:  Positive for shortness of breath.     Physical Exam Updated Vital Signs BP (!) 147/81   Pulse 84   Temp 97.7 F (36.5 C) (Oral)   Resp 18   SpO2 93%  Physical Exam Vitals and nursing note reviewed.  Constitutional:      General: She is not in acute distress.    Appearance: She is obese. She is not ill-appearing.  HENT:     Head: Atraumatic.  Eyes:     Conjunctiva/sclera: Conjunctivae normal.  Cardiovascular:     Rate and Rhythm: Normal rate and regular rhythm.     Pulses: Normal pulses.     Heart sounds: No murmur heard. Pulmonary:      Effort: Pulmonary effort is normal. No respiratory distress.     Breath sounds: Normal breath sounds.     Comments: Patient speaking in full complete sentences, no wheezes rales or rhonchi Abdominal:     General: Abdomen is flat. There is no distension.     Palpations: Abdomen is soft.     Tenderness: There is no abdominal tenderness.  Musculoskeletal:        General: Normal range of motion.     Cervical back: Normal range of motion.  Skin:    General: Skin is warm and dry.     Capillary Refill: Capillary refill takes less than 2 seconds.     Comments: Poor healing surgical wound of the left lower extremity with dry dressing applied  Neurological:     General: No focal deficit present.     Mental Status: She is alert.  Psychiatric:        Mood and Affect: Mood normal.     ED Results / Procedures / Treatments   Labs (all labs ordered are listed, but only abnormal results are displayed) Labs Reviewed  BASIC METABOLIC PANEL - Abnormal; Notable for the following components:      Result Value   Calcium 8.8 (*)    All other components within normal limits  CBC - Abnormal; Notable for the following components:   RBC 3.83 (*)    Hemoglobin 11.4 (*)    All other components within normal limits  BRAIN NATRIURETIC PEPTIDE  TROPONIN I (HIGH SENSITIVITY)  TROPONIN I (HIGH SENSITIVITY)    EKG EKG Interpretation  Date/Time:  Wednesday January 31 2022 14:37:20 EDT Ventricular Rate:  74 PR Interval:  156 QRS Duration: 80 QT Interval:  396 QTC Calculation: 439 R Axis:   47 Text Interpretation: Normal sinus rhythm Low voltage QRS Borderline ECG When compared with ECG of 03-Oct-2021 20:06, PREVIOUS ECG IS PRESENT Confirmed by Wandra Arthurs [phone]) on 01/31/2022 4:29:35 PM  Radiology DG Chest Portable 1 View  Result Date: 01/31/2022 CLINICAL DATA:  56 year old female with shortness of breath. EXAM: PORTABLE CHEST - 1 VIEW COMPARISON:  10/09/2021 FINDINGS: The radiograph is under exposed.  The mediastinal contours are within normal limits. Unchanged cardiomegaly. Hazy opacities about the lung base, similar to comparison. no acute osseous abnormality. IMPRESSION: 1. Examination limited by technique and underexposure. Similar appearing bibasilar hazy opacities which could be technical, or represent atelectasis or mild edema. 2. Unchanged cardiomegaly. Electronically Signed   By: Ruthann Cancer M.D.   On: 01/31/2022 15:41   US Venous Img Lower Unilateral Left (DVT)  Result Date: 01/31/2022 CLINICAL DATA:  Pain and swelling EXAM: Left LOWER EXTREMITY VENOUS DOPPLER ULTRASOUND TECHNIQUE: Gray-scale sonography with compression, as well as color and duplex ultrasound, were  performed to evaluate the deep venous system(s) from the level of the common femoral vein through the popliteal and proximal calf veins. COMPARISON:  None Available. FINDINGS: VENOUS Normal compressibility of the common femoral, superficial femoral, and popliteal veins, as well as the visualized calf veins. Visualized portions of profunda femoral vein and great saphenous vein unremarkable. No filling defects to suggest DVT on grayscale or color Doppler imaging. Doppler waveforms show normal direction of venous flow, normal respiratory plasticity and response to augmentation. Limited views of the contralateral common femoral vein are unremarkable. OTHER Peroneal veins in the calf are not optimally visualized for evaluation. Limitations: Examination was technically difficult due to patient's body habitus. IMPRESSION: There is no evidence of acute DVT in the major deep veins in the left lower extremity. Peroneal veins in the left calf are not adequately visualized for evaluation. Examination was technically difficult due to patient's body habitus. If symptoms persist, short-term follow-up examination as clinically warranted should be considered. Electronically Signed   By: Elmer Picker M.D.   On: 01/31/2022 13:41     Procedures Procedures    Medications Ordered in ED Medications - No data to display  ED Course/ Medical Decision Making/ A&P                           Medical Decision Making Amount and/or Complexity of Data Reviewed Labs: ordered. Radiology: ordered.   Social determinants of health:  Social History   Socioeconomic History   Marital status: Single    Spouse name: Not on file   Number of children: Not on file   Years of education: Not on file   Highest education level: Not on file  Occupational History   Occupation: Medical illustrator, work from home  Tobacco Use   Smoking status: Never    Passive exposure: Never   Smokeless tobacco: Never  Vaping Use   Vaping Use: Never used  Substance and Sexual Activity   Alcohol use: Not Currently    Comment: Maybe 2 glasses of wine a month   Drug use: Never   Sexual activity: Not on file  Other Topics Concern   Not on file  Social History Narrative   Not on file   Social Determinants of Health   Financial Resource Strain: Medium Risk (10/05/2021)   Overall Financial Resource Strain (CARDIA)    Difficulty of Paying Living Expenses: Somewhat hard  Food Insecurity: No Food Insecurity (10/05/2021)   Hunger Vital Sign    Worried About Running Out of Food in the Last Year: Never true    Ran Out of Food in the Last Year: Never true  Transportation Needs: No Transportation Needs (10/05/2021)   PRAPARE - Hydrologist (Medical): No    Lack of Transportation (Non-Medical): No  Physical Activity: Not on file  Stress: Not on file  Social Connections: Not on file  Intimate Partner Violence: Not on file     Initial impression:  This patient presents to the ED for concern of left leg swelling and shortness of breath, this involves an extensive number of treatment options, and is a complaint that carries with it a high risk of complications and morbidity.   Differentials include PE, DVT, CHF exacerbation,.    Comorbidities affecting care:  Morbid obesity, CHF  Additional history obtained: Chart review, details in HPI  Lab Tests  I Ordered, reviewed, and interpreted labs and EKG.  The pertinent results include:  BMP, CBC unremarkable BNP normal, troponin normal  Imaging Studies ordered:  I ordered imaging studies including  Chest x-ray with stable cardiomegaly, possible mild edema although technique reported to be poor per radiologist interpretation. I independently visualized and interpreted imaging and I agree with the radiologist interpretation.   EKG: Normal sinus rhythm with low voltage QRS  Cardiac Monitoring:  The patient was maintained on a cardiac monitor.  I personally viewed and interpreted the cardiac monitored which showed an underlying rhythm of: Normal sinus rhythm  ED Course/Re-evaluation: 56 year old female presents emergency department for evaluation of increasing shortness of breath, fatigue and left-sided leg swelling.  Vitals are without significant abnormality.  Patient is nontachycardic and is satting well on room air at 98%.  She is speaking in full complete sentences without wheezes.  Physical exam overall benign although she does have a poorly healed surgical wound on the left lower extremity that is currently being followed with orthopedics.  DVT ultrasound today was negative. Labs are overall reassuring without any acute findings.  Patient does not seem to be in acute CHF exacerbation.  Troponin was also normal.  Disposition:  After consideration of the diagnostic results, physical exam, history and the patients response to treatment feel that the patent would benefit from discharge with strict return precautions.   Shortness of breath Leg swelling: Plan and management as described above. Discharged home in good condition.  Final Clinical Impression(s) / ED Diagnoses Final diagnoses:  Shortness of breath  Leg swelling    Rx / DC Orders ED Discharge  Orders     None         Tonye [name], PA-C 01/31/22 1707    Drenda Freeze, MD 01/31/22 1714

## 2022-01-31 NOTE — Telephone Encounter (Signed)
Pt c/o swelling: STAT is pt has developed SOB within 24 hours  If swelling, where is the swelling located? Left leg  How much weight have you gained and in what time span? 11 lbs 24 hrs   Have you gained 3 pounds in a day or 5 pounds in a week? yes  Do you have a log of your daily weights (if so, list)?  [phone]   Are you currently taking a fluid pill? yes  Are you currently SOB? yes  Have you traveled recently? No  Pt states that she has gained 11 lbs in 24 hours. She states she is feeling fatigue, SOB and having a hard time collecting her thoughts. Call sent to triage.

## 2022-01-31 NOTE — Discharge Instructions (Signed)
Your work-up today was overall very reassuring.  There does not appear to be any evidence that you are in acute exacerbation of your CHF, however there may possibly be some evidence of edema on your chest x-ray.  You are currently taking 80 mg Lasix-please increase this by 1/2 tablet for an additional 40 mg (120 mg total) for 4 days.  Follow-up with your pulmonologist and your PCP.  Return if symptoms worsen.

## 2022-01-31 NOTE — ED Triage Notes (Signed)
Pt reports shob, fatigue, and left leg swelling that worsened over the past 24 hours. Reports gaining 11 lbs in 24 hours. Noncompliant with lasix.

## 2022-01-31 NOTE — ED Notes (Signed)
D/c paperwork reviewed with pt, no questions or concerns at time of d/c. Pt assisted to wheelchair and wheeled to ED exit by NT.

## 2022-01-31 NOTE — Progress Notes (Signed)
Post-Op Visit Note   Patient: Christina Dominguez           Date of Birth: 1965-12-31           MRN: [id number] Visit Date: 01/31/2022 PCP: Fredirick Lathe, PA-C  Chief Complaint: No chief complaint on file.   HPI:  HPI The patient is a 56 year old woman who presents status post excisional debridement hematoma left lower extremity on May 17.  Did not have skin grafting at that time.  She currently has been in a Profore dressing for the last 1 week.  She did see her pulmonologist yesterday at that time she had a complaint of worsening of her edema to her left lower leg.  She is scheduled for ultrasound rule out DVT later today.  Ortho Exam On examination of the left lower extremity she has had dehiscence of her incision please see attached images.  This is filled in with nearly 100% fibrinous exudative tissue it is improved from last viewing there is no longer any undermining.  There is scant active serous drainage there is no surrounding maceration the wound is 4 mm deep  Calf 57 cm ankle 25 cm  Visit Diagnoses: No diagnosis found.  Plan: Dry dressing applied today as she will be heading over for her ultrasound of the left lower extremity we will plan to reapply a Unna and Dynaflex compression garment later this week  Follow-Up Instructions: No follow-ups on file.   Imaging: US Venous Img Lower Unilateral Left (DVT)  Result Date: 01/31/2022 CLINICAL DATA:  Pain and swelling EXAM: Left LOWER EXTREMITY VENOUS DOPPLER ULTRASOUND TECHNIQUE: Gray-scale sonography with compression, as well as color and duplex ultrasound, were performed to evaluate the deep venous system(s) from the level of the common femoral vein through the popliteal and proximal calf veins. COMPARISON:  None Available. FINDINGS: VENOUS Normal compressibility of the common femoral, superficial femoral, and popliteal veins, as well as the visualized calf veins. Visualized portions of profunda femoral vein and great  saphenous vein unremarkable. No filling defects to suggest DVT on grayscale or color Doppler imaging. Doppler waveforms show normal direction of venous flow, normal respiratory plasticity and response to augmentation. Limited views of the contralateral common femoral vein are unremarkable. OTHER Peroneal veins in the calf are not optimally visualized for evaluation. Limitations: Examination was technically difficult due to patient's body habitus. IMPRESSION: There is no evidence of acute DVT in the major deep veins in the left lower extremity. Peroneal veins in the left calf are not adequately visualized for evaluation. Examination was technically difficult due to patient's body habitus. If symptoms persist, short-term follow-up examination as clinically warranted should be considered. Electronically Signed   By: Elmer Picker M.D.   On: 01/31/2022 13:41    Orders:  No orders of the defined types were placed in this encounter.  No orders of the defined types were placed in this encounter.    PMFS History: Patient Active Problem List   Diagnosis Date Noted   Hematoma of left lower leg    Wheezing 11/23/2021   Snoring 11/23/2021   Other secondary pulmonary hypertension ([place]) 11/23/2021   Hyponatremia 10/11/2021   Dyspnea on exertion    Dyslipidemia 10/04/2021   Mediastinal mass 10/04/2021   Obstructive sleep apnea 08/25/2021   RUQ pain 08/19/2021   Hepatic steatosis 08/19/2021   Hepatomegaly 08/19/2021   Aortic atherosclerosis ([place]) 08/19/2021   Diverticulosis 08/19/2021   Anxiety 08/02/2021   Bipolar 1 disorder ([place]) 08/02/2021  CHF (congestive heart failure) ([place]) 08/02/2021   Depression 08/02/2021   Joint pain 08/02/2021   Screening for malignant neoplasm of colon 08/02/2021   Prediabetes 12/22/2019   Vitamin D insufficiency 12/22/2019   Class 3 severe obesity with serious comorbidity and body mass index (BMI) greater than or equal to 70 in adult [facility]) 12/21/2019    Essential hypertension 04/30/2019   Mild intermittent asthma without complication 53/20/2334   Past Medical History:  Diagnosis Date   Anxiety    Aortic atherosclerosis ([place]) 08/19/2021   Arthritis    Asthma    Bipolar 1 disorder (HCC)    CHF (congestive heart failure) (HCC)    Class 3 severe obesity with serious comorbidity and body mass index (BMI) greater than or equal to 70 in adult ([place]) 12/21/2019   Depression    Diastolic dysfunction 35/68/6168   Diverticulosis 08/19/2021   Edema of both lower extremities    Hepatic steatosis 08/19/2021   Hepatomegaly 08/19/2021   High blood pressure    Joint pain    Pre-diabetes    Prediabetes 12/22/2019    Family History  Problem Relation Age of Onset   Heart Problems Mother        Broken heart syndrome   Hypertension Mother    Hypercholesterolemia Mother    Bone cancer Father    Testicular cancer Father    Heart attack Sister    Diabetes Brother    Colon cancer Maternal Grandfather    Dementia Paternal Grandmother     Past Surgical History:  Procedure Laterality Date   APPLICATION OF WOUND VAC Left 12/20/2021   Procedure: APPLICATION OF WOUND VAC;  Surgeon: [name], MD;  Location: [place];  Service: Orthopedics;  Laterality: Left;   I & D EXTREMITY Left 12/20/2021   Procedure: EXCISIONAL DEBRIDEMENT HEMATOMA LEFT LEG;  Surgeon: [name], MD;  Location: [facility];  Service: Orthopedics;  Laterality: Left;   NO PAST SURGERIES     RIGHT/LEFT HEART CATH AND CORONARY ANGIOGRAPHY N/A 10/06/2021   Procedure: RIGHT/LEFT HEART CATH AND CORONARY ANGIOGRAPHY;  Surgeon: Jettie Booze, MD;  Location: [facility];  Service: Cardiovascular;  Laterality: N/A;   Social History   Occupational History   Occupation: Medical illustrator, work from home  Tobacco Use   Smoking status: Never    Passive exposure: Never   Smokeless tobacco: Never  Vaping Use   Vaping Use: Never used  Substance and Sexual Activity   Alcohol use:  Not Currently    Comment: Maybe 2 glasses of wine a month   Drug use: Never   Sexual activity: Not on file

## 2022-01-31 NOTE — ED Notes (Signed)
X-ray at bedside

## 2022-01-31 NOTE — Telephone Encounter (Signed)
Spoke with pt who reports that she has gained 11 pounds in the past 24 hours, shortness of breath, tired, fatigue and left leg is 2 times it's normal size. Pt did have a [place] of her leg today. Advised to go to the ED for evaluation. PT verbalized understanding and had no additional questions.

## 2022-02-01 ENCOUNTER — Encounter: Payer: Commercial Managed Care - HMO | Admitting: Orthopedic Surgery

## 2022-02-02 ENCOUNTER — Telehealth: Payer: Self-pay | Admitting: Orthopedic Surgery

## 2022-02-02 NOTE — Telephone Encounter (Signed)
Pt called to set an appt for wound care. Pt did not stop at the desk to set appt and PA Erin nor Dr Sharol Given has any openings. Please call pt at [phone]

## 2022-02-05 ENCOUNTER — Ambulatory Visit (INDEPENDENT_AMBULATORY_CARE_PROVIDER_SITE_OTHER): Payer: Commercial Managed Care - HMO | Admitting: Orthopedic Surgery

## 2022-02-05 DIAGNOSIS — S8012XA Contusion of left lower leg, initial encounter: Secondary | ICD-10-CM

## 2022-02-05 NOTE — Telephone Encounter (Signed)
I called and sw pt and she is sch for today at 4pm for compression dressing application.

## 2022-02-06 ENCOUNTER — Encounter: Payer: Self-pay | Admitting: Orthopedic Surgery

## 2022-02-06 ENCOUNTER — Encounter: Payer: Self-pay | Admitting: Physician Assistant

## 2022-02-06 NOTE — Progress Notes (Deleted)
Cardiology Office Note    Date:  02/06/2022   ID:  Christina Dominguez, DOB [birth year], MRN [id number]  PCP:  Christina Lathe, PA-C  Cardiologist:  Christina More, MD  Electrophysiologist:  None   Chief Complaint: ***  History of Present Illness:   Christina Dominguez is a 56 y.o. female with history of HTN, HLD, chronic diastolic CHF, moderate pulmonary HTN, aortic atherosclerosis, Covid-19 in 10/2021 with intermittent asthma, mild OSA (followed by pulm), severe morbid obesity, bipolar disorder, anxiety, aortic atherosclerosis, pre-DM, hepatic steatosis, diverticulosis who is seen for follow-up. She was initially referred to cardiology in 08/2021 for concern for CHF, superimposed on multifactorial reasons for dyspnea including severe morbid obesity, prior Covid infection and asthma followed by pulmonology. Echo 08/2021 showed EF 55-60%, mild conc LVh, G1DD, normal RV, PASP not reported. In 10/2021 she was seen while inpatient for hypoxic/hypercapneic respiratory failure, and also tested positive for Covid after her neighbor visited. R/LHC showed EF 55-60%, moderate pulm HTN, mild volume overload, no significant CAD. LLE venous duplex x2 have been negative. She also has a history of unilateral leg swelling with large leg hematoma with necrotic fascia requiring debridement by ortho 12/2021, with subsequent poor wound healing. She was seen in ED 01/31/22 for SOB and leg swelling with overall reassuring workup per ED. She's been following with bariatric surgery team at outside facility ***  Whos looking at lipids No SGT2i because of weight GLP1?  Chronic diastolic CHF with moderate pulmonary HTN Essential HTN Aortic atherosclerosis with reported HLD Morbid obesity  Labwork independently reviewed: 01/2022 BNP wnl, trop neg, Hgb 11.4, plt 323, K 3.8, Cr 0.83 09/2021 LFTs ok, TSH wnl 08/2021 A1C 5.9 2021 LDL 88, trig 58  Cardiology Studies:   Studies reviewed are outlined and summarized above.  Reports included below if pertinent.   Cath 10/2021     The left ventricular systolic function is normal.   LV end diastolic pressure is moderately elevated.   The left ventricular ejection fraction is 55-65% by visual estimate.   There is no aortic valve stenosis.   No angiographically apparent coronary artery disease.   Hemodynamic findings consistent with moderate pulmonary hypertension.   On 2L/min [place] O2:Ao sat 99%, PA sat 71%, PA 53/30, mean PA pressure 43 mm Hg; mean PCWP 24 mm Hg; CO 7.35 L/min; CI 3.04.   Mild volume overload.  Continue medical therapy.    2d echo 08/20/21   1. Left ventricular ejection fraction, by estimation, is 55 to 60%. The  left ventricle has normal function. The left ventricle has no regional  wall motion abnormalities. There is mild concentric left ventricular  hypertrophy. Left ventricular diastolic  parameters are consistent with Grade I diastolic dysfunction (impaired  relaxation).   2. Right ventricular systolic function is normal. The right ventricular  size is normal. Tricuspid regurgitation signal is inadequate for assessing  PA pressure.   3. The mitral valve is normal in structure. No evidence of mitral valve  regurgitation. No evidence of mitral stenosis.   4. The aortic valve was not well visualized. Aortic valve regurgitation  is not visualized. No aortic stenosis is present.   5. The inferior vena cava is normal in size with <50% respiratory  variability, suggesting right atrial pressure of 8 mmHg.   Comparison(s): No prior Echocardiogram.     Past Medical History:  Diagnosis Date   Anxiety    Aortic atherosclerosis ([place]) 08/19/2021   Arthritis    Asthma  Bipolar 1 disorder (HCC)    CHF (congestive heart failure) (HCC)    Class 3 severe obesity with serious comorbidity and body mass index (BMI) greater than or equal to 70 in adult [facility]) 12/21/2019   Depression    Diastolic dysfunction 42/59/5638   Diverticulosis 08/19/2021    Edema of both lower extremities    Hepatic steatosis 08/19/2021   Hepatomegaly 08/19/2021   High blood pressure    Joint pain    Pre-diabetes    Prediabetes 12/22/2019    Past Surgical History:  Procedure Laterality Date   APPLICATION OF WOUND VAC Left 12/20/2021   Procedure: APPLICATION OF WOUND VAC;  Surgeon: [name], MD;  Location: [facility];  Service: Orthopedics;  Laterality: Left;   I & D EXTREMITY Left 12/20/2021   Procedure: EXCISIONAL DEBRIDEMENT HEMATOMA LEFT LEG;  Surgeon: [name], MD;  Location: [place] [facility];  Service: Orthopedics;  Laterality: Left;   NO PAST SURGERIES     RIGHT/LEFT HEART CATH AND CORONARY ANGIOGRAPHY N/A 10/06/2021   Procedure: RIGHT/LEFT HEART CATH AND CORONARY ANGIOGRAPHY;  Surgeon: Jettie Booze, MD;  Location: [facility];  Service: Cardiovascular;  Laterality: N/A;    Current Medications: No outpatient medications have been marked as taking for the 02/07/22 encounter (Appointment) with Charlie Pitter, PA-C.   ***   Allergies:   Aspirin, Eggs or egg-derived products, and Influenza vaccines   Social History   Socioeconomic History   Marital status: Single    Spouse name: Not on file   Number of children: Not on file   Years of education: Not on file   Highest education level: Not on file  Occupational History   Occupation: Medical illustrator, work from home  Tobacco Use   Smoking status: Never    Passive exposure: Never   Smokeless tobacco: Never  Vaping Use   Vaping Use: Never used  Substance and Sexual Activity   Alcohol use: Not Currently    Comment: Maybe 2 glasses of wine a month   Drug use: Never   Sexual activity: Not on file  Other Topics Concern   Not on file  Social History Narrative   Not on file   Social Determinants of Health   Financial Resource Strain: Medium Risk (10/05/2021)   Overall Financial Resource Strain (CARDIA)    Difficulty of Paying Living Expenses: Somewhat hard  Food Insecurity: No Food  Insecurity (10/05/2021)   Hunger Vital Sign    Worried About Running Out of Food in the Last Year: Never true    Ran Out of Food in the Last Year: Never true  Transportation Needs: No Transportation Needs (10/05/2021)   PRAPARE - Hydrologist (Medical): No    Lack of Transportation (Non-Medical): No  Physical Activity: Not on file  Stress: Not on file  Social Connections: Not on file     Family History:  The patient's ***family history includes Bone cancer in her father; Colon cancer in her maternal grandfather; Dementia in her paternal grandmother; Diabetes in her brother; Heart Problems in her mother; Heart attack in her sister; Hypercholesterolemia in her mother; Hypertension in her mother; Testicular cancer in her father.  ROS:   Please see the history of present illness. Otherwise, review of systems is positive for ***.  All other systems are reviewed and otherwise negative.    EKG(s)/Additional Labs   EKG:  EKG is ordered today, personally reviewed, demonstrating ***  Recent Labs: 10/03/2021:  ALT 19 12/26/2021: NT-Pro BNP 140 01/31/2022: B Natriuretic Peptide 42.2; BUN 20; Creatinine, Ser 0.83; Hemoglobin 11.4; Platelets 323; Potassium 3.8; Sodium 137  Recent Lipid Panel    Component Value Date/Time   CHOL 158 12/21/2019 1350   TRIG 58 12/21/2019 1350   HDL 58 12/21/2019 1350   LDLCALC 88 12/21/2019 1350    PHYSICAL EXAM:    VS:  There were no vitals taken for this visit.  BMI: There is no height or weight on file to calculate BMI.  GEN: Well nourished, well developed female in no acute distress HEENT: normocephalic, atraumatic Neck: no JVD, carotid bruits, or masses Cardiac: ***RRR; no murmurs, rubs, or gallops, no edema  Respiratory:  clear to auscultation bilaterally, normal work of breathing GI: soft, nontender, nondistended, + BS MS: no deformity or atrophy Skin: warm and dry, no rash Neuro:  Alert and Oriented x 3, Strength and  sensation are intact, follows commands Psych: euthymic mood, full affect  Wt Readings from Last 3 Encounters:  01/30/22 (!) 388 lb (176 kg)  12/28/21 (!) 376 lb (170.6 kg)  12/26/21 (!) 376 lb (170.6 kg)     ASSESSMENT & PLAN:   ***     Disposition: F/u with ***   Medication Adjustments/Labs and Tests Ordered: Current medicines are reviewed at length with the patient today.  Concerns regarding medicines are outlined above. Medication changes, Labs and Tests ordered today are summarized above and listed in the Patient Instructions accessible in Encounters.   Signed, Charlie Pitter, PA-C  02/06/2022 9:05 AM    [place] Phone: [phone]; Fax: [phone]

## 2022-02-06 NOTE — Progress Notes (Signed)
Office Visit Note   Patient: Christina Dominguez           Date of Birth: Oct 31, 1965           MRN: [id number] Visit Date: 02/05/2022              Requested by: Allwardt, Randa Evens, PA-C [place],  [place] [address] PCP: Fredirick Lathe, PA-C  Chief Complaint  Patient presents with   Left Leg - Routine Post Op    12/20/21 deb of hematoma      HPI: Patient is a 56 year old woman who was 2 months status post excisional debridement of hematoma.  Currently undergoing compression wraps.  Ultrasound was negative for DVT.  Assessment & Plan: Visit Diagnoses:  1. Hematoma of left lower leg     Plan: We will continue with silver cell to the wound Dynaflex compression wraps weekly.  Follow-Up Instructions: Return in about 1 week (around 02/12/2022) for Follow-up weekly for compression wraps.Manson Passey Exam  Patient is alert, oriented, no adenopathy, well-dressed, normal affect, normal respiratory effort. Examination there is fibrinous tissue over the wound bed this was debrided back to 75% healthy bleeding granulation tissue.  The wound is 3 x 6 cm.  Imaging: No results found. No images are attached to the encounter.  Labs: Lab Results  Component Value Date   HGBA1C 5.9 (H) 08/20/2021   HGBA1C 6.0 (H) 03/22/2020   HGBA1C 6.0 (H) 12/21/2019   CRP 1.6 (H) 10/09/2021   REPTSTATUS 12/25/2021 FINAL 12/20/2021   GRAMSTAIN NO WBC SEEN NO ORGANISMS SEEN  12/20/2021   CULT  12/20/2021    No growth aerobically or anaerobically. Performed at [facility], [place] [address] [place], [place] [address]      Lab Results  Component Value Date   ALBUMIN 3.8 10/03/2021   ALBUMIN 3.9 08/18/2021   ALBUMIN 4.2 06/28/2021    No results found for: "MG" Lab Results  Component Value Date   VD25OH 33.5 03/22/2020   VD25OH 28.6 (L) 12/21/2019    No results found for: "PREALBUMIN"    Latest Ref Rng & Units 01/31/2022    3:36 PM 12/20/2021    8:48 AM 10/10/2021    12:43 AM  CBC EXTENDED  WBC 4.0 - 10.5 K/uL 9.1  8.3  4.8   RBC 3.87 - 5.11 MIL/uL 3.83  4.02  4.12   Hemoglobin 12.0 - 15.0 g/dL 11.4  12.1  12.1   HCT 36.0 - 46.0 % 36.6  38.0  37.2   Platelets 150 - 400 K/uL 323  334  274      There is no height or weight on file to calculate BMI.  Orders:  No orders of the defined types were placed in this encounter.  No orders of the defined types were placed in this encounter.    Procedures: No procedures performed  Clinical Data: No additional findings.  ROS:  All other systems negative, except as noted in the HPI. Review of Systems  Objective: Vital Signs: There were no vitals taken for this visit.  Specialty Comments:  No specialty comments available.  PMFS History: Patient Active Problem List   Diagnosis Date Noted   Hematoma of left lower leg    Wheezing 11/23/2021   Snoring 11/23/2021   Other secondary pulmonary hypertension ([place]) 11/23/2021   Hyponatremia 10/11/2021   Dyspnea on exertion    Dyslipidemia 10/04/2021   Mediastinal mass 10/04/2021   Obstructive sleep  apnea 08/25/2021   RUQ pain 08/19/2021   Hepatic steatosis 08/19/2021   Hepatomegaly 08/19/2021   Aortic atherosclerosis ([place]) 08/19/2021   Diverticulosis 08/19/2021   Anxiety 08/02/2021   Bipolar 1 disorder ([place]) 08/02/2021   CHF (congestive heart failure) ([place]) 08/02/2021   Depression 08/02/2021   Joint pain 08/02/2021   Screening for malignant neoplasm of colon 08/02/2021   Prediabetes 12/22/2019   Vitamin D insufficiency 12/22/2019   Class 3 severe obesity with serious comorbidity and body mass index (BMI) greater than or equal to 70 in adult [facility]) 12/21/2019   Essential hypertension 04/30/2019   Mild intermittent asthma without complication 84/13/2440   Past Medical History:  Diagnosis Date   Anxiety    Aortic atherosclerosis ([place]) 08/19/2021   Arthritis    Asthma    Bipolar 1 disorder (HCC)    Chronic diastolic CHF (congestive heart  failure) (HCC)    Class 3 severe obesity with serious comorbidity and body mass index (BMI) greater than or equal to 70 in adult ([place]) 12/21/2019   Depression    Diastolic dysfunction 06/02/2535   Diverticulosis 08/19/2021   Edema of both lower extremities    Hepatic steatosis 08/19/2021   Hepatomegaly 08/19/2021   High blood pressure    Hyperlipidemia    Joint pain    Morbid obesity ([place])    Pre-diabetes     Family History  Problem Relation Age of Onset   Heart Problems Mother        Broken heart syndrome   Hypertension Mother    Hypercholesterolemia Mother    Bone cancer Father    Testicular cancer Father    Heart attack Sister    Diabetes Brother    Colon cancer Maternal Grandfather    Dementia Paternal Grandmother     Past Surgical History:  Procedure Laterality Date   APPLICATION OF WOUND VAC Left 12/20/2021   Procedure: APPLICATION OF WOUND VAC;  Surgeon: [name], MD;  Location: [facility];  Service: Orthopedics;  Laterality: Left;   I & D EXTREMITY Left 12/20/2021   Procedure: EXCISIONAL DEBRIDEMENT HEMATOMA LEFT LEG;  Surgeon: [name], MD;  Location: [facility];  Service: Orthopedics;  Laterality: Left;   NO PAST SURGERIES     RIGHT/LEFT HEART CATH AND CORONARY ANGIOGRAPHY N/A 10/06/2021   Procedure: RIGHT/LEFT HEART CATH AND CORONARY ANGIOGRAPHY;  Surgeon: Jettie Booze, MD;  Location: [facility];  Service: Cardiovascular;  Laterality: N/A;   Social History   Occupational History   Occupation: Medical illustrator, work from home  Tobacco Use   Smoking status: Never    Passive exposure: Never   Smokeless tobacco: Never  Vaping Use   Vaping Use: Never used  Substance and Sexual Activity   Alcohol use: Not Currently    Comment: Maybe 2 glasses of wine a month   Drug use: Never   Sexual activity: Not on file

## 2022-02-07 ENCOUNTER — Ambulatory Visit: Payer: Commercial Managed Care - HMO | Admitting: Physician Assistant

## 2022-02-07 DIAGNOSIS — I5032 Chronic diastolic (congestive) heart failure: Secondary | ICD-10-CM

## 2022-02-07 DIAGNOSIS — I272 Pulmonary hypertension, unspecified: Secondary | ICD-10-CM

## 2022-02-07 DIAGNOSIS — I700 Atherosclerosis of aorta: Secondary | ICD-10-CM

## 2022-02-07 DIAGNOSIS — I10 Essential (primary) hypertension: Secondary | ICD-10-CM

## 2022-02-09 ENCOUNTER — Telehealth: Payer: Commercial Managed Care - HMO | Admitting: Physician Assistant

## 2022-02-09 DIAGNOSIS — K580 Irritable bowel syndrome with diarrhea: Secondary | ICD-10-CM | POA: Diagnosis not present

## 2022-02-09 MED ORDER — DICYCLOMINE HCL 10 MG PO CAPS: 10.0000 mg | ORAL_CAPSULE | Freq: Two times a day (BID) | ORAL | 0 refills | Status: DC

## 2022-02-09 NOTE — Progress Notes (Signed)
Virtual Visit Consent   Christina Dominguez, you are scheduled for a virtual visit with a [facility] provider today. Just as with appointments in the office, your consent must be obtained to participate. Your consent will be active for this visit and any virtual visit you may have with one of our providers in the next 365 days. If you have a MyChart account, a copy of this consent can be sent to you electronically.  As this is a virtual visit, video technology does not allow for your provider to perform a traditional examination. This may limit your provider's ability to fully assess your condition. If your provider identifies any concerns that need to be evaluated in person or the need to arrange testing (such as labs, EKG, etc.), we will make arrangements to do so. Although advances in technology are sophisticated, we cannot ensure that it will always work on either your end or our end. If the connection with a video visit is poor, the visit may have to be switched to a telephone visit. With either a video or telephone visit, we are not always able to ensure that we have a secure connection.  By engaging in this virtual visit, you consent to the provision of healthcare and authorize for your insurance to be billed (if applicable) for the services provided during this visit. Depending on your insurance coverage, you may receive a charge related to this service.  I need to obtain your verbal consent now. Are you willing to proceed with your visit today? Christina Dominguez has provided verbal consent on 02/09/2022 for a virtual visit (video or telephone). Mar Daring, PA-C  Date: 02/09/2022 1:33 PM  Virtual Visit via Video Note   IMar Daring, connected with  Christina Dominguez  ([id number], 29-Aug-1965) on 02/09/22 at  1:15 PM EDT by a video-enabled telemedicine application and verified that I am speaking with the correct person using two identifiers.  Location: Patient: Virtual Visit  Location Patient: Home Provider: Virtual Visit Location Provider: Home Office   I discussed the limitations of evaluation and management by telemedicine and the availability of in person appointments. The patient expressed understanding and agreed to proceed.    History of Present Illness: Christina Dominguez is a 56 y.o. who identifies as a female who was assigned female at birth, and is being seen today for diarrhea.  HPI: Diarrhea  This is a new problem. The current episode started in the past 7 days (3 days; had similar episodes about 2-3 weeks ago improved with BRAT diet; reports she does have chronic loose stools with 2-3 normal; reports abd cramping and mild bloating after meals then loose stool as normal. Symptoms increase with stress.). The problem occurs more than 10 times per day. The problem has been gradually improving (none today). The stool consistency is described as Watery. The patient states that diarrhea does not awaken her from sleep. Associated symptoms include abdominal pain and increased flatus. Pertinent negatives include no bloating, chills, fever, myalgias, sweats, URI or vomiting. Associated symptoms comments: Fecal incontinence, nausea. Nothing aggravates the symptoms. There are no known risk factors. She has tried increased fluids and change of diet (BRAT, pepto bismol (BRAT diet is only thing that helped)) for the symptoms. The treatment provided no relief.     Problems:  Patient Active Problem List   Diagnosis Date Noted   Hematoma of left lower leg    Wheezing 11/23/2021   Snoring 11/23/2021   Other secondary  pulmonary hypertension ([place]) 11/23/2021   Hyponatremia 10/11/2021   Dyspnea on exertion    Dyslipidemia 10/04/2021   Mediastinal mass 10/04/2021   Obstructive sleep apnea 08/25/2021   RUQ pain 08/19/2021   Hepatic steatosis 08/19/2021   Hepatomegaly 08/19/2021   Aortic atherosclerosis ([place]) 08/19/2021   Diverticulosis 08/19/2021   Anxiety 08/02/2021    Bipolar 1 disorder ([place]) 08/02/2021   CHF (congestive heart failure) ([place]) 08/02/2021   Depression 08/02/2021   Joint pain 08/02/2021   Screening for malignant neoplasm of colon 08/02/2021   Prediabetes 12/22/2019   Vitamin D insufficiency 12/22/2019   Class 3 severe obesity with serious comorbidity and body mass index (BMI) greater than or equal to 70 in adult [facility]) 12/21/2019   Essential hypertension 04/30/2019   Mild intermittent asthma without complication 59/74/1638    Allergies:  Allergies  Allergen Reactions   Aspirin Nausea And Vomiting   Eggs Or Egg-Derived Products Swelling   Influenza Vaccines Swelling   Medications:  Current Outpatient Medications:    dicyclomine (BENTYL) 10 MG capsule, Take 1 capsule (10 mg total) by mouth in the morning and at bedtime., Disp: 40 capsule, Rfl: 0   acetaminophen (TYLENOL) 650 MG CR tablet, Take 1,300 mg by mouth every 8 (eight) hours as needed for pain., Disp: , Rfl:    albuterol (VENTOLIN HFA) 108 (90 Base) MCG/ACT inhaler, Inhale 2 puffs into the lungs every 6 (six) hours as needed for wheezing or shortness of breath., Disp: 8 g, Rfl: 6   atorvastatin (LIPITOR) 40 MG tablet, Take 1 tablet (40 mg total) by mouth daily., Disp: 30 tablet, Rfl: 0   CATS CLAW, UNCARIA TOMENTOSA, PO, Take 2 capsules by mouth daily., Disp: , Rfl:    Cholecalciferol (VITAMIN D) 50 MCG (2000 UT) tablet, Take 2,000 Units by mouth daily., Disp: , Rfl:    diclofenac Sodium (VOLTAREN) 1 % GEL, Apply 2 g topically daily as needed (pain)., Disp: , Rfl:    diphenhydrAMINE (BENADRYL) 25 MG tablet, Take 50 mg by mouth at bedtime as needed for sleep., Disp: , Rfl:    furosemide (LASIX) 80 MG tablet, TAKE 1 AND 1/2 TABLET BY MOUTH EVERY DAY FOR 4 DAYS WITH '40MG'$  POTASSIUM, THEN RESUME 1 TABLET DAILY AND '20MG'$  POTASSIUM (Patient taking differently: Take 80 mg by mouth daily.), Disp: 96 tablet, Rfl: 0   HYDROcodone-acetaminophen (NORCO/VICODIN) 5-325 MG tablet, Take 1 tablet by  mouth every 4 (four) hours as needed., Disp: 30 tablet, Rfl: 0   OVER THE COUNTER MEDICATION, Take 3 tablets by mouth daily. Mobility essentials supplement, Disp: , Rfl:    potassium chloride SA (KLOR-CON M) 20 MEQ tablet, Take 1 tablet (20 mEq total) by mouth daily., Disp: 30 tablet, Rfl: 5   TURMERIC PO, Take 1 tablet by mouth daily., Disp: , Rfl:    valsartan (DIOVAN) 40 MG tablet, Take 1 tablet (40 mg total) by mouth daily., Disp: 90 tablet, Rfl: 3  Observations/Objective: Patient is well-developed, well-nourished in no acute distress.  Resting comfortably at home.  Head is normocephalic, atraumatic.  No labored breathing.  Speech is clear and coherent with logical content.  Patient is alert and oriented at baseline.    Assessment and Plan: 1. Irritable bowel syndrome with diarrhea - dicyclomine (BENTYL) 10 MG capsule; Take 1 capsule (10 mg total) by mouth in the morning and at bedtime.  Dispense: 40 capsule; Refill: 0  - Suspect IBS-D - Will trial Dicyclomine as above - Can use Imodium OTC - Continue diet  from bariatrics - Push fluids - Follow up with PCP in next 2-4 weeks  Follow Up Instructions: I discussed the assessment and treatment plan with the patient. The patient was provided an opportunity to ask questions and all were answered. The patient agreed with the plan and demonstrated an understanding of the instructions.  A copy of instructions were sent to the patient via MyChart unless otherwise noted below.    The patient was advised to call back or seek an in-person evaluation if the symptoms worsen or if the condition fails to improve as anticipated.  Time:  I spent 15 minutes with the patient via telehealth technology discussing the above problems/concerns.    Mar Daring, PA-C

## 2022-02-09 NOTE — Patient Instructions (Signed)
Christina Dominguez, thank you for joining Mar Daring, PA-C for today's virtual visit.  While this provider is not your primary care provider (PCP), if your PCP is located in our provider database this encounter information will be shared with them immediately following your visit.  Consent: (Patient) Christina Dominguez provided verbal consent for this virtual visit at the beginning of the encounter.  Current Medications:  Current Outpatient Medications:    dicyclomine (BENTYL) 10 MG capsule, Take 1 capsule (10 mg total) by mouth in the morning and at bedtime., Disp: 40 capsule, Rfl: 0   acetaminophen (TYLENOL) 650 MG CR tablet, Take 1,300 mg by mouth every 8 (eight) hours as needed for pain., Disp: , Rfl:    albuterol (VENTOLIN HFA) 108 (90 Base) MCG/ACT inhaler, Inhale 2 puffs into the lungs every 6 (six) hours as needed for wheezing or shortness of breath., Disp: 8 g, Rfl: 6   atorvastatin (LIPITOR) 40 MG tablet, Take 1 tablet (40 mg total) by mouth daily., Disp: 30 tablet, Rfl: 0   CATS CLAW, UNCARIA TOMENTOSA, PO, Take 2 capsules by mouth daily., Disp: , Rfl:    Cholecalciferol (VITAMIN D) 50 MCG (2000 UT) tablet, Take 2,000 Units by mouth daily., Disp: , Rfl:    diclofenac Sodium (VOLTAREN) 1 % GEL, Apply 2 g topically daily as needed (pain)., Disp: , Rfl:    diphenhydrAMINE (BENADRYL) 25 MG tablet, Take 50 mg by mouth at bedtime as needed for sleep., Disp: , Rfl:    furosemide (LASIX) 80 MG tablet, TAKE 1 AND 1/2 TABLET BY MOUTH EVERY DAY FOR 4 DAYS WITH '40MG'$  POTASSIUM, THEN RESUME 1 TABLET DAILY AND '20MG'$  POTASSIUM (Patient taking differently: Take 80 mg by mouth daily.), Disp: 96 tablet, Rfl: 0   HYDROcodone-acetaminophen (NORCO/VICODIN) 5-325 MG tablet, Take 1 tablet by mouth every 4 (four) hours as needed., Disp: 30 tablet, Rfl: 0   OVER THE COUNTER MEDICATION, Take 3 tablets by mouth daily. Mobility essentials supplement, Disp: , Rfl:    potassium chloride SA (KLOR-CON M) 20  MEQ tablet, Take 1 tablet (20 mEq total) by mouth daily., Disp: 30 tablet, Rfl: 5   TURMERIC PO, Take 1 tablet by mouth daily., Disp: , Rfl:    valsartan (DIOVAN) 40 MG tablet, Take 1 tablet (40 mg total) by mouth daily., Disp: 90 tablet, Rfl: 3   Medications ordered in this encounter:  Meds ordered this encounter  Medications   dicyclomine (BENTYL) 10 MG capsule    Sig: Take 1 capsule (10 mg total) by mouth in the morning and at bedtime.    Dispense:  40 capsule    Refill:  0    Order Specific Question:   Supervising Provider    Answer:   Sabra Heck, [place]     *If you need refills on other medications prior to your next appointment, please contact your pharmacy*  Follow-Up: Call back or seek an in-person evaluation if the symptoms worsen or if the condition fails to improve as anticipated.  Other Instructions Irritable Bowel Syndrome, Adult  Irritable bowel syndrome (IBS) is a group of symptoms that affects the organs responsible for digestion (gastrointestinal tract, or GI tract). IBS is not one specific disease. To regulate how the GI tract works, the body sends signals back and forth between the intestines and the brain. If you have IBS, there may be a problem with these signals. As a result, the GI tract does not function normally. The intestines may become more sensitive  and overreact to certain things. This may be especially true when you eat certain foods or when you are under stress. There are four main types of IBS. These may be determined based on the consistency of your stool (feces): IBS with mostly (predominance of) diarrhea. IBS with predominance of constipation. IBS with mixed bowel habits. This includes both diarrhea and constipation. IBS unclassified. This includes IBS that cannot be categorized into one of the other three main types. It is important to know which type of IBS you have. Certain treatments are more likely to be helpful for certain types of IBS. What  are the causes? The exact cause of IBS is not known. What increases the risk? You may have a higher risk for IBS if you: Are female. Are younger than 40 years. Have a family history of IBS. Have a mental health condition, such as depression, anxiety, or post-traumatic stress disorder. Have had a bacterial infection of your GI tract. What are the signs or symptoms? Symptoms of IBS vary from person to person. The main symptom is abdominal pain or discomfort. Other symptoms usually include one or more of the following: Diarrhea, constipation, or both. Swelling or bloating in the abdomen. Feeling full after eating a small or regular-sized meal. Frequent gas. Mucus in the stool. A feeling of having more stool left after a bowel movement. Symptoms tend to come and go. They may be triggered by stress, mental health conditions, or certain foods. How is this diagnosed? This condition may be diagnosed based on a physical exam, your medical history, and your symptoms. You may have tests, such as: Blood tests. Stool test. Colonoscopy. This is a procedure in which your GI tract is viewed with a long, thin, flexible tube. How is this treated? There is no cure for IBS, but treatment can help relieve symptoms. Treatment depends on the type of IBS you have, and may include: Changes to your diet, such as: Avoiding foods that cause symptoms. Drinking more water. Following a low-FODMAP (fermentable oligosaccharides, disaccharides, monosaccharides, and polyols) diet for up to 6 weeks, or as told by your health care provider. FODMAPs are sugars that are hard for some people to digest. Eating more fiber. Eating small meals at the same times every day. Medicines. These may include: Fiber supplements, if you have constipation. Medicine to control diarrhea (antidiarrheal medicines). Medicine to help control muscle tightening (spasms) in your GI tract (antispasmodic medicines). Medicines to help with mental  health conditions, such as antidepressants. Talk therapy or counseling. Working with a dietitian to help create a food plan that is right for you. Managing your stress. Follow these instructions at home: Eating and drinking  Eat a healthy diet. Eat 5-6 small meals a day. Try to eat meals at about the same times each day. Do not eat large meals. Gradually eat more fiber-rich foods. These include whole grains, fruits, and vegetables. This may be especially helpful if you have IBS with constipation. Eat a diet low in FODMAPs. You may need to avoid foods such as citrus fruits, cabbage, garlic, and onions. Drink enough fluid to keep your urine pale yellow. Keep a journal of foods that seem to trigger symptoms. Avoid foods and drinks that: Contain added sugar. Make your symptoms worse. These may include dairy products, caffeinated drinks, and carbonated drinks. Alcohol use Do not drink alcohol if: Your health care provider tells you not to drink. You are pregnant, may be pregnant, or are planning to become pregnant. If you drink alcohol:  Limit how much you have to: 0-1 drink a day for women. 0-2 drinks a day for men. Know how much alcohol is in your drink. In the [place] one drink equals one 12 oz bottle of beer (355 mL), one 5 oz glass of wine (148 mL), or one 1 oz glass of hard liquor (44 mL) General instructions Take over-the-counter and prescription medicines only as told by your health care provider. This includes supplements. Get enough exercise. Do at least 150 minutes of moderate-intensity exercise each week. Manage your stress. Getting enough sleep and exercise can help you manage stress. Keep all follow-up visits. This is important. This includes all visits with your health care provider and therapist. Where to find more information International Foundation for Functional Gastrointestinal Disorders: aboutibs.[employer] of Diabetes and Digestive and Kidney Diseases:  [employer].is Contact a health care provider if: You have constant pain. You lose weight. You have diarrhea that gets worse. You have bleeding from the rectum. You vomit often. You have a fever. Get help right away if: You have severe abdominal pain. You have diarrhea with symptoms of dehydration, such as dizziness or dry mouth. You have bloody or black stools. You have severe abdominal bloating. You have vomiting that does not stop. You have blood in your vomit. Summary Irritable bowel syndrome (IBS) is not one specific disease. It is a group of symptoms that affects digestion. Your intestines may become more sensitive and overreact to certain things. This may be especially true when you eat certain foods or when you are under stress. There is no cure for IBS, but treatment can help relieve symptoms. This information is not intended to replace advice given to you by your health care provider. Make sure you discuss any questions you have with your health care provider. Document Revised: 07/05/2021 Document Reviewed: 07/05/2021 Elsevier Patient Education  [place] for Irritable Bowel Syndrome When you have irritable bowel syndrome (IBS), it is very important to follow the eating habits that are best for your condition. IBS may cause various symptoms, such as pain in the abdomen, constipation, or diarrhea. Choosing the right foods can help to ease the discomfort from these symptoms. Work with your health care provider and dietitian to find the eating plan that will help to control your symptoms. What are tips for following this plan?  Keep a food diary. This will help you identify foods that cause symptoms. Write down: What you eat and when you eat it. What symptoms you have. When symptoms occur in relation to your meals, such as "pain in abdomen 2 hours after dinner." Eat your meals slowly and in a relaxed setting. Aim to eat 5-6 small meals per day. Do not skip  meals. Drink enough fluid to keep your urine pale yellow. Ask your health care provider if you should take an over-the-counter probiotic to help restore healthy bacteria in your gut (digestive tract). Probiotics are foods that contain good bacteria and yeasts. Your dietitian may have specific dietary recommendations for you based on your symptoms. Your dietitian may recommend that you: Avoid foods that cause symptoms. Talk with your dietitian about other ways to get the same nutrients that are in those problem foods. Avoid foods with gluten. Gluten is a protein that is found in rye, wheat, and barley. Eat more foods that contain soluble fiber. Examples of foods with high soluble fiber include oats, seeds, and certain fruits and vegetables. Take a fiber supplement if told by your  dietitian. Reduce or avoid certain foods called FODMAPs. These are foods that contain sugars that are hard for some people to digest. Ask your health care provider which foods to avoid. What foods should I avoid? The following are some foods and drinks that may make your symptoms worse: Fatty foods, such as french fries. Foods that contain gluten, such as pasta and cereal. Dairy products, such as milk, cheese, and ice cream. Spicy foods. Alcohol. Products with caffeine, such as coffee, tea, or chocolate. Carbonated drinks, such as soda. Foods that are high in FODMAPs. These include certain fruits and vegetables. Products with sweeteners such as honey, high fructose corn syrup, sorbitol, and mannitol. The items listed above may not be a complete list of foods and beverages you should avoid. Contact a dietitian for more information. What foods are good sources of fiber? Your health care provider or dietitian may recommend that you eat more foods that contain fiber. Fiber can help to reduce constipation and other IBS symptoms. Add foods with fiber to your diet a little at a time so your body can get used to them. Too much  fiber at one time might cause gas and swelling of your abdomen. The following are some foods that are good sources of fiber: Berries, such as raspberries, strawberries, and blueberries. Tomatoes. Carrots. Brown rice. Oats. Seeds, such as chia and pumpkin seeds. The items listed above may not be a complete list of recommended sources of fiber. Contact your dietitian for more options. Where to find more information International Foundation for Functional Gastrointestinal Disorders: aboutibs.[employer] of Diabetes and Digestive and Kidney Diseases: [employer].is Summary When you have irritable bowel syndrome (IBS), it is very important to follow the eating habits that are best for your condition. IBS may cause various symptoms, such as pain in the abdomen, constipation, or diarrhea. Choosing the right foods can help to ease the discomfort that comes from symptoms. Your health care provider or dietitian may recommend that you eat more foods that contain fiber. Keep a food diary. This will help you identify foods that cause symptoms. This information is not intended to replace advice given to you by your health care provider. Make sure you discuss any questions you have with your health care provider. Document Revised: 07/04/2021 Document Reviewed: 07/04/2021 Elsevier Patient Education  [place].    If you have been instructed to have an in-person evaluation today at a local Urgent Care facility, please use the link below. It will take you to a list of all of our available [facility], including address, phone number and hours of operation. Please do not delay care.  [facility]  If you or a family member do not have a primary care provider, use the link below to schedule a visit and establish care. When you choose a [facility] primary care physician or advanced practice provider, you gain a long-term partner in health. Find a Primary Care  Provider  Learn more about [facility] in-office and virtual care options: [facility]

## 2022-02-13 ENCOUNTER — Ambulatory Visit (INDEPENDENT_AMBULATORY_CARE_PROVIDER_SITE_OTHER): Payer: Commercial Managed Care - HMO | Admitting: Cardiology

## 2022-02-13 ENCOUNTER — Encounter: Payer: Self-pay | Admitting: Cardiology

## 2022-02-13 VITALS — BP 132/78 | HR 87 | Ht 60.0 in | Wt 381.1 lb

## 2022-02-13 DIAGNOSIS — G4733 Obstructive sleep apnea (adult) (pediatric): Secondary | ICD-10-CM

## 2022-02-13 DIAGNOSIS — I10 Essential (primary) hypertension: Secondary | ICD-10-CM

## 2022-02-13 DIAGNOSIS — I2729 Other secondary pulmonary hypertension: Secondary | ICD-10-CM

## 2022-02-13 DIAGNOSIS — I5032 Chronic diastolic (congestive) heart failure: Secondary | ICD-10-CM

## 2022-02-13 DIAGNOSIS — R0609 Other forms of dyspnea: Secondary | ICD-10-CM

## 2022-02-13 DIAGNOSIS — Z6841 Body Mass Index (BMI) 40.0 and over, adult: Secondary | ICD-10-CM

## 2022-02-13 NOTE — Addendum Note (Signed)
Addended by: Jacobo Forest D on: 02/13/2022 02:50 PM   Modules accepted: Orders

## 2022-02-13 NOTE — Patient Instructions (Signed)
Medication Instructions:  Your physician recommends that you continue on your current medications as directed. Please refer to the Current Medication list given to you today.  *If you need a refill on your cardiac medications before your next appointment, please call your pharmacy*   Lab Work: BMP, [place] [address] [place] [address] If you have labs (blood work) drawn today and your tests are completely normal, you will receive your results only by: [place] (if you have MyChart) OR A paper copy in the mail If you have any lab test that is abnormal or we need to change your treatment, we will call you to review the results.   Testing/Procedures: None Ordered   Follow-Up: At [facility], you and your health needs are our priority.  As part of our continuing mission to provide you with exceptional heart care, we have created designated Provider Care Teams.  These Care Teams include your primary Cardiologist (physician) and Advanced Practice Providers (APPs -  Physician Assistants and Nurse Practitioners) who all work together to provide you with the care you need, when you need it.  We recommend signing up for the patient portal called "MyChart".  Sign up information is provided on this After Visit Summary.  MyChart is used to connect with patients for Virtual Visits (Telemedicine).  Patients are able to view lab/test results, encounter notes, upcoming appointments, etc.  Non-urgent messages can be sent to your provider as well.   To learn more about what you can do with MyChart, go to [email or web address].    Your next appointment:   2 month(s)  The format for your next appointment:   In Person  Provider:   Jenne Campus, MD    Other Instructions NA

## 2022-02-13 NOTE — Progress Notes (Signed)
Cardiology Office Note:    Date:  02/13/2022   ID:  CLYDETTE PRIVITERA, DOB 10-Apr-1966, MRN [id number]  PCP:  Fredirick Lathe, PA-C  Cardiologist:  Jenne Campus, MD    Referring MD: Fredirick Lathe, PA-C   Chief Complaint  Patient presents with   Follow-up   Leg Swelling    History of Present Illness:    Christina Dominguez is a 56 y.o. female with a very complex story she does have diastolic congestive heart failure, severe obesity, obstructive sleep apnea, pulmonary hypertension, dyslipidemia.  She was referred to [place] for help with the management of her pulmonary hypertension.  Blood pressure was in the neighborhood of 45 mmHg based on cardiac catheterization.  She is not doing well she sustained injury to her leg she cannot exercise now.  She can have some weight she had up going to the emergency room because of shortness of breath.  In the emergency room documentation said that she skipped some of the diuretics.  She was given extra diuretic and she is actually urinated a lot and started feeling better swelling of lower extremities better but still a long way to go.  Past Medical History:  Diagnosis Date   Anxiety    Aortic atherosclerosis ([place]) 08/19/2021   Arthritis    Asthma    Bipolar 1 disorder (HCC)    Chronic diastolic CHF (congestive heart failure) (HCC)    Class 3 severe obesity with serious comorbidity and body mass index (BMI) greater than or equal to 70 in adult [facility]) 12/21/2019   Depression    Diastolic dysfunction 19/62/2297   Diverticulosis 08/19/2021   Edema of both lower extremities    Hepatic steatosis 08/19/2021   Hepatomegaly 08/19/2021   High blood pressure    Hyperlipidemia    Joint pain    Morbid obesity ([place])    Pre-diabetes     Past Surgical History:  Procedure Laterality Date   APPLICATION OF WOUND VAC Left 12/20/2021   Procedure: APPLICATION OF WOUND VAC;  Surgeon: [name], MD;  Location: [place];  Service: Orthopedics;   Laterality: Left;   I & D EXTREMITY Left 12/20/2021   Procedure: EXCISIONAL DEBRIDEMENT HEMATOMA LEFT LEG;  Surgeon: [name], MD;  Location: [place];  Service: Orthopedics;  Laterality: Left;   NO PAST SURGERIES     RIGHT/LEFT HEART CATH AND CORONARY ANGIOGRAPHY N/A 10/06/2021   Procedure: RIGHT/LEFT HEART CATH AND CORONARY ANGIOGRAPHY;  Surgeon: Jettie Booze, MD;  Location: [place] CV LAB;  Service: Cardiovascular;  Laterality: N/A;    Current Medications: Current Meds  Medication Sig   acetaminophen (TYLENOL) 650 MG CR tablet Take 1,300 mg by mouth every 8 (eight) hours as needed for pain.   albuterol (VENTOLIN HFA) 108 (90 Base) MCG/ACT inhaler Inhale 2 puffs into the lungs every 6 (six) hours as needed for wheezing or shortness of breath.   atorvastatin (LIPITOR) 40 MG tablet Take 1 tablet (40 mg total) by mouth daily.   CATS CLAW, UNCARIA TOMENTOSA, PO Take 2 capsules by mouth daily.   Cholecalciferol (VITAMIN D) 50 MCG (2000 UT) tablet Take 2,000 Units by mouth daily.   diclofenac Sodium (VOLTAREN) 1 % GEL Apply 2 g topically daily as needed (pain).   dicyclomine (BENTYL) 10 MG capsule Take 1 capsule (10 mg total) by mouth in the morning and at bedtime.   diphenhydrAMINE (BENADRYL) 25 MG tablet Take 50 mg by mouth at bedtime as needed for sleep.  furosemide (LASIX) 80 MG tablet TAKE 1 AND 1/2 TABLET BY MOUTH EVERY DAY FOR 4 DAYS WITH '40MG'$  POTASSIUM, THEN RESUME 1 TABLET DAILY AND '20MG'$  POTASSIUM (Patient taking differently: Take 80 mg by mouth daily.)   OVER THE COUNTER MEDICATION Take 3 tablets by mouth daily. Mobility essentials supplement   potassium chloride SA (KLOR-CON M) 20 MEQ tablet Take 1 tablet (20 mEq total) by mouth daily.   TURMERIC PO Take 1 tablet by mouth daily.   valsartan (DIOVAN) 40 MG tablet Take 1 tablet (40 mg total) by mouth daily.   [DISCONTINUED] HYDROcodone-acetaminophen (NORCO/VICODIN) 5-325 MG tablet Take 1 tablet by mouth every 4 (four) hours  as needed. (Patient taking differently: Take 1 tablet by mouth every 4 (four) hours as needed for moderate pain or severe pain.)     Allergies:   Aspirin, Eggs or egg-derived products, and Influenza vaccines   Social History   Socioeconomic History   Marital status: Single    Spouse name: Not on file   Number of children: Not on file   Years of education: Not on file   Highest education level: Not on file  Occupational History   Occupation: Medical illustrator, work from home  Tobacco Use   Smoking status: Never    Passive exposure: Never   Smokeless tobacco: Never  Vaping Use   Vaping Use: Never used  Substance and Sexual Activity   Alcohol use: Not Currently    Comment: Maybe 2 glasses of wine a month   Drug use: Never   Sexual activity: Not on file  Other Topics Concern   Not on file  Social History Narrative   Not on file   Social Determinants of Health   Financial Resource Strain: Medium Risk (10/05/2021)   Overall Financial Resource Strain (CARDIA)    Difficulty of Paying Living Expenses: Somewhat hard  Food Insecurity: No Food Insecurity (10/05/2021)   Hunger Vital Sign    Worried About Running Out of Food in the Last Year: Never true    Ran Out of Food in the Last Year: Never true  Transportation Needs: No Transportation Needs (10/05/2021)   PRAPARE - Hydrologist (Medical): No    Lack of Transportation (Non-Medical): No  Physical Activity: Not on file  Stress: Not on file  Social Connections: Not on file     Family History: The patient's family history includes Bone cancer in her father; Colon cancer in her maternal grandfather; Dementia in her paternal grandmother; Diabetes in her brother; Heart Problems in her mother; Heart attack in her sister; Hypercholesterolemia in her mother; Hypertension in her mother; Testicular cancer in her father. ROS:   Please see the history of present illness.    All 14 point review of systems negative  except as described per history of present illness  EKGs/Labs/Other Studies Reviewed:      Recent Labs: 10/03/2021: ALT 19 12/26/2021: NT-Pro BNP 140 01/31/2022: B Natriuretic Peptide 42.2; BUN 20; Creatinine, Ser 0.83; Hemoglobin 11.4; Platelets 323; Potassium 3.8; Sodium 137  Recent Lipid Panel    Component Value Date/Time   CHOL 158 12/21/2019 1350   TRIG 58 12/21/2019 1350   HDL 58 12/21/2019 1350   LDLCALC 88 12/21/2019 1350    Physical Exam:    VS:  BP 132/78 (BP Location: Right Arm, Patient Position: Sitting)   Pulse 87   Ht 5' (1.524 m)   Wt (!) 381 lb 1.9 oz (172.9 kg)   SpO2 97%  BMI 74.43 kg/m     Wt Readings from Last 3 Encounters:  02/13/22 (!) 381 lb 1.9 oz (172.9 kg)  01/30/22 (!) 388 lb (176 kg)  12/28/21 (!) 376 lb (170.6 kg)     GEN:  Well nourished, well developed in no acute distress HEENT: Normal NECK: No JVD; No carotid bruits LYMPHATICS: No lymphadenopathy CARDIAC: RRR, no murmurs, no rubs, no gallops RESPIRATORY:  Clear to auscultation without rales, wheezing or rhonchi  ABDOMEN: Soft, non-tender, non-distended MUSCULOSKELETAL:  No edema; No deformity  SKIN: Warm and dry LOWER EXTREMITIES: Chronic swelling NEUROLOGIC:  Alert and oriented x 3 PSYCHIATRIC:  Normal affect   ASSESSMENT:    1. Other secondary pulmonary hypertension ([place])   2. Chronic diastolic congestive heart failure ([place])   3. Essential hypertension   4. Obstructive sleep apnea   5. Class 3 severe obesity due to excess calories with serious comorbidity and body mass index (BMI) greater than or equal to 70 in adult [facility])    PLAN:    In order of problems listed above:  Diastolic congestive heart failure.  I will check Chem-7 today as well as proBNP based on that to decide about how aggressive we can be with diuretics I suspect we will get a couple more pounds to go clinically. Pulmonary hypertension which is probably related to obstructive sleep apnea as well as diastolic  congestive heart failure.  Obstructive sleep apnea has been managed by pulmonary.  Apparently she has to wait 3 to 4 months to get equipment hopefully at that time situation will improve she is wearing oxygen at night which should be beneficial.  I will try to do my best to manage her diastolic congestive heart failure best we can we did discuss fluid restriction we did discuss avoidance of salty foods and discussed proper diet. Essential hypertension blood pressure well controlled continue present management but anticipate need to increase dose of diuretics Obesity obviously huge problem she can benefit from gastric bypass surgery for weight reduction surgery however she needs to lose 40 pounds before the surgery is contemplated and wound likely to be healed.  Overall very difficult and complex duration   Medication Adjustments/Labs and Tests Ordered: Current medicines are reviewed at length with the patient today.  Concerns regarding medicines are outlined above.  No orders of the defined types were placed in this encounter.  Medication changes: No orders of the defined types were placed in this encounter.   Signed, Park Liter, MD, [facility] 02/13/2022 2:37 PM    [place]

## 2022-02-14 ENCOUNTER — Encounter: Payer: Self-pay | Admitting: Family

## 2022-02-14 ENCOUNTER — Ambulatory Visit (INDEPENDENT_AMBULATORY_CARE_PROVIDER_SITE_OTHER): Payer: Commercial Managed Care - HMO | Admitting: Family

## 2022-02-14 DIAGNOSIS — I87331 Chronic venous hypertension (idiopathic) with ulcer and inflammation of right lower extremity: Secondary | ICD-10-CM

## 2022-02-14 DIAGNOSIS — I890 Lymphedema, not elsewhere classified: Secondary | ICD-10-CM

## 2022-02-14 DIAGNOSIS — S8012XA Contusion of left lower leg, initial encounter: Secondary | ICD-10-CM

## 2022-02-14 LAB — BASIC METABOLIC PANEL
BUN/Creatinine Ratio: 27 — ABNORMAL HIGH (ref 9–23)
BUN: 21 mg/dL (ref 6–24)
CO2: 26 mmol/L (ref 20–29)
Calcium: 9.2 mg/dL (ref 8.7–10.2)
Chloride: 97 mmol/L (ref 96–106)
Creatinine, Ser: 0.79 mg/dL (ref 0.57–1.00)
Glucose: 103 mg/dL — ABNORMAL HIGH (ref 70–99)
Potassium: 4.1 mmol/L (ref 3.5–5.2)
Sodium: 138 mmol/L (ref 134–144)
eGFR: 87 mL/min/{1.73_m2} (ref >59)

## 2022-02-14 LAB — PRO B NATRIURETIC PEPTIDE: NT-Pro BNP: 124 pg/mL (ref 0–287)

## 2022-02-14 MED ORDER — OXYCODONE-ACETAMINOPHEN 5-325 MG PO TABS: 1.0000 | ORAL_TABLET | Freq: Four times a day (QID) | ORAL | 0 refills | Status: DC | PRN

## 2022-02-14 NOTE — Progress Notes (Signed)
Post-Op Visit Note   Patient: Christina Dominguez           Date of Birth: 1965-12-22           MRN: [id number] Visit Date: 02/14/2022 PCP: Fredirick Lathe, PA-C  Chief Complaint: No chief complaint on file.   HPI:  HPI The patient is a 56 year old woman seen status post excisional debridement of hematoma to the left lower extremity.  She has been undergoing serial compression wraps.  She has been slow to heal. Ortho Exam On examination of the left lower extremity she has significant edema especially below the level of the compression wrap.  There is fibrinous exudative tissue and half the wound bed this was debrided with gauze back to viable bleeding granulation tissue.  The wound is 3 x 6 cm this does not probe there is no undermining there is no surrounding erythema or sign of infection  Visit Diagnoses: No diagnosis found.  Plan: kerecis powder applied today to scaffolding dressing., unna and dynaflex compression applied as well.  Plan to follow-up in 1 week for reevaluation  Follow-Up Instructions: No follow-ups on file.   Imaging: No results found.  Orders:  No orders of the defined types were placed in this encounter.  No orders of the defined types were placed in this encounter.    PMFS History: Patient Active Problem List   Diagnosis Date Noted   Hematoma of left lower leg    Wheezing 11/23/2021   Snoring 11/23/2021   Other secondary pulmonary hypertension ([place]) 11/23/2021   Hyponatremia 10/11/2021   Dyspnea on exertion    Dyslipidemia 10/04/2021   Mediastinal mass 10/04/2021   Obstructive sleep apnea 08/25/2021   RUQ pain 08/19/2021   Hepatic steatosis 08/19/2021   Hepatomegaly 08/19/2021   Aortic atherosclerosis ([place]) 08/19/2021   Diverticulosis 08/19/2021   Anxiety 08/02/2021   Bipolar 1 disorder ([place]) 08/02/2021   CHF (congestive heart failure) ([place]) 08/02/2021   Depression 08/02/2021   Joint pain 08/02/2021   Screening for malignant neoplasm of  colon 08/02/2021   Prediabetes 12/22/2019   Vitamin D insufficiency 12/22/2019   Class 3 severe obesity with serious comorbidity and body mass index (BMI) greater than or equal to 70 in adult ([place]) 12/21/2019   Essential hypertension 04/30/2019   Mild intermittent asthma without complication 09/73/5329   Past Medical History:  Diagnosis Date   Anxiety    Aortic atherosclerosis ([place]) 08/19/2021   Arthritis    Asthma    Bipolar 1 disorder (HCC)    Chronic diastolic CHF (congestive heart failure) (HCC)    Class 3 severe obesity with serious comorbidity and body mass index (BMI) greater than or equal to 70 in adult ([place]) 12/21/2019   Depression    Diastolic dysfunction 92/42/6834   Diverticulosis 08/19/2021   Edema of both lower extremities    Hepatic steatosis 08/19/2021   Hepatomegaly 08/19/2021   High blood pressure    Hyperlipidemia    Joint pain    Morbid obesity ([place])    Pre-diabetes     Family History  Problem Relation Age of Onset   Heart Problems Mother        Broken heart syndrome   Hypertension Mother    Hypercholesterolemia Mother    Bone cancer Father    Testicular cancer Father    Heart attack Sister    Diabetes Brother    Colon cancer Maternal Grandfather    Dementia Paternal Grandmother     Past Surgical History:  Procedure Laterality Date   APPLICATION OF WOUND VAC Left 12/20/2021   Procedure: APPLICATION OF WOUND VAC;  Surgeon: [name], MD;  Location: [place];  Service: Orthopedics;  Laterality: Left;   I & D EXTREMITY Left 12/20/2021   Procedure: EXCISIONAL DEBRIDEMENT HEMATOMA LEFT LEG;  Surgeon: [name], MD;  Location: [place];  Service: Orthopedics;  Laterality: Left;   NO PAST SURGERIES     RIGHT/LEFT HEART CATH AND CORONARY ANGIOGRAPHY N/A 10/06/2021   Procedure: RIGHT/LEFT HEART CATH AND CORONARY ANGIOGRAPHY;  Surgeon: Jettie Booze, MD;  Location: [place] CV LAB;  Service: Cardiovascular;  Laterality: N/A;   Social History    Occupational History   Occupation: Medical illustrator, work from home  Tobacco Use   Smoking status: Never    Passive exposure: Never   Smokeless tobacco: Never  Vaping Use   Vaping Use: Never used  Substance and Sexual Activity   Alcohol use: Not Currently    Comment: Maybe 2 glasses of wine a month   Drug use: Never   Sexual activity: Not on file

## 2022-02-15 ENCOUNTER — Telehealth: Payer: Self-pay | Admitting: Cardiology

## 2022-02-15 ENCOUNTER — Telehealth: Payer: Self-pay

## 2022-02-15 NOTE — Telephone Encounter (Signed)
Results reviewed with pt as per Dr. Krasowski's note.  Pt verbalized understanding and had no additional questions. Routed to PCP  

## 2022-02-15 NOTE — Telephone Encounter (Signed)
Patient called for results to her lab work.

## 2022-02-16 ENCOUNTER — Ambulatory Visit (INDEPENDENT_AMBULATORY_CARE_PROVIDER_SITE_OTHER): Payer: Commercial Managed Care - HMO | Admitting: Physician Assistant

## 2022-02-16 ENCOUNTER — Encounter: Payer: Self-pay | Admitting: Physician Assistant

## 2022-02-16 VITALS — BP 134/76 | HR 92 | Temp 98.1°F | Ht 60.0 in | Wt 383.2 lb

## 2022-02-16 DIAGNOSIS — R197 Diarrhea, unspecified: Secondary | ICD-10-CM | POA: Diagnosis not present

## 2022-02-16 DIAGNOSIS — R5383 Other fatigue: Secondary | ICD-10-CM | POA: Diagnosis not present

## 2022-02-16 DIAGNOSIS — R194 Change in bowel habit: Secondary | ICD-10-CM

## 2022-02-16 LAB — CBC WITH DIFFERENTIAL/PLATELET
Basophils Absolute: 0 10*3/uL (ref 0.0–0.1)
Basophils Relative: 0.4 % (ref 0.0–3.0)
Eosinophils Absolute: 0.2 10*3/uL (ref 0.0–0.7)
Eosinophils Relative: 2.8 % (ref 0.0–5.0)
HCT: 34.6 % — ABNORMAL LOW (ref 36.0–46.0)
Hemoglobin: 11.5 g/dL — ABNORMAL LOW (ref 12.0–15.0)
Lymphocytes Relative: 13.8 % (ref 12.0–46.0)
Lymphs Abs: 1.1 10*3/uL (ref 0.7–4.0)
MCHC: 33.2 g/dL (ref 30.0–36.0)
MCV: 91.9 fl (ref 78.0–100.0)
Monocytes Absolute: 0.4 10*3/uL (ref 0.1–1.0)
Monocytes Relative: 5.8 % (ref 3.0–12.0)
Neutro Abs: 6 10*3/uL (ref 1.4–7.7)
Neutrophils Relative %: 77.2 % — ABNORMAL HIGH (ref 43.0–77.0)
Platelets: 322 10*3/uL (ref 150.0–400.0)
RBC: 3.76 Mil/uL — ABNORMAL LOW (ref 3.87–5.11)
RDW: 15.3 % (ref 11.5–15.5)
WBC: 7.8 10*3/uL (ref 4.0–10.5)

## 2022-02-16 LAB — COMPREHENSIVE METABOLIC PANEL
ALT: 17 U/L (ref 0–35)
AST: 20 U/L (ref 0–37)
Albumin: 4.1 g/dL (ref 3.5–5.2)
Alkaline Phosphatase: 99 U/L (ref 39–117)
BUN: 23 mg/dL (ref 6–23)
CO2: 34 mEq/L — ABNORMAL HIGH (ref 19–32)
Calcium: 9 mg/dL (ref 8.4–10.5)
Chloride: 98 mEq/L (ref 96–112)
Creatinine, Ser: 0.91 mg/dL (ref 0.40–1.20)
GFR: 70.28 mL/min (ref >60.00)
Glucose, Bld: 96 mg/dL (ref 70–99)
Potassium: 3.7 mEq/L (ref 3.5–5.1)
Sodium: 138 mEq/L (ref 135–145)
Total Bilirubin: 0.4 mg/dL (ref 0.2–1.2)
Total Protein: 7.7 g/dL (ref 6.0–8.3)

## 2022-02-16 LAB — C-REACTIVE PROTEIN: CRP: 2.4 mg/dL (ref 0.5–20.0)

## 2022-02-16 NOTE — Patient Instructions (Signed)
Labs today - will let you know of results May end up needing GI referral Cont Imodium and Dicyclomine for intermittent flare-ups  Ask bariatric team about SAXENDA  Take care,  Marybell Robards, PA-C

## 2022-02-16 NOTE — Progress Notes (Signed)
Subjective:    Patient ID: Christina Dominguez, female    DOB: 1965/12/03, 56 y.o.   MRN: [id number]  Chief Complaint  Patient presents with   Follow-up    Pt was seen at Urgent care and states possible IBS; two weeks ago diarrhea for 5 days; did BRAT diet and went away, went back to normal diet and diarrhea came back but worse.    HPI Patient is in today for possible IBS-D. Not currently in a flare-up Virtual visit 02/09/22 - Two weeks ago, diarrhea x 5 days; BRAT diet, went away for a week, then had another 3 days of explosive diarrhea - several clothing / sheets changes Rx for dicyclomine given for her, only taken it as needed now Has had intermittent hx of episodes of diarrhea in the past, but never like most recent High protein, low carb diet through bariatric program right now Last bowel movement this morning, normal consistency and color She has been noticing more fatigue lately   Cologuard - negative 10/02/21  Interim history: Mild OSA diagnosis - should receive CPAP machine in about 4 months  Hematoma LLE wound - Dr. Audrie Lia office, will have lymphedema machine soon  Past Medical History:  Diagnosis Date   Anxiety    Aortic atherosclerosis (HCC) 08/19/2021   Arthritis    Asthma    Bipolar 1 disorder (HCC)    Chronic diastolic CHF (congestive heart failure) (HCC)    Class 3 severe obesity with serious comorbidity and body mass index (BMI) greater than or equal to 70 in adult [facility]) 12/21/2019   Depression    Diastolic dysfunction 08/02/2021   Diverticulosis 08/19/2021   Edema of both lower extremities    Hepatic steatosis 08/19/2021   Hepatomegaly 08/19/2021   High blood pressure    Hyperlipidemia    Joint pain    Morbid obesity (HCC)    Pre-diabetes     Past Surgical History:  Procedure Laterality Date   APPLICATION OF WOUND VAC Left 12/20/2021   Procedure: APPLICATION OF WOUND VAC;  Surgeon: Nadara Mustard, MD;  Location: [facility];  Service: Orthopedics;   Laterality: Left;   I & D EXTREMITY Left 12/20/2021   Procedure: EXCISIONAL DEBRIDEMENT HEMATOMA LEFT LEG;  Surgeon: Nadara Mustard, MD;  Location: [facility] OR;  Service: Orthopedics;  Laterality: Left;   NO PAST SURGERIES     RIGHT/LEFT HEART CATH AND CORONARY ANGIOGRAPHY N/A 10/06/2021   Procedure: RIGHT/LEFT HEART CATH AND CORONARY ANGIOGRAPHY;  Surgeon: Corky Crafts, MD;  Location: [facility] INVASIVE [facility] LAB;  Service: Cardiovascular;  Laterality: N/A;    Family History  Problem Relation Age of Onset   Heart Problems Mother        Broken heart syndrome   Hypertension Mother    Hypercholesterolemia Mother    Bone cancer Father    Testicular cancer Father    Heart attack Sister    Diabetes Brother    Colon cancer Maternal Grandfather    Dementia Paternal Grandmother     Social History   Tobacco Use   Smoking status: Never    Passive exposure: Never   Smokeless tobacco: Never  Vaping Use   Vaping Use: Never used  Substance Use Topics   Alcohol use: Not Currently    Comment: Maybe 2 glasses of wine a month   Drug use: Never     Allergies  Allergen Reactions   Aspirin Nausea And Vomiting   Eggs Or Egg-Derived Products Swelling   Influenza  Vaccines Swelling    Review of Systems NEGATIVE UNLESS OTHERWISE INDICATED IN HPI      Objective:     BP 134/76 (BP Location: Right Arm)   Pulse 92   Temp 98.1 F (36.7 C) (Temporal)   Ht 5' (1.524 m)   Wt (!) 383 lb 3.2 oz (173.8 kg)   SpO2 97%   BMI 74.84 kg/m   Wt Readings from Last 3 Encounters:  02/16/22 (!) 383 lb 3.2 oz (173.8 kg)  02/13/22 (!) 381 lb 1.9 oz (172.9 kg)  01/30/22 (!) 388 lb (176 kg)    BP Readings from Last 3 Encounters:  02/16/22 134/76  02/13/22 132/78  01/31/22 (!) 146/91     Physical Exam Constitutional:      Appearance: Normal appearance. She is morbidly obese.  Eyes:     Extraocular Movements: Extraocular movements intact.     Conjunctiva/sclera: Conjunctivae normal.     Pupils: Pupils  are equal, round, and reactive to light.  Cardiovascular:     Rate and Rhythm: Normal rate and regular rhythm.     Pulses: Normal pulses.     Heart sounds: Normal heart sounds.  Pulmonary:     Effort: Pulmonary effort is normal.     Breath sounds: Normal breath sounds.  Abdominal:     General: Abdomen is flat. Bowel sounds are normal.     Palpations: Abdomen is soft.     Tenderness: There is no abdominal tenderness.  Neurological:     Mental Status: She is alert.  Psychiatric:        Mood and Affect: Mood normal.        Behavior: Behavior normal.        Assessment & Plan:   Problem List Items Addressed This Visit   None Visit Diagnoses     Recent change in frequency of bowel movements    -  Primary   Relevant Orders   CBC with Differential/Platelet (Completed)   Comprehensive metabolic panel (Completed)   Iron, TIBC and Ferritin Panel (Completed)   C-reactive protein (Completed)   Diarrhea, unspecified type       Relevant Orders   CBC with Differential/Platelet (Completed)   Comprehensive metabolic panel (Completed)   Iron, TIBC and Ferritin Panel (Completed)   C-reactive protein (Completed)   Other fatigue       Relevant Orders   CBC with Differential/Platelet (Completed)   Comprehensive metabolic panel (Completed)   Iron, TIBC and Ferritin Panel (Completed)   C-reactive protein (Completed)      1. Recent change in frequency of bowel movements 2. Diarrhea, unspecified type 3. Other fatigue Symptoms have not been as much of an issue this week Update labs today - will let you know of results May end up needing GI referral Cont Imodium and Dicyclomine for intermittent flare-ups Pt to keep me updated    This note was prepared with assistance of Dragon voice recognition software. Occasional wrong-word or sound-a-like substitutions may have occurred due to the inherent limitations of voice recognition software.  Time Spent: 23 minutes of total time was spent on  the date of the encounter performing the following actions: chart review prior to seeing the patient, obtaining history, performing a medically necessary exam, counseling on the treatment plan, placing orders, and documenting in our EHR.     Jese Comella M Ayman Brull, PA-C

## 2022-02-17 LAB — IRON,TIBC AND FERRITIN PANEL
%SAT: 15 % (calc) — ABNORMAL LOW (ref 16–45)
Ferritin: 97 ng/mL (ref 16–232)
Iron: 46 ug/dL (ref 45–160)
TIBC: 302 mcg/dL (calc) (ref 250–450)

## 2022-02-19 ENCOUNTER — Telehealth: Payer: Self-pay | Admitting: Orthopedic Surgery

## 2022-02-19 NOTE — Telephone Encounter (Signed)
Christina Dominguez with [employer] called to see if we have signed and faxed back patient's new Rx for a compression device. It was sent to our office on 02/14/22.

## 2022-02-19 NOTE — Telephone Encounter (Signed)
Dr. Sharol Given was out of the office last week, I will look into his folder to see if in there to sign. I haven't seen anything come in last week on this.

## 2022-02-20 LAB — PULMONARY FUNCTION TEST
DL/VA % pred: 79 %
DL/VA: 3.49 ml/min/mmHg/L
DLCO cor % pred: 73 %
DLCO cor: 13.11 ml/min/mmHg
DLCO unc % pred: 70 %
DLCO unc: 12.55 ml/min/mmHg
FEF 25-75 Post: 0.94 L/sec
FEF 25-75 Pre: 0.71 L/sec
FEF2575-%Change-Post: 32 %
FEF2575-%Pred-Post: 40 %
FEF2575-%Pred-Pre: 30 %
FEV1-%Change-Post: 7 %
FEV1-%Pred-Post: 44 %
FEV1-%Pred-Pre: 41 %
FEV1-Post: 1.02 L
FEV1-Pre: 0.95 L
FEV1FVC-%Change-Post: 9 %
FEV1FVC-%Pred-Pre: 94 %
FEV6-%Change-Post: 0 %
FEV6-%Pred-Post: 43 %
FEV6-%Pred-Pre: 43 %
FEV6-Post: 1.24 L
FEV6-Pre: 1.25 L
FEV6FVC-%Change-Post: 1 %
FEV6FVC-%Pred-Post: 103 %
FEV6FVC-%Pred-Pre: 101 %
FVC-%Change-Post: -1 %
FVC-%Pred-Post: 42 %
FVC-%Pred-Pre: 43 %
FVC-Post: 1.24 L
FVC-Pre: 1.26 L
Post FEV1/FVC ratio: 82 %
Post FEV6/FVC ratio: 100 %
Pre FEV1/FVC ratio: 75 %
Pre FEV6/FVC Ratio: 99 %

## 2022-02-20 NOTE — Telephone Encounter (Signed)
I have paperwork. I will get him to sign this today and we will fax back.

## 2022-02-21 ENCOUNTER — Ambulatory Visit (INDEPENDENT_AMBULATORY_CARE_PROVIDER_SITE_OTHER): Payer: Commercial Managed Care - HMO | Admitting: Family

## 2022-02-21 DIAGNOSIS — I890 Lymphedema, not elsewhere classified: Secondary | ICD-10-CM | POA: Diagnosis not present

## 2022-02-21 DIAGNOSIS — I87331 Chronic venous hypertension (idiopathic) with ulcer and inflammation of right lower extremity: Secondary | ICD-10-CM

## 2022-02-21 DIAGNOSIS — S8012XA Contusion of left lower leg, initial encounter: Secondary | ICD-10-CM | POA: Diagnosis not present

## 2022-02-23 ENCOUNTER — Encounter: Payer: Self-pay | Admitting: Family

## 2022-02-23 NOTE — Progress Notes (Signed)
Post-Op Visit Note   Patient: Christina Dominguez           Date of Birth: 1966-04-26           MRN: [id number] Visit Date: 02/21/2022 PCP: Fredirick Lathe, PA-C  Chief Complaint: No chief complaint on file.   HPI:  HPI The patient is a 56 year old woman who is seen status post excisional debridement of hematoma left lower leg and she is currently undergoing serial compression wraps most recently using As well as Dynaflex she has had Kerecis powder applied in the office for the last 1 week and her compression wrap  We are currently awaiting approval of some lymphedema pumps. Despite compression wraps she has failed to have improvement in her edema due to an increase in truncal lymphedema without  clinical improvement in her lower extremity edema or wound Ortho Exam On examination of the left lower extremity with significant edema. there is improved granulation in the wound bed the wound is decreased in size.  There is circumferential epithelialization.  There is much less depth. moderate bleeding no odor no maceration no purulence  Visit Diagnoses:  1. Hematoma of left lower leg   2. Lymphedema   3. Chronic venous hypertension (idiopathic) with ulcer and inflammation of right lower extremity ([place])     Plan: Today we will reapply a 4 x 4 ABD pad as well as unna and dynaflex compression.awaiting lymphedema pumps.   Follow-Up Instructions: Return in about 1 week (around 02/28/2022).   Imaging: No results found.  Orders:  No orders of the defined types were placed in this encounter.  No orders of the defined types were placed in this encounter.    PMFS History: Patient Active Problem List   Diagnosis Date Noted   Hematoma of left lower leg    Wheezing 11/23/2021   Snoring 11/23/2021   Other secondary pulmonary hypertension ([place]) 11/23/2021   Hyponatremia 10/11/2021   Dyspnea on exertion    Dyslipidemia 10/04/2021   Mediastinal mass 10/04/2021   Obstructive sleep apnea  08/25/2021   RUQ pain 08/19/2021   Hepatic steatosis 08/19/2021   Hepatomegaly 08/19/2021   Aortic atherosclerosis ([place]) 08/19/2021   Diverticulosis 08/19/2021   Anxiety 08/02/2021   Bipolar 1 disorder ([place]) 08/02/2021   CHF (congestive heart failure) ([place]) 08/02/2021   Depression 08/02/2021   Joint pain 08/02/2021   Screening for malignant neoplasm of colon 08/02/2021   Prediabetes 12/22/2019   Vitamin D insufficiency 12/22/2019   Class 3 severe obesity with serious comorbidity and body mass index (BMI) greater than or equal to 70 in adult ([place]) 12/21/2019   Essential hypertension 04/30/2019   Mild intermittent asthma without complication 53/64/6803   Past Medical History:  Diagnosis Date   Anxiety    Aortic atherosclerosis ([place]) 08/19/2021   Arthritis    Asthma    Bipolar 1 disorder (HCC)    Chronic diastolic CHF (congestive heart failure) (HCC)    Class 3 severe obesity with serious comorbidity and body mass index (BMI) greater than or equal to 70 in adult ([place]) 12/21/2019   Depression    Diastolic dysfunction 21/22/4825   Diverticulosis 08/19/2021   Edema of both lower extremities    Hepatic steatosis 08/19/2021   Hepatomegaly 08/19/2021   High blood pressure    Hyperlipidemia    Joint pain    Morbid obesity ([place])    Pre-diabetes     Family History  Problem Relation Age of Onset   Heart Problems Mother  Broken heart syndrome   Hypertension Mother    Hypercholesterolemia Mother    Bone cancer Father    Testicular cancer Father    Heart attack Sister    Diabetes Brother    Colon cancer Maternal Grandfather    Dementia Paternal Grandmother     Past Surgical History:  Procedure Laterality Date   APPLICATION OF WOUND VAC Left 12/20/2021   Procedure: APPLICATION OF WOUND VAC;  Surgeon: [name], MD;  Location: [facility];  Service: Orthopedics;  Laterality: Left;   I & D EXTREMITY Left 12/20/2021   Procedure: EXCISIONAL DEBRIDEMENT HEMATOMA LEFT LEG;   Surgeon: [name], MD;  Location: [facility];  Service: Orthopedics;  Laterality: Left;   NO PAST SURGERIES     RIGHT/LEFT HEART CATH AND CORONARY ANGIOGRAPHY N/A 10/06/2021   Procedure: RIGHT/LEFT HEART CATH AND CORONARY ANGIOGRAPHY;  Surgeon: Jettie Booze, MD;  Location: [facility];  Service: Cardiovascular;  Laterality: N/A;   Social History   Occupational History   Occupation: Medical illustrator, work from home  Tobacco Use   Smoking status: Never    Passive exposure: Never   Smokeless tobacco: Never  Vaping Use   Vaping Use: Never used  Substance and Sexual Activity   Alcohol use: Not Currently    Comment: Maybe 2 glasses of wine a month   Drug use: Never   Sexual activity: Not on file

## 2022-02-27 ENCOUNTER — Encounter: Payer: Managed Care, Other (non HMO) | Admitting: Physician Assistant

## 2022-02-28 ENCOUNTER — Encounter: Payer: Self-pay | Admitting: Family

## 2022-02-28 ENCOUNTER — Ambulatory Visit (INDEPENDENT_AMBULATORY_CARE_PROVIDER_SITE_OTHER): Payer: Commercial Managed Care - HMO | Admitting: Family

## 2022-02-28 ENCOUNTER — Encounter: Payer: Commercial Managed Care - HMO | Admitting: Family

## 2022-02-28 DIAGNOSIS — S8012XA Contusion of left lower leg, initial encounter: Secondary | ICD-10-CM

## 2022-02-28 DIAGNOSIS — I890 Lymphedema, not elsewhere classified: Secondary | ICD-10-CM

## 2022-03-02 NOTE — Progress Notes (Signed)
Post-Op Visit Note   Patient: Christina Dominguez           Date of Birth: 01-Feb-1966           MRN: [id number] Visit Date: 02/28/2022 PCP: Fredirick Lathe, PA-C  Chief Complaint:  Chief Complaint  Patient presents with   Left Leg - Routine Post Op    12/20/21 EXCISIONAL DEBRIDEMENT HEMATOMA LEFT LEG     HPI:  HPI The patient is a 56 year old woman who is seen status post excisional debridement of hematoma left lower leg and she is currently undergoing serial compression wraps with unna and dynaflex. Had kerecis powder placement 2 weeks ago in office.  We are currently awaiting approval of some lymphedema pumps.  She is quite pleased with the improvement over the last 2 weeks and her wound  States her dose of Lasix has increased she feels this may be improving the edema   Ortho Exam On examination of the left lower extremity with improvement in edema.  There is no weeping no erythema good control of the lower leg edema with compression garment significant edema to the thighs.  The wound bed continues to improve there is circumferential epithelialization with granulation in the wound bed.  Please see attached photo.  There is no active drainage or odor   Visit Diagnoses:  1. Hematoma of left lower leg   2. Lymphedema of both lower extremities     Plan: Today we will reapply a 4 x 4 ABD pad as well as unna and dynaflex compression. awaiting lymphedema pumps.   Follow-Up Instructions: No follow-ups on file.   Imaging: No results found.  Orders:  No orders of the defined types were placed in this encounter.  No orders of the defined types were placed in this encounter.    PMFS History: Patient Active Problem List   Diagnosis Date Noted   Hematoma of left lower leg    Wheezing 11/23/2021   Snoring 11/23/2021   Other secondary pulmonary hypertension ([place]) 11/23/2021   Hyponatremia 10/11/2021   Dyspnea on exertion    Dyslipidemia 10/04/2021   Mediastinal mass  10/04/2021   Obstructive sleep apnea 08/25/2021   RUQ pain 08/19/2021   Hepatic steatosis 08/19/2021   Hepatomegaly 08/19/2021   Aortic atherosclerosis ([place]) 08/19/2021   Diverticulosis 08/19/2021   Anxiety 08/02/2021   Bipolar 1 disorder ([place]) 08/02/2021   CHF (congestive heart failure) ([place]) 08/02/2021   Depression 08/02/2021   Joint pain 08/02/2021   Screening for malignant neoplasm of colon 08/02/2021   Prediabetes 12/22/2019   Vitamin D insufficiency 12/22/2019   Class 3 severe obesity with serious comorbidity and body mass index (BMI) greater than or equal to 70 in adult ([place]) 12/21/2019   Essential hypertension 04/30/2019   Mild intermittent asthma without complication 28/31/5176   Past Medical History:  Diagnosis Date   Anxiety    Aortic atherosclerosis ([place]) 08/19/2021   Arthritis    Asthma    Bipolar 1 disorder (HCC)    Chronic diastolic CHF (congestive heart failure) (HCC)    Class 3 severe obesity with serious comorbidity and body mass index (BMI) greater than or equal to 70 in adult ([place]) 12/21/2019   Depression    Diastolic dysfunction 16/02/3709   Diverticulosis 08/19/2021   Edema of both lower extremities    Hepatic steatosis 08/19/2021   Hepatomegaly 08/19/2021   High blood pressure    Hyperlipidemia    Joint pain    Morbid obesity ([place])  Pre-diabetes     Family History  Problem Relation Age of Onset   Heart Problems Mother        Broken heart syndrome   Hypertension Mother    Hypercholesterolemia Mother    Bone cancer Father    Testicular cancer Father    Heart attack Sister    Diabetes Brother    Colon cancer Maternal Grandfather    Dementia Paternal Grandmother     Past Surgical History:  Procedure Laterality Date   APPLICATION OF WOUND VAC Left 12/20/2021   Procedure: APPLICATION OF WOUND VAC;  Surgeon: [name], MD;  Location: [facility];  Service: Orthopedics;  Laterality: Left;   I & D EXTREMITY Left 12/20/2021   Procedure: EXCISIONAL  DEBRIDEMENT HEMATOMA LEFT LEG;  Surgeon: [name], MD;  Location: [facility];  Service: Orthopedics;  Laterality: Left;   NO PAST SURGERIES     RIGHT/LEFT HEART CATH AND CORONARY ANGIOGRAPHY N/A 10/06/2021   Procedure: RIGHT/LEFT HEART CATH AND CORONARY ANGIOGRAPHY;  Surgeon: Jettie Booze, MD;  Location: [facility];  Service: Cardiovascular;  Laterality: N/A;   Social History   Occupational History   Occupation: Medical illustrator, work from home  Tobacco Use   Smoking status: Never    Passive exposure: Never   Smokeless tobacco: Never  Vaping Use   Vaping Use: Never used  Substance and Sexual Activity   Alcohol use: Not Currently    Comment: Maybe 2 glasses of wine a month   Drug use: Never   Sexual activity: Not on file

## 2022-03-07 ENCOUNTER — Ambulatory Visit (INDEPENDENT_AMBULATORY_CARE_PROVIDER_SITE_OTHER): Payer: Commercial Managed Care - HMO | Admitting: Family

## 2022-03-07 ENCOUNTER — Encounter: Payer: Self-pay | Admitting: Family

## 2022-03-07 DIAGNOSIS — S8012XA Contusion of left lower leg, initial encounter: Secondary | ICD-10-CM

## 2022-03-07 DIAGNOSIS — I890 Lymphedema, not elsewhere classified: Secondary | ICD-10-CM

## 2022-03-07 NOTE — Progress Notes (Signed)
Post-Op Visit Note   Patient: Christina Dominguez           Date of Birth: 1966-04-01           MRN: [id number] Visit Date: 03/07/2022 PCP: Fredirick Lathe, PA-C  Chief Complaint:  Chief Complaint  Patient presents with   Left Leg - Routine Post Op    12/20/21 EXCISIONAL DEBRIDEMENT HEMATOMA LEFT LEG    HPI:  HPI The patient is a 56 year old woman who presents in follow-up status post excisional debridement hematoma left lower leg on May 17.  Has been having serial compression wraps with Unna and Dynaflex.  we currently awaiting approval of lymphedema pumps.  Despite compression wrapping she has failed to have improvement in her edema due to an increase in truncal lymphedema without clinical improvement in her lower extremity edema or wound.  Treatment has been ongoing now for 10 weeks.  Her edema is persistent despite exercise and elevation  [id number] " entre plus" was demonstrated on July 11 and failed due to an increase in truncal lymphedema with no clinical improvement.  4 weeks or more conservative therapies with compression exercise and elevation  Lymphedema to bilateral lower extremities.  Ortho Exam On examination of the left lower extremity the wound is much improved is now 65 mm x 15 mm filled in with 100% granulation tissue there is no surrounding maceration no active drainage no erythema no warmth there is good control of the edema from her compression garment  Visit Diagnoses: No diagnosis found.  Plan: We will reapply the Dynaflex compression follow-up in 1 week  Follow-Up Instructions: Return in about 2 weeks (around 03/21/2022).   Imaging: No results found.  Orders:  No orders of the defined types were placed in this encounter.  No orders of the defined types were placed in this encounter.    PMFS History: Patient Active Problem List   Diagnosis Date Noted   Hematoma of left lower leg    Wheezing 11/23/2021   Snoring 11/23/2021   Other secondary  pulmonary hypertension ([place]) 11/23/2021   Hyponatremia 10/11/2021   Dyspnea on exertion    Dyslipidemia 10/04/2021   Mediastinal mass 10/04/2021   Obstructive sleep apnea 08/25/2021   RUQ pain 08/19/2021   Hepatic steatosis 08/19/2021   Hepatomegaly 08/19/2021   Aortic atherosclerosis ([place]) 08/19/2021   Diverticulosis 08/19/2021   Anxiety 08/02/2021   Bipolar 1 disorder ([place]) 08/02/2021   CHF (congestive heart failure) ([place]) 08/02/2021   Depression 08/02/2021   Joint pain 08/02/2021   Screening for malignant neoplasm of colon 08/02/2021   Prediabetes 12/22/2019   Vitamin D insufficiency 12/22/2019   Class 3 severe obesity with serious comorbidity and body mass index (BMI) greater than or equal to 70 in adult [facility]) 12/21/2019   Essential hypertension 04/30/2019   Mild intermittent asthma without complication 81/15/7262   Past Medical History:  Diagnosis Date   Anxiety    Aortic atherosclerosis ([place]) 08/19/2021   Arthritis    Asthma    Bipolar 1 disorder (HCC)    Chronic diastolic CHF (congestive heart failure) (HCC)    Class 3 severe obesity with serious comorbidity and body mass index (BMI) greater than or equal to 70 in adult ([place]) 12/21/2019   Depression    Diastolic dysfunction 03/55/9741   Diverticulosis 08/19/2021   Edema of both lower extremities    Hepatic steatosis 08/19/2021   Hepatomegaly 08/19/2021   High blood pressure    Hyperlipidemia    Joint pain  Morbid obesity ([place])    Pre-diabetes     Family History  Problem Relation Age of Onset   Heart Problems Mother        Broken heart syndrome   Hypertension Mother    Hypercholesterolemia Mother    Bone cancer Father    Testicular cancer Father    Heart attack Sister    Diabetes Brother    Colon cancer Maternal Grandfather    Dementia Paternal Grandmother     Past Surgical History:  Procedure Laterality Date   APPLICATION OF WOUND VAC Left 12/20/2021   Procedure: APPLICATION OF WOUND VAC;  Surgeon:  [name], MD;  Location: [place];  Service: Orthopedics;  Laterality: Left;   I & D EXTREMITY Left 12/20/2021   Procedure: EXCISIONAL DEBRIDEMENT HEMATOMA LEFT LEG;  Surgeon: [name], MD;  Location: [facility];  Service: Orthopedics;  Laterality: Left;   NO PAST SURGERIES     RIGHT/LEFT HEART CATH AND CORONARY ANGIOGRAPHY N/A 10/06/2021   Procedure: RIGHT/LEFT HEART CATH AND CORONARY ANGIOGRAPHY;  Surgeon: Jettie Booze, MD;  Location: [facility];  Service: Cardiovascular;  Laterality: N/A;   Social History   Occupational History   Occupation: Medical illustrator, work from home  Tobacco Use   Smoking status: Never    Passive exposure: Never   Smokeless tobacco: Never  Vaping Use   Vaping Use: Never used  Substance and Sexual Activity   Alcohol use: Not Currently    Comment: Maybe 2 glasses of wine a month   Drug use: Never   Sexual activity: Not on file

## 2022-03-14 ENCOUNTER — Encounter (INDEPENDENT_AMBULATORY_CARE_PROVIDER_SITE_OTHER): Payer: Self-pay

## 2022-03-14 ENCOUNTER — Ambulatory Visit (INDEPENDENT_AMBULATORY_CARE_PROVIDER_SITE_OTHER): Payer: Commercial Managed Care - HMO | Admitting: Family

## 2022-03-14 DIAGNOSIS — S8012XA Contusion of left lower leg, initial encounter: Secondary | ICD-10-CM

## 2022-03-16 ENCOUNTER — Encounter: Payer: Self-pay | Admitting: Family

## 2022-03-16 NOTE — Progress Notes (Signed)
Post-Op Visit Note   Patient: Christina Dominguez           Date of Birth: 02/20/1966           MRN: [id number] Visit Date: 03/14/2022 PCP: Fredirick Lathe, PA-C  Chief Complaint:  Chief Complaint  Patient presents with   Left Leg - Routine Post Op    12/20/21 EXCISIONAL DEBRIDEMENT HEMATOMA LEFT LEG    HPI:  HPI The patient is a 56 year old woman who presents in follow-up status post excisional debridement hematoma left lower leg on May 17.  Has been having serial compression wraps with Unna and Dynaflex.  we currently awaiting approval of lymphedema pumps.  These are to be delivered this week.  Ortho Exam On examination of the left lower extremity the wound is much improved.  Please see attached image.  Great reduction in wound size.  There is no surrounding maceration no active drainage no erythema no warmth there is good control of the edema from her compression garment  Visit Diagnoses: No diagnosis found.  Plan: Placed in a compression stocking today with direct skin contact discussed padding on the outside to catch drainage.  She will follow-up in 1 week  Follow-Up Instructions: No follow-ups on file.   Imaging: No results found.  Orders:  No orders of the defined types were placed in this encounter.  No orders of the defined types were placed in this encounter.    PMFS History: Patient Active Problem List   Diagnosis Date Noted   Hematoma of left lower leg    Wheezing 11/23/2021   Snoring 11/23/2021   Other secondary pulmonary hypertension ([place]) 11/23/2021   Hyponatremia 10/11/2021   Dyspnea on exertion    Dyslipidemia 10/04/2021   Mediastinal mass 10/04/2021   Obstructive sleep apnea 08/25/2021   RUQ pain 08/19/2021   Hepatic steatosis 08/19/2021   Hepatomegaly 08/19/2021   Aortic atherosclerosis ([place]) 08/19/2021   Diverticulosis 08/19/2021   Anxiety 08/02/2021   Bipolar 1 disorder ([place]) 08/02/2021   CHF (congestive heart failure) ([place]) 08/02/2021    Depression 08/02/2021   Joint pain 08/02/2021   Screening for malignant neoplasm of colon 08/02/2021   Prediabetes 12/22/2019   Vitamin D insufficiency 12/22/2019   Class 3 severe obesity with serious comorbidity and body mass index (BMI) greater than or equal to 70 in adult ([place]) 12/21/2019   Essential hypertension 04/30/2019   Mild intermittent asthma without complication 83/41/9622   Past Medical History:  Diagnosis Date   Anxiety    Aortic atherosclerosis ([place]) 08/19/2021   Arthritis    Asthma    Bipolar 1 disorder (HCC)    Chronic diastolic CHF (congestive heart failure) (HCC)    Class 3 severe obesity with serious comorbidity and body mass index (BMI) greater than or equal to 70 in adult ([place]) 12/21/2019   Depression    Diastolic dysfunction 29/79/8921   Diverticulosis 08/19/2021   Edema of both lower extremities    Hepatic steatosis 08/19/2021   Hepatomegaly 08/19/2021   High blood pressure    Hyperlipidemia    Joint pain    Morbid obesity ([place])    Pre-diabetes     Family History  Problem Relation Age of Onset   Heart Problems Mother        Broken heart syndrome   Hypertension Mother    Hypercholesterolemia Mother    Bone cancer Father    Testicular cancer Father    Heart attack Sister    Diabetes Brother  Colon cancer Maternal Grandfather    Dementia Paternal Grandmother     Past Surgical History:  Procedure Laterality Date   APPLICATION OF WOUND VAC Left 12/20/2021   Procedure: APPLICATION OF WOUND VAC;  Surgeon: [name], MD;  Location: [facility];  Service: Orthopedics;  Laterality: Left;   I & D EXTREMITY Left 12/20/2021   Procedure: EXCISIONAL DEBRIDEMENT HEMATOMA LEFT LEG;  Surgeon: [name], MD;  Location: [facility];  Service: Orthopedics;  Laterality: Left;   NO PAST SURGERIES     RIGHT/LEFT HEART CATH AND CORONARY ANGIOGRAPHY N/A 10/06/2021   Procedure: RIGHT/LEFT HEART CATH AND CORONARY ANGIOGRAPHY;  Surgeon: Jettie Booze, MD;   Location: [facility];  Service: Cardiovascular;  Laterality: N/A;   Social History   Occupational History   Occupation: Medical illustrator, work from home  Tobacco Use   Smoking status: Never    Passive exposure: Never   Smokeless tobacco: Never  Vaping Use   Vaping Use: Never used  Substance and Sexual Activity   Alcohol use: Not Currently    Comment: Maybe 2 glasses of wine a month   Drug use: Never   Sexual activity: Not on file

## 2022-03-20 ENCOUNTER — Encounter: Payer: Self-pay | Admitting: Physician Assistant

## 2022-03-20 ENCOUNTER — Ambulatory Visit (INDEPENDENT_AMBULATORY_CARE_PROVIDER_SITE_OTHER): Payer: Commercial Managed Care - HMO | Admitting: Physician Assistant

## 2022-03-20 VITALS — BP 118/86 | HR 76 | Temp 98.3°F | Ht 60.0 in | Wt 382.4 lb

## 2022-03-20 DIAGNOSIS — D508 Other iron deficiency anemias: Secondary | ICD-10-CM

## 2022-03-20 DIAGNOSIS — K580 Irritable bowel syndrome with diarrhea: Secondary | ICD-10-CM | POA: Diagnosis not present

## 2022-03-20 DIAGNOSIS — R194 Change in bowel habit: Secondary | ICD-10-CM | POA: Diagnosis not present

## 2022-03-20 MED ORDER — DICYCLOMINE HCL 10 MG PO CAPS: 10.0000 mg | ORAL_CAPSULE | Freq: Three times a day (TID) | ORAL | 2 refills | Status: DC

## 2022-03-20 NOTE — Progress Notes (Signed)
Subjective:    Patient ID: Christina Dominguez, female    DOB: 1965-08-12, 56 y.o.   MRN: [id number]  Chief Complaint  Patient presents with   Follow-up    Pt is c/o extreme fatigue and IBS flare ups out of medication; pt is going to go to lab to discuss FOB test today and process;     HPI Patient is in today for possible IBS-D flare. Started about 2 days ago. Not as bad as flare-up in July. Going 3-4 times per day, very "violent" "blow-out" episodes. She ran out of dicyclomine. Feeling very weak and fatigued today. No blood in stool. No black color. Some odor. No recent antibiotic use.   Never had colonoscopy, only just Cologuard - negative this year.   Ate a [place] sandwich and some strawberries. Hasn't had anything to drink today.   No n/v/abdominal pain. No headaches. Slight dizziness. No CP or SOB.  Past Medical History:  Diagnosis Date   Anxiety    Aortic atherosclerosis ([place]) 08/19/2021   Arthritis    Asthma    Bipolar 1 disorder (HCC)    Chronic diastolic CHF (congestive heart failure) (HCC)    Class 3 severe obesity with serious comorbidity and body mass index (BMI) greater than or equal to 70 in adult [facility]) 12/21/2019   Depression    Diastolic dysfunction 20/25/4270   Diverticulosis 08/19/2021   Edema of both lower extremities    Hepatic steatosis 08/19/2021   Hepatomegaly 08/19/2021   High blood pressure    Hyperlipidemia    Joint pain    Morbid obesity ([place])    Pre-diabetes     Past Surgical History:  Procedure Laterality Date   APPLICATION OF WOUND VAC Left 12/20/2021   Procedure: APPLICATION OF WOUND VAC;  Surgeon: [name], MD;  Location: [facility];  Service: Orthopedics;  Laterality: Left;   I & D EXTREMITY Left 12/20/2021   Procedure: EXCISIONAL DEBRIDEMENT HEMATOMA LEFT LEG;  Surgeon: [name], MD;  Location: [facility];  Service: Orthopedics;  Laterality: Left;   NO PAST SURGERIES     RIGHT/LEFT HEART CATH AND CORONARY ANGIOGRAPHY N/A 10/06/2021    Procedure: RIGHT/LEFT HEART CATH AND CORONARY ANGIOGRAPHY;  Surgeon: Jettie Booze, MD;  Location: [facility] [facility];  Service: Cardiovascular;  Laterality: N/A;    Family History  Problem Relation Age of Onset   Heart Problems Mother        Broken heart syndrome   Hypertension Mother    Hypercholesterolemia Mother    Bone cancer Father    Testicular cancer Father    Heart attack Sister    Diabetes Brother    Colon cancer Maternal Grandfather    Dementia Paternal Grandmother     Social History   Tobacco Use   Smoking status: Never    Passive exposure: Never   Smokeless tobacco: Never  Vaping Use   Vaping Use: Never used  Substance Use Topics   Alcohol use: Not Currently    Comment: Maybe 2 glasses of wine a month   Drug use: Never     Allergies  Allergen Reactions   Aspirin Nausea And Vomiting   Eggs Or Egg-Derived Products Swelling   Influenza Vaccines Swelling    Review of Systems NEGATIVE UNLESS OTHERWISE INDICATED IN HPI      Objective:     BP 118/86 (BP Location: Right Arm)   Pulse 76   Temp 98.3 F (36.8 C) (Temporal)   Ht 5' (  1.524 m)   Wt (!) 382 lb 6.4 oz (173.5 kg)   SpO2 98%   BMI 74.68 kg/m   Wt Readings from Last 3 Encounters:  03/20/22 (!) 382 lb 6.4 oz (173.5 kg)  02/16/22 (!) 383 lb 3.2 oz (173.8 kg)  02/13/22 (!) 381 lb 1.9 oz (172.9 kg)    BP Readings from Last 3 Encounters:  03/20/22 118/86  02/16/22 134/76  02/13/22 132/78     Physical Exam     Assessment & Plan:   Problem List Items Addressed This Visit   None Visit Diagnoses     Recent change in frequency of bowel movements    -  Primary   Relevant Orders   CBC with Differential/Platelet   Comprehensive metabolic panel   Fecal occult blood, imunochemical   Ambulatory referral to Gastroenterology   Irritable bowel syndrome with diarrhea       Relevant Medications   dicyclomine (BENTYL) 10 MG capsule   Other Relevant Orders   CBC with  Differential/Platelet   Comprehensive metabolic panel   Fecal occult blood, imunochemical   Ambulatory referral to Gastroenterology   Other iron deficiency anemia       Relevant Orders   CBC with Differential/Platelet   Fecal occult blood, imunochemical   Ambulatory referral to Gastroenterology        Meds ordered this encounter  Medications   dicyclomine (BENTYL) 10 MG capsule    Sig: Take 1 capsule (10 mg total) by mouth 4 (four) times daily -  before meals and at bedtime.    Dispense:  120 capsule    Refill:  2   Plan: Labs today iFOB test Referral to GI  Restart on dicyclomine as directed Push fluids BLAND diet  ED if acutely worse Keep me updated!  Return if symptoms worsen or fail to improve.    Seona Clemenson M Angad Nabers, PA-C

## 2022-03-20 NOTE — Patient Instructions (Addendum)
Labs today iFOB test Referral to GI  Restart on dicyclomine as directed Push fluids BLAND diet  ED if acutely worse Keep me updated!

## 2022-03-21 ENCOUNTER — Encounter: Payer: Self-pay | Admitting: Family

## 2022-03-21 ENCOUNTER — Ambulatory Visit (INDEPENDENT_AMBULATORY_CARE_PROVIDER_SITE_OTHER): Payer: Commercial Managed Care - HMO | Admitting: Family

## 2022-03-21 DIAGNOSIS — S8012XA Contusion of left lower leg, initial encounter: Secondary | ICD-10-CM

## 2022-03-21 LAB — COMPREHENSIVE METABOLIC PANEL
ALT: 12 U/L (ref 0–35)
AST: 16 U/L (ref 0–37)
Albumin: 4.3 g/dL (ref 3.5–5.2)
Alkaline Phosphatase: 112 U/L (ref 39–117)
BUN: 20 mg/dL (ref 6–23)
CO2: 32 mEq/L (ref 19–32)
Calcium: 9.5 mg/dL (ref 8.4–10.5)
Chloride: 99 mEq/L (ref 96–112)
Creatinine, Ser: 0.89 mg/dL (ref 0.40–1.20)
GFR: 72.13 mL/min (ref >60.00)
Glucose, Bld: 94 mg/dL (ref 70–99)
Potassium: 4.6 mEq/L (ref 3.5–5.1)
Sodium: 140 mEq/L (ref 135–145)
Total Bilirubin: 0.4 mg/dL (ref 0.2–1.2)
Total Protein: 7.7 g/dL (ref 6.0–8.3)

## 2022-03-21 LAB — CBC WITH DIFFERENTIAL/PLATELET
Basophils Absolute: 0.1 10*3/uL (ref 0.0–0.1)
Basophils Relative: 0.8 % (ref 0.0–3.0)
Eosinophils Absolute: 0.2 10*3/uL (ref 0.0–0.7)
Eosinophils Relative: 2.1 % (ref 0.0–5.0)
HCT: 37.1 % (ref 36.0–46.0)
Hemoglobin: 12 g/dL (ref 12.0–15.0)
Lymphocytes Relative: 14 % (ref 12.0–46.0)
Lymphs Abs: 1.2 10*3/uL (ref 0.7–4.0)
MCHC: 32.5 g/dL (ref 30.0–36.0)
MCV: 91.9 fl (ref 78.0–100.0)
Monocytes Absolute: 0.5 10*3/uL (ref 0.1–1.0)
Monocytes Relative: 6.3 % (ref 3.0–12.0)
Neutro Abs: 6.5 10*3/uL (ref 1.4–7.7)
Neutrophils Relative %: 76.8 % (ref 43.0–77.0)
Platelets: 352 10*3/uL (ref 150.0–400.0)
RBC: 4.04 Mil/uL (ref 3.87–5.11)
RDW: 15.9 % — ABNORMAL HIGH (ref 11.5–15.5)
WBC: 8.5 10*3/uL (ref 4.0–10.5)

## 2022-03-21 NOTE — Progress Notes (Signed)
Office Visit Note   Patient: Christina Dominguez           Date of Birth: 02-13-1966           MRN: [id number] Visit Date: 03/21/2022              Requested by: Allwardt, Randa Evens, PA-C [place],  [place] [id number] PCP: Fredirick Lathe, PA-C  Chief Complaint  Patient presents with   Left Leg - Routine Post Op    12/20/21 Excisional deb of hematoma      HPI: Patient is a 56 year old woman who presents 1 week follow-up she had been trialed in a week using the compression stockings rather than a compression wrap.  She felt that this did not control her edema well and had difficulty donning.  Today she has a dry dressing and some Coban applied to her ulcer.  Assessment & Plan: Visit Diagnoses: No diagnosis found.  Plan: Ulcer is stable.  Will resume serial compression wraps Unna and Dynaflex applied today she will follow-up in 1 week for reevaluation and rewrapping.  Follow-Up Instructions: Return in about 1 week (around 03/28/2022).   Ortho Exam  Patient is alert, oriented, no adenopathy, well-dressed, normal affect, normal respiratory effort. Please see attached image.  The ulcer to her left lower extremity is stable her edema is again uncontrolled there is no surrounding erythema no drainage no concerning sign  continues to slowly improve.  Imaging: No results found. No images are attached to the encounter.    Labs: Lab Results  Component Value Date   HGBA1C 5.9 (H) 08/20/2021   HGBA1C 6.0 (H) 03/22/2020   HGBA1C 6.0 (H) 12/21/2019   CRP 2.4 02/16/2022   CRP 1.6 (H) 10/09/2021   REPTSTATUS 12/25/2021 FINAL 12/20/2021   GRAMSTAIN NO WBC SEEN NO ORGANISMS SEEN  12/20/2021   CULT  12/20/2021    No growth aerobically or anaerobically. Performed at [facility], [place] [address] [place], [place] [address]      Lab Results  Component Value Date   ALBUMIN 4.3 03/20/2022   ALBUMIN 4.1 02/16/2022   ALBUMIN 3.8 10/03/2021    No results  found for: "MG" Lab Results  Component Value Date   VD25OH 33.5 03/22/2020   VD25OH 28.6 (L) 12/21/2019    No results found for: "PREALBUMIN"    Latest Ref Rng & Units 03/20/2022    2:59 PM 02/16/2022    2:17 PM 01/31/2022    3:36 PM  CBC EXTENDED  WBC 4.0 - 10.5 K/uL 8.5  7.8  9.1   RBC 3.87 - 5.11 Mil/uL 4.04  3.76  3.83   Hemoglobin 12.0 - 15.0 g/dL 12.0  11.5  11.4   HCT 36.0 - 46.0 % 37.1  34.6  36.6   Platelets 150.0 - 400.0 K/uL 352.0  322.0  323   NEUT# 1.4 - 7.7 K/uL 6.5  6.0    Lymph# 0.7 - 4.0 K/uL 1.2  1.1       There is no height or weight on file to calculate BMI.  Orders:  No orders of the defined types were placed in this encounter.  No orders of the defined types were placed in this encounter.    Procedures: No procedures performed  Clinical Data: No additional findings.  ROS:  All other systems negative, except as noted in the HPI. Review of Systems  Objective: Vital Signs: There were no vitals taken for this visit.  Specialty  Comments:  No specialty comments available.  PMFS History: Patient Active Problem List   Diagnosis Date Noted   Hematoma of left lower leg    Wheezing 11/23/2021   Snoring 11/23/2021   Other secondary pulmonary hypertension ([place]) 11/23/2021   Hyponatremia 10/11/2021   Dyspnea on exertion    Dyslipidemia 10/04/2021   Mediastinal mass 10/04/2021   Obstructive sleep apnea 08/25/2021   RUQ pain 08/19/2021   Hepatic steatosis 08/19/2021   Hepatomegaly 08/19/2021   Aortic atherosclerosis ([place]) 08/19/2021   Diverticulosis 08/19/2021   Anxiety 08/02/2021   Bipolar 1 disorder ([place]) 08/02/2021   CHF (congestive heart failure) ([place]) 08/02/2021   Depression 08/02/2021   Joint pain 08/02/2021   Screening for malignant neoplasm of colon 08/02/2021   Prediabetes 12/22/2019   Vitamin D insufficiency 12/22/2019   Class 3 severe obesity with serious comorbidity and body mass index (BMI) greater than or equal to 70 in adult  ([place]) 12/21/2019   Essential hypertension 04/30/2019   Mild intermittent asthma without complication 14/43/1540   Past Medical History:  Diagnosis Date   Anxiety    Aortic atherosclerosis ([place]) 08/19/2021   Arthritis    Asthma    Bipolar 1 disorder (HCC)    Chronic diastolic CHF (congestive heart failure) (HCC)    Class 3 severe obesity with serious comorbidity and body mass index (BMI) greater than or equal to 70 in adult ([place]) 12/21/2019   Depression    Diastolic dysfunction 08/67/6195   Diverticulosis 08/19/2021   Edema of both lower extremities    Hepatic steatosis 08/19/2021   Hepatomegaly 08/19/2021   High blood pressure    Hyperlipidemia    Joint pain    Morbid obesity ([place])    Pre-diabetes     Family History  Problem Relation Age of Onset   Heart Problems Mother        Broken heart syndrome   Hypertension Mother    Hypercholesterolemia Mother    Bone cancer Father    Testicular cancer Father    Heart attack Sister    Diabetes Brother    Colon cancer Maternal Grandfather    Dementia Paternal Grandmother     Past Surgical History:  Procedure Laterality Date   APPLICATION OF WOUND VAC Left 12/20/2021   Procedure: APPLICATION OF WOUND VAC;  Surgeon: [name], MD;  Location: [place];  Service: Orthopedics;  Laterality: Left;   I & D EXTREMITY Left 12/20/2021   Procedure: EXCISIONAL DEBRIDEMENT HEMATOMA LEFT LEG;  Surgeon: [name], MD;  Location: [place];  Service: Orthopedics;  Laterality: Left;   NO PAST SURGERIES     RIGHT/LEFT HEART CATH AND CORONARY ANGIOGRAPHY N/A 10/06/2021   Procedure: RIGHT/LEFT HEART CATH AND CORONARY ANGIOGRAPHY;  Surgeon: Jettie Booze, MD;  Location: [facility];  Service: Cardiovascular;  Laterality: N/A;   Social History   Occupational History   Occupation: Medical illustrator, work from home  Tobacco Use   Smoking status: Never    Passive exposure: Never   Smokeless tobacco: Never  Vaping Use   Vaping Use:  Never used  Substance and Sexual Activity   Alcohol use: Not Currently    Comment: Maybe 2 glasses of wine a month   Drug use: Never   Sexual activity: Not on file

## 2022-03-28 ENCOUNTER — Ambulatory Visit (INDEPENDENT_AMBULATORY_CARE_PROVIDER_SITE_OTHER): Payer: Commercial Managed Care - HMO | Admitting: Family

## 2022-03-28 DIAGNOSIS — S8012XA Contusion of left lower leg, initial encounter: Secondary | ICD-10-CM | POA: Diagnosis not present

## 2022-03-29 ENCOUNTER — Other Ambulatory Visit: Payer: Self-pay | Admitting: Cardiology

## 2022-04-03 ENCOUNTER — Encounter: Payer: Self-pay | Admitting: Family

## 2022-04-03 NOTE — Progress Notes (Signed)
Office Visit Note   Patient: Christina Dominguez           Date of Birth: 04-May-1966           MRN: [id number] Visit Date: 03/28/2022              Requested by: Allwardt, Randa Evens, PA-C [place],  [place] [address] PCP: Fredirick Lathe, PA-C  Chief Complaint  Patient presents with   Left Leg - Follow-up    12/20/21 Excisional deb of hematoma      HPI: Patient is a 56 year old woman who presents 1 week follow-up she has been back in the unna and dynaflex with silvercell for the last week. Feels is getting back on track.  Has been set up with her lymphedema pumps.   Assessment & Plan: Visit Diagnoses: No diagnosis found.  Plan: Ulcer is stable. Continue serial compression wraps  Follow-Up Instructions: No follow-ups on file.   Ortho Exam  Patient is alert, oriented, no adenopathy, well-dressed, normal affect, normal respiratory effort. Please see attached image. Good wrinkling of skin to LLE from compression. The ulcer to her left lower extremity  is stable with no surrounding erythema no drainage no concerning sign  continues to slowly improve.  Imaging: No results found. No images are attached to the encounter.    Labs: Lab Results  Component Value Date   HGBA1C 5.9 (H) 08/20/2021   HGBA1C 6.0 (H) 03/22/2020   HGBA1C 6.0 (H) 12/21/2019   CRP 2.4 02/16/2022   CRP 1.6 (H) 10/09/2021   REPTSTATUS 12/25/2021 FINAL 12/20/2021   GRAMSTAIN NO WBC SEEN NO ORGANISMS SEEN  12/20/2021   CULT  12/20/2021    No growth aerobically or anaerobically. Performed at [facility], [place] [address], [place], [place] [address]      Lab Results  Component Value Date   ALBUMIN 4.3 03/20/2022   ALBUMIN 4.1 02/16/2022   ALBUMIN 3.8 10/03/2021    No results found for: "MG" Lab Results  Component Value Date   VD25OH 33.5 03/22/2020   VD25OH 28.6 (L) 12/21/2019    No results found for: "PREALBUMIN"    Latest Ref Rng & Units 03/20/2022    2:59 PM  02/16/2022    2:17 PM 01/31/2022    3:36 PM  CBC EXTENDED  WBC 4.0 - 10.5 K/uL 8.5  7.8  9.1   RBC 3.87 - 5.11 Mil/uL 4.04  3.76  3.83   Hemoglobin 12.0 - 15.0 g/dL 12.0  11.5  11.4   HCT 36.0 - 46.0 % 37.1  34.6  36.6   Platelets 150.0 - 400.0 K/uL 352.0  322.0  323   NEUT# 1.4 - 7.7 K/uL 6.5  6.0    Lymph# 0.7 - 4.0 K/uL 1.2  1.1       There is no height or weight on file to calculate BMI.  Orders:  No orders of the defined types were placed in this encounter.  No orders of the defined types were placed in this encounter.    Procedures: No procedures performed  Clinical Data: No additional findings.  ROS:  All other systems negative, except as noted in the HPI. Review of Systems  Objective: Vital Signs: There were no vitals taken for this visit.  Specialty Comments:  No specialty comments available.  PMFS History: Patient Active Problem List   Diagnosis Date Noted   Hematoma of left lower leg    Wheezing 11/23/2021   Snoring 11/23/2021  Other secondary pulmonary hypertension ([place]) 11/23/2021   Hyponatremia 10/11/2021   Dyspnea on exertion    Dyslipidemia 10/04/2021   Mediastinal mass 10/04/2021   Obstructive sleep apnea 08/25/2021   RUQ pain 08/19/2021   Hepatic steatosis 08/19/2021   Hepatomegaly 08/19/2021   Aortic atherosclerosis ([place]) 08/19/2021   Diverticulosis 08/19/2021   Anxiety 08/02/2021   Bipolar 1 disorder ([place]) 08/02/2021   CHF (congestive heart failure) ([place]) 08/02/2021   Depression 08/02/2021   Joint pain 08/02/2021   Screening for malignant neoplasm of colon 08/02/2021   Prediabetes 12/22/2019   Vitamin D insufficiency 12/22/2019   Class 3 severe obesity with serious comorbidity and body mass index (BMI) greater than or equal to 70 in adult [facility]) 12/21/2019   Essential hypertension 04/30/2019   Mild intermittent asthma without complication 01/60/1093   Past Medical History:  Diagnosis Date   Anxiety    Aortic atherosclerosis  ([place]) 08/19/2021   Arthritis    Asthma    Bipolar 1 disorder (HCC)    Chronic diastolic CHF (congestive heart failure) (HCC)    Class 3 severe obesity with serious comorbidity and body mass index (BMI) greater than or equal to 70 in adult ([place]) 12/21/2019   Depression    Diastolic dysfunction 23/55/7322   Diverticulosis 08/19/2021   Edema of both lower extremities    Hepatic steatosis 08/19/2021   Hepatomegaly 08/19/2021   High blood pressure    Hyperlipidemia    Joint pain    Morbid obesity ([place])    Pre-diabetes     Family History  Problem Relation Age of Onset   Heart Problems Mother        Broken heart syndrome   Hypertension Mother    Hypercholesterolemia Mother    Bone cancer Father    Testicular cancer Father    Heart attack Sister    Diabetes Brother    Colon cancer Maternal Grandfather    Dementia Paternal Grandmother     Past Surgical History:  Procedure Laterality Date   APPLICATION OF WOUND VAC Left 12/20/2021   Procedure: APPLICATION OF WOUND VAC;  Surgeon: [name], MD;  Location: [place];  Service: Orthopedics;  Laterality: Left;   I & D EXTREMITY Left 12/20/2021   Procedure: EXCISIONAL DEBRIDEMENT HEMATOMA LEFT LEG;  Surgeon: [name], MD;  Location: [place];  Service: Orthopedics;  Laterality: Left;   NO PAST SURGERIES     RIGHT/LEFT HEART CATH AND CORONARY ANGIOGRAPHY N/A 10/06/2021   Procedure: RIGHT/LEFT HEART CATH AND CORONARY ANGIOGRAPHY;  Surgeon: Jettie Booze, MD;  Location: [facility];  Service: Cardiovascular;  Laterality: N/A;   Social History   Occupational History   Occupation: Medical illustrator, work from home  Tobacco Use   Smoking status: Never    Passive exposure: Never   Smokeless tobacco: Never  Vaping Use   Vaping Use: Never used  Substance and Sexual Activity   Alcohol use: Not Currently    Comment: Maybe 2 glasses of wine a month   Drug use: Never   Sexual activity: Not on file

## 2022-04-04 ENCOUNTER — Ambulatory Visit: Payer: Commercial Managed Care - HMO | Admitting: Family

## 2022-04-04 DIAGNOSIS — I87331 Chronic venous hypertension (idiopathic) with ulcer and inflammation of right lower extremity: Secondary | ICD-10-CM | POA: Diagnosis not present

## 2022-04-04 DIAGNOSIS — S8012XA Contusion of left lower leg, initial encounter: Secondary | ICD-10-CM | POA: Diagnosis not present

## 2022-04-04 DIAGNOSIS — Z6841 Body Mass Index (BMI) 40.0 and over, adult: Secondary | ICD-10-CM

## 2022-04-11 ENCOUNTER — Ambulatory Visit: Payer: Commercial Managed Care - HMO | Admitting: Cardiology

## 2022-04-11 ENCOUNTER — Encounter: Payer: Self-pay | Admitting: Family

## 2022-04-11 ENCOUNTER — Ambulatory Visit: Payer: Commercial Managed Care - HMO | Admitting: Family

## 2022-04-11 DIAGNOSIS — S8012XA Contusion of left lower leg, initial encounter: Secondary | ICD-10-CM | POA: Diagnosis not present

## 2022-04-11 NOTE — Progress Notes (Signed)
Office Visit Note   Patient: Christina Dominguez           Date of Birth: [birth year]           MRN: [id number] Visit Date: 04/04/2022              Requested by: Allwardt, Randa Evens, PA-C [place],  [place] [id number] PCP: Fredirick Lathe, PA-C  Chief Complaint  Patient presents with   Left Leg - Follow-up    12/20/21 Excisional deb of hematoma      HPI: Patient is a 56 year old woman who presents for her weekly follow-up she has been having serial unna and dynaflex with silvercell. Feels is getting back on track.  Has been set up with her lymphedema pumps. Not clear if she is using.   Assessment & Plan: Visit Diagnoses:  1. Hematoma of left lower leg   2. Chronic venous hypertension (idiopathic) with ulcer and inflammation of right lower extremity (HCC)   3. Class 3 severe obesity due to excess calories with serious comorbidity and body mass index (BMI) greater than or equal to 70 in adult [facility])     Plan: Ulcer is stable. Continue serial compression wraps  Follow-Up Instructions: No follow-ups on file.   Ortho Exam  Patient is alert, oriented, no adenopathy, well-dressed, normal affect, normal respiratory effort. Please see attached image. Good wrinkling of skin to LLE from compression. The ulcer to her left lower extremity is stable with no surrounding erythema no drainage no concerning sign  Please see attached images.   Imaging: No results found. No images are attached to the encounter.    Labs: Lab Results  Component Value Date   HGBA1C 5.9 (H) 08/20/2021   HGBA1C 6.0 (H) 03/22/2020   HGBA1C 6.0 (H) 12/21/2019   CRP 2.4 02/16/2022   CRP 1.6 (H) 10/09/2021   REPTSTATUS 12/25/2021 FINAL 12/20/2021   GRAMSTAIN NO WBC SEEN NO ORGANISMS SEEN  12/20/2021   CULT  12/20/2021    No growth aerobically or anaerobically. Performed at [facility], [place] [address] [place], [place] [address]      Lab Results  Component Value Date    ALBUMIN 4.3 03/20/2022   ALBUMIN 4.1 02/16/2022   ALBUMIN 3.8 10/03/2021    No results found for: "MG" Lab Results  Component Value Date   VD25OH 33.5 03/22/2020   VD25OH 28.6 (L) 12/21/2019    No results found for: "PREALBUMIN"    Latest Ref Rng & Units 03/20/2022    2:59 PM 02/16/2022    2:17 PM 01/31/2022    3:36 PM  CBC EXTENDED  WBC 4.0 - 10.5 K/uL 8.5  7.8  9.1   RBC 3.87 - 5.11 Mil/uL 4.04  3.76  3.83   Hemoglobin 12.0 - 15.0 g/dL 12.0  11.5  11.4   HCT 36.0 - 46.0 % 37.1  34.6  36.6   Platelets 150.0 - 400.0 K/uL 352.0  322.0  323   NEUT# 1.4 - 7.7 K/uL 6.5  6.0    Lymph# 0.7 - 4.0 K/uL 1.2  1.1       There is no height or weight on file to calculate BMI.  Orders:  No orders of the defined types were placed in this encounter.  No orders of the defined types were placed in this encounter.    Procedures: No procedures performed  Clinical Data: No additional findings.  ROS:  All other systems negative, except as noted in  the HPI. Review of Systems  Objective: Vital Signs: There were no vitals taken for this visit.  Specialty Comments:  No specialty comments available.  PMFS History: Patient Active Problem List   Diagnosis Date Noted   Hematoma of left lower leg    Wheezing 11/23/2021   Snoring 11/23/2021   Other secondary pulmonary hypertension ([place]) 11/23/2021   Hyponatremia 10/11/2021   Dyspnea on exertion    Dyslipidemia 10/04/2021   Mediastinal mass 10/04/2021   Obstructive sleep apnea 08/25/2021   RUQ pain 08/19/2021   Hepatic steatosis 08/19/2021   Hepatomegaly 08/19/2021   Aortic atherosclerosis ([place]) 08/19/2021   Diverticulosis 08/19/2021   Anxiety 08/02/2021   Bipolar 1 disorder ([place]) 08/02/2021   CHF (congestive heart failure) ([place]) 08/02/2021   Depression 08/02/2021   Joint pain 08/02/2021   Screening for malignant neoplasm of colon 08/02/2021   Prediabetes 12/22/2019   Vitamin D insufficiency 12/22/2019   Class 3 severe  obesity with serious comorbidity and body mass index (BMI) greater than or equal to 70 in adult ([place]) 12/21/2019   Essential hypertension 04/30/2019   Mild intermittent asthma without complication 42/68/3419   Past Medical History:  Diagnosis Date   Anxiety    Aortic atherosclerosis ([place]) 08/19/2021   Arthritis    Asthma    Bipolar 1 disorder (HCC)    Chronic diastolic CHF (congestive heart failure) (HCC)    Class 3 severe obesity with serious comorbidity and body mass index (BMI) greater than or equal to 70 in adult ([place]) 12/21/2019   Depression    Diastolic dysfunction 62/22/9798   Diverticulosis 08/19/2021   Edema of both lower extremities    Hepatic steatosis 08/19/2021   Hepatomegaly 08/19/2021   High blood pressure    Hyperlipidemia    Joint pain    Morbid obesity ([place])    Pre-diabetes     Family History  Problem Relation Age of Onset   Heart Problems Mother        Broken heart syndrome   Hypertension Mother    Hypercholesterolemia Mother    Bone cancer Father    Testicular cancer Father    Heart attack Sister    Diabetes Brother    Colon cancer Maternal Grandfather    Dementia Paternal Grandmother     Past Surgical History:  Procedure Laterality Date   APPLICATION OF WOUND VAC Left 12/20/2021   Procedure: APPLICATION OF WOUND VAC;  Surgeon: [name], MD;  Location: [facility];  Service: Orthopedics;  Laterality: Left;   I & D EXTREMITY Left 12/20/2021   Procedure: EXCISIONAL DEBRIDEMENT HEMATOMA LEFT LEG;  Surgeon: [name], MD;  Location: [facility];  Service: Orthopedics;  Laterality: Left;   NO PAST SURGERIES     RIGHT/LEFT HEART CATH AND CORONARY ANGIOGRAPHY N/A 10/06/2021   Procedure: RIGHT/LEFT HEART CATH AND CORONARY ANGIOGRAPHY;  Surgeon: Jettie Booze, MD;  Location: [facility];  Service: Cardiovascular;  Laterality: N/A;   Social History   Occupational History   Occupation: Medical illustrator, work from home  Tobacco Use   Smoking  status: Never    Passive exposure: Never   Smokeless tobacco: Never  Vaping Use   Vaping Use: Never used  Substance and Sexual Activity   Alcohol use: Not Currently    Comment: Maybe 2 glasses of wine a month   Drug use: Never   Sexual activity: Not on file

## 2022-04-11 NOTE — Progress Notes (Signed)
Office Visit Note   Patient: Christina Dominguez           Date of Birth: 1966/02/09           MRN: [id number] Visit Date: 04/11/2022              Requested by: Allwardt, Randa Evens, PA-C [place],  [place] [id number] PCP: Fredirick Lathe, PA-C  No chief complaint on file.     HPI: The patient is a 56 year old woman who presents in routine follow-up we have been doing serial Unna and Dynaflex compression wraps for the wound to the left lower extremity  Assessment & Plan: Visit Diagnoses: No diagnosis found.  Plan: Pleased with improvement serial compression follow-up in 1 week for reevaluation  Follow-Up Instructions: No follow-ups on file.   Ortho Exam  Patient is alert, oriented, no adenopathy, well-dressed, normal affect, normal respiratory effort. On examination of the left leg there is good wrinkling of the skin up to the level where the wrap was in place her ulcer is about half the width that has been previously marked improvement there is bleeding granulation no erythema no odor  Imaging: No results found. No images are attached to the encounter.  Labs: Lab Results  Component Value Date   HGBA1C 5.9 (H) 08/20/2021   HGBA1C 6.0 (H) 03/22/2020   HGBA1C 6.0 (H) 12/21/2019   CRP 2.4 02/16/2022   CRP 1.6 (H) 10/09/2021   REPTSTATUS 12/25/2021 FINAL 12/20/2021   GRAMSTAIN NO WBC SEEN NO ORGANISMS SEEN  12/20/2021   CULT  12/20/2021    No growth aerobically or anaerobically. Performed at [facility], [place] [address], [place], [place] [address]      Lab Results  Component Value Date   ALBUMIN 4.3 03/20/2022   ALBUMIN 4.1 02/16/2022   ALBUMIN 3.8 10/03/2021    No results found for: "MG" Lab Results  Component Value Date   VD25OH 33.5 03/22/2020   VD25OH 28.6 (L) 12/21/2019    No results found for: "PREALBUMIN"    Latest Ref Rng & Units 03/20/2022    2:59 PM 02/16/2022    2:17 PM 01/31/2022    3:36 PM  CBC EXTENDED  WBC 4.0 -  10.5 K/uL 8.5  7.8  9.1   RBC 3.87 - 5.11 Mil/uL 4.04  3.76  3.83   Hemoglobin 12.0 - 15.0 g/dL 12.0  11.5  11.4   HCT 36.0 - 46.0 % 37.1  34.6  36.6   Platelets 150.0 - 400.0 K/uL 352.0  322.0  323   NEUT# 1.4 - 7.7 K/uL 6.5  6.0    Lymph# 0.7 - 4.0 K/uL 1.2  1.1       There is no height or weight on file to calculate BMI.  Orders:  No orders of the defined types were placed in this encounter.  No orders of the defined types were placed in this encounter.    Procedures: No procedures performed  Clinical Data: No additional findings.  ROS:  All other systems negative, except as noted in the HPI. Review of Systems  Objective: Vital Signs: There were no vitals taken for this visit.  Specialty Comments:  No specialty comments available.  PMFS History: Patient Active Problem List   Diagnosis Date Noted   Hematoma of left lower leg    Wheezing 11/23/2021   Snoring 11/23/2021   Other secondary pulmonary hypertension ([place]) 11/23/2021   Hyponatremia 10/11/2021   Dyspnea on exertion  Dyslipidemia 10/04/2021   Mediastinal mass 10/04/2021   Obstructive sleep apnea 08/25/2021   RUQ pain 08/19/2021   Hepatic steatosis 08/19/2021   Hepatomegaly 08/19/2021   Aortic atherosclerosis ([place]) 08/19/2021   Diverticulosis 08/19/2021   Anxiety 08/02/2021   Bipolar 1 disorder ([place]) 08/02/2021   CHF (congestive heart failure) ([place]) 08/02/2021   Depression 08/02/2021   Joint pain 08/02/2021   Screening for malignant neoplasm of colon 08/02/2021   Prediabetes 12/22/2019   Vitamin D insufficiency 12/22/2019   Class 3 severe obesity with serious comorbidity and body mass index (BMI) greater than or equal to 70 in adult [facility]) 12/21/2019   Essential hypertension 04/30/2019   Mild intermittent asthma without complication 35/00/9381   Past Medical History:  Diagnosis Date   Anxiety    Aortic atherosclerosis ([place]) 08/19/2021   Arthritis    Asthma    Bipolar 1 disorder (HCC)     Chronic diastolic CHF (congestive heart failure) (HCC)    Class 3 severe obesity with serious comorbidity and body mass index (BMI) greater than or equal to 70 in adult ([place]) 12/21/2019   Depression    Diastolic dysfunction 82/99/3716   Diverticulosis 08/19/2021   Edema of both lower extremities    Hepatic steatosis 08/19/2021   Hepatomegaly 08/19/2021   High blood pressure    Hyperlipidemia    Joint pain    Morbid obesity ([place])    Pre-diabetes     Family History  Problem Relation Age of Onset   Heart Problems Mother        Broken heart syndrome   Hypertension Mother    Hypercholesterolemia Mother    Bone cancer Father    Testicular cancer Father    Heart attack Sister    Diabetes Brother    Colon cancer Maternal Grandfather    Dementia Paternal Grandmother     Past Surgical History:  Procedure Laterality Date   APPLICATION OF WOUND VAC Left 12/20/2021   Procedure: APPLICATION OF WOUND VAC;  Surgeon: [name], MD;  Location: [place];  Service: Orthopedics;  Laterality: Left;   I & D EXTREMITY Left 12/20/2021   Procedure: EXCISIONAL DEBRIDEMENT HEMATOMA LEFT LEG;  Surgeon: [name], MD;  Location: [place];  Service: Orthopedics;  Laterality: Left;   NO PAST SURGERIES     RIGHT/LEFT HEART CATH AND CORONARY ANGIOGRAPHY N/A 10/06/2021   Procedure: RIGHT/LEFT HEART CATH AND CORONARY ANGIOGRAPHY;  Surgeon: Jettie Booze, MD;  Location: [place] [facility];  Service: Cardiovascular;  Laterality: N/A;   Social History   Occupational History   Occupation: Medical illustrator, work from home  Tobacco Use   Smoking status: Never    Passive exposure: Never   Smokeless tobacco: Never  Vaping Use   Vaping Use: Never used  Substance and Sexual Activity   Alcohol use: Not Currently    Comment: Maybe 2 glasses of wine a month   Drug use: Never   Sexual activity: Not on file

## 2022-04-12 ENCOUNTER — Ambulatory Visit: Payer: Commercial Managed Care - HMO | Attending: Nurse Practitioner | Admitting: Nurse Practitioner

## 2022-04-12 ENCOUNTER — Encounter: Payer: Self-pay | Admitting: Nurse Practitioner

## 2022-04-12 VITALS — BP 124/76 | HR 78 | Ht 60.0 in | Wt 394.4 lb

## 2022-04-12 DIAGNOSIS — G4733 Obstructive sleep apnea (adult) (pediatric): Secondary | ICD-10-CM

## 2022-04-12 DIAGNOSIS — I272 Pulmonary hypertension, unspecified: Secondary | ICD-10-CM

## 2022-04-12 DIAGNOSIS — Z6841 Body Mass Index (BMI) 40.0 and over, adult: Secondary | ICD-10-CM

## 2022-04-12 DIAGNOSIS — I10 Essential (primary) hypertension: Secondary | ICD-10-CM

## 2022-04-12 DIAGNOSIS — I5032 Chronic diastolic (congestive) heart failure: Secondary | ICD-10-CM

## 2022-04-12 DIAGNOSIS — E782 Mixed hyperlipidemia: Secondary | ICD-10-CM | POA: Diagnosis not present

## 2022-04-12 DIAGNOSIS — R7303 Prediabetes: Secondary | ICD-10-CM

## 2022-04-12 NOTE — Patient Instructions (Signed)
Medication Instructions:  Your physician recommends that you continue on your current medications as directed. Please refer to the Current Medication list given to you today.  *If you need a refill on your cardiac medications before your next appointment, please call your pharmacy*  Lab Work: NONE ordered at this time of appointment   If you have labs (blood work) drawn today and your tests are completely normal, you will receive your results only by: [place] (if you have MyChart) OR A paper copy in the mail If you have any lab test that is abnormal or we need to change your treatment, we will call you to review the results.  Testing/Procedures: NONE ordered at this time of appointment   Follow-Up: At [facility], you and your health needs are our priority.  As part of our continuing mission to provide you with exceptional heart care, we have created designated Provider Care Teams.  These Care Teams include your primary Cardiologist (physician) and Advanced Practice Providers (APPs -  Physician Assistants and Nurse Practitioners) who all work together to provide you with the care you need, when you need it.  Your next appointment:   As previously scheduled    The format for your next appointment:   In Person  Provider:   Shirlee More, MD     Other Instructions   Important Information About Sugar

## 2022-04-12 NOTE — Progress Notes (Signed)
Office Visit    Patient Name: Christina Dominguez Date of Encounter: 04/12/2022  Primary Care Provider:  Allwardt, Randa Evens, PA-C Primary Cardiologist:  Shirlee More, MD  Chief Complaint    56 year old female with a history of chronic diastolic heart failure, pulmonary hypertension, OSA, hypertension, hyperlipidemia, prediabetes, and morbid obesity who presents for follow-up related to heart failure.  Past Medical History    Past Medical History:  Diagnosis Date   Anxiety    Aortic atherosclerosis ([place]) 08/19/2021   Arthritis    Asthma    Bipolar 1 disorder (HCC)    Chronic diastolic CHF (congestive heart failure) (HCC)    Class 3 severe obesity with serious comorbidity and body mass index (BMI) greater than or equal to 70 in adult [facility]) 12/21/2019   Depression    Diastolic dysfunction 25/42/7062   Diverticulosis 08/19/2021   Edema of both lower extremities    Hepatic steatosis 08/19/2021   Hepatomegaly 08/19/2021   High blood pressure    Hyperlipidemia    Joint pain    Morbid obesity ([place])    Pre-diabetes    Past Surgical History:  Procedure Laterality Date   APPLICATION OF WOUND VAC Left 12/20/2021   Procedure: APPLICATION OF WOUND VAC;  Surgeon: [name], MD;  Location: [place];  Service: Orthopedics;  Laterality: Left;   I & D EXTREMITY Left 12/20/2021   Procedure: EXCISIONAL DEBRIDEMENT HEMATOMA LEFT LEG;  Surgeon: [name], MD;  Location: [place];  Service: Orthopedics;  Laterality: Left;   NO PAST SURGERIES     RIGHT/LEFT HEART CATH AND CORONARY ANGIOGRAPHY N/A 10/06/2021   Procedure: RIGHT/LEFT HEART CATH AND CORONARY ANGIOGRAPHY;  Surgeon: Jettie Booze, MD;  Location: [place] [facility];  Service: Cardiovascular;  Laterality: N/A;    Allergies  Allergies  Allergen Reactions   Aspirin Nausea And Vomiting   Eggs Or Egg-Derived Products Swelling   Influenza Vaccines Swelling    History of Present Illness    56 year old female with the above  past medical history including chronic diastolic heart failure, pulmonary hypertension, OSA, hypertension, hyperlipidemia, prediabetes, and morbid obesity.  Echocardiogram in January 2023 revealed EF 55 to 60%, no RWMA, normal LV function, mild concentric LVH, G1 DD, normal RV systolic function, no significant valvular abnormalities. R/LHC in 10/2021 revealed no evidence of CAD, EF 55 to 65%, moderately elevated LVEDP, normal LV systolic function, moderate pulmonary hypertension, mean PA pressure 43 mmHg.  She was last seen in the office on 02/13/2022 and was mildly fluid volume overloaded.  Labs were stable, she was advised to continue Lasix 120 mg daily.  She presents today for follow-up.  Since her last visit she has been stable from a cardiac standpoint.  She actually reports feeling much better.  Her energy levels are up, she denies any dyspnea, edema.  She does note that her weight has increased over the past month, she thinks this may be attributed to the fact that she was on a brat diet for a couple of weeks due to IBS.  She has since resumed a normal diet and thinks her weight gain may be related to this.  She also notes a dry cough in the mornings.  She has not been able to participate in her regular water aerobics recently due to the fact that she suffered a fall (she fell out of a chair at home and injured her left foot).  Otherwise, she reports feeling well and denies any additional concerns today.  Home Medications    Current Outpatient Medications  Medication Sig Dispense Refill   acetaminophen (TYLENOL) 650 MG CR tablet Take 1,300 mg by mouth every 8 (eight) hours as needed for pain.     albuterol (VENTOLIN HFA) 108 (90 Base) MCG/ACT inhaler Inhale 2 puffs into the lungs every 6 (six) hours as needed for wheezing or shortness of breath. 8 g 6   CATS CLAW, UNCARIA TOMENTOSA, PO Take 2 capsules by mouth daily.     Cholecalciferol (VITAMIN D) 50 MCG (2000 UT) tablet Take 2,000 Units by mouth  daily.     diclofenac Sodium (VOLTAREN) 1 % GEL Apply 2 g topically daily as needed (pain).     dicyclomine (BENTYL) 10 MG capsule Take 1 capsule (10 mg total) by mouth 4 (four) times daily -  before meals and at bedtime. 120 capsule 2   diphenhydrAMINE (BENADRYL) 25 MG tablet Take 50 mg by mouth at bedtime as needed for sleep.     furosemide (LASIX) 80 MG tablet Take 1 tablet (80 mg total) by mouth daily. 90 tablet 1   OVER THE COUNTER MEDICATION Take 3 tablets by mouth daily. Mobility essentials supplement     potassium chloride SA (KLOR-CON M) 20 MEQ tablet Take 1 tablet (20 mEq total) by mouth daily. 30 tablet 5   TURMERIC PO Take 1 tablet by mouth daily.     valsartan (DIOVAN) 40 MG tablet Take 1 tablet (40 mg total) by mouth daily. 90 tablet 3   No current facility-administered medications for this visit.     Review of Systems    She denies chest pain, palpitations, dyspnea, pnd, orthopnea, n, v, dizziness, syncope, edema, weight gain, or early satiety. All other systems reviewed and are otherwise negative except as noted above.   Physical Exam    VS:  BP 124/76   Pulse 78   Ht 5' (1.524 m)   Wt (!) 394 lb 6.4 oz (178.9 kg)   SpO2 94%   BMI 77.03 kg/m   GEN: Well nourished, well developed, in no acute distress. HEENT: normal. Neck: Supple, no JVD, carotid bruits, or masses. Cardiac: RRR, no murmurs, rubs, or gallops. No clubbing, cyanosis, edema.  Radials/DP/PT 2+ and equal bilaterally.  Respiratory:  Respirations regular and unlabored, clear to auscultation bilaterally. GI: Soft, nontender, nondistended, BS + x 4. MS: no deformity or atrophy. Skin: warm and dry, no rash. Neuro:  Strength and sensation are intact. Psych: Normal affect.  Accessory Clinical Findings    ECG personally reviewed by me today - No EKG in office today.     Lab Results  Component Value Date   WBC 8.5 03/20/2022   HGB 12.0 03/20/2022   HCT 37.1 03/20/2022   MCV 91.9 03/20/2022   PLT 352.0  03/20/2022   Lab Results  Component Value Date   CREATININE 0.89 03/20/2022   BUN 20 03/20/2022   NA 140 03/20/2022   K 4.6 03/20/2022   CL 99 03/20/2022   CO2 32 03/20/2022   Lab Results  Component Value Date   ALT 12 03/20/2022   AST 16 03/20/2022   ALKPHOS 112 03/20/2022   BILITOT 0.4 03/20/2022   Lab Results  Component Value Date   CHOL 158 12/21/2019   HDL 58 12/21/2019   LDLCALC 88 12/21/2019   TRIG 58 12/21/2019    Lab Results  Component Value Date   HGBA1C 5.9 (H) 08/20/2021    Assessment & Plan    1. Chronic diastolic  heart failure: Echo in January 2023 revealed EF 55 to 60%, no RWMA, normal LV function, mild concentric LVH, G1 DD, normal RV systolic function, no significant valvular abnormalities.  Weight is up >10 pounds in the past month.  She appears euvolemic on exam, she denies any dyspnea, edema, PND, orthopnea.  Continue to monitor.  For now, continue valsartan, Lasix at current dose (120 mg daily).  2. Pulmonary hypertension:  R/LHC in 10/2021 revealed no evidence of CAD, EF 55 to 65%, moderately elevated LVEDP, normal LV systolic function, moderate pulmonary hypertension, mean PA pressure 43 mmHg.  To be secondary to untreated OSA and diastolic heart failure.  Generally euvolemic well compensated exam.  She denies dyspnea. Continue Lasix as above.  3. Hypertension: BP well controlled. Continue current antihypertensive regimen.   4. Hyperlipidemia: No recent LDL on file. No longer on statin therapy. The 10-year ASCVD risk score (Arnett DK, et al., 2019) is: 3.9%  5. Prediabetes: A1c was 5.9 in 08/2021.  Monitored and  managed per PCP.  6. OSA: She is still awaiting her CPAP.  Follow-up with pulmonology.  7. Obesity: Continue weight loss efforts. She is eager to get back to her water aerobics as soon as her foot is healed.  8. Disposition: Follow-up scheduled with Dr. Bettina Gavia (per patient preference).     Lenna Sciara, NP 04/12/2022, 2:04 PM

## 2022-04-14 IMAGING — MG MM DIGITAL DIAGNOSTIC UNILAT*R* W/ TOMO W/ CAD
4 series · 4 of 12 positions shown · non-contrast
Comparison: Baseline mammogram 12/02/2019.

CLINICAL DATA: Recall from baseline screening mammography with
tomosynthesis, possible mass involving the UPPER RIGHT breast at
ANTERIOR depth.

EXAM:
DIGITAL DIAGNOSTIC RIGHT MAMMOGRAM WITH TOMO
ULTRASOUND RIGHT BREAST

[R MLO synth-2D]
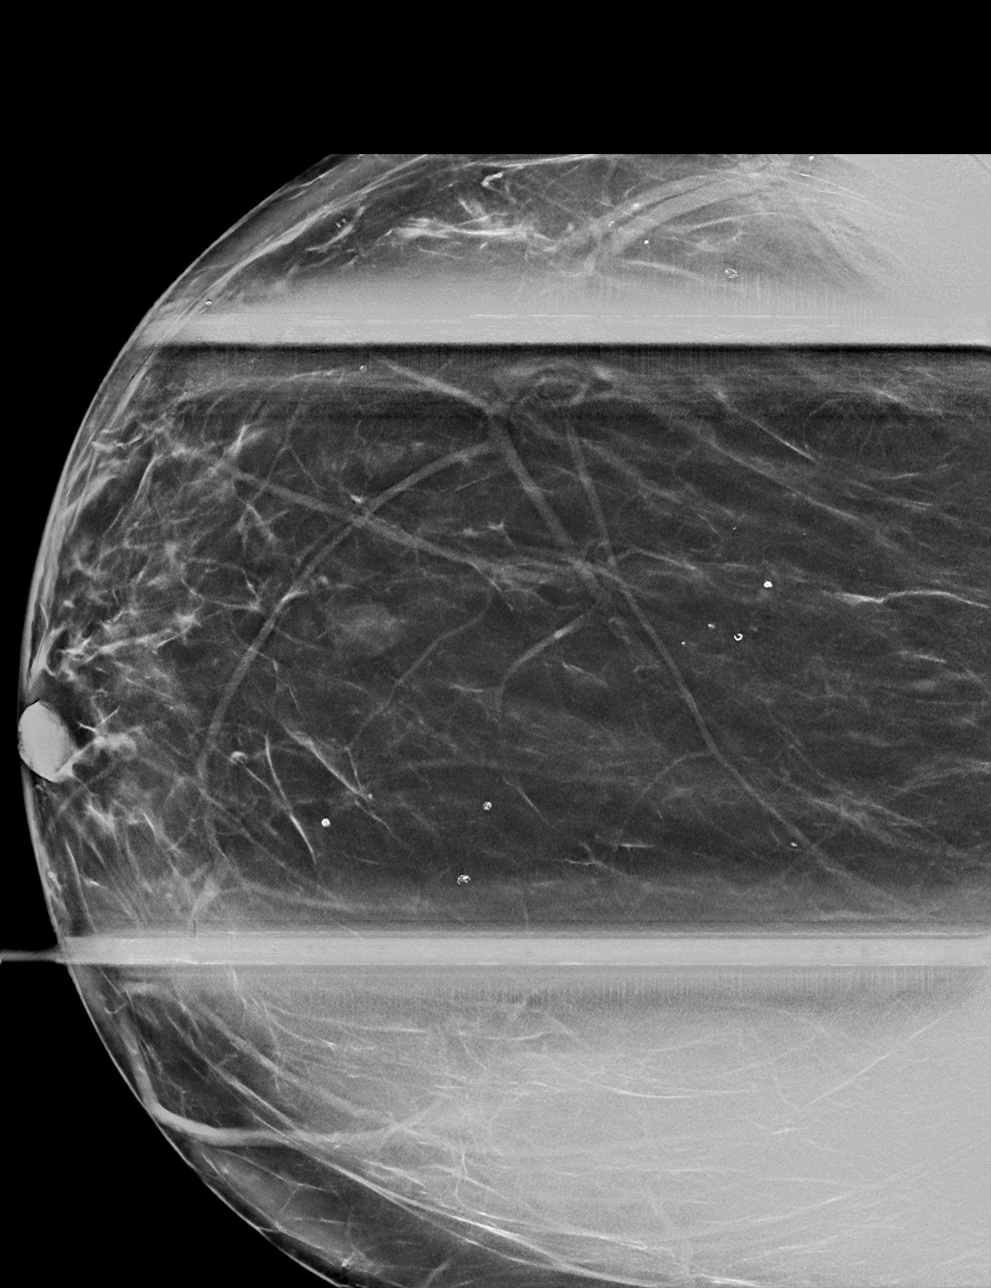

[R CC synth-2D]
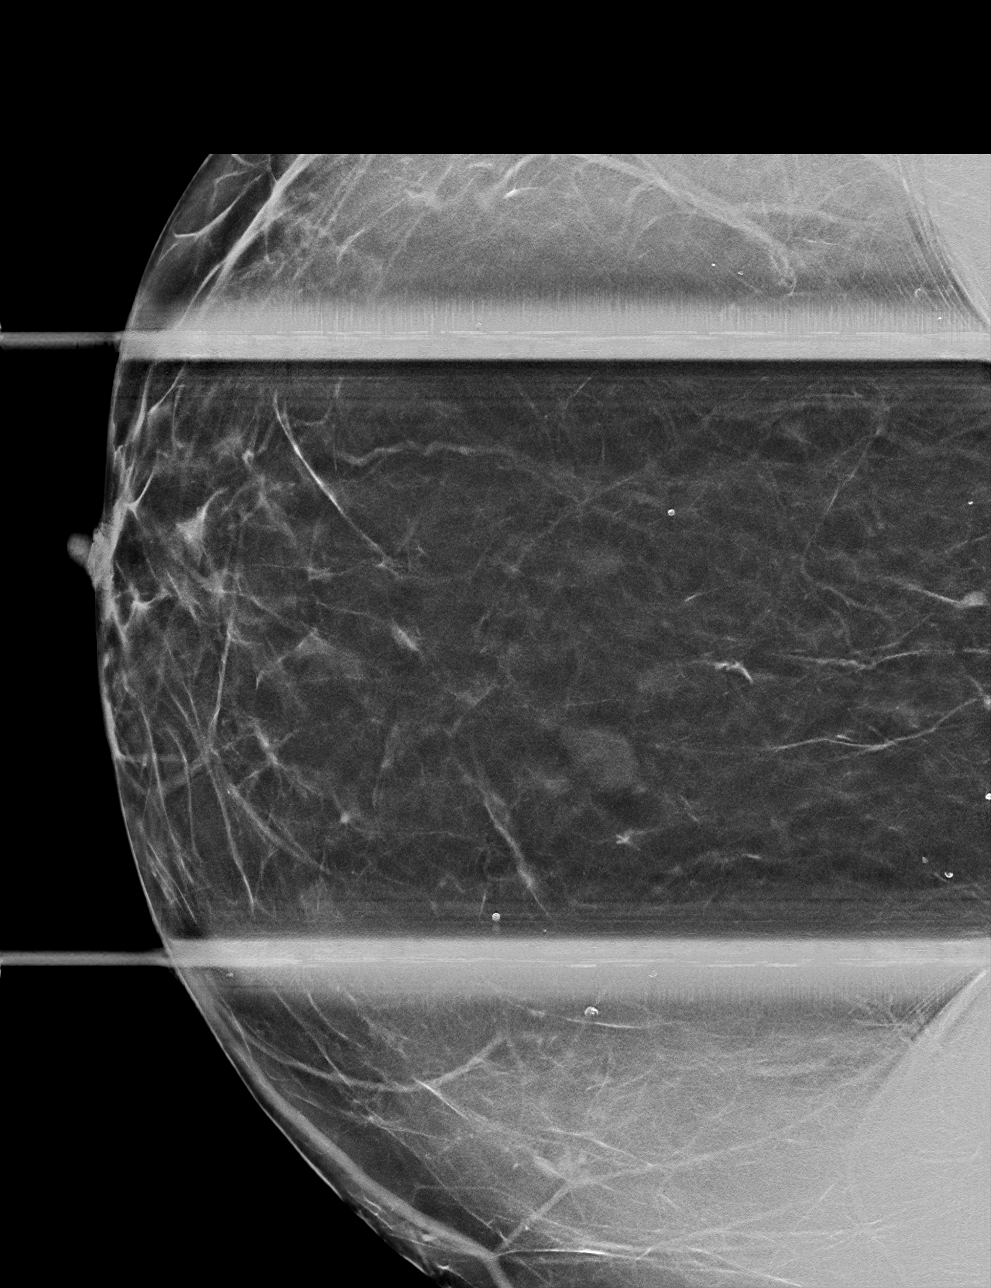

[R MLO tomo · tomo slice 39/78.0]
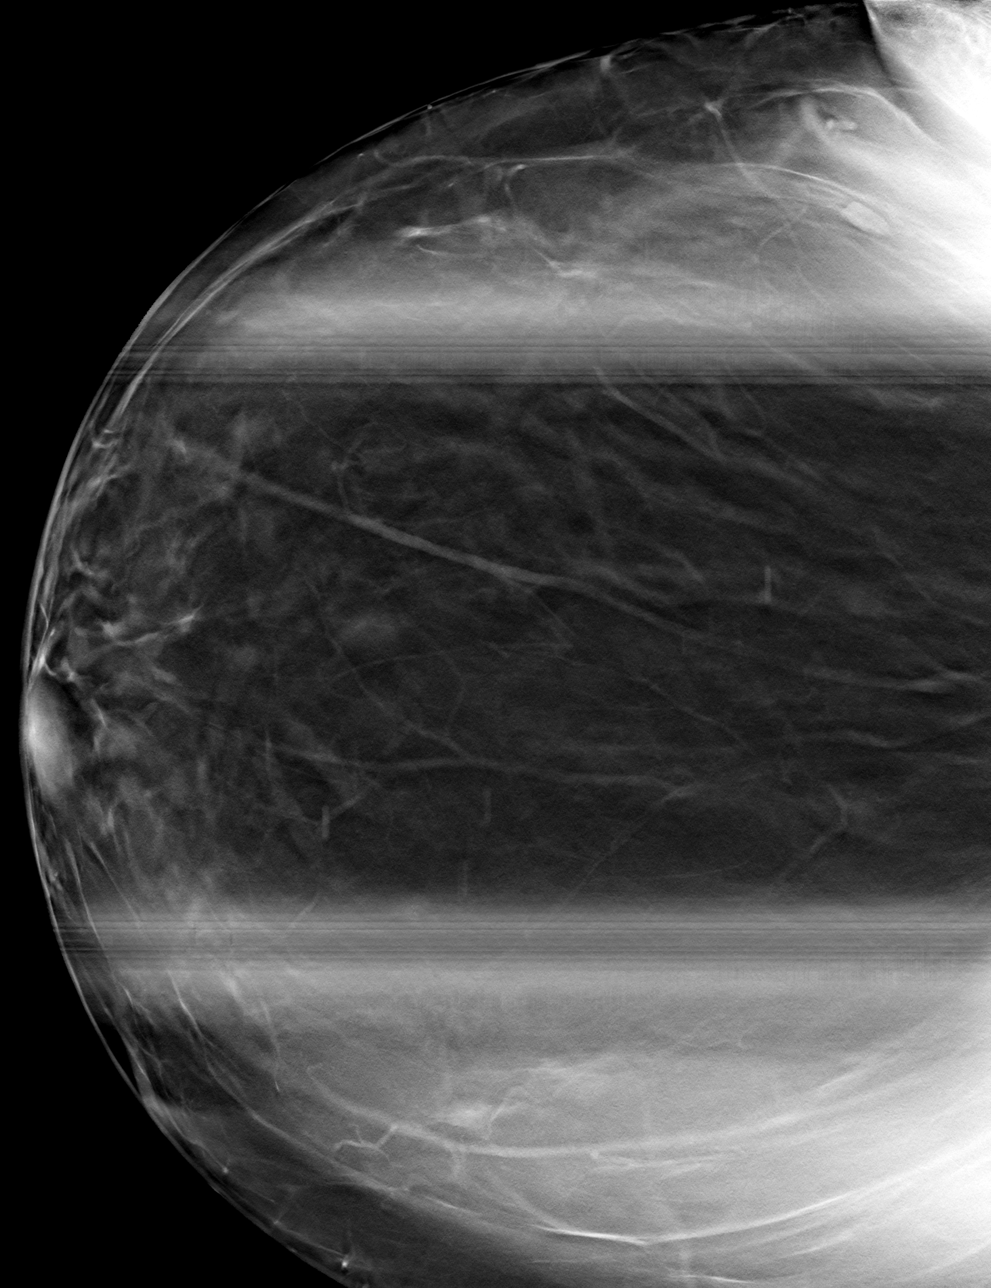

[R CC tomo · tomo slice 33/64.0]
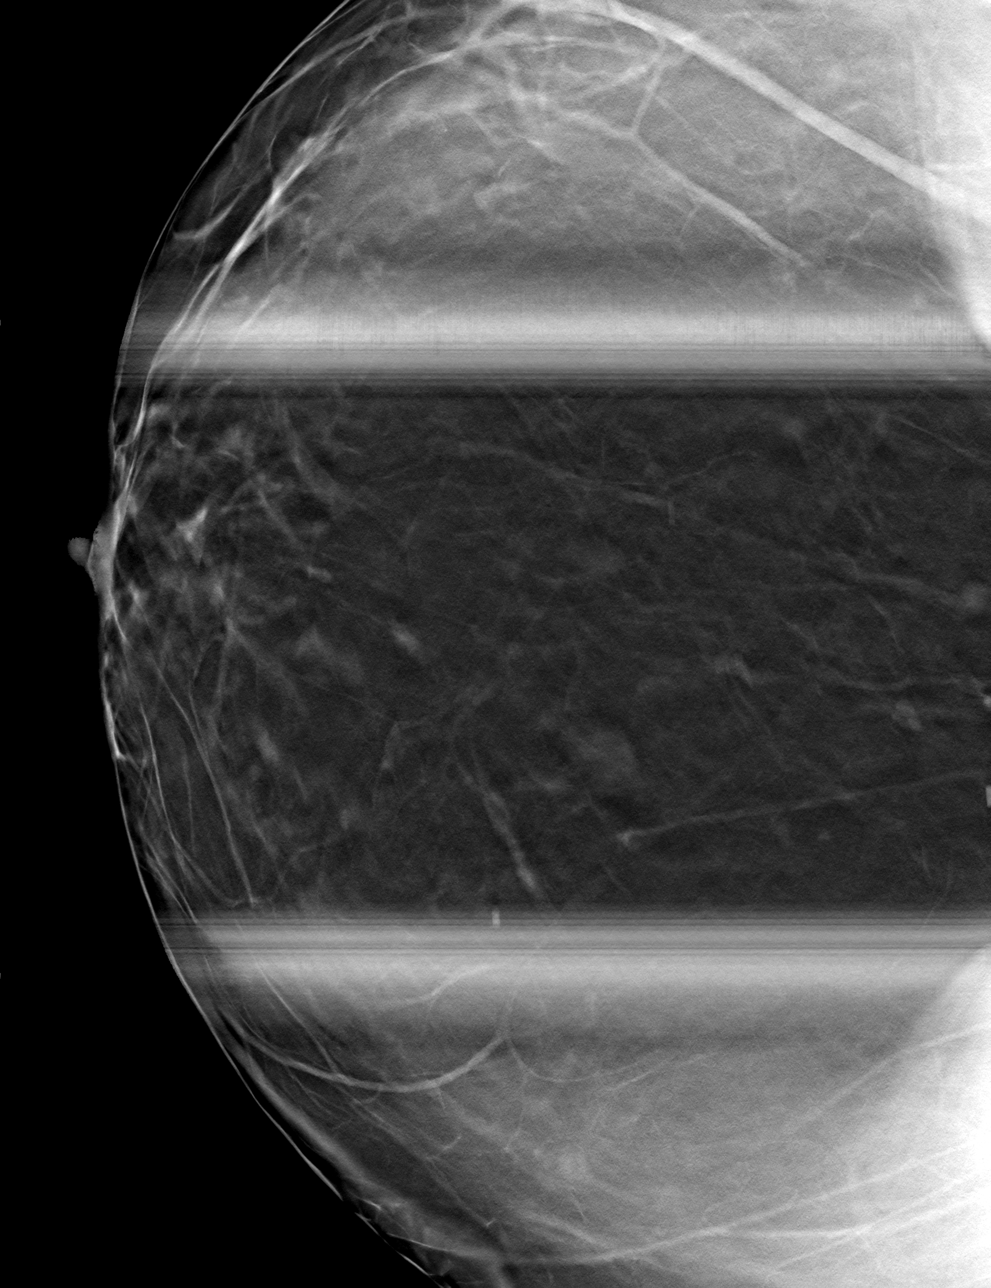

[4 of 12 positions shown; findings below may reference images not displayed]

No prior ultrasound.

ACR Breast Density Category b: There are scattered areas of
fibroglandular density.
FINDINGS: Tomosynthesis and synthesized spot-compression CC and MLO views of
the area of concern in the RIGHT breast were obtained.

Spot compression images confirm a circumscribed low-density mass in
the UPPER breast at ANTERIOR depth which measures approximately
cm. There is no associated architectural distortion or suspicious
calcifications. Scattered benign dystrophic calcifications are
present throughout the breast.

Targeted RIGHT breast ultrasound is performed, showing a
circumscribed oval parallel isoechoic mass at the 12 o'clock
position approximately 9 cm from nipple at ANTERIOR depth measuring
approximately 7 x 2 x 7 mm, demonstrating no posterior
characteristics and no internal power Doppler flow. Immediately
adjacent to this at the 12 o'clock position approximately 9 cm from
nipple is a circumscribed oval parallel hypoechoic mass measuring
approximately 8 x 2 x 5 mm, demonstrating no posterior
characteristics and demonstrating no internal power Doppler flow,
within a focus of fibroglandular tissue (the fibroglandular tissue
measures approximately 1.4 cm and therefore likely accounts for the
screening mammographic finding). No suspicious solid mass or
abnormal acoustic shadowing is identified.
IMPRESSION: Likely benign masses in the UPPER RIGHT breast at the 12 o'clock
position approximately 9 cm from the nipple at ANTERIOR depth, the
larger of which is within an island of fibroglandular tissue and
likely accounts for the screening mammographic finding.

RECOMMENDATION:
Diagnostic RIGHT mammogram and RIGHT breast ultrasound in 6 months.

I have discussed the findings and recommendations with the patient.
If applicable, a reminder letter will be sent to the patient
regarding the next appointment.

BI-RADS CATEGORY  3: Probably benign.

## 2022-04-16 DIAGNOSIS — J45909 Unspecified asthma, uncomplicated: Secondary | ICD-10-CM | POA: Insufficient documentation

## 2022-04-16 DIAGNOSIS — M1990 Unspecified osteoarthritis, unspecified site: Secondary | ICD-10-CM | POA: Insufficient documentation

## 2022-04-16 NOTE — Progress Notes (Deleted)
Cardiology Office Note:    Date:  04/16/2022   ID:  Christina Dominguez, DOB August 27, 1965, MRN [id number]  PCP:  Fredirick Lathe, PA-C  Cardiologist:  Shirlee More, MD    Referring MD: Fredirick Lathe, PA-C    ASSESSMENT:    No diagnosis found. PLAN:    In order of problems listed above:  ***   Next appointment: ***   Medication Adjustments/Labs and Tests Ordered: Current medicines are reviewed at length with the patient today.  Concerns regarding medicines are outlined above.  No orders of the defined types were placed in this encounter.  No orders of the defined types were placed in this encounter.   No chief complaint on file.   History of Present Illness:    Christina Dominguez is a 56 y.o. female with a hx of diastolic heart failure severe obesity obstructive sleep apnea pulmonary hypertension and dyslipidemia last seen 02/13/2022 by Dr. Agustin Cree.  Compliance with diet, lifestyle and medications: ***  She underwent left and right heart catheterization 10/06/2021 which showed no evidence of CAD hemodynamic findings were consistent with moderate pulmonary hypertension with a mean pulmonary pressure of 43 pulmonary capillary wedge pressure of 24.  She is seen in the [facility] 04/12/2022 pending CPAP therapy and referred to see me in the office today. Past Medical History:  Diagnosis Date   Anxiety    Aortic atherosclerosis ([place]) 08/19/2021   Arthritis    Asthma    Bipolar 1 disorder (HCC)    Chronic diastolic CHF (congestive heart failure) (HCC)    Class 3 severe obesity with serious comorbidity and body mass index (BMI) greater than or equal to 70 in adult [facility]) 12/21/2019   Depression    Diastolic dysfunction 95/18/8416   Diverticulosis 08/19/2021   Edema of both lower extremities    Hepatic steatosis 08/19/2021   Hepatomegaly 08/19/2021   High blood pressure    Hyperlipidemia    Joint pain    Morbid obesity ([place])    Pre-diabetes     Past  Surgical History:  Procedure Laterality Date   APPLICATION OF WOUND VAC Left 12/20/2021   Procedure: APPLICATION OF WOUND VAC;  Surgeon: [name], MD;  Location: [place];  Service: Orthopedics;  Laterality: Left;   I & D EXTREMITY Left 12/20/2021   Procedure: EXCISIONAL DEBRIDEMENT HEMATOMA LEFT LEG;  Surgeon: [name], MD;  Location: [place];  Service: Orthopedics;  Laterality: Left;   NO PAST SURGERIES     RIGHT/LEFT HEART CATH AND CORONARY ANGIOGRAPHY N/A 10/06/2021   Procedure: RIGHT/LEFT HEART CATH AND CORONARY ANGIOGRAPHY;  Surgeon: Jettie Booze, MD;  Location: [place] [facility];  Service: Cardiovascular;  Laterality: N/A;    Current Medications: No outpatient medications have been marked as taking for the 04/17/22 encounter (Appointment) with Richardo Priest, MD.     Allergies:   Aspirin, Eggs or egg-derived products, and Influenza vaccines   Social History   Socioeconomic History   Marital status: Single    Spouse name: Not on file   Number of children: Not on file   Years of education: Not on file   Highest education level: Not on file  Occupational History   Occupation: Medical illustrator, work from home  Tobacco Use   Smoking status: Never    Passive exposure: Never   Smokeless tobacco: Never  Vaping Use   Vaping Use: Never used  Substance and Sexual Activity   Alcohol use: Not Currently  Comment: Maybe 2 glasses of wine a month   Drug use: Never   Sexual activity: Not on file  Other Topics Concern   Not on file  Social History Narrative   Not on file   Social Determinants of Health   Financial Resource Strain: Medium Risk (10/05/2021)   Overall Financial Resource Strain (CARDIA)    Difficulty of Paying Living Expenses: Somewhat hard  Food Insecurity: No Food Insecurity (10/05/2021)   Hunger Vital Sign    Worried About Running Out of Food in the Last Year: Never true    Ran Out of Food in the Last Year: Never true  Transportation Needs: No  Transportation Needs (10/05/2021)   PRAPARE - Hydrologist (Medical): No    Lack of Transportation (Non-Medical): No  Physical Activity: Not on file  Stress: Not on file  Social Connections: Not on file     Family History: The patient's ***family history includes Bone cancer in her father; Colon cancer in her maternal grandfather; Dementia in her paternal grandmother; Diabetes in her brother; Heart Problems in her mother; Heart attack in her sister; Hypercholesterolemia in her mother; Hypertension in her mother; Testicular cancer in her father. ROS:   Please see the history of present illness.    All other systems reviewed and are negative.  EKGs/Labs/Other Studies Reviewed:    The following studies were reviewed today:  EKG:  EKG ordered today and personally reviewed.  The ekg ordered today demonstrates ***  Recent Labs: 01/31/2022: B Natriuretic Peptide 42.2 02/13/2022: NT-Pro BNP 124 03/20/2022: ALT 12; BUN 20; Creatinine, Ser 0.89; Hemoglobin 12.0; Platelets 352.0; Potassium 4.6; Sodium 140  Recent Lipid Panel    Component Value Date/Time   CHOL 158 12/21/2019 1350   TRIG 58 12/21/2019 1350   HDL 58 12/21/2019 1350   LDLCALC 88 12/21/2019 1350    Physical Exam:    VS:  There were no vitals taken for this visit.    Wt Readings from Last 3 Encounters:  04/12/22 (!) 394 lb 6.4 oz (178.9 kg)  03/20/22 (!) 382 lb 6.4 oz (173.5 kg)  02/16/22 (!) 383 lb 3.2 oz (173.8 kg)     GEN: *** Well nourished, well developed in no acute distress HEENT: Normal NECK: No JVD; No carotid bruits LYMPHATICS: No lymphadenopathy CARDIAC: ***RRR, no murmurs, rubs, gallops RESPIRATORY:  Clear to auscultation without rales, wheezing or rhonchi  ABDOMEN: Soft, non-tender, non-distended MUSCULOSKELETAL:  No edema; No deformity  SKIN: Warm and dry NEUROLOGIC:  Alert and oriented x 3 PSYCHIATRIC:  Normal affect    Signed, Shirlee More, MD  04/16/2022 4:37 PM     [facility]

## 2022-04-17 ENCOUNTER — Ambulatory Visit: Payer: Commercial Managed Care - HMO | Attending: Cardiology | Admitting: Cardiology

## 2022-04-18 ENCOUNTER — Ambulatory Visit: Payer: Commercial Managed Care - HMO | Admitting: Family

## 2022-04-18 DIAGNOSIS — Z6841 Body Mass Index (BMI) 40.0 and over, adult: Secondary | ICD-10-CM

## 2022-04-18 DIAGNOSIS — S8012XA Contusion of left lower leg, initial encounter: Secondary | ICD-10-CM

## 2022-04-18 DIAGNOSIS — I87331 Chronic venous hypertension (idiopathic) with ulcer and inflammation of right lower extremity: Secondary | ICD-10-CM

## 2022-04-18 DIAGNOSIS — E66813 Obesity, class 3: Secondary | ICD-10-CM

## 2022-04-20 ENCOUNTER — Encounter: Payer: Self-pay | Admitting: Family

## 2022-04-20 NOTE — Progress Notes (Signed)
Office Visit Note   Patient: Christina Dominguez           Date of Birth: 1965-10-01           MRN: [id number] Visit Date: 04/18/2022              Requested by: Allwardt, Randa Evens, PA-C [place],  [place] [address] PCP: Fredirick Lathe, PA-C  Chief Complaint  Patient presents with   Right Leg - Wound Check, Follow-up       HPI: The patient is a 56 year old woman who presents in routine follow-up we have been doing serial Unna and Dynaflex compression wraps for the wound to the left lower extremity  Assessment & Plan: Visit Diagnoses: No diagnosis found.  Plan: Pleased with improvement serial compression. Wound nearly fully healed.  Placing compression stockings today.  Follow-up in 1 week for reevaluation  Follow-Up Instructions: No follow-ups on file.   Ortho Exam  Patient is alert, oriented, no adenopathy, well-dressed, normal affect, normal respiratory effort. On examination of the left leg there is good wrinkling of the skin up to the level where the wrap was in place her ulcer is nearly healed is now 1 cm x 1 mm.  there is bleeding granulation no erythema no odor  Imaging: No results found. No images are attached to the encounter.  Labs: Lab Results  Component Value Date   HGBA1C 5.9 (H) 08/20/2021   HGBA1C 6.0 (H) 03/22/2020   HGBA1C 6.0 (H) 12/21/2019   CRP 2.4 02/16/2022   CRP 1.6 (H) 10/09/2021   REPTSTATUS 12/25/2021 FINAL 12/20/2021   GRAMSTAIN NO WBC SEEN NO ORGANISMS SEEN  12/20/2021   CULT  12/20/2021    No growth aerobically or anaerobically. Performed at [facility], [place] [address], [place], [place] [address]      Lab Results  Component Value Date   ALBUMIN 4.3 03/20/2022   ALBUMIN 4.1 02/16/2022   ALBUMIN 3.8 10/03/2021    No results found for: "MG" Lab Results  Component Value Date   VD25OH 33.5 03/22/2020   VD25OH 28.6 (L) 12/21/2019    No results found for: "PREALBUMIN"    Latest Ref Rng & Units  03/20/2022    2:59 PM 02/16/2022    2:17 PM 01/31/2022    3:36 PM  CBC EXTENDED  WBC 4.0 - 10.5 K/uL 8.5  7.8  9.1   RBC 3.87 - 5.11 Mil/uL 4.04  3.76  3.83   Hemoglobin 12.0 - 15.0 g/dL 12.0  11.5  11.4   HCT 36.0 - 46.0 % 37.1  34.6  36.6   Platelets 150.0 - 400.0 K/uL 352.0  322.0  323   NEUT# 1.4 - 7.7 K/uL 6.5  6.0    Lymph# 0.7 - 4.0 K/uL 1.2  1.1       There is no height or weight on file to calculate BMI.  Orders:  No orders of the defined types were placed in this encounter.  No orders of the defined types were placed in this encounter.    Procedures: No procedures performed  Clinical Data: No additional findings.  ROS:  All other systems negative, except as noted in the HPI. Review of Systems  Objective: Vital Signs: There were no vitals taken for this visit.  Specialty Comments:  No specialty comments available.  PMFS History: Patient Active Problem List   Diagnosis Date Noted   Arthritis 04/16/2022   Asthma 04/16/2022   Hematoma of left  lower leg    Wheezing 11/23/2021   Snoring 11/23/2021   Other secondary pulmonary hypertension ([place]) 11/23/2021   Hyponatremia 10/11/2021   Dyspnea on exertion    Dyslipidemia 10/04/2021   Mediastinal mass 10/04/2021   Obstructive sleep apnea 08/25/2021   RUQ pain 08/19/2021   Hepatic steatosis 08/19/2021   Hepatomegaly 08/19/2021   Aortic atherosclerosis ([place]) 08/19/2021   Diverticulosis 08/19/2021   Anxiety 08/02/2021   Bipolar 1 disorder ([place]) 08/02/2021   CHF (congestive heart failure) ([place]) 08/02/2021   Depression 08/02/2021   Joint pain 08/02/2021   Screening for malignant neoplasm of colon 81/19/1478   Diastolic dysfunction 29/56/2130   Prediabetes 12/22/2019   Vitamin D insufficiency 12/22/2019   Class 3 severe obesity with serious comorbidity and body mass index (BMI) greater than or equal to 70 in adult [facility]) 12/21/2019   Essential hypertension 04/30/2019   Mild intermittent asthma without  complication 86/57/8469   Past Medical History:  Diagnosis Date   Anxiety    Aortic atherosclerosis ([place]) 08/19/2021   Arthritis    Asthma    Bipolar 1 disorder (HCC)    Chronic diastolic CHF (congestive heart failure) (HCC)    Class 3 severe obesity with serious comorbidity and body mass index (BMI) greater than or equal to 70 in adult ([place]) 12/21/2019   Depression    Diastolic dysfunction 62/95/2841   Diverticulosis 08/19/2021   Edema of both lower extremities    Hepatic steatosis 08/19/2021   Hepatomegaly 08/19/2021   High blood pressure    Hyperlipidemia    Joint pain    Morbid obesity ([place])    Pre-diabetes     Family History  Problem Relation Age of Onset   Heart Problems Mother        Broken heart syndrome   Hypertension Mother    Hypercholesterolemia Mother    Bone cancer Father    Testicular cancer Father    Heart attack Sister    Diabetes Brother    Colon cancer Maternal Grandfather    Dementia Paternal Grandmother     Past Surgical History:  Procedure Laterality Date   APPLICATION OF WOUND VAC Left 12/20/2021   Procedure: APPLICATION OF WOUND VAC;  Surgeon: [name], MD;  Location: [place];  Service: Orthopedics;  Laterality: Left;   I & D EXTREMITY Left 12/20/2021   Procedure: EXCISIONAL DEBRIDEMENT HEMATOMA LEFT LEG;  Surgeon: [name], MD;  Location: [place];  Service: Orthopedics;  Laterality: Left;   NO PAST SURGERIES     RIGHT/LEFT HEART CATH AND CORONARY ANGIOGRAPHY N/A 10/06/2021   Procedure: RIGHT/LEFT HEART CATH AND CORONARY ANGIOGRAPHY;  Surgeon: Jettie Booze, MD;  Location: [place] [facility];  Service: Cardiovascular;  Laterality: N/A;   Social History   Occupational History   Occupation: Medical illustrator, work from home  Tobacco Use   Smoking status: Never    Passive exposure: Never   Smokeless tobacco: Never  Vaping Use   Vaping Use: Never used  Substance and Sexual Activity   Alcohol use: Not Currently    Comment: Maybe 2  glasses of wine a month   Drug use: Never   Sexual activity: Not on file

## 2022-04-25 ENCOUNTER — Ambulatory Visit (INDEPENDENT_AMBULATORY_CARE_PROVIDER_SITE_OTHER): Payer: Commercial Managed Care - HMO | Admitting: Family

## 2022-04-25 DIAGNOSIS — E66813 Obesity, class 3: Secondary | ICD-10-CM

## 2022-04-25 DIAGNOSIS — I890 Lymphedema, not elsewhere classified: Secondary | ICD-10-CM

## 2022-04-25 DIAGNOSIS — Z6841 Body Mass Index (BMI) 40.0 and over, adult: Secondary | ICD-10-CM

## 2022-04-25 DIAGNOSIS — S8012XA Contusion of left lower leg, initial encounter: Secondary | ICD-10-CM

## 2022-04-25 DIAGNOSIS — I87331 Chronic venous hypertension (idiopathic) with ulcer and inflammation of right lower extremity: Secondary | ICD-10-CM

## 2022-04-25 NOTE — Progress Notes (Signed)
Post-Op Visit Note   Patient: Christina Dominguez           Date of Birth: [birth year]           MRN: [id number] Visit Date: 12/29/2021 PCP: Fredirick Lathe, PA-C  Chief Complaint:  Chief Complaint  Patient presents with   Left Leg - Routine Post Op    12/20/2021 EXCISION LEFT LEG HEMATOMA     HPI:  HPI The patient is a 55 year old woman seen 1 week status post excision left lower leg hematoma. Ortho Exam On examination of the left lower leg her incision is well approximated with sutures.  Moderate edema to the left lower extremity.  There is scant drainage today there is no erythema no warmth  Visit Diagnoses: No diagnosis found.  Plan: We will apply Dynaflex compression wrap concern for her edema.  We will follow-up in the office in 1 week for reevaluation  Follow-Up Instructions: No follow-ups on file.   Imaging: No results found.  Orders:  No orders of the defined types were placed in this encounter.  No orders of the defined types were placed in this encounter.    PMFS History: Patient Active Problem List   Diagnosis Date Noted   Arthritis 04/16/2022   Asthma 04/16/2022   Hematoma of left lower leg    Wheezing 11/23/2021   Snoring 11/23/2021   Other secondary pulmonary hypertension ([place]) 11/23/2021   Hyponatremia 10/11/2021   Dyspnea on exertion    Dyslipidemia 10/04/2021   Mediastinal mass 10/04/2021   Obstructive sleep apnea 08/25/2021   RUQ pain 08/19/2021   Hepatic steatosis 08/19/2021   Hepatomegaly 08/19/2021   Aortic atherosclerosis ([place]) 08/19/2021   Diverticulosis 08/19/2021   Anxiety 08/02/2021   Bipolar 1 disorder ([place]) 08/02/2021   CHF (congestive heart failure) ([place]) 08/02/2021   Depression 08/02/2021   Joint pain 08/02/2021   Screening for malignant neoplasm of colon 11/91/4782   Diastolic dysfunction 95/62/1308   Prediabetes 12/22/2019   Vitamin D insufficiency 12/22/2019   Class 3 severe obesity with serious comorbidity and body  mass index (BMI) greater than or equal to 70 in adult ([place]) 12/21/2019   Essential hypertension 04/30/2019   Mild intermittent asthma without complication 65/78/4696   Past Medical History:  Diagnosis Date   Anxiety    Aortic atherosclerosis ([place]) 08/19/2021   Arthritis    Asthma    Bipolar 1 disorder (HCC)    Chronic diastolic CHF (congestive heart failure) (HCC)    Class 3 severe obesity with serious comorbidity and body mass index (BMI) greater than or equal to 70 in adult ([place]) 12/21/2019   Depression    Diastolic dysfunction 29/52/8413   Diverticulosis 08/19/2021   Edema of both lower extremities    Hepatic steatosis 08/19/2021   Hepatomegaly 08/19/2021   High blood pressure    Hyperlipidemia    Joint pain    Morbid obesity ([place])    Pre-diabetes     Family History  Problem Relation Age of Onset   Heart Problems Mother        Broken heart syndrome   Hypertension Mother    Hypercholesterolemia Mother    Bone cancer Father    Testicular cancer Father    Heart attack Sister    Diabetes Brother    Colon cancer Maternal Grandfather    Dementia Paternal Grandmother     Past Surgical History:  Procedure Laterality Date   APPLICATION OF WOUND VAC Left 12/20/2021   Procedure: APPLICATION OF  WOUND VAC;  Surgeon: [name], MD;  Location: [facility];  Service: Orthopedics;  Laterality: Left;   I & D EXTREMITY Left 12/20/2021   Procedure: EXCISIONAL DEBRIDEMENT HEMATOMA LEFT LEG;  Surgeon: [name], MD;  Location: [facility];  Service: Orthopedics;  Laterality: Left;   NO PAST SURGERIES     RIGHT/LEFT HEART CATH AND CORONARY ANGIOGRAPHY N/A 10/06/2021   Procedure: RIGHT/LEFT HEART CATH AND CORONARY ANGIOGRAPHY;  Surgeon: Jettie Booze, MD;  Location: [facility];  Service: Cardiovascular;  Laterality: N/A;   Social History   Occupational History   Occupation: Medical illustrator, work from home  Tobacco Use   Smoking status: Never    Passive exposure: Never    Smokeless tobacco: Never  Vaping Use   Vaping Use: Never used  Substance and Sexual Activity   Alcohol use: Not Currently    Comment: Maybe 2 glasses of wine a month   Drug use: Never   Sexual activity: Not on file

## 2022-04-25 NOTE — Progress Notes (Signed)
Post-Op Visit Note   Patient: Christina Dominguez           Date of Birth: [birth year]           MRN: [id number] Visit Date: 04/25/2022 PCP: Fredirick Lathe, PA-C  Chief Complaint:  Chief Complaint  Patient presents with   Right Leg - Wound Check, Follow-up    HPI:  HPI The patient is a 56 year old woman who presents in follow-up for hematoma which was irrigated debrided a few months ago this has been slow to heal she has been in serial compression for quite some time has been doing dry dressings for the last 1 week  Has not been using her compression garments nor her lymphedema pumps  Ortho Exam On examination of the left lower extremity the ulcer is nearly healed there is a 2 mm in diameter open area with 1 drop of serous drainage there is no depth no erythema no odor no warmth  Visit Diagnoses: No diagnosis found.  Plan: She will continue with her current wound care.  Discussed the importance of compression and encouraged her to use her lymphedema pumps we will follow-up with her once more in 3 weeks hope to release her at that time  Follow-Up Instructions: No follow-ups on file.   Imaging: No results found.  Orders:  No orders of the defined types were placed in this encounter.  No orders of the defined types were placed in this encounter.    PMFS History: Patient Active Problem List   Diagnosis Date Noted   Arthritis 04/16/2022   Asthma 04/16/2022   Hematoma of left lower leg    Wheezing 11/23/2021   Snoring 11/23/2021   Other secondary pulmonary hypertension ([place]) 11/23/2021   Hyponatremia 10/11/2021   Dyspnea on exertion    Dyslipidemia 10/04/2021   Mediastinal mass 10/04/2021   Obstructive sleep apnea 08/25/2021   RUQ pain 08/19/2021   Hepatic steatosis 08/19/2021   Hepatomegaly 08/19/2021   Aortic atherosclerosis ([place]) 08/19/2021   Diverticulosis 08/19/2021   Anxiety 08/02/2021   Bipolar 1 disorder ([place]) 08/02/2021   CHF (congestive heart  failure) ([place]) 08/02/2021   Depression 08/02/2021   Joint pain 08/02/2021   Screening for malignant neoplasm of colon 74/16/3845   Diastolic dysfunction 36/46/8032   Prediabetes 12/22/2019   Vitamin D insufficiency 12/22/2019   Class 3 severe obesity with serious comorbidity and body mass index (BMI) greater than or equal to 70 in adult ([place]) 12/21/2019   Essential hypertension 04/30/2019   Mild intermittent asthma without complication 07/29/8249   Past Medical History:  Diagnosis Date   Anxiety    Aortic atherosclerosis ([place]) 08/19/2021   Arthritis    Asthma    Bipolar 1 disorder (HCC)    Chronic diastolic CHF (congestive heart failure) (HCC)    Class 3 severe obesity with serious comorbidity and body mass index (BMI) greater than or equal to 70 in adult ([place]) 12/21/2019   Depression    Diastolic dysfunction 03/70/4888   Diverticulosis 08/19/2021   Edema of both lower extremities    Hepatic steatosis 08/19/2021   Hepatomegaly 08/19/2021   High blood pressure    Hyperlipidemia    Joint pain    Morbid obesity ([place])    Pre-diabetes     Family History  Problem Relation Age of Onset   Heart Problems Mother        Broken heart syndrome   Hypertension Mother    Hypercholesterolemia Mother    Bone cancer Father  Testicular cancer Father    Heart attack Sister    Diabetes Brother    Colon cancer Maternal Grandfather    Dementia Paternal Grandmother     Past Surgical History:  Procedure Laterality Date   APPLICATION OF WOUND VAC Left 12/20/2021   Procedure: APPLICATION OF WOUND VAC;  Surgeon: [name], MD;  Location: [facility];  Service: Orthopedics;  Laterality: Left;   I & D EXTREMITY Left 12/20/2021   Procedure: EXCISIONAL DEBRIDEMENT HEMATOMA LEFT LEG;  Surgeon: [name], MD;  Location: [facility];  Service: Orthopedics;  Laterality: Left;   NO PAST SURGERIES     RIGHT/LEFT HEART CATH AND CORONARY ANGIOGRAPHY N/A 10/06/2021   Procedure: RIGHT/LEFT HEART CATH AND  CORONARY ANGIOGRAPHY;  Surgeon: Jettie Booze, MD;  Location: [facility];  Service: Cardiovascular;  Laterality: N/A;   Social History   Occupational History   Occupation: Medical illustrator, work from home  Tobacco Use   Smoking status: Never    Passive exposure: Never   Smokeless tobacco: Never  Vaping Use   Vaping Use: Never used  Substance and Sexual Activity   Alcohol use: Not Currently    Comment: Maybe 2 glasses of wine a month   Drug use: Never   Sexual activity: Not on file

## 2022-04-30 ENCOUNTER — Encounter: Payer: Self-pay | Admitting: *Deleted

## 2022-05-16 ENCOUNTER — Ambulatory Visit: Payer: Commercial Managed Care - HMO

## 2022-05-16 ENCOUNTER — Ambulatory Visit (INDEPENDENT_AMBULATORY_CARE_PROVIDER_SITE_OTHER): Payer: Commercial Managed Care - HMO | Admitting: Family

## 2022-05-16 DIAGNOSIS — I890 Lymphedema, not elsewhere classified: Secondary | ICD-10-CM

## 2022-05-16 DIAGNOSIS — S8012XA Contusion of left lower leg, initial encounter: Secondary | ICD-10-CM

## 2022-05-16 DIAGNOSIS — I87331 Chronic venous hypertension (idiopathic) with ulcer and inflammation of right lower extremity: Secondary | ICD-10-CM

## 2022-05-23 ENCOUNTER — Ambulatory Visit: Payer: Commercial Managed Care - HMO

## 2022-05-23 DIAGNOSIS — S8012XA Contusion of left lower leg, initial encounter: Secondary | ICD-10-CM

## 2022-05-23 DIAGNOSIS — I890 Lymphedema, not elsewhere classified: Secondary | ICD-10-CM

## 2022-05-23 NOTE — Progress Notes (Signed)
The patient is 5 months out from an excisional debridement of a hematoma of the left leg here today for her weekly compression dressing change. The dressing has moved slightly down her leg. She does not have any complaints of pain. The portion of her leg that remained covered by the dressing has good wrinkling to the skin. Another unna and Profore dressing was applied to the limb. The pt has been visiting with her aunt this week and did not bring her lymphedema pumps with her but did say that she will begin using them now that she is home.  the pt will follow up in the office in one week with Erin and call with any questions or concerns.   Furqan Gosselin, [place], [employer]

## 2022-05-30 ENCOUNTER — Encounter: Payer: Self-pay | Admitting: Family

## 2022-05-30 ENCOUNTER — Ambulatory Visit (INDEPENDENT_AMBULATORY_CARE_PROVIDER_SITE_OTHER): Payer: Commercial Managed Care - HMO | Admitting: Family

## 2022-05-30 DIAGNOSIS — S8012XA Contusion of left lower leg, initial encounter: Secondary | ICD-10-CM

## 2022-05-30 NOTE — Progress Notes (Signed)
Post-Op Visit Note   Patient: Christina Dominguez           Date of Birth: 09/25/1965           MRN: [id number] Visit Date: 05/16/2022 PCP: Fredirick Lathe, PA-C  Chief Complaint:  Chief Complaint  Patient presents with   Right Leg - Wound Check    HPI:  HPI The patient is a 56 year old woman who presents in follow-up for hematoma which was irrigated debrided months ago this has been slow to heal   she had been in serial compression for quite some time has been doing dry dressings for the last 1 week.  Unfortunately outside of the compression wraps she began to have breakdown of her wound.  Has not been using her compression garments nor her lymphedema pumps  Ortho Exam On examination of the left lower extremity the ulcer is now 20 mm x 5 mm with fibrinous exudate tissue and 100% of the wound bed there is no surrounding erythema or warmth there is no depth, odor   Visit Diagnoses:  1. Hematoma of left lower leg   2. Chronic venous hypertension (idiopathic) with ulcer and inflammation of right lower extremity (HCC)   3. Lymphedema of both lower extremities     Plan: We will place her back in a Dynaflex compression wrap with silver cell over the wound.  She will follow-up in 1 week.   Follow-Up Instructions: No follow-ups on file.   Imaging: No results found.  Orders:  No orders of the defined types were placed in this encounter.  No orders of the defined types were placed in this encounter.    PMFS History: Patient Active Problem List   Diagnosis Date Noted   Arthritis 04/16/2022   Asthma 04/16/2022   Hematoma of left lower leg    Wheezing 11/23/2021   Snoring 11/23/2021   Other secondary pulmonary hypertension ([place]) 11/23/2021   Hyponatremia 10/11/2021   Dyspnea on exertion    Dyslipidemia 10/04/2021   Mediastinal mass 10/04/2021   Obstructive sleep apnea 08/25/2021   RUQ pain 08/19/2021   Hepatic steatosis 08/19/2021   Hepatomegaly 08/19/2021    Aortic atherosclerosis ([place]) 08/19/2021   Diverticulosis 08/19/2021   Anxiety 08/02/2021   Bipolar 1 disorder ([place]) 08/02/2021   CHF (congestive heart failure) ([place]) 08/02/2021   Depression 08/02/2021   Joint pain 08/02/2021   Screening for malignant neoplasm of colon 53/97/6734   Diastolic dysfunction 19/37/9024   Prediabetes 12/22/2019   Vitamin D insufficiency 12/22/2019   Class 3 severe obesity with serious comorbidity and body mass index (BMI) greater than or equal to 70 in adult ([place]) 12/21/2019   Essential hypertension 04/30/2019   Mild intermittent asthma without complication 09/73/5329   Past Medical History:  Diagnosis Date   Anxiety    Aortic atherosclerosis ([place]) 08/19/2021   Arthritis    Asthma    Bipolar 1 disorder (HCC)    Chronic diastolic CHF (congestive heart failure) (HCC)    Class 3 severe obesity with serious comorbidity and body mass index (BMI) greater than or equal to 70 in adult ([place]) 12/21/2019   Depression    Diastolic dysfunction 92/42/6834   Diverticulosis 08/19/2021   Edema of both lower extremities    Hepatic steatosis 08/19/2021   Hepatomegaly 08/19/2021   High blood pressure    Hyperlipidemia    Joint pain    Morbid obesity ([place])    Pre-diabetes     Family History  Problem Relation Age  of Onset   Heart Problems Mother        Broken heart syndrome   Hypertension Mother    Hypercholesterolemia Mother    Bone cancer Father    Testicular cancer Father    Heart attack Sister    Diabetes Brother    Colon cancer Maternal Grandfather    Dementia Paternal Grandmother     Past Surgical History:  Procedure Laterality Date   APPLICATION OF WOUND VAC Left 12/20/2021   Procedure: APPLICATION OF WOUND VAC;  Surgeon: [name], MD;  Location: [facility];  Service: Orthopedics;  Laterality: Left;   I & D EXTREMITY Left 12/20/2021   Procedure: EXCISIONAL DEBRIDEMENT HEMATOMA LEFT LEG;  Surgeon: [name], MD;  Location: [facility];  Service:  Orthopedics;  Laterality: Left;   NO PAST SURGERIES     RIGHT/LEFT HEART CATH AND CORONARY ANGIOGRAPHY N/A 10/06/2021   Procedure: RIGHT/LEFT HEART CATH AND CORONARY ANGIOGRAPHY;  Surgeon: Jettie Booze, MD;  Location: [facility];  Service: Cardiovascular;  Laterality: N/A;   Social History   Occupational History   Occupation: Medical illustrator, work from home  Tobacco Use   Smoking status: Never    Passive exposure: Never   Smokeless tobacco: Never  Vaping Use   Vaping Use: Never used  Substance and Sexual Activity   Alcohol use: Not Currently    Comment: Maybe 2 glasses of wine a month   Drug use: Never   Sexual activity: Not on file

## 2022-06-01 ENCOUNTER — Encounter: Payer: Self-pay | Admitting: Family

## 2022-06-01 NOTE — Progress Notes (Signed)
Office Visit Note   Patient: Christina Dominguez           Date of Birth: 06/29/1966           MRN: [id number] Visit Date: 05/30/2022              Requested by: Allwardt, Randa Evens, PA-C [place],  [place] [address] PCP: Fredirick Lathe, PA-C  Chief Complaint  Patient presents with   Left Leg - Wound Check      HPI: Patient is a 56 year old woman seen in follow-up status post left leg excision of hematoma in May we have been doing serial compression wraps again for the last 1 week. marked improvement  Assessment & Plan: Visit Diagnoses: No diagnosis found.  Plan: Pleased with the improvement will continue with current plan Unna and Dynaflex with silver cell over the wound follow-up in 1 week  Follow-Up Instructions: No follow-ups on file.   Ortho Exam  Patient is alert, oriented, no adenopathy, well-dressed, normal affect, normal respiratory effort. On examination of the left lower leg there is wrinkling of the skin and reduced edema underneath the area that was wrapped with compression there is an ulcer which is now measuring 2 cm x 5 mm please see attached photo there is full granulation no surrounding erythema no odor  Imaging: No results found.    Labs: Lab Results  Component Value Date   HGBA1C 5.9 (H) 08/20/2021   HGBA1C 6.0 (H) 03/22/2020   HGBA1C 6.0 (H) 12/21/2019   CRP 2.4 02/16/2022   CRP 1.6 (H) 10/09/2021   REPTSTATUS 12/25/2021 FINAL 12/20/2021   GRAMSTAIN NO WBC SEEN NO ORGANISMS SEEN  12/20/2021   CULT  12/20/2021    No growth aerobically or anaerobically. Performed at [facility], [place] [address] [place],  [address]      Lab Results  Component Value Date   ALBUMIN 4.3 03/20/2022   ALBUMIN 4.1 02/16/2022   ALBUMIN 3.8 10/03/2021    No results found for: "MG" Lab Results  Component Value Date   VD25OH 33.5 03/22/2020   VD25OH 28.6 (L) 12/21/2019    No results found for: "PREALBUMIN"    Latest Ref  Rng & Units 03/20/2022    2:59 PM 02/16/2022    2:17 PM 01/31/2022    3:36 PM  CBC EXTENDED  WBC 4.0 - 10.5 K/uL 8.5  7.8  9.1   RBC 3.87 - 5.11 Mil/uL 4.04  3.76  3.83   Hemoglobin 12.0 - 15.0 g/dL 12.0  11.5  11.4   HCT 36.0 - 46.0 % 37.1  34.6  36.6   Platelets 150.0 - 400.0 K/uL 352.0  322.0  323   NEUT# 1.4 - 7.7 K/uL 6.5  6.0    Lymph# 0.7 - 4.0 K/uL 1.2  1.1       There is no height or weight on file to calculate BMI.  Orders:  No orders of the defined types were placed in this encounter.  No orders of the defined types were placed in this encounter.    Procedures: No procedures performed  Clinical Data: No additional findings.  ROS:  All other systems negative, except as noted in the HPI. Review of Systems  Objective: Vital Signs: There were no vitals taken for this visit.  Specialty Comments:  No specialty comments available.  PMFS History: Patient Active Problem List   Diagnosis Date Noted   Arthritis 04/16/2022   Asthma 04/16/2022  Hematoma of left lower leg    Wheezing 11/23/2021   Snoring 11/23/2021   Other secondary pulmonary hypertension ([place]) 11/23/2021   Hyponatremia 10/11/2021   Dyspnea on exertion    Dyslipidemia 10/04/2021   Mediastinal mass 10/04/2021   Obstructive sleep apnea 08/25/2021   RUQ pain 08/19/2021   Hepatic steatosis 08/19/2021   Hepatomegaly 08/19/2021   Aortic atherosclerosis ([place]) 08/19/2021   Diverticulosis 08/19/2021   Anxiety 08/02/2021   Bipolar 1 disorder ([place]) 08/02/2021   CHF (congestive heart failure) ([place]) 08/02/2021   Depression 08/02/2021   Joint pain 08/02/2021   Screening for malignant neoplasm of colon 79/39/0300   Diastolic dysfunction 92/33/0076   Prediabetes 12/22/2019   Vitamin D insufficiency 12/22/2019   Class 3 severe obesity with serious comorbidity and body mass index (BMI) greater than or equal to 70 in adult [facility]) 12/21/2019   Essential hypertension 04/30/2019   Mild intermittent asthma  without complication 22/63/3354   Past Medical History:  Diagnosis Date   Anxiety    Aortic atherosclerosis ([place]) 08/19/2021   Arthritis    Asthma    Bipolar 1 disorder (HCC)    Chronic diastolic CHF (congestive heart failure) (HCC)    Class 3 severe obesity with serious comorbidity and body mass index (BMI) greater than or equal to 70 in adult ([place]) 12/21/2019   Depression    Diastolic dysfunction 56/25/6389   Diverticulosis 08/19/2021   Edema of both lower extremities    Hepatic steatosis 08/19/2021   Hepatomegaly 08/19/2021   High blood pressure    Hyperlipidemia    Joint pain    Morbid obesity ([place])    Pre-diabetes     Family History  Problem Relation Age of Onset   Heart Problems Mother        Broken heart syndrome   Hypertension Mother    Hypercholesterolemia Mother    Bone cancer Father    Testicular cancer Father    Heart attack Sister    Diabetes Brother    Colon cancer Maternal Grandfather    Dementia Paternal Grandmother     Past Surgical History:  Procedure Laterality Date   APPLICATION OF WOUND VAC Left 12/20/2021   Procedure: APPLICATION OF WOUND VAC;  Surgeon: [name], MD;  Location: [facility];  Service: Orthopedics;  Laterality: Left;   I & D EXTREMITY Left 12/20/2021   Procedure: EXCISIONAL DEBRIDEMENT HEMATOMA LEFT LEG;  Surgeon: [name], MD;  Location: [facility];  Service: Orthopedics;  Laterality: Left;   NO PAST SURGERIES     RIGHT/LEFT HEART CATH AND CORONARY ANGIOGRAPHY N/A 10/06/2021   Procedure: RIGHT/LEFT HEART CATH AND CORONARY ANGIOGRAPHY;  Surgeon: Jettie Booze, MD;  Location: [facility];  Service: Cardiovascular;  Laterality: N/A;   Social History   Occupational History   Occupation: Medical illustrator, work from home  Tobacco Use   Smoking status: Never    Passive exposure: Never   Smokeless tobacco: Never  Vaping Use   Vaping Use: Never used  Substance and Sexual Activity   Alcohol use: Not Currently    Comment:  Maybe 2 glasses of wine a month   Drug use: Never   Sexual activity: Not on file

## 2022-06-06 ENCOUNTER — Ambulatory Visit (INDEPENDENT_AMBULATORY_CARE_PROVIDER_SITE_OTHER): Payer: Commercial Managed Care - HMO | Admitting: Family

## 2022-06-06 ENCOUNTER — Encounter: Payer: Self-pay | Admitting: Family

## 2022-06-06 DIAGNOSIS — S8012XA Contusion of left lower leg, initial encounter: Secondary | ICD-10-CM | POA: Diagnosis not present

## 2022-06-06 NOTE — Progress Notes (Signed)
Office Visit Note   Patient: Christina Dominguez           Date of Birth: 1966/02/28           MRN: [id number] Visit Date: 06/06/2022              Requested by: Allwardt, Randa Evens, PA-C [place],  [place] [address] PCP: Fredirick Lathe, PA-C  No chief complaint on file.     HPI: Patient is a 56 year old woman seen in follow-up status post left leg excision of hematoma in May we have been doing serial compression wraps again for the last 1 week. Continues to show improvement  Assessment & Plan: Visit Diagnoses: No diagnosis found.  Plan: Pleased with the improvement will continue with current plan Unna and Dynaflex with silver cell over the wound follow-up in 1 week. Hope to release at that time.  Follow-Up Instructions: Return in about 1 week (around 06/13/2022).   Ortho Exam  Patient is alert, oriented, no adenopathy, well-dressed, normal affect, normal respiratory effort. On examination of the left lower leg there is wrinkling of the skin and reduced edema underneath the area that was wrapped with compression there is an ulcer which is now measuring 1.5 cm x 5 mm  there is full granulation no surrounding erythema no odor  Imaging: No results found.    Labs: Lab Results  Component Value Date   HGBA1C 5.9 (H) 08/20/2021   HGBA1C 6.0 (H) 03/22/2020   HGBA1C 6.0 (H) 12/21/2019   CRP 2.4 02/16/2022   CRP 1.6 (H) 10/09/2021   REPTSTATUS 12/25/2021 FINAL 12/20/2021   GRAMSTAIN NO WBC SEEN NO ORGANISMS SEEN  12/20/2021   CULT  12/20/2021    No growth aerobically or anaerobically. Performed at [facility], [place] [address] [place], [place] [address]      Lab Results  Component Value Date   ALBUMIN 4.3 03/20/2022   ALBUMIN 4.1 02/16/2022   ALBUMIN 3.8 10/03/2021    No results found for: "MG" Lab Results  Component Value Date   VD25OH 33.5 03/22/2020   VD25OH 28.6 (L) 12/21/2019    No results found for: "PREALBUMIN"    Latest Ref  Rng & Units 03/20/2022    2:59 PM 02/16/2022    2:17 PM 01/31/2022    3:36 PM  CBC EXTENDED  WBC 4.0 - 10.5 K/uL 8.5  7.8  9.1   RBC 3.87 - 5.11 Mil/uL 4.04  3.76  3.83   Hemoglobin 12.0 - 15.0 g/dL 12.0  11.5  11.4   HCT 36.0 - 46.0 % 37.1  34.6  36.6   Platelets 150.0 - 400.0 K/uL 352.0  322.0  323   NEUT# 1.4 - 7.7 K/uL 6.5  6.0    Lymph# 0.7 - 4.0 K/uL 1.2  1.1       There is no height or weight on file to calculate BMI.  Orders:  No orders of the defined types were placed in this encounter.  No orders of the defined types were placed in this encounter.    Procedures: No procedures performed  Clinical Data: No additional findings.  ROS:  All other systems negative, except as noted in the HPI. Review of Systems  Objective: Vital Signs: There were no vitals taken for this visit.  Specialty Comments:  No specialty comments available.  PMFS History: Patient Active Problem List   Diagnosis Date Noted   Arthritis 04/16/2022   Asthma 04/16/2022   Hematoma  of left lower leg    Wheezing 11/23/2021   Snoring 11/23/2021   Other secondary pulmonary hypertension ([place]) 11/23/2021   Hyponatremia 10/11/2021   Dyspnea on exertion    Dyslipidemia 10/04/2021   Mediastinal mass 10/04/2021   Obstructive sleep apnea 08/25/2021   RUQ pain 08/19/2021   Hepatic steatosis 08/19/2021   Hepatomegaly 08/19/2021   Aortic atherosclerosis ([place]) 08/19/2021   Diverticulosis 08/19/2021   Anxiety 08/02/2021   Bipolar 1 disorder ([place]) 08/02/2021   CHF (congestive heart failure) ([place]) 08/02/2021   Depression 08/02/2021   Joint pain 08/02/2021   Screening for malignant neoplasm of colon 95/04/3266   Diastolic dysfunction 12/45/8099   Prediabetes 12/22/2019   Vitamin D insufficiency 12/22/2019   Class 3 severe obesity with serious comorbidity and body mass index (BMI) greater than or equal to 70 in adult [facility]) 12/21/2019   Essential hypertension 04/30/2019   Mild intermittent asthma  without complication 83/38/2505   Past Medical History:  Diagnosis Date   Anxiety    Aortic atherosclerosis ([place]) 08/19/2021   Arthritis    Asthma    Bipolar 1 disorder (HCC)    Chronic diastolic CHF (congestive heart failure) (HCC)    Class 3 severe obesity with serious comorbidity and body mass index (BMI) greater than or equal to 70 in adult ([place]) 12/21/2019   Depression    Diastolic dysfunction 39/76/7341   Diverticulosis 08/19/2021   Edema of both lower extremities    Hepatic steatosis 08/19/2021   Hepatomegaly 08/19/2021   High blood pressure    Hyperlipidemia    Joint pain    Morbid obesity ([place])    Pre-diabetes     Family History  Problem Relation Age of Onset   Heart Problems Mother        Broken heart syndrome   Hypertension Mother    Hypercholesterolemia Mother    Bone cancer Father    Testicular cancer Father    Heart attack Sister    Diabetes Brother    Colon cancer Maternal Grandfather    Dementia Paternal Grandmother     Past Surgical History:  Procedure Laterality Date   APPLICATION OF WOUND VAC Left 12/20/2021   Procedure: APPLICATION OF WOUND VAC;  Surgeon: [name], MD;  Location: [place];  Service: Orthopedics;  Laterality: Left;   I & D EXTREMITY Left 12/20/2021   Procedure: EXCISIONAL DEBRIDEMENT HEMATOMA LEFT LEG;  Surgeon: [name], MD;  Location: [place];  Service: Orthopedics;  Laterality: Left;   NO PAST SURGERIES     RIGHT/LEFT HEART CATH AND CORONARY ANGIOGRAPHY N/A 10/06/2021   Procedure: RIGHT/LEFT HEART CATH AND CORONARY ANGIOGRAPHY;  Surgeon: Jettie Booze, MD;  Location: [place] [facility];  Service: Cardiovascular;  Laterality: N/A;   Social History   Occupational History   Occupation: Medical illustrator, work from home  Tobacco Use   Smoking status: Never    Passive exposure: Never   Smokeless tobacco: Never  Vaping Use   Vaping Use: Never used  Substance and Sexual Activity   Alcohol use: Not Currently    Comment:  Maybe 2 glasses of wine a month   Drug use: Never   Sexual activity: Not on file

## 2022-06-13 ENCOUNTER — Ambulatory Visit (INDEPENDENT_AMBULATORY_CARE_PROVIDER_SITE_OTHER): Payer: Commercial Managed Care - HMO | Admitting: Family

## 2022-06-13 ENCOUNTER — Encounter: Payer: Self-pay | Admitting: Family

## 2022-06-13 DIAGNOSIS — S8012XA Contusion of left lower leg, initial encounter: Secondary | ICD-10-CM | POA: Diagnosis not present

## 2022-06-13 DIAGNOSIS — I87331 Chronic venous hypertension (idiopathic) with ulcer and inflammation of right lower extremity: Secondary | ICD-10-CM

## 2022-06-13 NOTE — Progress Notes (Signed)
Office Visit Note   Patient: Christina Dominguez           Date of Birth: [birth year]           MRN: [id number] Visit Date: 06/13/2022              Requested by: Allwardt, Randa Evens, PA-C [place],  [place] [address] PCP: Fredirick Lathe, PA-C  Chief Complaint  Patient presents with   Right Leg - Wound Check      HPI: The patient is a 56 year old woman seen in follow-up for a wound to the left lower extremity we have been doing serial Unna and Dynaflex compression wraps for several weeks now.  She has no new concerns. Assessment & Plan: Visit Diagnoses: No diagnosis found.  Plan: She will resume home compression and dry dressings elevate for swelling as well compression around-the-clock she will follow-up in 1 wee Follow-Up Instructions: No follow-ups on file.   Ortho Exam  Patient is alert, oriented, no adenopathy, well-dressed, normal affect, normal respiratory effort. On examination of the left lower extremity there is good wrinkling of the skin from compression she has 1 pinpoint area with 1 drop of blood overall the wound has healed quite well there is no erythema drainage no sign of infection  Imaging: No results found.   Labs: Lab Results  Component Value Date   HGBA1C 5.9 (H) 08/20/2021   HGBA1C 6.0 (H) 03/22/2020   HGBA1C 6.0 (H) 12/21/2019   CRP 2.4 02/16/2022   CRP 1.6 (H) 10/09/2021   REPTSTATUS 12/25/2021 FINAL 12/20/2021   GRAMSTAIN NO WBC SEEN NO ORGANISMS SEEN  12/20/2021   CULT  12/20/2021    No growth aerobically or anaerobically. Performed at [facility], [place] [address] [place], [place] [address]      Lab Results  Component Value Date   ALBUMIN 4.3 03/20/2022   ALBUMIN 4.1 02/16/2022   ALBUMIN 3.8 10/03/2021    No results found for: "MG" Lab Results  Component Value Date   VD25OH 33.5 03/22/2020   VD25OH 28.6 (L) 12/21/2019    No results found for: "PREALBUMIN"    Latest Ref Rng & Units 03/20/2022    2:59  PM 02/16/2022    2:17 PM 01/31/2022    3:36 PM  CBC EXTENDED  WBC 4.0 - 10.5 K/uL 8.5  7.8  9.1   RBC 3.87 - 5.11 Mil/uL 4.04  3.76  3.83   Hemoglobin 12.0 - 15.0 g/dL 12.0  11.5  11.4   HCT 36.0 - 46.0 % 37.1  34.6  36.6   Platelets 150.0 - 400.0 K/uL 352.0  322.0  323   NEUT# 1.4 - 7.7 K/uL 6.5  6.0    Lymph# 0.7 - 4.0 K/uL 1.2  1.1       There is no height or weight on file to calculate BMI.  Orders:  No orders of the defined types were placed in this encounter.  No orders of the defined types were placed in this encounter.    Procedures: No procedures performed  Clinical Data: No additional findings.  ROS:  All other systems negative, except as noted in the HPI. Review of Systems  Objective: Vital Signs: There were no vitals taken for this visit.  Specialty Comments:  No specialty comments available.  PMFS History: Patient Active Problem List   Diagnosis Date Noted   Arthritis 04/16/2022   Asthma 04/16/2022   Hematoma of left lower leg  Wheezing 11/23/2021   Snoring 11/23/2021   Other secondary pulmonary hypertension ([place]) 11/23/2021   Hyponatremia 10/11/2021   Dyspnea on exertion    Dyslipidemia 10/04/2021   Mediastinal mass 10/04/2021   Obstructive sleep apnea 08/25/2021   RUQ pain 08/19/2021   Hepatic steatosis 08/19/2021   Hepatomegaly 08/19/2021   Aortic atherosclerosis ([place]) 08/19/2021   Diverticulosis 08/19/2021   Anxiety 08/02/2021   Bipolar 1 disorder ([place]) 08/02/2021   CHF (congestive heart failure) ([place]) 08/02/2021   Depression 08/02/2021   Joint pain 08/02/2021   Screening for malignant neoplasm of colon 62/37/6283   Diastolic dysfunction 15/17/6160   Prediabetes 12/22/2019   Vitamin D insufficiency 12/22/2019   Class 3 severe obesity with serious comorbidity and body mass index (BMI) greater than or equal to 70 in adult [facility]) 12/21/2019   Essential hypertension 04/30/2019   Mild intermittent asthma without complication 73/71/0626    Past Medical History:  Diagnosis Date   Anxiety    Aortic atherosclerosis ([place]) 08/19/2021   Arthritis    Asthma    Bipolar 1 disorder (HCC)    Chronic diastolic CHF (congestive heart failure) (HCC)    Class 3 severe obesity with serious comorbidity and body mass index (BMI) greater than or equal to 70 in adult ([place]) 12/21/2019   Depression    Diastolic dysfunction 94/85/4627   Diverticulosis 08/19/2021   Edema of both lower extremities    Hepatic steatosis 08/19/2021   Hepatomegaly 08/19/2021   High blood pressure    Hyperlipidemia    Joint pain    Morbid obesity ([place])    Pre-diabetes     Family History  Problem Relation Age of Onset   Heart Problems Mother        Broken heart syndrome   Hypertension Mother    Hypercholesterolemia Mother    Bone cancer Father    Testicular cancer Father    Heart attack Sister    Diabetes Brother    Colon cancer Maternal Grandfather    Dementia Paternal Grandmother     Past Surgical History:  Procedure Laterality Date   APPLICATION OF WOUND VAC Left 12/20/2021   Procedure: APPLICATION OF WOUND VAC;  Surgeon: [name], MD;  Location: [place];  Service: Orthopedics;  Laterality: Left;   I & D EXTREMITY Left 12/20/2021   Procedure: EXCISIONAL DEBRIDEMENT HEMATOMA LEFT LEG;  Surgeon: [name], MD;  Location: [place];  Service: Orthopedics;  Laterality: Left;   NO PAST SURGERIES     RIGHT/LEFT HEART CATH AND CORONARY ANGIOGRAPHY N/A 10/06/2021   Procedure: RIGHT/LEFT HEART CATH AND CORONARY ANGIOGRAPHY;  Surgeon: Jettie Booze, MD;  Location: [place] [facility];  Service: Cardiovascular;  Laterality: N/A;   Social History   Occupational History   Occupation: Medical illustrator, work from home  Tobacco Use   Smoking status: Never    Passive exposure: Never   Smokeless tobacco: Never  Vaping Use   Vaping Use: Never used  Substance and Sexual Activity   Alcohol use: Not Currently    Comment: Maybe 2 glasses of wine a month    Drug use: Never   Sexual activity: Not on file

## 2022-06-20 ENCOUNTER — Encounter: Payer: Self-pay | Admitting: Family

## 2022-06-20 ENCOUNTER — Ambulatory Visit (INDEPENDENT_AMBULATORY_CARE_PROVIDER_SITE_OTHER): Payer: Commercial Managed Care - HMO | Admitting: Family

## 2022-06-20 DIAGNOSIS — S8012XA Contusion of left lower leg, initial encounter: Secondary | ICD-10-CM | POA: Diagnosis not present

## 2022-06-20 NOTE — Progress Notes (Signed)
Office Visit Note   Patient: Christina Dominguez           Date of Birth: 01-05-1966           MRN: [id number] Visit Date: 06/20/2022              Requested by: Allwardt, Randa Evens, PA-C [place],  [place] [address] PCP: Fredirick Lathe, PA-C  Chief Complaint  Patient presents with   Left Leg - Wound Check      HPI: Patient is a 56 year old woman who comes in today in routine follow-up status post irrigation debridement of a left lower extremity ulcer with artificial graft placement.  This was wrapped serially with compression and is now resolved.  Today she is in compression hose which unfortunately easily have rolled down  Assessment & Plan: Visit Diagnoses: No diagnosis found.  Plan: Happy with the resolution of the ulcer.  Discussed the importance of continuing compression and she understands.  She will continue with close monitoring and compression to the lower extremities.  Follow-Up Instructions: Return in about 4 weeks (around 07/18/2022), or if symptoms worsen or fail to improve.   Ortho Exam  Patient is alert, oriented, no adenopathy, well-dressed, normal affect, normal respiratory effort. On examination of the left lower extremity there is some control of the edema from the compression garments from the level of the stockings distally the wound is healed she has 1 drop of serous drainage at the area of her scar there is no erythema warmth or sign of infection  Imaging: No results found. No images are attached to the encounter.  Labs: Lab Results  Component Value Date   HGBA1C 5.9 (H) 08/20/2021   HGBA1C 6.0 (H) 03/22/2020   HGBA1C 6.0 (H) 12/21/2019   CRP 2.4 02/16/2022   CRP 1.6 (H) 10/09/2021   REPTSTATUS 12/25/2021 FINAL 12/20/2021   GRAMSTAIN NO WBC SEEN NO ORGANISMS SEEN  12/20/2021   CULT  12/20/2021    No growth aerobically or anaerobically. Performed at [facility], [place] [address] [place], [place] [address]      Lab  Results  Component Value Date   ALBUMIN 4.3 03/20/2022   ALBUMIN 4.1 02/16/2022   ALBUMIN 3.8 10/03/2021    No results found for: "MG" Lab Results  Component Value Date   VD25OH 33.5 03/22/2020   VD25OH 28.6 (L) 12/21/2019    No results found for: "PREALBUMIN"    Latest Ref Rng & Units 03/20/2022    2:59 PM 02/16/2022    2:17 PM 01/31/2022    3:36 PM  CBC EXTENDED  WBC 4.0 - 10.5 K/uL 8.5  7.8  9.1   RBC 3.87 - 5.11 Mil/uL 4.04  3.76  3.83   Hemoglobin 12.0 - 15.0 g/dL 12.0  11.5  11.4   HCT 36.0 - 46.0 % 37.1  34.6  36.6   Platelets 150.0 - 400.0 K/uL 352.0  322.0  323   NEUT# 1.4 - 7.7 K/uL 6.5  6.0    Lymph# 0.7 - 4.0 K/uL 1.2  1.1       There is no height or weight on file to calculate BMI.  Orders:  No orders of the defined types were placed in this encounter.  No orders of the defined types were placed in this encounter.    Procedures: No procedures performed  Clinical Data: No additional findings.  ROS:  All other systems negative, except as noted in the HPI. Review of  Systems  Objective: Vital Signs: There were no vitals taken for this visit.  Specialty Comments:  No specialty comments available.  PMFS History: Patient Active Problem List   Diagnosis Date Noted   Arthritis 04/16/2022   Asthma 04/16/2022   Hematoma of left lower leg    Wheezing 11/23/2021   Snoring 11/23/2021   Other secondary pulmonary hypertension ([place]) 11/23/2021   Hyponatremia 10/11/2021   Dyspnea on exertion    Dyslipidemia 10/04/2021   Mediastinal mass 10/04/2021   Obstructive sleep apnea 08/25/2021   RUQ pain 08/19/2021   Hepatic steatosis 08/19/2021   Hepatomegaly 08/19/2021   Aortic atherosclerosis ([place]) 08/19/2021   Diverticulosis 08/19/2021   Anxiety 08/02/2021   Bipolar 1 disorder ([place]) 08/02/2021   CHF (congestive heart failure) ([place]) 08/02/2021   Depression 08/02/2021   Joint pain 08/02/2021   Screening for malignant neoplasm of colon 99/83/3825    Diastolic dysfunction 05/39/7673   Prediabetes 12/22/2019   Vitamin D insufficiency 12/22/2019   Class 3 severe obesity with serious comorbidity and body mass index (BMI) greater than or equal to 70 in adult ([place]) 12/21/2019   Essential hypertension 04/30/2019   Mild intermittent asthma without complication 41/93/7902   Past Medical History:  Diagnosis Date   Anxiety    Aortic atherosclerosis ([place]) 08/19/2021   Arthritis    Asthma    Bipolar 1 disorder (HCC)    Chronic diastolic CHF (congestive heart failure) (HCC)    Class 3 severe obesity with serious comorbidity and body mass index (BMI) greater than or equal to 70 in adult ([place]) 12/21/2019   Depression    Diastolic dysfunction 40/97/3532   Diverticulosis 08/19/2021   Edema of both lower extremities    Hepatic steatosis 08/19/2021   Hepatomegaly 08/19/2021   High blood pressure    Hyperlipidemia    Joint pain    Morbid obesity ([place])    Pre-diabetes     Family History  Problem Relation Age of Onset   Heart Problems Mother        Broken heart syndrome   Hypertension Mother    Hypercholesterolemia Mother    Bone cancer Father    Testicular cancer Father    Heart attack Sister    Diabetes Brother    Colon cancer Maternal Grandfather    Dementia Paternal Grandmother     Past Surgical History:  Procedure Laterality Date   APPLICATION OF WOUND VAC Left 12/20/2021   Procedure: APPLICATION OF WOUND VAC;  Surgeon: [name], MD;  Location: [place];  Service: Orthopedics;  Laterality: Left;   I & D EXTREMITY Left 12/20/2021   Procedure: EXCISIONAL DEBRIDEMENT HEMATOMA LEFT LEG;  Surgeon: [name], MD;  [facility];  Service: Orthopedics;  Laterality: Left;   NO PAST SURGERIES     RIGHT/LEFT HEART CATH AND CORONARY ANGIOGRAPHY N/A 10/06/2021   Procedure: RIGHT/LEFT HEART CATH AND CORONARY ANGIOGRAPHY;  Surgeon: Jettie Booze, MD;  Location: [place] [facility];  Service: Cardiovascular;  Laterality: N/A;    Social History   Occupational History   Occupation: Medical illustrator, work from home  Tobacco Use   Smoking status: Never    Passive exposure: Never   Smokeless tobacco: Never  Vaping Use   Vaping Use: Never used  Substance and Sexual Activity   Alcohol use: Not Currently    Comment: Maybe 2 glasses of wine a month   Drug use: Never   Sexual activity: Not on file

## 2022-06-22 ENCOUNTER — Ambulatory Visit (HOSPITAL_BASED_OUTPATIENT_CLINIC_OR_DEPARTMENT_OTHER): Payer: Commercial Managed Care - HMO | Admitting: Pulmonary Disease

## 2022-07-03 ENCOUNTER — Encounter (HOSPITAL_BASED_OUTPATIENT_CLINIC_OR_DEPARTMENT_OTHER): Payer: Self-pay | Admitting: Pulmonary Disease

## 2022-07-03 ENCOUNTER — Other Ambulatory Visit (HOSPITAL_BASED_OUTPATIENT_CLINIC_OR_DEPARTMENT_OTHER): Payer: Self-pay

## 2022-07-03 ENCOUNTER — Ambulatory Visit (INDEPENDENT_AMBULATORY_CARE_PROVIDER_SITE_OTHER): Payer: Commercial Managed Care - HMO | Admitting: Pulmonary Disease

## 2022-07-03 VITALS — BP 140/84 | HR 78 | Ht 60.0 in | Wt 380.3 lb

## 2022-07-03 DIAGNOSIS — G4734 Idiopathic sleep related nonobstructive alveolar hypoventilation: Secondary | ICD-10-CM

## 2022-07-03 DIAGNOSIS — G4730 Sleep apnea, unspecified: Secondary | ICD-10-CM

## 2022-07-03 MED ORDER — COMIRNATY 30 MCG/0.3ML IM SUSY
PREFILLED_SYRINGE | INTRAMUSCULAR | 0 refills | Status: DC
  Filled 2022-07-03 – 2022-10-11 (×2): qty 0.3, 1d supply, fill #0

## 2022-07-03 NOTE — Progress Notes (Signed)
Subjective:   PATIENT ID: Christina Dominguez GENDER: female DOB: 09-01-1965, MRN: [id number]   HPI  Chief Complaint  Patient presents with   Follow-up    Feeling a lot better    Reason for Visit: F/u  Christina Dominguez is a 56 year old female never smoker with morbid obesity, clinical asthma, hx of chronic bronchitis (no recent issues in 6 years), HTN, HLD, moderate pulmonary hypertension, chronic diastolic heart failure, OSA who presents for follow-up for concern for asthma and OSA evaluation.  Initial consult She was incidentally diagnosed with COVID in March 2023 during her hospitalization for heart failure. Since then she reports her shortness of breath and wheezing has remained. She has been more active with water aerobics and changed her diet. Wheezing at night has resolved.   While in the hospital she reports episodes of witnessed apnea by staff and was started on CPAP. Tolerated it well and reported improved sleep quality. At home she would wake up every two hours due to nocturia. Now has fatigue off the machine.  01/30/22 She reports developing left lower extremity edema in the last 24 hours. She reports debridement of left leg hematoma on 12/20/21 on the same limb. She was walking however last week had GI illness and was not as ambulatory. Since starting her oxygen, her quality of sleep is improved however she had better control on CPAP while in the hospital. This helps with her activity level and energy. She has some wheezing a few times a week. Occasional cough. Shortness of breath with moderate exertion. Does not limit activity.  07/03/22 Since our last visit she reports her breathing has improved. Has rarely needed her albuterol. Denies wheezing, cough. Shortness of breath with exertion but attributes to her inactivity after surgery. Wears oxygen nightly and feels this helps. She continues to have nocturia so unable to stay asleep. Wakes up every few hours. Has not been  contacted about CPAP supplies.  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 >10 years ago.   Past Medical History:  Diagnosis Date   Anxiety    Aortic atherosclerosis ([place]) 08/19/2021   Arthritis    Asthma    Bipolar 1 disorder (HCC)    Chronic diastolic CHF (congestive heart failure) (HCC)    Class 3 severe obesity with serious comorbidity and body mass index (BMI) greater than or equal to 70 in adult [facility]) 12/21/2019   Depression    Diastolic dysfunction 56/81/2751   Diverticulosis 08/19/2021   Edema of both lower extremities    Hepatic steatosis 08/19/2021   Hepatomegaly 08/19/2021   High blood pressure    Hyperlipidemia    Joint pain    Morbid obesity ([place])    Pre-diabetes      Family History  Problem Relation Age of Onset   Heart Problems Mother        Broken heart syndrome   Hypertension Mother    Hypercholesterolemia Mother    Bone cancer Father    Testicular cancer Father    Heart attack Sister    Diabetes Brother    Colon cancer Maternal Grandfather    Dementia Paternal Grandmother      Social History   Occupational History   Occupation: Medical illustrator, work from home  Tobacco Use   Smoking status: Never    Passive exposure: Never   Smokeless tobacco: Never  Vaping Use   Vaping Use: Never used  Substance and  Sexual Activity   Alcohol use: Not Currently    Comment: Maybe 2 glasses of wine a month   Drug use: Never   Sexual activity: Not on file    Allergies  Allergen Reactions   Aspirin Nausea And Vomiting   Eggs Or Egg-Derived Products Swelling   Influenza Vaccines Swelling     Outpatient Medications Prior to Visit  Medication Sig Dispense Refill   acetaminophen (TYLENOL) 650 MG CR tablet Take 1,300 mg by mouth every 8 (eight) hours as needed for pain.     albuterol (VENTOLIN HFA) 108 (90 Base) MCG/ACT inhaler Inhale 2 puffs into the lungs every 6 (six) hours as needed for wheezing  or shortness of breath. 8 g 6   CATS CLAW, UNCARIA TOMENTOSA, PO Take 2 capsules by mouth daily.     diclofenac Sodium (VOLTAREN) 1 % GEL Apply 2 g topically daily as needed (pain).     diphenhydrAMINE (BENADRYL) 25 MG tablet Take 50 mg by mouth at bedtime as needed for sleep.     furosemide (LASIX) 80 MG tablet Take 1 tablet (80 mg total) by mouth daily. (Patient taking differently: Take 80 mg by mouth daily. Patient taking '120mg'$ ) 90 tablet 1   OVER THE COUNTER MEDICATION Take 3 tablets by mouth daily. Mobility essentials supplement     potassium chloride SA (KLOR-CON M) 20 MEQ tablet Take 1 tablet (20 mEq total) by mouth daily. 30 tablet 5   TURMERIC PO Take 1 tablet by mouth daily.     valsartan (DIOVAN) 40 MG tablet Take 1 tablet (40 mg total) by mouth daily. 90 tablet 3   Cholecalciferol (VITAMIN D) 50 MCG (2000 UT) tablet Take 2,000 Units by mouth daily. (Patient not taking: Reported on 07/03/2022)     dicyclomine (BENTYL) 10 MG capsule Take 1 capsule (10 mg total) by mouth 4 (four) times daily -  before meals and at bedtime. 120 capsule 2   No facility-administered medications prior to visit.    Review of Systems  Constitutional:  Positive for malaise/fatigue. Negative for chills, diaphoresis, fever and weight loss.  HENT:  Negative for congestion.   Respiratory:  Positive for shortness of breath. Negative for cough, hemoptysis, sputum production and wheezing.   Cardiovascular:  Negative for chest pain, palpitations and leg swelling.     Objective:   Vitals:   07/03/22 1514  BP: (!) 140/84  Pulse: 78  SpO2: 95%  Weight: (!) 380 lb 4.7 oz (172.5 kg)  Height: 5' (1.524 m)   SpO2: 95 % O2 Device: None (Room air)  Physical Exam: General: Obese, well-appearing, no acute distress HENT: [place], AT Eyes: EOMI, no scleral icterus Respiratory: Clear to auscultation bilaterally.  No crackles, wheezing or rales Cardiovascular: RRR, -M/R/G, no  JVD Extremities:-Edema,-tenderness Neuro: AAO x4, CNII-XII grossly intact Psych: Normal mood, normal affect  Data Reviewed:  Imaging: CTA 10/03/21 - No PE. Dilation of main pulmonary artery suggestive of PH. 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 Pulmonary artery enlargement. CXR 10/09/2021-bibasilar atelectasis  PFT: 01/30/22 FVC 1.24 (42%) FEV1 1.02 (44%) Ratio 75  DLCO 70% Interpretation: Severe obstructive defect based on FEV1/SVC. Partial bronchodilator response however not significant. This does not preclude the benefit of bronchodilators. Mildly reduced DLCO.   Labs: CBC    Component  Value Date/Time   WBC 8.5 03/20/2022 1459   RBC 4.04 03/20/2022 1459   HGB 12.0 03/20/2022 1459   HCT 37.1 03/20/2022 1459   PLT 352.0 03/20/2022 1459   MCV 91.9 03/20/2022 1459   MCH 29.8 01/31/2022 1536   MCHC 32.5 03/20/2022 1459   RDW 15.9 (H) 03/20/2022 1459   LYMPHSABS 1.2 03/20/2022 1459   MONOABS 0.5 03/20/2022 1459   EOSABS 0.2 03/20/2022 1459   BASOSABS 0.1 03/20/2022 1459   Absolute eos 10/03/21 - 200  Sleep: Split night 12/28/21 - Mild OSA and PLMS with AHI 7.7 Nadir SpO2 75%. Recommend therapy including CPAP, oral appliance, or surgical assessment.    Assessment & Plan:   Discussion: 56 year old female never smoker with morbid obesity, asthma, hx of chronic bronchitis (no recent issues in 6 years), HTN, HLD, moderate PH, chronic diastolic heart failure who presents for follow-up for asthma and OSA. COVID symptoms have improved.   Intermittent asthma - asymptomatic Post-COVID - resolved --CONTINUE albuterol as needed  Mild OSA with nocturnal hypoxemia and apnea PLMS Pulmonary hypertension --RECHECK on DME for  auto CPAP 5-20 cm H20 and supplies.  >>Not ordered on last visit. Will order now --CONTINUE 2L O2 nightly --Discussed regular exercise including walking 10,000 steps a day  Health Maintenance Immunization History  Administered Date(s) Administered   Janssen (J&J) SARS-COV-2 Vaccination 11/16/2019   Tdap 04/01/2019   Zoster Recombinat (Shingrix) 10/26/2021    Orders Placed This Encounter  Procedures   Ambulatory Referral for DME    Referral Priority:   Routine    Referral Type:   Durable Medical Equipment Purchase    Number of Visits Requested:   1   No orders of the defined types were placed in this encounter.   Return in about 6 months (around 01/01/2023).   I have spent a total time of 30-minutes on the day of the appointment including chart review, data review, collecting history, coordinating care and discussing medical diagnosis and plan with the patient/family. Past medical history, allergies, medications were reviewed. Pertinent imaging, labs and tests included in this note have been reviewed and interpreted independently by me.  [place], MD [facility] 07/03/2022 5:02 PM  Office Number [phone]

## 2022-07-03 NOTE — Patient Instructions (Signed)
Intermittent asthma - asymptomatic Post-COVID - resolved --CONTINUE albuterol as needed  Mild OSA with nocturnal hypoxemia and apnea PLMS Pulmonary hypertension --RECHECK on DME for  auto CPAP 5-20 cm H20 and supplies --CONTINUE 2L O2 nightly --Discussed regular exercise including walking 10,000 steps a day  Follow-up with me in 6 months

## 2022-07-18 ENCOUNTER — Ambulatory Visit: Payer: Commercial Managed Care - HMO | Admitting: Family

## 2022-07-19 ENCOUNTER — Encounter: Payer: Self-pay | Admitting: *Deleted

## 2022-08-08 ENCOUNTER — Other Ambulatory Visit: Payer: Self-pay

## 2022-08-09 ENCOUNTER — Other Ambulatory Visit: Payer: Self-pay

## 2022-08-10 ENCOUNTER — Encounter: Payer: Self-pay | Admitting: Physician Assistant

## 2022-08-15 ENCOUNTER — Other Ambulatory Visit: Payer: Self-pay

## 2022-08-15 MED ORDER — VALSARTAN 40 MG PO TABS: 40.0000 mg | ORAL_TABLET | Freq: Every day | ORAL | 0 refills | Status: DC

## 2022-08-15 NOTE — Telephone Encounter (Signed)
Valsartan 40 mg # 90 only, patient needs appointment for future refill / 1st attempt

## 2022-08-27 ENCOUNTER — Other Ambulatory Visit: Payer: Self-pay | Admitting: Cardiology

## 2022-09-04 DIAGNOSIS — G4733 Obstructive sleep apnea (adult) (pediatric): Secondary | ICD-10-CM | POA: Diagnosis not present

## 2022-09-04 DIAGNOSIS — R531 Weakness: Secondary | ICD-10-CM | POA: Diagnosis not present

## 2022-09-04 DIAGNOSIS — I509 Heart failure, unspecified: Secondary | ICD-10-CM | POA: Diagnosis not present

## 2022-10-04 DIAGNOSIS — R531 Weakness: Secondary | ICD-10-CM | POA: Diagnosis not present

## 2022-10-04 DIAGNOSIS — G4733 Obstructive sleep apnea (adult) (pediatric): Secondary | ICD-10-CM | POA: Diagnosis not present

## 2022-10-04 DIAGNOSIS — I509 Heart failure, unspecified: Secondary | ICD-10-CM | POA: Diagnosis not present

## 2022-10-05 DIAGNOSIS — G4733 Obstructive sleep apnea (adult) (pediatric): Secondary | ICD-10-CM | POA: Diagnosis not present

## 2022-10-05 DIAGNOSIS — I509 Heart failure, unspecified: Secondary | ICD-10-CM | POA: Diagnosis not present

## 2022-10-05 DIAGNOSIS — R531 Weakness: Secondary | ICD-10-CM | POA: Diagnosis not present

## 2022-10-11 ENCOUNTER — Other Ambulatory Visit: Payer: Self-pay | Admitting: Cardiology

## 2022-10-11 ENCOUNTER — Other Ambulatory Visit: Payer: Self-pay | Admitting: Physician Assistant

## 2022-10-11 ENCOUNTER — Other Ambulatory Visit (HOSPITAL_BASED_OUTPATIENT_CLINIC_OR_DEPARTMENT_OTHER): Payer: Self-pay

## 2022-10-11 MED ORDER — VALSARTAN 40 MG PO TABS: 40.0000 mg | ORAL_TABLET | Freq: Every day | ORAL | 1 refills | Status: DC

## 2022-10-11 MED ORDER — DICYCLOMINE HCL 10 MG PO CAPS: 10.0000 mg | ORAL_CAPSULE | Freq: Three times a day (TID) | ORAL | 2 refills | Status: DC

## 2022-11-05 DIAGNOSIS — R531 Weakness: Secondary | ICD-10-CM | POA: Diagnosis not present

## 2022-11-05 DIAGNOSIS — G4733 Obstructive sleep apnea (adult) (pediatric): Secondary | ICD-10-CM | POA: Diagnosis not present

## 2022-11-05 DIAGNOSIS — I509 Heart failure, unspecified: Secondary | ICD-10-CM | POA: Diagnosis not present

## 2022-12-05 DIAGNOSIS — G4733 Obstructive sleep apnea (adult) (pediatric): Secondary | ICD-10-CM | POA: Diagnosis not present

## 2022-12-05 DIAGNOSIS — R531 Weakness: Secondary | ICD-10-CM | POA: Diagnosis not present

## 2022-12-05 DIAGNOSIS — I509 Heart failure, unspecified: Secondary | ICD-10-CM | POA: Diagnosis not present

## 2022-12-16 DIAGNOSIS — U071 COVID-19: Secondary | ICD-10-CM | POA: Diagnosis not present

## 2023-01-05 DIAGNOSIS — G4733 Obstructive sleep apnea (adult) (pediatric): Secondary | ICD-10-CM | POA: Diagnosis not present

## 2023-01-05 DIAGNOSIS — I509 Heart failure, unspecified: Secondary | ICD-10-CM | POA: Diagnosis not present

## 2023-01-05 DIAGNOSIS — R531 Weakness: Secondary | ICD-10-CM | POA: Diagnosis not present

## 2023-02-04 DIAGNOSIS — I509 Heart failure, unspecified: Secondary | ICD-10-CM | POA: Diagnosis not present

## 2023-02-04 DIAGNOSIS — R531 Weakness: Secondary | ICD-10-CM | POA: Diagnosis not present

## 2023-02-04 DIAGNOSIS — G4733 Obstructive sleep apnea (adult) (pediatric): Secondary | ICD-10-CM | POA: Diagnosis not present

## 2023-03-07 DIAGNOSIS — R531 Weakness: Secondary | ICD-10-CM | POA: Diagnosis not present

## 2023-03-07 DIAGNOSIS — I509 Heart failure, unspecified: Secondary | ICD-10-CM | POA: Diagnosis not present

## 2023-03-07 DIAGNOSIS — G4733 Obstructive sleep apnea (adult) (pediatric): Secondary | ICD-10-CM | POA: Diagnosis not present

## 2023-04-07 DIAGNOSIS — G4733 Obstructive sleep apnea (adult) (pediatric): Secondary | ICD-10-CM | POA: Diagnosis not present

## 2023-04-07 DIAGNOSIS — I509 Heart failure, unspecified: Secondary | ICD-10-CM | POA: Diagnosis not present

## 2023-04-07 DIAGNOSIS — R531 Weakness: Secondary | ICD-10-CM | POA: Diagnosis not present

## 2023-04-10 ENCOUNTER — Other Ambulatory Visit: Payer: Self-pay | Admitting: Cardiology

## 2023-05-07 DIAGNOSIS — G4733 Obstructive sleep apnea (adult) (pediatric): Secondary | ICD-10-CM | POA: Diagnosis not present

## 2023-05-07 DIAGNOSIS — I509 Heart failure, unspecified: Secondary | ICD-10-CM | POA: Diagnosis not present

## 2023-05-07 DIAGNOSIS — R531 Weakness: Secondary | ICD-10-CM | POA: Diagnosis not present

## 2023-06-04 ENCOUNTER — Emergency Department (HOSPITAL_BASED_OUTPATIENT_CLINIC_OR_DEPARTMENT_OTHER): Payer: Medicaid Other

## 2023-06-04 ENCOUNTER — Encounter (HOSPITAL_BASED_OUTPATIENT_CLINIC_OR_DEPARTMENT_OTHER): Payer: Self-pay | Admitting: Emergency Medicine

## 2023-06-04 ENCOUNTER — Other Ambulatory Visit: Payer: Self-pay

## 2023-06-04 ENCOUNTER — Inpatient Hospital Stay (HOSPITAL_BASED_OUTPATIENT_CLINIC_OR_DEPARTMENT_OTHER)
Admission: EM | Admit: 2023-06-04 | Discharge: 2023-06-08 | DRG: 602 | Disposition: A | Discharge status: Home / Self Care | Payer: Medicaid Other | Attending: Family Medicine | Admitting: Family Medicine

## 2023-06-04 DIAGNOSIS — R0902 Hypoxemia: Secondary | ICD-10-CM | POA: Diagnosis not present

## 2023-06-04 DIAGNOSIS — I5033 Acute on chronic diastolic (congestive) heart failure: Secondary | ICD-10-CM | POA: Diagnosis present

## 2023-06-04 DIAGNOSIS — I110 Hypertensive heart disease with heart failure: Secondary | ICD-10-CM | POA: Diagnosis present

## 2023-06-04 DIAGNOSIS — Z91012 Allergy to eggs: Secondary | ICD-10-CM

## 2023-06-04 DIAGNOSIS — E66813 Obesity, class 3: Secondary | ICD-10-CM | POA: Diagnosis not present

## 2023-06-04 DIAGNOSIS — F319 Bipolar disorder, unspecified: Secondary | ICD-10-CM | POA: Diagnosis not present

## 2023-06-04 DIAGNOSIS — X58XXXA Exposure to other specified factors, initial encounter: Secondary | ICD-10-CM | POA: Diagnosis present

## 2023-06-04 DIAGNOSIS — R7401 Elevation of levels of liver transaminase levels: Secondary | ICD-10-CM | POA: Insufficient documentation

## 2023-06-04 DIAGNOSIS — E785 Hyperlipidemia, unspecified: Secondary | ICD-10-CM | POA: Diagnosis not present

## 2023-06-04 DIAGNOSIS — S8012XA Contusion of left lower leg, initial encounter: Secondary | ICD-10-CM | POA: Diagnosis not present

## 2023-06-04 DIAGNOSIS — Z1152 Encounter for screening for COVID-19: Secondary | ICD-10-CM

## 2023-06-04 DIAGNOSIS — Z886 Allergy status to analgesic agent status: Secondary | ICD-10-CM

## 2023-06-04 DIAGNOSIS — R072 Precordial pain: Secondary | ICD-10-CM | POA: Diagnosis present

## 2023-06-04 DIAGNOSIS — F419 Anxiety disorder, unspecified: Secondary | ICD-10-CM | POA: Diagnosis present

## 2023-06-04 DIAGNOSIS — I890 Lymphedema, not elsewhere classified: Secondary | ICD-10-CM | POA: Diagnosis not present

## 2023-06-04 DIAGNOSIS — L03119 Cellulitis of unspecified part of limb: Secondary | ICD-10-CM | POA: Diagnosis present

## 2023-06-04 DIAGNOSIS — I700 Atherosclerosis of aorta: Secondary | ICD-10-CM | POA: Diagnosis present

## 2023-06-04 DIAGNOSIS — Z887 Allergy status to serum and vaccine status: Secondary | ICD-10-CM

## 2023-06-04 DIAGNOSIS — I2729 Other secondary pulmonary hypertension: Secondary | ICD-10-CM | POA: Diagnosis not present

## 2023-06-04 DIAGNOSIS — J9601 Acute respiratory failure with hypoxia: Secondary | ICD-10-CM | POA: Diagnosis not present

## 2023-06-04 DIAGNOSIS — M1909 Primary osteoarthritis, other specified site: Secondary | ICD-10-CM | POA: Diagnosis not present

## 2023-06-04 DIAGNOSIS — L03116 Cellulitis of left lower limb: Principal | ICD-10-CM | POA: Diagnosis present

## 2023-06-04 DIAGNOSIS — Z6841 Body Mass Index (BMI) 40.0 and over, adult: Secondary | ICD-10-CM

## 2023-06-04 DIAGNOSIS — I509 Heart failure, unspecified: Secondary | ICD-10-CM

## 2023-06-04 DIAGNOSIS — E871 Hypo-osmolality and hyponatremia: Secondary | ICD-10-CM | POA: Diagnosis present

## 2023-06-04 DIAGNOSIS — I5032 Chronic diastolic (congestive) heart failure: Secondary | ICD-10-CM | POA: Insufficient documentation

## 2023-06-04 DIAGNOSIS — R0602 Shortness of breath: Secondary | ICD-10-CM | POA: Insufficient documentation

## 2023-06-04 DIAGNOSIS — Z8249 Family history of ischemic heart disease and other diseases of the circulatory system: Secondary | ICD-10-CM

## 2023-06-04 DIAGNOSIS — G4733 Obstructive sleep apnea (adult) (pediatric): Secondary | ICD-10-CM | POA: Diagnosis present

## 2023-06-04 DIAGNOSIS — L0390 Cellulitis, unspecified: Secondary | ICD-10-CM | POA: Diagnosis present

## 2023-06-04 DIAGNOSIS — R079 Chest pain, unspecified: Secondary | ICD-10-CM

## 2023-06-04 DIAGNOSIS — J4489 Other specified chronic obstructive pulmonary disease: Secondary | ICD-10-CM | POA: Diagnosis not present

## 2023-06-04 DIAGNOSIS — I10 Essential (primary) hypertension: Secondary | ICD-10-CM | POA: Diagnosis present

## 2023-06-04 DIAGNOSIS — Z79899 Other long term (current) drug therapy: Secondary | ICD-10-CM

## 2023-06-04 LAB — CBC WITH DIFFERENTIAL/PLATELET
Abs Immature Granulocytes: 0.06 10*3/uL (ref 0.00–0.07)
Basophils Absolute: 0 10*3/uL (ref 0.0–0.1)
Basophils Relative: 0 %
Eosinophils Absolute: 0.1 10*3/uL (ref 0.0–0.5)
Eosinophils Relative: 1 %
HCT: 39.2 % (ref 36.0–46.0)
Hemoglobin: 12.7 g/dL (ref 12.0–15.0)
Immature Granulocytes: 0 %
Lymphocytes Relative: 8 %
Lymphs Abs: 1.3 10*3/uL (ref 0.7–4.0)
MCH: 29.3 pg (ref 26.0–34.0)
MCHC: 32.4 g/dL (ref 30.0–36.0)
MCV: 90.3 fL (ref 80.0–100.0)
Monocytes Absolute: 0.6 10*3/uL (ref 0.1–1.0)
Monocytes Relative: 3 %
Neutro Abs: 14.6 10*3/uL — ABNORMAL HIGH (ref 1.7–7.7)
Neutrophils Relative %: 88 %
Platelets: 311 10*3/uL (ref 150–400)
RBC: 4.34 MIL/uL (ref 3.87–5.11)
RDW: 15 % (ref 11.5–15.5)
WBC: 16.6 10*3/uL — ABNORMAL HIGH (ref 4.0–10.5)
nRBC: 0 % (ref 0.0–0.2)

## 2023-06-04 LAB — BRAIN NATRIURETIC PEPTIDE: B Natriuretic Peptide: 75.7 pg/mL (ref 0.0–100.0)

## 2023-06-04 LAB — I-STAT VENOUS BLOOD GAS, ED
Acid-Base Excess: 7 mmol/L — ABNORMAL HIGH (ref 0.0–2.0)
Bicarbonate: 33.8 mmol/L — ABNORMAL HIGH (ref 20.0–28.0)
Calcium, Ion: 1.18 mmol/L (ref 1.15–1.40)
HCT: 41 % (ref 36.0–46.0)
Hemoglobin: 13.9 g/dL (ref 12.0–15.0)
O2 Saturation: 54 %
Potassium: 3.7 mmol/L (ref 3.5–5.1)
Sodium: 136 mmol/L (ref 135–145)
TCO2: 36 mmol/L — ABNORMAL HIGH (ref 22–32)
pCO2, Ven: 58.5 mm[Hg] (ref 44–60)
pH, Ven: 7.37 (ref 7.25–7.43)
pO2, Ven: 30 mm[Hg] — CL (ref 32–45)

## 2023-06-04 LAB — COMPREHENSIVE METABOLIC PANEL
ALT: 29 U/L (ref 0–44)
AST: 34 U/L (ref 15–41)
Albumin: 3.5 g/dL (ref 3.5–5.0)
Alkaline Phosphatase: 115 U/L (ref 38–126)
Anion gap: 12 (ref 5–15)
BUN: 21 mg/dL — ABNORMAL HIGH (ref 6–20)
CO2: 29 mmol/L (ref 22–32)
Calcium: 8.9 mg/dL (ref 8.9–10.3)
Chloride: 93 mmol/L — ABNORMAL LOW (ref 98–111)
Creatinine, Ser: 0.85 mg/dL (ref 0.44–1.00)
GFR, Estimated: >60 mL/min (ref >60)
Glucose, Bld: 105 mg/dL — ABNORMAL HIGH (ref 70–99)
Potassium: 3.7 mmol/L (ref 3.5–5.1)
Sodium: 134 mmol/L — ABNORMAL LOW (ref 135–145)
Total Bilirubin: 0.8 mg/dL (ref 0.3–1.2)
Total Protein: 7.6 g/dL (ref 6.5–8.1)

## 2023-06-04 LAB — LACTIC ACID, PLASMA
Lactic Acid, Venous: 1.1 mmol/L (ref 0.5–1.9)
Lactic Acid, Venous: 0.7 mmol/L (ref 0.5–1.9)

## 2023-06-04 LAB — RESP PANEL BY RT-PCR (RSV, FLU A&B, COVID)  RVPGX2
Influenza A by PCR: NEGATIVE
Influenza B by PCR: NEGATIVE
Resp Syncytial Virus by PCR: NEGATIVE
SARS Coronavirus 2 by RT PCR: NEGATIVE

## 2023-06-04 LAB — TROPONIN I (HIGH SENSITIVITY)
Troponin I (High Sensitivity): 6 ng/L (ref <18)
Troponin I (High Sensitivity): 5 ng/L (ref <18)

## 2023-06-04 MED ORDER — ONDANSETRON HCL 4 MG/2ML IJ SOLN
4.0000 mg | Freq: Once | INTRAMUSCULAR | Status: AC
  Administered 2023-06-04: 4 mg via INTRAVENOUS
  Filled 2023-06-04: qty 2

## 2023-06-04 MED ORDER — CEFTRIAXONE SODIUM 2 G IJ SOLR
2.0000 g | Freq: Once | INTRAMUSCULAR | Status: AC
  Administered 2023-06-04: 2 g via INTRAVENOUS
  Filled 2023-06-04: qty 20

## 2023-06-04 MED ORDER — ACETAMINOPHEN 500 MG PO TABS
1000.0000 mg | ORAL_TABLET | Freq: Once | ORAL | Status: AC
  Administered 2023-06-04: 1000 mg via ORAL
  Filled 2023-06-04: qty 2

## 2023-06-04 MED ORDER — ORAL CARE MOUTH RINSE: 15.0000 mL | OROMUCOSAL | Status: DC | PRN

## 2023-06-04 NOTE — ED Provider Notes (Signed)
Emergency Department Provider Note   I have reviewed the triage vital signs and the nursing notes.   HISTORY  Chief Complaint Chest Pain and Shortness of Breath   HPI Christina Dominguez is a 57 y.o. female with past medical history of asthma, CHF, elevated BMI, hyperlipidemia presents to the emergency department with shortness of breath, chest discomfort, left leg swelling and erythema.  Symptoms began in the past 24 hours.  She noticed the leg swelling and some drainage from an old incision as well in that same timeframe.  Her chest pain feels like a tightness/squeezing.  No sharp/pleuritic pains.  She does arrive with a mild fever.  No change to her medications.  She does remain compliant with her medicines.  She often has flares of asthma and states this feels more like her congestive heart failure symptoms.   Past Medical History:  Diagnosis Date   Anxiety    Aortic atherosclerosis (HCC) 08/19/2021   Arthritis    Asthma    Bipolar 1 disorder (HCC)    Chronic diastolic CHF (congestive heart failure) (HCC)    Class 3 severe obesity with serious comorbidity and body mass index (BMI) greater than or equal to 70 in adult [facility]) 12/21/2019   Depression    Diastolic dysfunction 08/02/2021   Diverticulosis 08/19/2021   Edema of both lower extremities    Hepatic steatosis 08/19/2021   Hepatomegaly 08/19/2021   High blood pressure    Hyperlipidemia    Joint pain    Morbid obesity (HCC)    Pre-diabetes     Review of Systems  Constitutional: No fever/chills Cardiovascular: Positive chest pain. Respiratory: Positive shortness of breath. Gastrointestinal: No abdominal pain.  No nausea, no vomiting.   Musculoskeletal: Negative for back pain. Skin: Negative for rash. Neurological: Negative for headaches.   ____________________________________________   PHYSICAL EXAM:  VITAL SIGNS: ED Triage Vitals  Encounter Vitals Group     BP 06/04/23 1637 (!) 179/74     Pulse Rate  06/04/23 1637 99     Resp 06/04/23 1637 (!) 24     Temp 06/04/23 1637 (!) 100.6 F (38.1 C)     Temp src --      SpO2 06/04/23 1637 95 %     Weight 06/04/23 1640 (!) 400 lb (181.4 kg)     Height 06/04/23 1640 5' (1.524 m)   Constitutional: Alert and oriented. Patient arrives in moderate distress but able to provide a full history.  Eyes: Conjunctivae are normal.  Head: Atraumatic. Nose: No congestion/rhinnorhea. Mouth/Throat: Mucous membranes are moist. Neck: No stridor.   Cardiovascular: Normal rate, regular rhythm. Good peripheral circulation with 1+ DP pulses bilaterally. Grossly normal heart sounds.   Respiratory: Increased respiratory effort.  No retractions. Lungs with crackles at the bases. No wheezing appreciated. Good air entry.  Gastrointestinal: Soft and nontender. No distention.  Musculoskeletal: Asymmetric left leg swelling/erythema. Mild warmth to touch.  Neurologic:  Normal speech and language. No gross focal neurologic deficits are appreciated.  Skin:  Skin is warm, dry and intact. No rash noted. ____________________________________________   LABS (all labs ordered are listed, but only abnormal results are displayed)  Labs Reviewed  COMPREHENSIVE METABOLIC PANEL - Abnormal; Notable for the following components:      Result Value   Sodium 134 (*)    Chloride 93 (*)    Glucose, Bld 105 (*)    BUN 21 (*)    All other components within normal limits  CBC WITH DIFFERENTIAL/PLATELET -  Abnormal; Notable for the following components:   WBC 16.6 (*)    Neutro Abs 14.6 (*)    All other components within normal limits  I-STAT VENOUS BLOOD GAS, ED - Abnormal; Notable for the following components:   pO2, Ven 30 (*)    Bicarbonate 33.8 (*)    TCO2 36 (*)    Acid-Base Excess 7.0 (*)    All other components within normal limits  RESP PANEL BY RT-PCR (RSV, FLU A&B, COVID)  RVPGX2  CULTURE, BLOOD (ROUTINE X 2)  CULTURE, BLOOD (ROUTINE X 2)  BRAIN NATRIURETIC PEPTIDE   LACTIC ACID, PLASMA  LACTIC ACID, PLASMA  URINALYSIS, W/ REFLEX TO CULTURE (INFECTION SUSPECTED)  TROPONIN I (HIGH SENSITIVITY)  TROPONIN I (HIGH SENSITIVITY)   ____________________________________________  EKG   EKG Interpretation Date/Time:  Tuesday June 04 2023 16:42:24 EDT Ventricular Rate:  97 PR Interval:  152 QRS Duration:  76 QT Interval:  382 QTC Calculation: 485 R Axis:   88  Text Interpretation: Normal sinus rhythm Nonspecific ST abnormality Prolonged QT Abnormal ECG When compared with ECG of 31-Jan-2022 14:37, PREVIOUS ECG IS PRESENT Confirmed by Alona Bene [phone]) on 06/04/2023 4:55:25 PM        ____________________________________________  RADIOLOGY  No results found.  ____________________________________________   PROCEDURES  Procedure(s) performed:   Procedures  CRITICAL CARE Performed by: Maia Plan Total critical care time: 35 minutes Critical care time was exclusive of separately billable procedures and treating other patients. Critical care was necessary to treat or prevent imminent or life-threatening deterioration. Critical care was time spent personally by me on the following activities: development of treatment plan with patient and/or surrogate as well as nursing, discussions with consultants, evaluation of patient's response to treatment, examination of patient, obtaining history from patient or surrogate, ordering and performing treatments and interventions, ordering and review of laboratory studies, ordering and review of radiographic studies, pulse oximetry and re-evaluation of patient's condition.  Alona Bene, MD Emergency Medicine  ____________________________________________   INITIAL IMPRESSION / ASSESSMENT AND PLAN / ED COURSE  Pertinent labs & imaging results that were available during my care of the patient were reviewed by me and considered in my medical decision making (see chart for details).   This patient is  Presenting for Evaluation of CP/SOB, which does require a range of treatment options, and is a complaint that involves a high risk of morbidity and mortality.  The Differential Diagnoses includes but is not exclusive to acute coronary syndrome, aortic dissection, pulmonary embolism, cardiac tamponade, community-acquired pneumonia, pericarditis, musculoskeletal chest wall pain, etc.   Critical Interventions-    Medications  acetaminophen (TYLENOL) tablet 1,000 mg (1,000 mg Oral Given 06/04/23 1659)  ondansetron (ZOFRAN) injection 4 mg (4 mg Intravenous Given 06/04/23 1852)  cefTRIAXone (ROCEPHIN) 2 g in sodium chloride 0.9 % 100 mL IVPB (2 g Intravenous New Bag/Given 06/04/23 2014)    Reassessment after intervention: symptoms improved on supplemental O2.   I decided to review pertinent External Data, and in summary no pulmonary visits in our system since 2023.   Clinical Laboratory Tests Ordered, included VBG without respiratory acidosis. No AKI. BNP normal. COVID/FLu negative. Lactic acid negative. Doubt sepsis. CBC with leukocytosis.   Radiologic Tests Ordered, included CXR. I independently interpreted the images and agree with radiology interpretation.   Cardiac Monitor Tracing which shows NSR.    Social Determinants of Health Risk patient is a non-smoker.   Consult complete with [facility], Dr. Julian Reil. Plan for admit.   Medical  Decision Making: Summary:  Patient arrives to the emergency department with chest pain, shortness of breath, unilateral leg swelling/redness.  Unclear if this represents some developing cellulitis, CHF, asthma, PE but all considered.  She is comfortable on 2 L nasal cannula after being moved from the waiting room. Patient does not require immediate intubation or BiPAP.   05:10 PM  Patient reassessed and is comfortable on nasal cannula O2.  VBG shows no severe respiratory acidosis or significant hypercarbia.  Hold on BiPAP for now.   Reevaluation with update and  discussion with patient. She is looking well on reassessment. Plan for abx for now pending CXR and DVT results. She is in agreement.   Patient's presentation is most consistent with acute presentation with potential threat to life or bodily function.   Disposition: admit  ____________________________________________  FINAL CLINICAL IMPRESSION(S) / ED DIAGNOSES  Final diagnoses:  Acute respiratory failure with hypoxia (HCC)  Cellulitis of left lower extremity    Note:  This document was prepared using Dragon voice recognition software and may include unintentional dictation errors.  Alona Bene, MD, [facility] Emergency Medicine    Jannie Doyle, Arlyss Repress, MD 06/04/23 2114

## 2023-06-04 NOTE — ED Triage Notes (Signed)
Pt with SHOB and CP since last night; also concerned about LLE wound that was healed, but opened up again last night; LLE with redness and swelling; dyspnea at rest in triage

## 2023-06-04 NOTE — ED Notes (Signed)
Took pt to bathroom in wheelchair. Pt tolerated well. Pt wanted to walk back to stretcher with walker; pt tolerated well but was SOB. Pt placed back in stretcher and given 4L of O2 until she was back at 100%. Pt now on 3L at 100%

## 2023-06-04 NOTE — ED Notes (Signed)
[facility] called for transport  No Current ETA... ED Nurse will call floor for report  Called @ 20:52

## 2023-06-05 DIAGNOSIS — X58XXXA Exposure to other specified factors, initial encounter: Secondary | ICD-10-CM | POA: Diagnosis present

## 2023-06-05 DIAGNOSIS — E785 Hyperlipidemia, unspecified: Secondary | ICD-10-CM | POA: Diagnosis not present

## 2023-06-05 DIAGNOSIS — I509 Heart failure, unspecified: Secondary | ICD-10-CM | POA: Diagnosis not present

## 2023-06-05 DIAGNOSIS — E66813 Obesity, class 3: Secondary | ICD-10-CM | POA: Diagnosis not present

## 2023-06-05 DIAGNOSIS — M1909 Primary osteoarthritis, other specified site: Secondary | ICD-10-CM | POA: Diagnosis not present

## 2023-06-05 DIAGNOSIS — R531 Weakness: Secondary | ICD-10-CM | POA: Diagnosis not present

## 2023-06-05 DIAGNOSIS — J9601 Acute respiratory failure with hypoxia: Secondary | ICD-10-CM | POA: Diagnosis not present

## 2023-06-05 DIAGNOSIS — I110 Hypertensive heart disease with heart failure: Secondary | ICD-10-CM | POA: Diagnosis not present

## 2023-06-05 DIAGNOSIS — S8012XA Contusion of left lower leg, initial encounter: Secondary | ICD-10-CM | POA: Diagnosis not present

## 2023-06-05 DIAGNOSIS — Z91012 Allergy to eggs: Secondary | ICD-10-CM | POA: Diagnosis not present

## 2023-06-05 DIAGNOSIS — R079 Chest pain, unspecified: Secondary | ICD-10-CM | POA: Diagnosis not present

## 2023-06-05 DIAGNOSIS — L03116 Cellulitis of left lower limb: Secondary | ICD-10-CM

## 2023-06-05 DIAGNOSIS — I5033 Acute on chronic diastolic (congestive) heart failure: Secondary | ICD-10-CM | POA: Diagnosis not present

## 2023-06-05 DIAGNOSIS — E871 Hypo-osmolality and hyponatremia: Secondary | ICD-10-CM | POA: Diagnosis not present

## 2023-06-05 DIAGNOSIS — R072 Precordial pain: Secondary | ICD-10-CM | POA: Diagnosis not present

## 2023-06-05 DIAGNOSIS — R7401 Elevation of levels of liver transaminase levels: Secondary | ICD-10-CM | POA: Diagnosis not present

## 2023-06-05 DIAGNOSIS — I890 Lymphedema, not elsewhere classified: Secondary | ICD-10-CM | POA: Diagnosis not present

## 2023-06-05 DIAGNOSIS — Z887 Allergy status to serum and vaccine status: Secondary | ICD-10-CM | POA: Diagnosis not present

## 2023-06-05 DIAGNOSIS — I700 Atherosclerosis of aorta: Secondary | ICD-10-CM | POA: Diagnosis not present

## 2023-06-05 DIAGNOSIS — I2729 Other secondary pulmonary hypertension: Secondary | ICD-10-CM | POA: Diagnosis not present

## 2023-06-05 DIAGNOSIS — G4733 Obstructive sleep apnea (adult) (pediatric): Secondary | ICD-10-CM | POA: Diagnosis not present

## 2023-06-05 DIAGNOSIS — F419 Anxiety disorder, unspecified: Secondary | ICD-10-CM | POA: Diagnosis not present

## 2023-06-05 DIAGNOSIS — R0902 Hypoxemia: Secondary | ICD-10-CM | POA: Diagnosis not present

## 2023-06-05 DIAGNOSIS — F319 Bipolar disorder, unspecified: Secondary | ICD-10-CM | POA: Diagnosis not present

## 2023-06-05 DIAGNOSIS — J4489 Other specified chronic obstructive pulmonary disease: Secondary | ICD-10-CM | POA: Diagnosis not present

## 2023-06-05 DIAGNOSIS — I5032 Chronic diastolic (congestive) heart failure: Secondary | ICD-10-CM | POA: Diagnosis not present

## 2023-06-05 DIAGNOSIS — Z886 Allergy status to analgesic agent status: Secondary | ICD-10-CM | POA: Diagnosis not present

## 2023-06-05 DIAGNOSIS — Z6841 Body Mass Index (BMI) 40.0 and over, adult: Secondary | ICD-10-CM | POA: Diagnosis not present

## 2023-06-05 DIAGNOSIS — L03119 Cellulitis of unspecified part of limb: Secondary | ICD-10-CM | POA: Diagnosis not present

## 2023-06-05 DIAGNOSIS — Z1152 Encounter for screening for COVID-19: Secondary | ICD-10-CM | POA: Diagnosis not present

## 2023-06-05 LAB — CBC
HCT: 37.5 % (ref 36.0–46.0)
Hemoglobin: 11.7 g/dL — ABNORMAL LOW (ref 12.0–15.0)
MCH: 28.4 pg (ref 26.0–34.0)
MCHC: 31.2 g/dL (ref 30.0–36.0)
MCV: 91 fL (ref 80.0–100.0)
Platelets: 244 10*3/uL (ref 150–400)
RBC: 4.12 MIL/uL (ref 3.87–5.11)
RDW: 15.1 % (ref 11.5–15.5)
WBC: 11.1 10*3/uL — ABNORMAL HIGH (ref 4.0–10.5)
nRBC: 0 % (ref 0.0–0.2)

## 2023-06-05 LAB — D-DIMER, QUANTITATIVE: D-Dimer, Quant: 0.8 ug{FEU}/mL — ABNORMAL HIGH (ref 0.00–0.50)

## 2023-06-05 LAB — BASIC METABOLIC PANEL
Anion gap: 11 (ref 5–15)
BUN: 16 mg/dL (ref 6–20)
CO2: 27 mmol/L (ref 22–32)
Calcium: 8.3 mg/dL — ABNORMAL LOW (ref 8.9–10.3)
Chloride: 97 mmol/L — ABNORMAL LOW (ref 98–111)
Creatinine, Ser: 0.72 mg/dL (ref 0.44–1.00)
GFR, Estimated: >60 mL/min (ref >60)
Glucose, Bld: 110 mg/dL — ABNORMAL HIGH (ref 70–99)
Potassium: 5.1 mmol/L (ref 3.5–5.1)
Sodium: 135 mmol/L (ref 135–145)

## 2023-06-05 LAB — HIV ANTIBODY (ROUTINE TESTING W REFLEX): HIV Screen 4th Generation wRfx: NONREACTIVE

## 2023-06-05 MED ORDER — FUROSEMIDE 40 MG PO TABS
80.0000 mg | ORAL_TABLET | Freq: Every day | ORAL | Status: DC
Start: 2023-06-05 — End: 2023-06-08
  Administered 2023-06-05 – 2023-06-08 (×4): 80 mg via ORAL
  Filled 2023-06-05 (×4): qty 2

## 2023-06-05 MED ORDER — DICLOFENAC SODIUM 1 % EX GEL
1.0000 | CUTANEOUS | Status: DC | PRN
  Filled 2023-06-05: qty 100

## 2023-06-05 MED ORDER — ACETAMINOPHEN 650 MG RE SUPP: 650.0000 mg | Freq: Four times a day (QID) | RECTAL | Status: DC | PRN

## 2023-06-05 MED ORDER — POTASSIUM CHLORIDE CRYS ER 20 MEQ PO TBCR
20.0000 meq | EXTENDED_RELEASE_TABLET | Freq: Every day | ORAL | Status: DC
  Administered 2023-06-05: 20 meq via ORAL
  Filled 2023-06-05: qty 1

## 2023-06-05 MED ORDER — ENOXAPARIN SODIUM 80 MG/0.8ML IJ SOSY
80.0000 mg | PREFILLED_SYRINGE | INTRAMUSCULAR | Status: DC
  Administered 2023-06-05: 80 mg via SUBCUTANEOUS
  Filled 2023-06-05 (×2): qty 0.8

## 2023-06-05 MED ORDER — ACETAMINOPHEN 325 MG PO TABS
650.0000 mg | ORAL_TABLET | Freq: Four times a day (QID) | ORAL | Status: DC | PRN
  Administered 2023-06-05 – 2023-06-06 (×4): 650 mg via ORAL
  Filled 2023-06-05 (×3): qty 2

## 2023-06-05 MED ORDER — SODIUM CHLORIDE 0.9 % IV SOLN: INTRAVENOUS | Status: DC

## 2023-06-05 MED ORDER — SODIUM CHLORIDE 0.9 % IV SOLN
2.0000 g | INTRAVENOUS | Status: DC
  Administered 2023-06-05 – 2023-06-07 (×3): 2 g via INTRAVENOUS
  Filled 2023-06-05 (×2): qty 20

## 2023-06-05 NOTE — Progress Notes (Addendum)
Mobility Specialist Progress Note:   06/05/23 1546  Mobility  Activity Ambulated with assistance in hallway  Level of Assistance Contact guard assist, steadying assist  Assistive Device Front wheel walker  Distance Ambulated (ft) 200 ft  Activity Response Tolerated well  Mobility Referral Yes  $Mobility charge 1 Mobility  Mobility Specialist Start Time (ACUTE ONLY) 1515  Mobility Specialist Stop Time (ACUTE ONLY) 1530  Mobility Specialist Time Calculation (min) (ACUTE ONLY) 15 min   Pt received ambulating from shower into room independently with [place] off, SpO2 80% on RA. Assisted pt via RW around room and in hallway, SpO 90% on 3L. Took 1 standing rest break during session, SpO2 88% on 3L, increased O2 flow,  SpO2 91% on 4L. Returned pt to room, sitting comfortably in chair. NT at bedside, pt left with all needs met, SpO2 96% on 3L .   Feliciana Rossetti Mobility Specialist Please contact via Special educational needs teacher or  Rehab office at [phone]

## 2023-06-05 NOTE — Plan of Care (Signed)
Patient without noted distress. Assisted out of bed to bedside commode; stated she feels better than yesterday. Given PRN Tylenol for a headache. Will continue to monitor.   Problem: Education: Goal: Knowledge of General Education information will improve Description: Including pain rating scale, medication(s)/side effects and non-pharmacologic comfort measures Outcome: Progressing   Problem: Health Behavior/Discharge Planning: Goal: Ability to manage health-related needs will improve Outcome: Progressing   Problem: Clinical Measurements: Goal: Ability to maintain clinical measurements within normal limits will improve Outcome: Progressing Goal: Will remain free from infection Outcome: Progressing Goal: Diagnostic test results will improve Outcome: Progressing Goal: Respiratory complications will improve Outcome: Progressing Goal: Cardiovascular complication will be avoided Outcome: Progressing   Problem: Activity: Goal: Risk for activity intolerance will decrease Outcome: Progressing   Problem: Nutrition: Goal: Adequate nutrition will be maintained Outcome: Progressing   Problem: Coping: Goal: Level of anxiety will decrease Outcome: Progressing   Problem: Elimination: Goal: Will not experience complications related to bowel motility Outcome: Progressing Goal: Will not experience complications related to urinary retention Outcome: Progressing   Problem: Pain Management: Goal: General experience of comfort will improve Outcome: Progressing   Problem: Safety: Goal: Ability to remain free from injury will improve Outcome: Progressing   Problem: Skin Integrity: Goal: Risk for impaired skin integrity will decrease Outcome: Progressing

## 2023-06-05 NOTE — Progress Notes (Signed)
    Patient: RAYOLA FLYTHE YQM:[id number] DOB: [birth year]      Brief hospital course: 57 y.o. F with morbid obesity, HTN, dCHF, pHTN, chronic bronchitis, and lymphedema who presented with redness, pain and drainage of the left leg.    This is a no charge note, for further details, please see the H&P by my partner, Dr. Loney Loh from earlier today.   Principal Problem:   Cellulitis Active Problems:   CHF (congestive heart failure) (HCC)   Chest discomfort   Hyponatremia    Chest pain resolved overnight.    Right leg redness improving with antibiotics.  Suspect this is all cellulitis at the site of her old hematoma.       Physical Exam: BP (!) 140/84 (BP Location: Left Arm)   Pulse 85   Temp 99 F (37.2 C) (Oral)   Resp 17   Ht 5' (1.524 m)   Wt (!) 160.5 kg   SpO2 100%   BMI 69.10 kg/m   Patient seen and examined.  Redness at the left lower leg, improving.   Family Communication:         Author: Alberteen Sam, MD 06/05/2023 3:06 PM

## 2023-06-05 NOTE — H&P (Signed)
History and Physical    SCOTTLYNN NUSSER ZHY:[id number] DOB: [birth year] DOA: 06/04/2023  PCP: Bary Leriche, PA-C  Patient coming from: Home  Chief Complaint: Shortness of breath  HPI: Christina Dominguez is a 57 y.o. female with medical history significant of hypertension, hyperlipidemia, asthma, chronic bronchitis, chronic HFpEF, pulmonary hypertension, anxiety, depression, diverticulosis, morbid obesity, OSA, chronic left lower extremity wound presented to the ED with complaints of shortness of breath, chest pain, left leg swelling and erythema.  Temperature 100.6 F and dyspneic with minimal exertion.  Oxygen saturation recorded as 95% on room air on arrival to the ED, later placed on 4 L supplemental oxygen.  Labs showing WBC 16.6, hemoglobin 12.7, sodium 134, chloride 93, creatinine 0.8, blood cultures collected, troponin negative x 2, BNP normal, lactate normal x 2, COVID/influenza/RSV PCR negative, VBG with pH 7.37 and pCO2 58.5.  Chest x-ray showing no active disease.  Left lower extremity Doppler ultrasound showing no obvious DVT. Patient was given Tylenol, Zofran, and ceftriaxone in the ED.  Patient reports history of chronic left lower leg wound.  Since yesterday she noticed that her left lower leg is more swollen, erythematous, and painful.  She noticed some clear drainage from her leg wound.  She was having fevers.  Also since yesterday she is having substernal chest pain which feels like heaviness and shortness of breath.  She reports history of OSA for which she uses nocturnal oxygen, not on CPAP.  She is not sure how much oxygen she uses at night.  Review of Systems:  Review of Systems  All other systems reviewed and are negative.   Past Medical History:  Diagnosis Date   Anxiety    Aortic atherosclerosis (HCC) 08/19/2021   Arthritis    Asthma    Bipolar 1 disorder (HCC)    Chronic diastolic CHF (congestive heart failure) (HCC)    Class 3 severe obesity with serious  comorbidity and body mass index (BMI) greater than or equal to 70 in adult [facility]) 12/21/2019   Depression    Diastolic dysfunction 08/02/2021   Diverticulosis 08/19/2021   Edema of both lower extremities    Hepatic steatosis 08/19/2021   Hepatomegaly 08/19/2021   High blood pressure    Hyperlipidemia    Joint pain    Morbid obesity (HCC)    Pre-diabetes     Past Surgical History:  Procedure Laterality Date   APPLICATION OF WOUND VAC Left 12/20/2021   Procedure: APPLICATION OF WOUND VAC;  Surgeon: Nadara Mustard, MD;  Location: [facility] OR;  Service: Orthopedics;  Laterality: Left;   I & D EXTREMITY Left 12/20/2021   Procedure: EXCISIONAL DEBRIDEMENT HEMATOMA LEFT LEG;  Surgeon: Nadara Mustard, MD;  Location: [facility] OR;  Service: Orthopedics;  Laterality: Left;   NO PAST SURGERIES     RIGHT/LEFT HEART CATH AND CORONARY ANGIOGRAPHY N/A 10/06/2021   Procedure: RIGHT/LEFT HEART CATH AND CORONARY ANGIOGRAPHY;  Surgeon: Corky Crafts, MD;  Location: [facility] INVASIVE CV LAB;  Service: Cardiovascular;  Laterality: N/A;     reports that she has never smoked. She has never been exposed to tobacco smoke. She has never used smokeless tobacco. She reports that she does not currently use alcohol. She reports that she does not use drugs.  Allergies  Allergen Reactions   Aspirin Nausea And Vomiting   Egg-Derived Products Swelling   Influenza Vaccines Swelling    Family History  Problem Relation Age of Onset   Heart Problems Mother  Broken heart syndrome   Hypertension Mother    Hypercholesterolemia Mother    Bone cancer Father    Testicular cancer Father    Heart attack Sister    Diabetes Brother    Colon cancer Maternal Grandfather    Dementia Paternal Grandmother     Prior to Admission medications   Medication Sig Start Date End Date Taking? Authorizing Provider  acetaminophen (TYLENOL) 650 MG CR tablet Take 1,300 mg by mouth every 8 (eight) hours as needed for pain.    [provider]  albuterol (VENTOLIN HFA) 108 (90 Base) MCG/ACT inhaler Inhale 2 puffs into the lungs every 6 (six) hours as needed for wheezing or shortness of breath. 01/30/22   Luciano Cutter, MD  CATS CLAW, UNCARIA TOMENTOSA, PO Take 2 capsules by mouth daily.    [provider]  Cholecalciferol (VITAMIN D) 50 MCG (2000 UT) tablet Take 2,000 Units by mouth daily. Patient not taking: Reported on 07/03/2022    [provider]  COVID-19 mRNA vaccine [phone] (COMIRNATY) syringe Inject into the muscle. 07/03/22     diclofenac Sodium (VOLTAREN) 1 % GEL Apply 2 g topically daily as needed (pain).    [provider]  dicyclomine (BENTYL) 10 MG capsule Take 1 capsule (10 mg total) by mouth 4 (four) times daily -  before meals and at bedtime. 10/11/22 01/09/23  Allwardt, Crist Infante, PA-C  diphenhydrAMINE (BENADRYL) 25 MG tablet Take 50 mg by mouth at bedtime as needed for sleep.    [provider]  furosemide (LASIX) 80 MG tablet TAKE 1 TABLET(80 MG) BY MOUTH DAILY 04/10/23   Georgeanna Lea, MD  OVER THE COUNTER MEDICATION Take 3 tablets by mouth daily. Mobility essentials supplement    [provider]  potassium chloride SA (KLOR-CON M) 20 MEQ tablet Take 1 tablet (20 mEq total) by mouth daily. 12/18/21   Georgeanna Lea, MD  TURMERIC PO Take 1 tablet by mouth daily.    [provider]  valsartan (DIOVAN) 40 MG tablet Take 1 tablet (40 mg total) by mouth daily. 10/11/22   Georgeanna Lea, MD    Physical Exam: Vitals:   06/04/23 2100 06/04/23 2123 06/04/23 2220 06/04/23 2240  BP: (!) 148/84  (!) 143/85   Pulse: 91  94   Resp: (!) 22  18   Temp:  98.2 F (36.8 C) (!) 97.5 F (36.4 C)   TempSrc:  Oral Oral   SpO2: 99%  98%   Weight:    (!) 160.5 kg  Height:    5' (1.524 m)    Physical Exam Vitals reviewed.  Constitutional:      General: She is not in acute distress. HENT:     Head: Normocephalic and atraumatic.  Eyes:      Extraocular Movements: Extraocular movements intact.  Cardiovascular:     Rate and Rhythm: Normal rate and regular rhythm.     Pulses: Normal pulses.  Pulmonary:     Effort: Pulmonary effort is normal. No respiratory distress.     Breath sounds: Normal breath sounds. No wheezing or rales.  Abdominal:     General: Bowel sounds are normal. There is no distension.     Palpations: Abdomen is soft.     Tenderness: There is no abdominal tenderness.  Musculoskeletal:     Cervical back: Normal range of motion.     Right lower leg: No edema.     Left lower leg: Edema present.  Comments: Left lower leg swollen, erythematous, and warm to touch.  No obvious drainage from her chronic wound on the anterolateral aspect of the lower leg.  Skin:    General: Skin is warm and dry.  Neurological:     General: No focal deficit present.     Mental Status: She is alert and oriented to person, place, and time.     Labs on Admission: I have personally reviewed following labs and imaging studies  CBC: Recent Labs  Lab 06/04/23 1654 06/04/23 1703  WBC 16.6*  --   NEUTROABS 14.6*  --   HGB 12.7 13.9  HCT 39.2 41.0  MCV 90.3  --   PLT 311  --    Basic Metabolic Panel: Recent Labs  Lab 06/04/23 1654 06/04/23 1703  NA 134* 136  K 3.7 3.7  CL 93*  --   CO2 29  --   GLUCOSE 105*  --   BUN 21*  --   CREATININE 0.85  --   CALCIUM 8.9  --    GFR: Estimated Creatinine Clearance: 104.2 mL/min (by C-G formula based on SCr of 0.85 mg/dL). Liver Function Tests: Recent Labs  Lab 06/04/23 1654  AST 34  ALT 29  ALKPHOS 115  BILITOT 0.8  PROT 7.6  ALBUMIN 3.5   No results for input(s): "LIPASE", "AMYLASE" in the last 168 hours. No results for input(s): "AMMONIA" in the last 168 hours. Coagulation Profile: No results for input(s): "INR", "PROTIME" in the last 168 hours. Cardiac Enzymes: No results for input(s): "CKTOTAL", "CKMB", "CKMBINDEX", "TROPONINI" in the last 168 hours. BNP (last 3  results) No results for input(s): "PROBNP" in the last 8760 hours. HbA1C: No results for input(s): "HGBA1C" in the last 72 hours. CBG: No results for input(s): "GLUCAP" in the last 168 hours. Lipid Profile: No results for input(s): "CHOL", "HDL", "LDLCALC", "TRIG", "CHOLHDL", "LDLDIRECT" in the last 72 hours. Thyroid Function Tests: No results for input(s): "TSH", "T4TOTAL", "FREET4", "T3FREE", "THYROIDAB" in the last 72 hours. Anemia Panel: No results for input(s): "VITAMINB12", "FOLATE", "FERRITIN", "TIBC", "IRON", "RETICCTPCT" in the last 72 hours. Urine analysis:    Component Value Date/Time   COLORURINE YELLOW 08/18/2021 1253   APPEARANCEUR CLEAR 08/18/2021 1253   LABSPEC 1.010 08/18/2021 1253   PHURINE 5.5 08/18/2021 1253   GLUCOSEU NEGATIVE 08/18/2021 1253   HGBUR TRACE (A) 08/18/2021 1253   BILIRUBINUR NEGATIVE 08/18/2021 1253   KETONESUR NEGATIVE 08/18/2021 1253   PROTEINUR NEGATIVE 08/18/2021 1253   NITRITE NEGATIVE 08/18/2021 1253   LEUKOCYTESUR NEGATIVE 08/18/2021 1253    Radiological Exams on Admission: US Venous Img Lower  Left (DVT Study)  Result Date: 06/04/2023 CLINICAL DATA:  Left leg swelling and shortness of breath. EXAM: LEFT LOWER EXTREMITY VENOUS DOPPLER ULTRASOUND TECHNIQUE: Gray-scale sonography with graded compression, as well as color Doppler and duplex ultrasound were performed to evaluate the lower extremity deep venous systems from the level of the common femoral vein and including the common femoral, femoral, profunda femoral, popliteal and calf veins including the posterior tibial, peroneal and gastrocnemius veins when visible. The superficial great saphenous vein was also interrogated. Spectral Doppler was utilized to evaluate flow at rest and with distal augmentation maneuvers in the common femoral, femoral and popliteal veins. COMPARISON:  January 31, 2022 FINDINGS: Contralateral Common Femoral Vein: Respiratory phasicity is normal and symmetric with  the symptomatic side. No evidence of thrombus. Normal compressibility. Common Femoral Vein: No evidence of thrombus. Normal compressibility, respiratory phasicity and response to augmentation. Saphenofemoral  Junction: No evidence of thrombus. Normal compressibility and flow on color Doppler imaging. Profunda Femoral Vein: No evidence of thrombus. Normal compressibility and flow on color Doppler imaging. Femoral Vein: No evidence of thrombus. Normal compressibility, respiratory phasicity and response to augmentation. Popliteal Vein: No evidence of thrombus. Normal compressibility, respiratory phasicity and response to augmentation. Calf Veins: The LEFT posterior tibial vein and LEFT peroneal vein are not visualized. Superficial Great Saphenous Vein: No evidence of thrombus. Normal compressibility. Venous Reflux:  None. Other Findings: Limited study secondary to the patient's body habitus. IMPRESSION: Limited evaluation of the LEFT posterior tibial vein and LEFT peroneal vein, without evidence of DVT within the LEFT lower extremity. Electronically Signed   By: Aram Candela M.D.   On: 06/04/2023 20:56   DG Chest Portable 1 View  Result Date: 06/04/2023 CLINICAL DATA:  SOB SHOB and CP since last night; also concerned about LLE wound that was healed, but opened up again last night; LLE with redness and swelling. EXAM: PORTABLE CHEST 1 VIEW COMPARISON:  Chest x-ray 01/31/2022 FINDINGS: The heart and mediastinal contours are unchanged. No focal consolidation. No pulmonary edema. No pleural effusion. No pneumothorax. No acute osseous abnormality. IMPRESSION: No active disease. Electronically Signed   By: Tish Frederickson M.D.   On: 06/04/2023 20:51    EKG: Independently reviewed.  Sinus rhythm, no acute ischemic changes.  Assessment and Plan  Chest pain and shortness of breath Oxygen saturation recorded as 95% on room air on arrival to the ED, later placed on 4 L supplemental oxygen.  Unclear whether patient  desatted in the ED or whether it was placed for comfort.  She does have sleep apnea and uses nocturnal oxygen.  Currently stable on 3 L [place].  Blood gas without evidence of hypercapnia.  No wheezing and lungs clear on exam.  COVID/influenza/RSV PCR negative.  Chest x-ray showing no active disease.  ?Acute PE. Given unilateral left lower extremity edema, Doppler was done which is showing no obvious DVT although examination of left posterior tibial vein and left peroneal vein limited.  Check stat D-dimer, if elevated, then stat CTA chest to rule out PE.  Troponin negative x 2 and not consistent with ACS.  BNP normal.  Suspected left lower extremity cellulitis Temperature 100.6 F on arrival to the ED.  WBC count 16.6.  No tachycardia or hypotension.  Lactate normal x 2.  Continue ceftriaxone and follow-up blood cultures.  Trend WBC count.  Tylenol as needed for fevers.  Mild hyponatremia Gentle IV fluid hydration and monitor labs.  Chronic HFpEF Last echo done in January 2023 showing EF 55 to 60%, grade 1 diastolic dysfunction.  No signs of volume overload, BNP normal.  Hypertension Hyperlipidemia Asthma: Stable, no signs of acute exacerbation. Anxiety and depression Pharmacy med rec pending.  DVT prophylaxis: Lovenox Code Status: Full Code (discussed with the patient) Level of care: Telemetry bed Admission status: It is my clinical opinion that referral for OBSERVATION is reasonable and necessary in this patient based on the above information provided. The aforementioned taken together are felt to place the patient at high risk for further clinical deterioration. However, it is anticipated that the patient may be medically stable for discharge from the hospital within 24 to 48 hours.  John Giovanni MD [facility]  If 7PM-7AM, please contact night-coverage [email or web address]  06/05/2023, 1:43 AM

## 2023-06-05 NOTE — Hospital Course (Signed)
57 y.o. F with morbid obesity, HTN, dCHF, pHTN, chronic bronchitis, and lymphedema who presented with redness, pain and drainage of the left leg.

## 2023-06-06 DIAGNOSIS — E785 Hyperlipidemia, unspecified: Secondary | ICD-10-CM | POA: Diagnosis not present

## 2023-06-06 DIAGNOSIS — E66813 Obesity, class 3: Secondary | ICD-10-CM

## 2023-06-06 DIAGNOSIS — I5032 Chronic diastolic (congestive) heart failure: Secondary | ICD-10-CM | POA: Diagnosis not present

## 2023-06-06 DIAGNOSIS — L03116 Cellulitis of left lower limb: Secondary | ICD-10-CM | POA: Diagnosis not present

## 2023-06-06 DIAGNOSIS — R7401 Elevation of levels of liver transaminase levels: Secondary | ICD-10-CM | POA: Insufficient documentation

## 2023-06-06 DIAGNOSIS — R079 Chest pain, unspecified: Secondary | ICD-10-CM | POA: Diagnosis not present

## 2023-06-06 DIAGNOSIS — Z6841 Body Mass Index (BMI) 40.0 and over, adult: Secondary | ICD-10-CM | POA: Diagnosis not present

## 2023-06-06 LAB — CBC
HCT: 36.4 % (ref 36.0–46.0)
Hemoglobin: 11.3 g/dL — ABNORMAL LOW (ref 12.0–15.0)
MCH: 28.9 pg (ref 26.0–34.0)
MCHC: 31 g/dL (ref 30.0–36.0)
MCV: 93.1 fL (ref 80.0–100.0)
Platelets: 240 10*3/uL (ref 150–400)
RBC: 3.91 MIL/uL (ref 3.87–5.11)
RDW: 14.9 % (ref 11.5–15.5)
WBC: 7.8 10*3/uL (ref 4.0–10.5)
nRBC: 0 % (ref 0.0–0.2)

## 2023-06-06 LAB — COMPREHENSIVE METABOLIC PANEL
ALT: 48 U/L — ABNORMAL HIGH (ref 0–44)
AST: 46 U/L — ABNORMAL HIGH (ref 15–41)
Albumin: 2.7 g/dL — ABNORMAL LOW (ref 3.5–5.0)
Alkaline Phosphatase: 102 U/L (ref 38–126)
Anion gap: 9 (ref 5–15)
BUN: 10 mg/dL (ref 6–20)
CO2: 29 mmol/L (ref 22–32)
Calcium: 7.8 mg/dL — ABNORMAL LOW (ref 8.9–10.3)
Chloride: 99 mmol/L (ref 98–111)
Creatinine, Ser: 0.75 mg/dL (ref 0.44–1.00)
GFR, Estimated: >60 mL/min (ref >60)
Glucose, Bld: 105 mg/dL — ABNORMAL HIGH (ref 70–99)
Potassium: 4 mmol/L (ref 3.5–5.1)
Sodium: 137 mmol/L (ref 135–145)
Total Bilirubin: 0.5 mg/dL (ref 0.3–1.2)
Total Protein: 6.4 g/dL — ABNORMAL LOW (ref 6.5–8.1)

## 2023-06-06 MED ORDER — ENOXAPARIN SODIUM 80 MG/0.8ML IJ SOSY: 80.0000 mg | PREFILLED_SYRINGE | INTRAMUSCULAR | Status: DC

## 2023-06-06 MED ORDER — DIPHENHYDRAMINE HCL 25 MG PO CAPS
25.0000 mg | ORAL_CAPSULE | Freq: Two times a day (BID) | ORAL | Status: AC
  Administered 2023-06-07 (×2): 25 mg via ORAL
  Filled 2023-06-06 (×2): qty 1

## 2023-06-06 MED ORDER — ENOXAPARIN SODIUM 80 MG/0.8ML IJ SOSY
80.0000 mg | PREFILLED_SYRINGE | INTRAMUSCULAR | Status: DC
Start: 2023-06-06 — End: 2023-06-08
  Administered 2023-06-06 – 2023-06-08 (×3): 80 mg via SUBCUTANEOUS
  Filled 2023-06-06 (×2): qty 0.8

## 2023-06-06 MED ORDER — FUROSEMIDE 10 MG/ML IJ SOLN
60.0000 mg | Freq: Once | INTRAMUSCULAR | Status: AC
  Administered 2023-06-06: 60 mg via INTRAVENOUS
  Filled 2023-06-06: qty 6

## 2023-06-06 NOTE — Assessment & Plan Note (Signed)
Fluid status hard to assess.  Increased O2 needs likely due to pulmonary hypertension, not true decompensation. - Continue Lasix - Additional dose IV Lasix today

## 2023-06-06 NOTE — Progress Notes (Signed)
Name: Christina Dominguez DOB: [birth year]  Please be advised that the above-named patient will require a short-term nursing home stay -- anticipated 30 days or less for rehabilitation and strengthening. The plan is for return home.

## 2023-06-06 NOTE — Assessment & Plan Note (Signed)
Troponin negative.  Low suspicion for ischemia.  D-dimer minimally elevated, doubt PE given resolution of symptoms with antibiotics.

## 2023-06-06 NOTE — TOC Initial Note (Addendum)
Transition of Care [facility]) - Initial/Assessment Note    Patient Details  Name: Christina Dominguez MRN: [id number] Date of Birth: [birth year]  Transition of Care [facility]) CM/SW Contact:    Gibson Lad A [place], Theresia Majors Phone Number: 06/06/2023, 3:51 PM  Clinical Narrative:                  CSW met with pt at bedside. She is agreeable to SNF workup and CSW sent referrals to facilities. Bed offers pending. CSW notified pt possibly stable for DC tomorrow.   TOC will continue to follow.   Expected Discharge Plan: Skilled Nursing Facility Barriers to Discharge: SNF Pending bed offer, Continued Medical Work up, English as a second language teacher   Patient Goals and CMS Choice            Expected Discharge Plan and Services       Living arrangements for the past 2 months: Single Family Home                                      Prior Living Arrangements/Services Living arrangements for the past 2 months: Single Family Home Lives with:: Self                   Activities of Daily Living   ADL Screening (condition at time of admission) Independently performs ADLs?: Yes (appropriate for developmental age) Is the patient deaf or have difficulty hearing?: No Does the patient have difficulty seeing, even when wearing glasses/contacts?: No Does the patient have difficulty concentrating, remembering, or making decisions?: No  Permission Sought/Granted                  Emotional Assessment Appearance:: Appears stated age Attitude/Demeanor/Rapport: Engaged Affect (typically observed): Appropriate Orientation: : Oriented to Self, Oriented to  Time, Oriented to Place, Oriented to Situation Alcohol / Substance Use: Not Applicable Psych Involvement: No (comment)  Admission diagnosis:  Cellulitis of leg [L03.119] Cellulitis of left lower extremity [L03.116] Acute respiratory failure with hypoxia (HCC) [J96.01] Cellulitis [L03.90] Patient Active Problem List   Diagnosis Date Noted    Transaminitis 06/06/2023   Chest pain 06/05/2023   Cellulitis of left lower extremity 06/04/2023   Nocturnal hypoxemia 07/03/2022   Arthritis 04/16/2022   Asthma 04/16/2022   Hematoma of left lower leg    Wheezing 11/23/2021   Snoring 11/23/2021   Other secondary pulmonary hypertension (HCC) 11/23/2021   Hyponatremia 10/11/2021   Dyslipidemia 10/04/2021   Mediastinal mass 10/04/2021   Obstructive sleep apnea 08/25/2021   RUQ pain 08/19/2021   Hepatic steatosis 08/19/2021   Hepatomegaly 08/19/2021   Aortic atherosclerosis (HCC) 08/19/2021   Diverticulosis 08/19/2021   Anxiety 08/02/2021   Bipolar 1 disorder (HCC) 08/02/2021   Chronic diastolic CHF (congestive heart failure) (HCC) 08/02/2021   Depression 08/02/2021   Joint pain 08/02/2021   Screening for malignant neoplasm of colon 08/02/2021   Diastolic dysfunction 08/02/2021   Prediabetes 12/22/2019   Vitamin D insufficiency 12/22/2019   Class 3 severe obesity with serious comorbidity and body mass index (BMI) greater than or equal to 60 in adult [facility]) 12/21/2019   Essential hypertension 04/30/2019   Mild intermittent asthma without complication 04/30/2019   PCP:  Allwardt, Crist Infante, PA-C Pharmacy:   [facility] [phone] [place], [place] - [address] AT [facility] [address] [place] [address] Phone: [phone] Fax: [phone]  Patrcia Dolly  [facility] [address] [place] [address] Phone: [phone] Fax: [phone]     Social Determinants of Health (SDOH) Social History: SDOH Screenings   Food Insecurity: No Food Insecurity (06/04/2023)  Housing: Patient Declined (06/04/2023)  Transportation Needs: No Transportation Needs (06/04/2023)  Utilities: Not At Risk (06/04/2023)  Depression (PHQ2-9): High Risk (08/30/2021)  Financial Resource Strain: Medium Risk (10/05/2021)  Tobacco Use: Low Risk  (06/04/2023)   SDOH Interventions:     Readmission Risk  Interventions     No data to display

## 2023-06-06 NOTE — Assessment & Plan Note (Signed)
BMI 69.3

## 2023-06-06 NOTE — Progress Notes (Signed)
Mobility Specialist Progress Note:   06/06/23 1235  Mobility  Activity Ambulated with assistance in hallway;Ambulated with assistance to bathroom;Ambulated with assistance in room  Level of Assistance Minimal assist, patient does 75% or more  Assistive Device Front wheel walker  Distance Ambulated (ft) 200 ft  Activity Response Tolerated well  Mobility Referral Yes  $Mobility charge 1 Mobility  Mobility Specialist Start Time (ACUTE ONLY) 1235  Mobility Specialist Stop Time (ACUTE ONLY) 1250  Mobility Specialist Time Calculation (min) (ACUTE ONLY) 15 min   Pt received in chair, agreeable to mobility session. SBA required for STS via RW. Tolerated well, audible SOB throughout. Requested to use bathroom at start of session. Void successful. Required MinA to stand from toilet. CGA to safely ambulate in hallway. SpO2 88% on 3L halfway through session, recovered by taking one standing rest break, SpO2 98% on 3L. Returned pt to room, SpO2 100% on 3L. Left pt with all needs met, sitting comfortably in chair.   Feliciana Rossetti Mobility Specialist Please contact via Special educational needs teacher or  Rehab office at [phone]

## 2023-06-06 NOTE — Evaluation (Addendum)
Physical Therapy Evaluation Patient Details Name: Christina Dominguez MRN: [id number] DOB: 12-07-1965 Today's Date: 06/06/2023  History of Present Illness  Pt is a 57 y/o F admitted on 06/04/23 after presenting with redness, pain & drainage of the LLE. Pt is being treated for cellulitis. PMH: morbid obesity, HTN, dCHF, pHTN, chronic bronchitis, lymphedema, anxiety, aortic atherosclerosis, bipolar 1 disorder, depression  Clinical Impression  Pt seen for PT evaluation with pt agreeable to tx. Pt reports prior to admission she was mod I with SPC, primarily ambulating household distances (would use upright walker on the rare occasion that she leaves the house). On this date, pt is able to transfer STS from recliner & toilet with mod I, but walks in hallway with RW & supervision. Pt frequently attempting to lean on elbows on RW with PT providing ongoing cuing re: upright posture. Pt presents with decreased activity tolerance, decreased balance. Pt would benefit from ongoing PT services to address deficits noted below to increase independence with mobility.  Pt received on room air, SpO2 >/= 90% Attempted gait on room air but pt required 2L/min to maintain >/= 90% (dropped to 87% on room air) Pt left on room air at end of session, SpO2 93% Pt reports she only uses O2 at night (unsure of how much).        If plan is discharge home, recommend the following: A little help with walking and/or transfers;A little help with bathing/dressing/bathroom;Assistance with cooking/housework;Assist for transportation;Help with stairs or ramp for entrance   Can travel by private vehicle   Yes    Equipment Recommendations None recommended by PT  Recommendations for Other Services    OT consult   Functional Status Assessment Patient has had a recent decline in their functional status and demonstrates the ability to make significant improvements in function in a reasonable and predictable amount of time.      Precautions / Restrictions Precautions Precautions: Fall Precaution Comments: monitor O2 Restrictions Weight Bearing Restrictions: No      Mobility  Bed Mobility               General bed mobility comments: not tested, pt received & left sitting in recliner but reports she's required help to get in bed    Transfers Overall transfer level: Modified independent Equipment used: Rolling walker (2 wheels)               General transfer comment: STS from recliner, toilet    Ambulation/Gait Ambulation/Gait assistance: Supervision Gait Distance (Feet):  (10 ft + 85 ft) Assistive device: Rolling walker (2 wheels) Gait Pattern/deviations: Decreased step length - right, Decreased step length - left, Decreased stride length Gait velocity: decreased     General Gait Details: Cuing to upright trunk & not lean on RW with elbows. Standing rest breaks PRN.  Stairs            Wheelchair Mobility     Tilt Bed    Modified Rankin (Stroke Patients Only)       Balance Overall balance assessment: Needs assistance Sitting-balance support: Feet supported Sitting balance-Leahy Scale: Fair     Standing balance support: During functional activity, Bilateral upper extremity supported, Reliant on assistive device for balance Standing balance-Leahy Scale: Poor                               Pertinent Vitals/Pain Pain Assessment Pain Assessment: No/denies pain    Home Living Family/patient  expects to be discharged to:: Private residence Living Arrangements: Alone   Type of Home: House Home Access: Stairs to enter   [employer] of Steps: 2   Home Layout: One level Home Equipment: Agricultural consultant (2 wheels);Cane - single point ("upright walker")      Prior Function               Mobility Comments: Denies falls in the last 6 months but notes falls prior to that. Pt reports she's mod I with SPC in the home, uses upright walker outside  of the home but hardly leaves her house 2/2 difficulty negotiating stairs. ADLs Comments: Cooks, cleans, bathes, & dresses herself. 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     Extremity/Trunk Assessment   Upper Extremity Assessment Upper Extremity Assessment: Overall WFL for tasks assessed    Lower Extremity Assessment Lower Extremity Assessment: Generalized weakness (BLE edema (L>R))       Communication   Communication Communication: No apparent difficulties  Cognition Arousal: Alert Behavior During Therapy: WFL for tasks assessed/performed, Impulsive Overall Cognitive Status: Within Functional Limits for tasks assessed                                 General Comments: slightly decreased awareness of safety/location of lines when turning.        General Comments General comments (skin integrity, edema, etc.): Used restroom without assistance during session.    Exercises     Assessment/Plan    PT Assessment Patient needs continued PT services  PT Problem List Decreased strength;Cardiopulmonary status limiting activity;Decreased activity tolerance;Decreased balance;Decreased mobility;Decreased knowledge of use of DME;Decreased safety awareness       PT Treatment Interventions DME instruction;Balance training;Modalities;Gait training;Neuromuscular re-education;Stair training;Functional mobility training;Patient/family education;Therapeutic activities;Therapeutic exercise;Manual techniques    PT Goals (Current goals can be found in the Care Plan section)  Acute Rehab PT Goals Patient Stated Goal: get better, rehab PT Goal Formulation: With patient Time For Goal Achievement: 06/14/23 Potential to Achieve Goals: Good    Frequency Min 1X/week     Co-evaluation               AM-PAC PT "6 Clicks" Mobility  Outcome Measure Help needed turning from your back to your side while in a flat bed without using bedrails?: A Little Help needed  moving from lying on your back to sitting on the side of a flat bed without using bedrails?: A Little Help needed moving to and from a bed to a chair (including a wheelchair)?: A Little Help needed standing up from a chair using your arms (e.g., wheelchair or bedside chair)?: None Help needed to walk in hospital room?: A Little Help needed climbing 3-5 steps with a railing? : A Lot 6 Click Score: 18    End of Session Equipment Utilized During Treatment: Oxygen Activity Tolerance: Patient tolerated treatment well Patient left: in chair;with call bell/phone within reach Nurse Communication:  (O2 needs) PT Visit Diagnosis: Muscle weakness (generalized) (M62.81);Other abnormalities of gait and mobility (R26.89);Difficulty in walking, not elsewhere classified (R26.2)    Time: 0454-0981 PT Time Calculation (min) (ACUTE ONLY): 24 min   Charges:   PT Evaluation $PT Eval Low Complexity: 1 Low   PT General Charges $$ ACUTE PT VISIT: 1 Visit         Aleda Grana, PT, DPT 06/06/23, 2:20 PM   Sandi Mariscal 06/06/2023, 2:18 PM

## 2023-06-06 NOTE — Assessment & Plan Note (Signed)
Not on statin 

## 2023-06-06 NOTE — Progress Notes (Addendum)
  Progress Note   Patient: Christina Dominguez ION:[id number] DOB: 1966-05-29 DOA: 06/04/2023     1 DOS: the patient was seen and examined on 06/06/2023 at 8:41 AM      Brief hospital course: 57 y.o. F with morbid obesity, HTN, dCHF, pHTN, chronic bronchitis, and lymphedema who presented with redness, pain and drainage of the left leg.     Assessment and Plan: * Cellulitis of left lower extremity WBC improving, still substantial pain and swelling.  Physical therapy recommended today that the patient not discharge home due to pain and swelling limiting her activity tolerance and safety at home. At the moment I have low suspicion for fluid collection or bone involvement. - Continue IV Rocephin    Chest pain Troponin negative.  Low suspicion for ischemia.  D-dimer minimally elevated, doubt PE given resolution of symptoms with antibiotics.  Mild acute on chronic diastolic CHF (congestive heart failure) (HCC) Fluid status hard to assess.  At baseline, patient is not on O2, but today desaturated to 87% with ambulation.   - Continue Lasix - Additional dose IV Lasix today    Transaminitis Mild. - Follow up with PCP patient    Other secondary pulmonary hypertension (HCC)    Hyponatremia Resolved  Dyslipidemia Not on statin  Obstructive sleep apnea - CPAP at night  Class 3 severe obesity with serious comorbidity and body mass index (BMI) greater than or equal to 60 in adult (HCC) BMI 69.3  Essential hypertension Blood pressure normal - Continue Lasix - Hold valsartan          Subjective: Leg still has pain, swelling, redness, but these are improving, still too painful to mobilize independently.  She has no shortness of breath, orthopnea, does not feel swollen elsewhere other than the left leg.  No fever.     Physical Exam: BP 115/60 (BP Location: Left Arm)   Pulse 76   Temp 98.6 F (37 C) (Oral)   Resp 16   Ht 5' (1.524 m)   Wt (!) 161 kg   SpO2 100%    BMI 69.33 kg/m   Obese adult female, lying in bed, interactive and appropriate RRR, no murmurs, she has lymphedema bilaterally Respiratory rate seems normal, lungs diminished due to obesity but no rales or wheezes appreciated Abdomen soft no tenderness palpation Attention normal, affect appropriate, judgment and insight appear normal The left leg has continued redness, quite tender to palpation.  Data Reviewed: Potassium resulted normal, sodium resolved normal, creatinine stable White blood cell count down to normal  Family Communication: None    Disposition: Status is: Inpatient         Author: Alberteen Sam, MD 06/06/2023 3:07 PM  For on call review [email or web address].

## 2023-06-06 NOTE — Assessment & Plan Note (Addendum)
Mild. - Follow up with PCP patient

## 2023-06-06 NOTE — Assessment & Plan Note (Addendum)
WBC improving, still substantial pain and swelling.  At the moment I have low suspicion for fluid collection or bone involvement. - Continue IV Rocephin

## 2023-06-06 NOTE — Assessment & Plan Note (Signed)
Blood pressure normal - Continue Lasix - Hold valsartan

## 2023-06-06 NOTE — NC FL2 (Signed)
[place] MEDICAID FL2 LEVEL OF CARE FORM     IDENTIFICATION  Patient Name: Christina Dominguez Birthdate: 1966/02/25 Sex: female Admission Date (Current Location): 06/04/2023  [place] Number:  [facility] [id number] S Facility and Address:  [facility], [address], [place], [place] [address]      Provider Number: [id number]  Attending Physician Name and Address:  Alberteen Sam, *  Relative Name and Phone Number:  Clemmie Krill (Sister)  [phone]    Current Level of Care: Hospital Recommended Level of Care: Skilled Nursing Facility Prior Approval Number:    Date Approved/Denied:   PASRR Number: screen still running  Discharge Plan: SNF    Current Diagnoses: Patient Active Problem List   Diagnosis Date Noted   Transaminitis 06/06/2023   Chest pain 06/05/2023   Cellulitis of left lower extremity 06/04/2023   Nocturnal hypoxemia 07/03/2022   Arthritis 04/16/2022   Asthma 04/16/2022   Hematoma of left lower leg    Wheezing 11/23/2021   Snoring 11/23/2021   Other secondary pulmonary hypertension (HCC) 11/23/2021   Hyponatremia 10/11/2021   Dyslipidemia 10/04/2021   Mediastinal mass 10/04/2021   Obstructive sleep apnea 08/25/2021   RUQ pain 08/19/2021   Hepatic steatosis 08/19/2021   Hepatomegaly 08/19/2021   Aortic atherosclerosis (HCC) 08/19/2021   Diverticulosis 08/19/2021   Anxiety 08/02/2021   Bipolar 1 disorder (HCC) 08/02/2021   Chronic diastolic CHF (congestive heart failure) (HCC) 08/02/2021   Depression 08/02/2021   Joint pain 08/02/2021   Screening for malignant neoplasm of colon 08/02/2021   Diastolic dysfunction 08/02/2021   Prediabetes 12/22/2019   Vitamin D insufficiency 12/22/2019   Class 3 severe obesity with serious comorbidity and body mass index (BMI) greater than or equal to 60 in adult [facility]) 12/21/2019   Essential hypertension 04/30/2019   Mild intermittent asthma without complication  04/30/2019    Orientation RESPIRATION BLADDER Height & Weight     Self, Time, Situation, Place  O2 Continent Weight: (!) 355 lb (161 kg) Height:  5' (152.4 cm)  BEHAVIORAL SYMPTOMS/MOOD NEUROLOGICAL BOWEL NUTRITION STATUS      Continent Diet (see discharge summary)  AMBULATORY STATUS COMMUNICATION OF NEEDS Skin   Limited Assist Verbally Other (Comment) (Cellulitis of left lower extremity)                       Personal Care Assistance Level of Assistance  Bathing, Feeding, Dressing Bathing Assistance: Limited assistance Feeding assistance: Independent Dressing Assistance: Limited assistance     Functional Limitations Info  Sight, Hearing, Speech Sight Info: Adequate Hearing Info: Adequate Speech Info: Adequate    SPECIAL CARE FACTORS FREQUENCY  PT (By licensed PT), OT (By licensed OT)     PT Frequency: 5x/week OT Frequency: 5x/week            Contractures Contractures Info: Not present    Additional Factors Info  Code Status, Allergies Code Status Info: FULL Allergies Info: Aspirin  Egg-derived Products  Influenza Vaccines           Current Medications (06/06/2023):  This is the current hospital active medication list Current Facility-Administered Medications  Medication Dose Route Frequency Provider Last Rate Last Admin   acetaminophen (TYLENOL) tablet 650 mg  650 mg Oral Q6H PRN John Giovanni, MD   650 mg at 06/06/23 0347   Or   acetaminophen (TYLENOL) suppository 650 mg  650 mg Rectal Q6H PRN John Giovanni, MD       cefTRIAXone (ROCEPHIN)  2 g in sodium chloride 0.9 % 100 mL IVPB  2 g Intravenous Q24H John Giovanni, MD   Stopped at 06/05/23 2134   diclofenac Sodium (VOLTAREN) 1 % topical gel 1 Application  1 Application Topical PRN Danford, Earl Lites, MD       enoxaparin (LOVENOX) injection 80 mg  80 mg Subcutaneous Q24H Danford, Earl Lites, MD   80 mg at 06/06/23 1032   furosemide (LASIX) injection 60 mg  60 mg Intravenous Once  Danford, Earl Lites, MD       furosemide (LASIX) tablet 80 mg  80 mg Oral Daily Alberteen Sam, MD   80 mg at 06/06/23 1032   Oral care mouth rinse  15 mL Mouth Rinse PRN John Giovanni, MD         Discharge Medications: Please see discharge summary for a list of discharge medications.  Relevant Imaging Results:  Relevant Lab Results:   Additional Information [id number]  Masyn Fullam A [place], LCSWA

## 2023-06-06 NOTE — Assessment & Plan Note (Signed)
-   CPAP at night °

## 2023-06-06 NOTE — Assessment & Plan Note (Addendum)
Resolved

## 2023-06-07 DIAGNOSIS — R531 Weakness: Secondary | ICD-10-CM | POA: Diagnosis not present

## 2023-06-07 DIAGNOSIS — G4733 Obstructive sleep apnea (adult) (pediatric): Secondary | ICD-10-CM | POA: Diagnosis not present

## 2023-06-07 DIAGNOSIS — L03116 Cellulitis of left lower limb: Secondary | ICD-10-CM | POA: Diagnosis not present

## 2023-06-07 DIAGNOSIS — E785 Hyperlipidemia, unspecified: Secondary | ICD-10-CM | POA: Diagnosis not present

## 2023-06-07 DIAGNOSIS — I5032 Chronic diastolic (congestive) heart failure: Secondary | ICD-10-CM | POA: Diagnosis not present

## 2023-06-07 DIAGNOSIS — R079 Chest pain, unspecified: Secondary | ICD-10-CM | POA: Diagnosis not present

## 2023-06-07 DIAGNOSIS — I509 Heart failure, unspecified: Secondary | ICD-10-CM | POA: Diagnosis not present

## 2023-06-07 DIAGNOSIS — Z6841 Body Mass Index (BMI) 40.0 and over, adult: Secondary | ICD-10-CM | POA: Diagnosis not present

## 2023-06-07 DIAGNOSIS — E66813 Obesity, class 3: Secondary | ICD-10-CM | POA: Diagnosis not present

## 2023-06-07 LAB — BASIC METABOLIC PANEL
Anion gap: 9 (ref 5–15)
BUN: 9 mg/dL (ref 6–20)
CO2: 32 mmol/L (ref 22–32)
Calcium: 8.2 mg/dL — ABNORMAL LOW (ref 8.9–10.3)
Chloride: 96 mmol/L — ABNORMAL LOW (ref 98–111)
Creatinine, Ser: 0.69 mg/dL (ref 0.44–1.00)
GFR, Estimated: >60 mL/min (ref >60)
Glucose, Bld: 108 mg/dL — ABNORMAL HIGH (ref 70–99)
Potassium: 3.4 mmol/L — ABNORMAL LOW (ref 3.5–5.1)
Sodium: 137 mmol/L (ref 135–145)

## 2023-06-07 NOTE — Progress Notes (Signed)
  Progress Note   Patient: Christina Dominguez YNW:[id number] DOB: 1966-02-08 DOA: 06/04/2023     2 DOS: the patient was seen and examined on 06/07/2023 at 9:00AM      Brief hospital course: 57 y.o. F with morbid obesity, HTN, dCHF, pHTN, chronic bronchitis, and lymphedema who presented with redness, pain and drainage of the left leg.     Assessment and Plan: * Cellulitis of left lower extremity The patient was admitted with leukocytosis, leg pain, swelling, that were severe enough that she could not ambulate or care for herself at home and required IV antibiotics in the hospital.  This is improving, and in the next 24 hours, I suspect arrangements can be made to discharge her home to complete oral antibiotics.    - Continue IV Rocephin     Mild acute on chronic diastolic congestive heart failure (congestive heart failure) (HCC) Pulmonary hypertension The patient was hypoxic yesterday, she was given extra IV Lasix, and today she is back on room air, and I suspect that she had some mild CHF and that her hypoxia was not from pulmonary hypertension along - Continue Lasix   Obstructive sleep apnea - CPAP at night  Class 3 severe obesity with serious comorbidity and body mass index (BMI) greater than or equal to 60 in adult (HCC) BMI 69.3  Essential hypertension Blood pressure normal - Continue Lasix - Hold valsartan          Subjective: Adult female, overall improving, starting to turn the corner.  No fever, no confusion.     Physical Exam: BP 136/80 (BP Location: Left Arm)   Pulse 89   Temp 97.7 F (36.5 C) (Oral)   Resp 17   Ht 5' (1.524 m)   Wt (!) 161 kg   SpO2 96%   BMI 69.33 kg/m   Obese adult female, lying in bed, interactive and appropriate RRR, no murmurs, no peripheral edema Respiratory rate normal, lungs clear without rales or wheezes Abdomen soft without tenderness palpation or guarding The left leg still has some redness and edema which I suspect  will persist for a while, but this seems slightly better than previous when it was severe.  There is no drainage from the old wound.  No fluctuance. Attention normal, affect appropriate, judgment and insight appear normal    Data Reviewed: Basic metabolic panel shows stable renal function  Family Communication:     Disposition: Status is: Inpatient The patient was admitted with cellulitis.  This was severe enough that she could not safely walk or care for herself at home and required IV antibiotics in the hospital.  In addition patient developed hypoxia while in the hospital, that was treated with diuretics and resolved.        Author: Alberteen Sam, MD 06/07/2023 5:20 PM  For on call review [email or web address].

## 2023-06-07 NOTE — Progress Notes (Signed)
Physical Therapy Treatment Patient Details Name: Christina Dominguez MRN: [id number] DOB: June 19, 1966 Today's Date: 06/07/2023   History of Present Illness Pt is a 57 y/o F admitted on 06/04/23 after presenting with redness, pain & drainage of the LLE. Pt is being treated for cellulitis. PMH: morbid obesity, HTN, dCHF, pHTN, chronic bronchitis, lymphedema, anxiety, aortic atherosclerosis, bipolar 1 disorder, depression    PT Comments  PT focus of session on activity tolerance progression. PT attempted to switch pt to [facility], which is what she uses at home, but is too unsteady and requires use of RW for gait. Pt ambulated 4x50 ft with RW and cues for form/safety, additional cuing for pursed lip breathing when dyspneic and desatting. SPO2 drop to 86% on RA, recovered to 91% with cues for pursed lip breathing technique and standing rest breaks. Pt feels she cannot d/c home alone given continued difficulty with bed mobility, lower body dressing, and activity tolerance. PT to continue to follow.      If plan is discharge home, recommend the following: A little help with walking and/or transfers;A little help with bathing/dressing/bathroom;Assistance with cooking/housework;Assist for transportation;Help with stairs or ramp for entrance   Can travel by private vehicle        Equipment Recommendations  None recommended by PT    Recommendations for Other Services       Precautions / Restrictions Precautions Precautions: Fall Precaution Comments: monitor O2 Restrictions Weight Bearing Restrictions: No     Mobility  Bed Mobility               General bed mobility comments: up in chair    Transfers Overall transfer level: Needs assistance Equipment used: Rolling walker (2 wheels) Transfers: Sit to/from Stand Sit to Stand: Supervision           General transfer comment: for safety, slow to rise    Ambulation/Gait Ambulation/Gait assistance: Supervision Gait Distance (Feet): 50  Feet (x4 - standing rest breaks to recover dyspnae) Assistive device: Rolling walker (2 wheels) Gait Pattern/deviations: Decreased step length - right, Decreased step length - left, Decreased stride length Gait velocity: decr     General Gait Details: cues for upright posture and RW proximity. SPO2 drop to 86% on RA, recovered to 91% with cues for pursed lip breathing technique and standing rest breaks.   Stairs             Wheelchair Mobility     Tilt Bed    Modified Rankin (Stroke Patients Only)       Balance Overall balance assessment: Needs assistance Sitting-balance support: Feet supported, No upper extremity supported Sitting balance-Leahy Scale: Good     Standing balance support: During functional activity, Bilateral upper extremity supported Standing balance-Leahy Scale: Fair                              Cognition Arousal: Alert Behavior During Therapy: WFL for tasks assessed/performed Overall Cognitive Status: Within Functional Limits for tasks assessed                                          Exercises      General Comments        Pertinent Vitals/Pain Pain Assessment Pain Assessment: Faces Faces Pain Scale: No hurt Pain Intervention(s): Monitored during session    Home Living  Prior Function            PT Goals (current goals can now be found in the care plan section) Acute Rehab PT Goals Patient Stated Goal: get better, rehab PT Goal Formulation: With patient Time For Goal Achievement: 06/14/23 Potential to Achieve Goals: Good Progress towards PT goals: Progressing toward goals    Frequency    Min 1X/week      PT Plan      Co-evaluation              AM-PAC PT "6 Clicks" Mobility   Outcome Measure  Help needed turning from your back to your side while in a flat bed without using bedrails?: A Little Help needed moving from lying on your back to sitting  on the side of a flat bed without using bedrails?: A Little Help needed moving to and from a bed to a chair (including a wheelchair)?: A Little Help needed standing up from a chair using your arms (e.g., wheelchair or bedside chair)?: None Help needed to walk in hospital room?: A Little Help needed climbing 3-5 steps with a railing? : A Little 6 Click Score: 19    End of Session   Activity Tolerance: Patient tolerated treatment well Patient left: in chair;with call bell/phone within reach   PT Visit Diagnosis: Muscle weakness (generalized) (M62.81);Other abnormalities of gait and mobility (R26.89);Difficulty in walking, not elsewhere classified (R26.2)     Time: 0454-0981 PT Time Calculation (min) (ACUTE ONLY): 19 min  Charges:    $Therapeutic Activity: 8-22 mins PT General Charges $$ ACUTE PT VISIT: 1 Visit                     Marye Round, PT DPT Acute Rehabilitation Services Secure Chat Preferred  Office [phone]    Takenya Travaglini Sheliah Plane 06/07/2023, 5:19 PM

## 2023-06-07 NOTE — Evaluation (Signed)
Occupational Therapy Evaluation Patient Details Name: Christina Dominguez MRN: [id number] DOB: 1965/09/04 Today's Date: 06/07/2023   History of Present Illness Pt is a 57 y/o F admitted on 06/04/23 after presenting with redness, pain & drainage of the LLE. Pt is being treated for cellulitis. PMH: morbid obesity, HTN, dCHF, pHTN, chronic bronchitis, lymphedema, anxiety, aortic atherosclerosis, bipolar 1 disorder, depression   Clinical Impression   This 57 yo female admitted with above presents to acute OT with PLOF of being Independent to Mod I with all basic ADLs. Currently she is setup/S with all ADLs and ambulation in room with RW. She needs to be Independent to Mod I with ADLs and IADLs to be able to return home alone. We will continue to follow.       If plan is discharge home, recommend the following: A little help with walking and/or transfers;Assistance with cooking/housework;Help with stairs or ramp for entrance;Assist for transportation    Functional Status Assessment  Patient has had a recent decline in their functional status and demonstrates the ability to make significant improvements in function in a reasonable and predictable amount of time.  Equipment Recommendations   (TBD next venue)       Precautions / Restrictions Precautions Precautions: Fall Precaution Comments: monitor O2 Restrictions Weight Bearing Restrictions: No      Mobility Bed Mobility Overal bed mobility: Modified Independent             General bed mobility comments: increased time OOB with HOB up and use of upper rail    Transfers Overall transfer level: Modified independent Equipment used: Rolling walker (2 wheels)                      Balance Overall balance assessment: Needs assistance Sitting-balance support: Feet supported, No upper extremity supported Sitting balance-Leahy Scale: Good     Standing balance support: During functional activity, Bilateral upper extremity  supported Standing balance-Leahy Scale: Fair Standing balance comment: standing at sink to wash hands--propping on sink                           ADL either performed or assessed with clinical judgement   ADL Overall ADL's : Needs assistance/impaired Eating/Feeding: Independent;Sitting   Grooming: Supervision/safety;Standing;Wash/dry hands   Upper Body Bathing: Set up;Sitting   Lower Body Bathing: Set up;With adaptive equipment;Sit to/from stand   Upper Body Dressing : Set up;Sitting   Lower Body Dressing: Set up;With adaptive equipment;Sit to/from stand   Toilet Transfer: Supervision/safety;Ambulation;Comfort height toilet;Grab bars   Toileting- Clothing Manipulation and Hygiene: Supervision/safety;Sit to/from stand               Vision Baseline Vision/History:  (wears one contact in dominant eye)              Pertinent Vitals/Pain Pain Assessment Pain Assessment: No/denies pain     Extremity/Trunk Assessment Upper Extremity Assessment Upper Extremity Assessment: Overall WFL for tasks assessed;Left hand dominant           Communication Communication Communication: No apparent difficulties   Cognition Arousal: Alert Behavior During Therapy: WFL for tasks assessed/performed, Impulsive Overall Cognitive Status: Within Functional Limits for tasks assessed                                 General Comments: slightly decreased awareness of safety/location of lines when turning.  Home Living Family/patient expects to be discharged to:: Private residence Living Arrangements: Alone   Type of Home: House Home Access: Stairs to enter [employer] of Steps: 2   Home Layout: One level     Bathroom Shower/Tub: Tub/shower unit;Curtain   Firefighter: Standard     Home Equipment: Agricultural consultant (2 wheels);Cane - single point;BSC/3in1   Additional Comments: Pt's house just sold and she has 60 days to  vacate--looking for a one level ground apartment      Prior Functioning/Environment               Mobility Comments: Denies falls in the last 6 months but notes falls prior to that. Pt reports she's mod I with SPC in the home, uses upright walker outside of the home but hardly leaves her house 2/2 difficulty negotiating stairs. ADLs Comments: Cooks, cleans, bathes, & dresses herself. 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        OT Problem List: Impaired balance (sitting and/or standing);Obesity;Cardiopulmonary status limiting activity      OT Treatment/Interventions: Self-care/ADL training;Patient/family education;DME and/or AE instruction    OT Goals(Current goals can be found in the care plan section) Acute Rehab OT Goals Patient Stated Goal: to go to rehab before home Time For Goal Achievement: 06/21/23 Potential to Achieve Goals: Good  OT Frequency: Min 1X/week       AM-PAC OT "6 Clicks" Daily Activity     Outcome Measure Help from another person eating meals?: None Help from another person taking care of personal grooming?: A Little Help from another person toileting, which includes using toliet, bedpan, or urinal?: A Little Help from another person bathing (including washing, rinsing, drying)?: A Little Help from another person to put on and taking off regular upper body clothing?: A Little Help from another person to put on and taking off regular lower body clothing?: A Little 6 Click Score: 19   End of Session Equipment Utilized During Treatment: Rolling walker (2 wheels)  Activity Tolerance: Patient tolerated treatment well Patient left: with call bell/phone within reach  OT Visit Diagnosis: Unsteadiness on feet (R26.81);Other abnormalities of gait and mobility (R26.89)                Time: 4132-4401 OT Time Calculation (min): 28 min Charges:  OT General Charges $OT Visit: 1 Visit OT Evaluation $OT Eval Moderate Complexity: 1 Mod OT  Treatments $Self Care/Home Management : 8-22 mins  Lindon Romp OT Acute Rehabilitation Services Office [phone]    Evette Georges 06/07/2023, 9:49 AM

## 2023-06-07 NOTE — TOC Progression Note (Signed)
Transition of Care [facility]) - Progression Note    Patient Details  Name: Christina Dominguez MRN: [id number] Date of Birth: Nov 14, 1965  Transition of Care [facility]) CM/SW Contact  [place] A [place], [place] Phone Number: 06/07/2023, 4:54 PM  Clinical Narrative:     CSW met with pt at bedside. She said she was ok with going home instead of SNF. She said she would reach out to friend/family who can transport and assist at the house with meals or other personal care. Possible DC tomorrow.   TOC will continue to follow.   Expected Discharge Plan: Skilled Nursing Facility Barriers to Discharge: SNF Pending bed offer, Continued Medical Work up, English as a second language teacher  Expected Discharge Plan and Services       Living arrangements for the past 2 months: Single Family Home                                       Social Determinants of Health (SDOH) Interventions SDOH Screenings   Food Insecurity: No Food Insecurity (06/04/2023)  Housing: Patient Declined (06/04/2023)  Transportation Needs: No Transportation Needs (06/04/2023)  Utilities: Not At Risk (06/04/2023)  Depression (PHQ2-9): High Risk (08/30/2021)  Financial Resource Strain: Medium Risk (10/05/2021)  Tobacco Use: Low Risk  (06/04/2023)    Readmission Risk Interventions     No data to display

## 2023-06-08 ENCOUNTER — Other Ambulatory Visit (HOSPITAL_COMMUNITY): Payer: Self-pay

## 2023-06-08 DIAGNOSIS — I5032 Chronic diastolic (congestive) heart failure: Secondary | ICD-10-CM | POA: Diagnosis not present

## 2023-06-08 DIAGNOSIS — L03116 Cellulitis of left lower limb: Secondary | ICD-10-CM | POA: Diagnosis not present

## 2023-06-08 DIAGNOSIS — Z6841 Body Mass Index (BMI) 40.0 and over, adult: Secondary | ICD-10-CM | POA: Diagnosis not present

## 2023-06-08 DIAGNOSIS — E66813 Obesity, class 3: Secondary | ICD-10-CM | POA: Diagnosis not present

## 2023-06-08 DIAGNOSIS — E785 Hyperlipidemia, unspecified: Secondary | ICD-10-CM | POA: Diagnosis not present

## 2023-06-08 DIAGNOSIS — R079 Chest pain, unspecified: Secondary | ICD-10-CM | POA: Diagnosis not present

## 2023-06-08 MED ORDER — CEFADROXIL 500 MG PO CAPS
1000.0000 mg | ORAL_CAPSULE | Freq: Two times a day (BID) | ORAL | 0 refills | Status: AC
Start: 2023-06-08 — End: 2023-06-16
  Filled 2023-06-08: qty 32, 8d supply, fill #0

## 2023-06-08 NOTE — Discharge Summary (Signed)
Physician Discharge Summary   Patient: Christina Dominguez MRN: [id number] DOB: 1965/10/25  Admit date:     06/04/2023  Discharge date: 06/08/23  Discharge Physician: Alberteen Sam   PCP: Bary Leriche, PA-C     Recommendations at discharge:  Follow-up with PCP Alyssa Allwardt in 1 week for cellulitis of the left lower extremity Alyssa Allwardt: Please repeat LFTs in 4 weeks and evaluate as appropriate if still abnormal     Discharge Diagnoses: Principal Problem:   Cellulitis of left lower extremity Active Problems:   Chronic diastolic CHF (congestive heart failure) (HCC)   Chest pain   Essential hypertension   Class 3 severe obesity with serious comorbidity and body mass index (BMI) greater than or equal to 60 in adult [facility])   Obstructive sleep apnea   Dyslipidemia   Hyponatremia   Other secondary pulmonary hypertension (HCC)   Transaminitis      Hospital Course: 57 y.o. F with morbid obesity, HTN, dCHF, pHTN, chronic bronchitis, and lymphedema who presented with redness, pain and drainage of the left leg.      * Cellulitis of left lower extremity The patient was admitted and treated with IV antibiotics due to severe pain, inability to walk.  Her leukocytosis resolved, and her functional status return to the point she could return home and take oral antibiotics safely.  Discharged with 10 days total cefadroxil, PCP follow-up recommended.  The infection occurred at the site of her old hematoma, but there was no fluctuance at the site, and overall I have low suspicion for osteomyelitis.    Chest pain Troponin negative.  Low suspicion for ischemia.  D-dimer minimally elevated, doubt PE given resolution of symptoms with antibiotics.  Chronic diastolic CHF (congestive heart failure) (HCC) Other secondary pulmonary hypertension (HCC) On Lasix.  Transaminitis Mild.  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  No GI symptoms, no right upper quadrant  symptoms. - Follow up with PCP            [facility] Controlled Substances Registry was reviewed for this patient prior to discharge.  Consultants: None   Disposition: Home health Diet recommendation:  Discharge Diet Orders (From admission, onward)     Start     Ordered   06/08/23 0000  Diet - low sodium heart healthy        06/08/23 0933             DISCHARGE MEDICATION: Allergies as of 06/08/2023       Reactions   Aspirin Nausea And Vomiting   Egg-derived Products Swelling   Influenza Vaccines Swelling        Medication List     TAKE these medications    acetaminophen 650 MG CR tablet Commonly known as: TYLENOL Take 1,300 mg by mouth as needed for pain.   albuterol 108 (90 Base) MCG/ACT inhaler Commonly known as: VENTOLIN HFA Inhale 2 puffs into the lungs every 6 (six) hours as needed for wheezing or shortness of breath. What changed:  how much to take when to take this   cefadroxil 500 MG capsule Commonly known as: DURICEF Take 2 capsules (1,000 mg total) by mouth 2 (two) times daily for 8 days.   Comirnaty syringe Generic drug: COVID-19 mRNA vaccine (Pfizer) Inject into the muscle.   diclofenac Sodium 1 % Gel Commonly known as: VOLTAREN Apply 1 Application topically as needed (pain).   dicyclomine 10 MG capsule Commonly known as: BENTYL Take 1 capsule (10 mg total) by mouth 4 (four)  times daily -  before meals and at bedtime. What changed:  how much to take when to take this reasons to take this   diphenhydrAMINE 25 MG tablet Commonly known as: BENADRYL Take 50 mg by mouth at bedtime as needed for sleep.   furosemide 80 MG tablet Commonly known as: LASIX TAKE 1 TABLET(80 MG) BY MOUTH DAILY   potassium chloride SA 20 MEQ tablet Commonly known as: KLOR-CON M Take 1 tablet (20 mEq total) by mouth daily.   TURMERIC PO Take 1 tablet by mouth as needed (pain).   valsartan 40 MG tablet Commonly known as: Diovan Take 1  tablet (40 mg total) by mouth daily.   ZZZQUIL PO Take 3 tablets by mouth at bedtime.        Follow-up Information     Allwardt, Alyssa M, PA-C. Schedule an appointment as soon as possible for a visit in 1 week(s).   Specialty: Physician Assistant Contact information: [address] [place] [address] [phone]                 Discharge Instructions     Diet - low sodium heart healthy   Complete by: As directed    Discharge instructions   Complete by: As directed    **IMPORTANT DISCHARGE INSTRUCTIONS**   From Dr. Maryfrances Bunnell: You were admitted for cellulitis  You were treated with antibiotics  You should continue antibiotics for 8 more days with cefadroxil Take cefadroxil 1000 mg twice daily for 8 more days starting tonight  Go see Alyssa Alwardt your Primary care provider in 1 week, when you are done with antibiotics  Elevate the leg  Call your primary care office if you have fever, flu-like symptoms, more draining from the leg or feel too weak to get up   Increase activity slowly   Complete by: As directed        Discharge Exam: Filed Weights   06/04/23 2240 06/06/23 0425 06/08/23 0427  Weight: (!) 160.5 kg (!) 161 kg (!) 160 kg    General: Pt is alert, awake, not in acute distress Cardiovascular: RRR, nl S1-S2, no murmurs appreciated.   No LE edema.   Respiratory: Normal respiratory rate and rhythm.  CTAB without rales or wheezes. Abdominal: Abdomen soft and non-tender.  No distension or HSM.   MSK: Bilateral lower extremities have chronic lymphedema, the left leg below the knee has some persistent redness, and tenderness, but is overall improving, has no drainage, no fluctuance. Neuro/Psych: Strength symmetric in upper and lower extremities.  Judgment and insight appear normal.   Condition at discharge: fair  The results of significant diagnostics from this hospitalization (including imaging, microbiology, ancillary and laboratory) are  listed below for reference.   Imaging Studies: US Venous Img Lower  Left (DVT Study)  Result Date: 06/04/2023 CLINICAL DATA:  Left leg swelling and shortness of breath. EXAM: LEFT LOWER EXTREMITY VENOUS DOPPLER ULTRASOUND TECHNIQUE: Gray-scale sonography with graded compression, as well as color Doppler and duplex ultrasound were performed to evaluate the lower extremity deep venous systems from the level of the common femoral vein and including the common femoral, femoral, profunda femoral, popliteal and calf veins including the posterior tibial, peroneal and gastrocnemius veins when visible. The superficial great saphenous vein was also interrogated. Spectral Doppler was utilized to evaluate flow at rest and with distal augmentation maneuvers in the common femoral, femoral and popliteal veins. COMPARISON:  January 31, 2022 FINDINGS: Contralateral Common Femoral Vein: Respiratory phasicity is normal and  symmetric with the symptomatic side. No evidence of thrombus. Normal compressibility. Common Femoral Vein: No evidence of thrombus. Normal compressibility, respiratory phasicity and response to augmentation. Saphenofemoral Junction: No evidence of thrombus. Normal compressibility and flow on color Doppler imaging. Profunda Femoral Vein: No evidence of thrombus. Normal compressibility and flow on color Doppler imaging. Femoral Vein: No evidence of thrombus. Normal compressibility, respiratory phasicity and response to augmentation. Popliteal Vein: No evidence of thrombus. Normal compressibility, respiratory phasicity and response to augmentation. Calf Veins: The LEFT posterior tibial vein and LEFT peroneal vein are not visualized. Superficial Great Saphenous Vein: No evidence of thrombus. Normal compressibility. Venous Reflux:  None. Other Findings: Limited study secondary to the patient's body habitus. IMPRESSION: Limited evaluation of the LEFT posterior tibial vein and LEFT peroneal vein, without evidence of DVT  within the LEFT lower extremity. Electronically Signed   By: Aram Candela M.D.   On: 06/04/2023 20:56   DG Chest Portable 1 View  Result Date: 06/04/2023 CLINICAL DATA:  SOB SHOB and CP since last night; also concerned about LLE wound that was healed, but opened up again last night; LLE with redness and swelling. EXAM: PORTABLE CHEST 1 VIEW COMPARISON:  Chest x-ray 01/31/2022 FINDINGS: The heart and mediastinal contours are unchanged. No focal consolidation. No pulmonary edema. No pleural effusion. No pneumothorax. No acute osseous abnormality. IMPRESSION: No active disease. Electronically Signed   By: Tish Frederickson M.D.   On: 06/04/2023 20:51    Microbiology: Results for orders placed or performed during the hospital encounter of 06/04/23  Culture, blood (routine x 2)     Status: None (Preliminary result)   Collection Time: 06/04/23  4:50 PM   Specimen: BLOOD  Result Value Ref Range Status   Specimen Description   Final    BLOOD RIGHT ANTECUBITAL Performed at [facility], [address], [place], [place] [address]    Special Requests   Final    BOTTLES DRAWN AEROBIC AND ANAEROBIC Blood Culture results may not be optimal due to an excessive volume of blood received in culture bottles Performed at [facility], [address] [place], [place] [address]    Culture   Final    NO GROWTH 4 DAYS Performed at [facility], [address] [place], [place] [address]    Report Status PENDING  Incomplete  Resp panel by RT-PCR (RSV, Flu A&B, Covid) Anterior Nasal Swab     Status: None   Collection Time: 06/04/23  5:00 PM   Specimen: Anterior Nasal Swab  Result Value Ref Range Status   SARS Coronavirus 2 by RT PCR NEGATIVE NEGATIVE Final    Comment: (NOTE) SARS-CoV-2 target nucleic acids are NOT DETECTED.  The SARS-CoV-2 RNA is generally detectable in upper respiratory specimens during the acute phase of infection. The lowest concentration of SARS-CoV-2 viral  copies this assay can detect is 138 copies/mL. A negative result does not preclude SARS-Cov-2 infection and should not be used as the sole basis for treatment or other patient management decisions. A negative result may occur with  improper specimen collection/handling, submission of specimen other than nasopharyngeal swab, presence of viral mutation(s) within the areas targeted by this assay, and inadequate number of viral copies(<138 copies/mL). A negative result must be combined with clinical observations, patient history, and epidemiological information. The expected result is Negative.  Fact Sheet for Patients:  BloggerCourse.com  Fact Sheet for Healthcare Providers:  SeriousBroker.it  This test is no t yet approved  or cleared by the [place] and  has been authorized for detection and/or diagnosis of SARS-CoV-2 by FDA under an Emergency Use Authorization (EUA). This EUA will remain  in effect (meaning this test can be used) for the duration of the COVID-19 declaration under Section 564(b)(1) of the Act, 21 U.S.C.section 360bbb-3(b)(1), unless the authorization is terminated  or revoked sooner.       Influenza A by PCR NEGATIVE NEGATIVE Final   Influenza B by PCR NEGATIVE NEGATIVE Final    Comment: (NOTE) The Xpert Xpress SARS-CoV-2/FLU/RSV plus assay is intended as an aid in the diagnosis of influenza from Nasopharyngeal swab specimens and should not be used as a sole basis for treatment. Nasal washings and aspirates are unacceptable for Xpert Xpress SARS-CoV-2/FLU/RSV testing.  Fact Sheet for Patients: BloggerCourse.com  Fact Sheet for Healthcare Providers: SeriousBroker.it  This test is not yet approved or cleared by the [place] FDA and has been authorized for detection and/or diagnosis of SARS-CoV-2 by FDA under an Emergency Use Authorization (EUA). This  EUA will remain in effect (meaning this test can be used) for the duration of the COVID-19 declaration under Section 564(b)(1) of the Act, 21 U.S.C. section 360bbb-3(b)(1), unless the authorization is terminated or revoked.     Resp Syncytial Virus by PCR NEGATIVE NEGATIVE Final    Comment: (NOTE) Fact Sheet for Patients: BloggerCourse.com  Fact Sheet for Healthcare Providers: SeriousBroker.it  This test is not yet approved or cleared by the [place] FDA and has been authorized for detection and/or diagnosis of SARS-CoV-2 by FDA under an Emergency Use Authorization (EUA). This EUA will remain in effect (meaning this test can be used) for the duration of the COVID-19 declaration under Section 564(b)(1) of the Act, 21 U.S.C. section 360bbb-3(b)(1), unless the authorization is terminated or revoked.  Performed at [facility], [address] [place], [place] [address]   Culture, blood (routine x 2)     Status: None (Preliminary result)   Collection Time: 06/04/23  5:00 PM   Specimen: BLOOD  Result Value Ref Range Status   Specimen Description   Final    BLOOD LEFT ANTECUBITAL Performed at [facility], [address] [place], [place] [address]    Special Requests   Final    BOTTLES DRAWN AEROBIC AND ANAEROBIC Blood Culture results may not be optimal due to an excessive volume of blood received in culture bottles Performed at [facility], [address] [place], [place] [address]    Culture   Final    NO GROWTH 4 DAYS Performed at [facility], [address] [place], [place] [address]    Report Status PENDING  Incomplete    Labs: CBC: Recent Labs  Lab 06/04/23 1654 06/04/23 1703 06/05/23 0437 06/06/23 0757  WBC 16.6*  --  11.1* 7.8  NEUTROABS 14.6*  --   --   --   HGB 12.7 13.9 11.7* 11.3*  HCT 39.2 41.0 37.5 36.4  MCV 90.3  --  91.0 93.1  PLT 311  --  244 240   Basic  Metabolic Panel: Recent Labs  Lab 06/04/23 1654 06/04/23 1703 06/05/23 0437 06/06/23 0757 06/07/23 0854  NA 134* 136 135 137 137  K 3.7 3.7 5.1 4.0 3.4*  CL 93*  --  97* 99 96*  CO2 29  --  27 29 32  GLUCOSE 105*  --  110* 105* 108*  BUN 21*  --  16 10 9   CREATININE 0.85  --  0.72 0.75 0.69  CALCIUM 8.9  --  8.3* 7.8* 8.2*   Liver Function Tests: Recent Labs  Lab 06/04/23 1654 06/06/23 0757  AST 34 46*  ALT 29 48*  ALKPHOS 115 102  BILITOT 0.8 0.5  PROT 7.6 6.4*  ALBUMIN 3.5 2.7*   CBG: No results for input(s): "GLUCAP" in the last 168 hours.  Discharge time spent: approximately 35 minutes spent on discharge counseling, evaluation of patient on day of discharge, and coordination of discharge planning with nursing, social work, pharmacy and case management  Signed: Alberteen Sam, MD [facility] 06/08/2023

## 2023-06-08 NOTE — Progress Notes (Signed)
Occupational Therapy Treatment Patient Details Name: Christina Dominguez MRN: [id number] DOB: 1965-08-24 Today's Date: 06/08/2023   History of present illness Pt is a 57 y/o F admitted on 06/04/23 after presenting with redness, pain & drainage of the LLE. Pt is being treated for cellulitis. PMH: morbid obesity, HTN, dCHF, pHTN, chronic bronchitis, lymphedema, anxiety, aortic atherosclerosis, bipolar 1 disorder, depression   OT comments  Pt. Seen for skilled OT treatment session.  Provided a/e including reacher, sock aide, shoe horn, and LH sponge.  Provided demo of use. Pt. Able to return demo with good tech.  Cont. With acute OT POC while pt. Here.        If plan is discharge home, recommend the following:  A little help with walking and/or transfers;Assistance with cooking/housework;Help with stairs or ramp for entrance;Assist for transportation   Equipment Recommendations       Recommendations for Other Services      Precautions / Restrictions Precautions Precautions: Fall Precaution Comments: monitor O2       Mobility Bed Mobility                    Transfers                         Balance                                           ADL either performed or assessed with clinical judgement   ADL Overall ADL's : Needs assistance/impaired             Lower Body Bathing: Set up;With adaptive equipment;Sitting/lateral leans;Cueing for sequencing Lower Body Bathing Details (indicate cue type and reason): provided lh sponge     Lower Body Dressing: Set up;With adaptive equipment;Sit to/from stand;Sitting/lateral leans;Cueing for sequencing Lower Body Dressing Details (indicate cue type and reason): provided reacher, sock aide, and shoe horn               General ADL Comments: demo and return demo for use of a/e, pt. with good tech. and understanding of use. provided all items for her to use at home    Extremity/Trunk  Assessment              Vision       Perception     Praxis      Cognition Arousal: Alert Behavior During Therapy: [facility] for tasks assessed/performed Overall Cognitive Status: Within Functional Limits for tasks assessed                                          Exercises      Shoulder Instructions       General Comments  Works from home, reports having good system in place for meals     Pertinent Vitals/ Pain       Pain Assessment Pain Assessment: No/denies pain  Home Living                                          Prior Functioning/Environment              Frequency  Min 1X/week  Progress Toward Goals  OT Goals(current goals can now be found in the care plan section)  Progress towards OT goals: Progressing toward goals     Plan      Co-evaluation                 AM-PAC OT "6 Clicks" Daily Activity     Outcome Measure   Help from another person eating meals?: None Help from another person taking care of personal grooming?: A Little Help from another person toileting, which includes using toliet, bedpan, or urinal?: A Little Help from another person bathing (including washing, rinsing, drying)?: A Little Help from another person to put on and taking off regular upper body clothing?: A Little Help from another person to put on and taking off regular lower body clothing?: A Little 6 Click Score: 19    End of Session Equipment Utilized During Treatment: Other (comment) (A/E)  OT Visit Diagnosis: Unsteadiness on feet (R26.81);Other abnormalities of gait and mobility (R26.89)   Activity Tolerance Patient tolerated treatment well   Patient Left in chair;with call bell/phone within reach   Nurse Communication          Time: 4540-9811 OT Time Calculation (min): 22 min  Charges: OT General Charges $OT Visit: 1 Visit OT Treatments $Self Care/Home Management : 8-22 mins  Boneta Lucks, COTA/L Acute  Rehabilitation [phone]   Alessandra Bevels Lorraine-COTA/L 06/08/2023, 1:02 PM

## 2023-06-08 NOTE — TOC Transition Note (Signed)
Transition of Care [facility]) - CM/SW Discharge Note   Patient Details  Name: Christina Dominguez MRN: [id number] Date of Birth: 1966/01/16  Transition of Care [facility]) CM/SW Contact:  Lawerance Sabal, RN Phone Number: 06/08/2023, 11:38 AM   Clinical Narrative:     Sherron Monday w patient at bedside. Discussed DC plan, unable to find accepting [facility] provider, patient agreeable to OP PT OT and would like referral set up through [facility]. Referral has been submitted through Epic. Patient has RW at home, no other DME needs identified   Final next level of care: Home/Self Care Barriers to Discharge: No Barriers Identified   Patient Goals and CMS Choice CMS Medicare.gov Compare Post Acute Care list provided to:: Patient Choice offered to / list presented to : Patient  Discharge Placement                         Discharge Plan and Services Additional resources added to the After Visit Summary for     Discharge Planning Services: CM Consult            DME Arranged: N/A                    Social Determinants of Health (SDOH) Interventions SDOH Screenings   Food Insecurity: No Food Insecurity (06/04/2023)  Housing: Patient Declined (06/04/2023)  Transportation Needs: No Transportation Needs (06/04/2023)  Utilities: Not At Risk (06/04/2023)  Depression (PHQ2-9): High Risk (08/30/2021)  Financial Resource Strain: Medium Risk (10/05/2021)  Tobacco Use: Low Risk  (06/04/2023)     Readmission Risk Interventions     No data to display

## 2023-06-08 NOTE — Plan of Care (Signed)

## 2023-06-09 LAB — CULTURE, BLOOD (ROUTINE X 2)
Culture: NO GROWTH
Culture: NO GROWTH

## 2023-06-10 ENCOUNTER — Telehealth: Payer: Self-pay

## 2023-06-10 NOTE — Transitions of Care (Post Inpatient/ED Visit) (Signed)
   06/10/2023  Name: Christina Dominguez MRN: [id number] DOB: 1966/05/07  Today's TOC FU Call Status: Today's TOC FU Call Status:: Unsuccessful Call (1st Attempt) Unsuccessful Call (1st Attempt) Date: 06/10/23  Attempted to reach the patient regarding the most recent Inpatient/ED visit.  Follow Up Plan: Additional outreach attempts will be made to reach the patient to complete the Transitions of Care (Post Inpatient/ED visit) call.      Antionette Fairy, RN,BSN,CCM RN Care Manager Transitions of Care  [place]-[facility]/Population Health  Direct Phone: [phone] Toll Free: [phone] Fax: [phone]

## 2023-06-11 ENCOUNTER — Telehealth: Payer: Self-pay

## 2023-06-11 NOTE — Transitions of Care (Post Inpatient/ED Visit) (Signed)
06/11/2023  Name: Christina Dominguez MRN: [id number] DOB: January 31, 1966  Today's TOC FU Call Status: Today's TOC FU Call Status:: Successful TOC FU Call Completed TOC FU Call Complete Date: 06/11/23 Patient's Name and Date of Birth confirmed.  Transition Care Management Follow-up Telephone Call Date of Discharge: 06/08/23 Discharge Facility: [facility]) Type of Discharge: Inpatient Admission Primary Inpatient Discharge Diagnosis:: acute resp failure, cellulitis of left lower extremity" How have you been since you were released from the hospital?: Better (Pt voices she is doing better-denies any SOB, states leg is looking better-decreased swelling and discoloration-taking abxs, appetite good, no issues with elimination) Any questions or concerns?: No  Items Reviewed: Did you receive and understand the discharge instructions provided?: Yes Medications obtained,verified, and reconciled?: Yes (Medications Reviewed) Any new allergies since your discharge?: No Dietary orders reviewed?: Yes Type of Diet Ordered:: low salt/heart healthy Do you have support at home?: Yes People in Home: alone  Medications Reviewed Today: Medications Reviewed Today     Reviewed by Charlyn Minerva, RN (Registered Nurse) on 06/11/23 at [phone]  Med List Status: <None>   Medication Order Taking? Sig Documenting Provider Last Dose Status Informant  acetaminophen (TYLENOL) 650 MG CR tablet [id number] Yes Take 1,300 mg by mouth as needed for pain. [provider] Taking Active Self  albuterol (VENTOLIN HFA) 108 (90 Base) MCG/ACT inhaler [id number] Yes Inhale 2 puffs into the lungs every 6 (six) hours as needed for wheezing or shortness of breath.  Patient taking differently: Inhale 2-3 puffs into the lungs as needed for wheezing or shortness of breath.   Luciano Cutter, MD Taking Active Self, Pharmacy Records  cefadroxil (DURICEF) 500 MG capsule [id number] Yes Take 2 capsules (1,000 mg total)  by mouth 2 (two) times daily for 8 days. Alberteen Sam, MD Taking Active   COVID-19 mRNA vaccine [phone] [facility]) syringe [id number]  Inject into the muscle.   Active Self  diclofenac Sodium (VOLTAREN) 1 % GEL [id number] Yes Apply 1 Application topically as needed (pain). [provider] Taking Active Self  dicyclomine (BENTYL) 10 MG capsule [id number]  Take 1 capsule (10 mg total) by mouth 4 (four) times daily -  before meals and at bedtime.  Patient taking differently: Take 10-20 mg by mouth as needed for spasms.   Allwardt, Alyssa M, PA-C  Expired 06/05/23 2359 Self, Pharmacy Records  diphenhydrAMINE (BENADRYL) 25 MG tablet [id number] Yes Take 50 mg by mouth at bedtime as needed for sleep. [provider] Taking Active Self  diphenhydrAMINE HCl, Sleep, (ZZZQUIL PO) [id number] Yes Take 3 tablets by mouth at bedtime. [provider] Taking Active Self  furosemide (LASIX) 80 MG tablet [id number] Yes TAKE 1 TABLET(80 MG) BY MOUTH DAILY Georgeanna Lea, MD Taking Active Self, Pharmacy Records  potassium chloride SA (KLOR-CON M) 20 MEQ tablet [id number] No Take 1 tablet (20 mEq total) by mouth daily.  Patient not taking: Reported on 06/05/2023   Georgeanna Lea, MD Not Taking Active Self  TURMERIC PO [id number] Yes Take 1 tablet by mouth as needed (pain). [provider] Taking Active Self  valsartan (DIOVAN) 40 MG tablet [id number] Yes Take 1 tablet (40 mg total) by mouth daily. Georgeanna Lea, MD Taking Active Self, Pharmacy Records            Home Care and Equipment/Supplies: Were Home Health Services Ordered?: NA (Pt was set up with outpt therapy-she confirms they called yest and she has appt on 06/26/23) Any new equipment  or medical supplies ordered?: NA  Functional Questionnaire: Do you need assistance with bathing/showering or dressing?: No Do you need assistance with meal preparation?: No Do you need assistance with eating?:  No Do you have difficulty maintaining continence: No Do you need assistance with getting out of bed/getting out of a chair/moving?: No Do you have difficulty managing or taking your medications?: No  Follow up appointments reviewed: PCP Follow-up appointment confirmed?: Yes Date of PCP follow-up appointment?: 06/28/23 (care guide assisted with making appt during call) Follow-up Provider: [facility] Follow-up appointment confirmed?: NA Do you need transportation to your follow-up appointment?: No (pt confirms she is able to get to appts) Do you understand care options if your condition(s) worsen?: Yes-patient verbalized understanding  SDOH Interventions Today    Flowsheet Row Most Recent Value  SDOH Interventions   Food Insecurity Interventions Intervention Not Indicated  Transportation Interventions Intervention Not Indicated       Antionette Fairy, RN,BSN,CCM RN Care Manager Transitions of Care  [place]-[facility]/Population Health  Direct Phone: [phone] Toll Free: [phone] Fax: [phone]

## 2023-06-13 ENCOUNTER — Telehealth: Payer: Self-pay | Admitting: Physician Assistant

## 2023-06-13 NOTE — Telephone Encounter (Signed)
Christina Dominguez with [facility] requests RX for CPAP machine with pressure settings be faxed to [facility] at Fax# [phone]

## 2023-06-17 NOTE — Telephone Encounter (Signed)
Please see message and advise 

## 2023-06-18 ENCOUNTER — Emergency Department (HOSPITAL_BASED_OUTPATIENT_CLINIC_OR_DEPARTMENT_OTHER): Payer: Medicaid Other

## 2023-06-18 ENCOUNTER — Observation Stay (HOSPITAL_BASED_OUTPATIENT_CLINIC_OR_DEPARTMENT_OTHER)
Admission: EM | Admit: 2023-06-18 | Discharge: 2023-06-21 | Disposition: A | Discharge status: Home / Self Care | Payer: Medicaid Other | Attending: Internal Medicine | Admitting: Internal Medicine

## 2023-06-18 ENCOUNTER — Telehealth: Payer: Self-pay | Admitting: Physician Assistant

## 2023-06-18 ENCOUNTER — Encounter (HOSPITAL_BASED_OUTPATIENT_CLINIC_OR_DEPARTMENT_OTHER): Payer: Self-pay | Admitting: Emergency Medicine

## 2023-06-18 DIAGNOSIS — Z6841 Body Mass Index (BMI) 40.0 and over, adult: Secondary | ICD-10-CM

## 2023-06-18 DIAGNOSIS — Z7951 Long term (current) use of inhaled steroids: Secondary | ICD-10-CM | POA: Diagnosis not present

## 2023-06-18 DIAGNOSIS — I700 Atherosclerosis of aorta: Secondary | ICD-10-CM | POA: Diagnosis present

## 2023-06-18 DIAGNOSIS — G4733 Obstructive sleep apnea (adult) (pediatric): Secondary | ICD-10-CM | POA: Diagnosis present

## 2023-06-18 DIAGNOSIS — R222 Localized swelling, mass and lump, trunk: Secondary | ICD-10-CM | POA: Diagnosis present

## 2023-06-18 DIAGNOSIS — I890 Lymphedema, not elsewhere classified: Secondary | ICD-10-CM | POA: Diagnosis present

## 2023-06-18 DIAGNOSIS — J45909 Unspecified asthma, uncomplicated: Secondary | ICD-10-CM | POA: Diagnosis not present

## 2023-06-18 DIAGNOSIS — F319 Bipolar disorder, unspecified: Secondary | ICD-10-CM | POA: Diagnosis present

## 2023-06-18 DIAGNOSIS — R0602 Shortness of breath: Secondary | ICD-10-CM | POA: Diagnosis present

## 2023-06-18 DIAGNOSIS — R7303 Prediabetes: Secondary | ICD-10-CM | POA: Diagnosis present

## 2023-06-18 DIAGNOSIS — I5032 Chronic diastolic (congestive) heart failure: Secondary | ICD-10-CM | POA: Diagnosis not present

## 2023-06-18 DIAGNOSIS — I110 Hypertensive heart disease with heart failure: Secondary | ICD-10-CM | POA: Diagnosis not present

## 2023-06-18 DIAGNOSIS — L03116 Cellulitis of left lower limb: Principal | ICD-10-CM | POA: Diagnosis present

## 2023-06-18 DIAGNOSIS — I272 Pulmonary hypertension, unspecified: Secondary | ICD-10-CM | POA: Diagnosis not present

## 2023-06-18 DIAGNOSIS — Z8249 Family history of ischemic heart disease and other diseases of the circulatory system: Secondary | ICD-10-CM

## 2023-06-18 DIAGNOSIS — F419 Anxiety disorder, unspecified: Secondary | ICD-10-CM | POA: Diagnosis not present

## 2023-06-18 DIAGNOSIS — R0902 Hypoxemia: Principal | ICD-10-CM

## 2023-06-18 DIAGNOSIS — Z833 Family history of diabetes mellitus: Secondary | ICD-10-CM

## 2023-06-18 DIAGNOSIS — Z79899 Other long term (current) drug therapy: Secondary | ICD-10-CM

## 2023-06-18 DIAGNOSIS — Z1152 Encounter for screening for COVID-19: Secondary | ICD-10-CM

## 2023-06-18 DIAGNOSIS — E785 Hyperlipidemia, unspecified: Secondary | ICD-10-CM | POA: Diagnosis present

## 2023-06-18 DIAGNOSIS — J9601 Acute respiratory failure with hypoxia: Secondary | ICD-10-CM | POA: Diagnosis not present

## 2023-06-18 DIAGNOSIS — L0390 Cellulitis, unspecified: Secondary | ICD-10-CM | POA: Diagnosis present

## 2023-06-18 DIAGNOSIS — Z23 Encounter for immunization: Secondary | ICD-10-CM

## 2023-06-18 DIAGNOSIS — I10 Essential (primary) hypertension: Secondary | ICD-10-CM | POA: Diagnosis present

## 2023-06-18 DIAGNOSIS — Z887 Allergy status to serum and vaccine status: Secondary | ICD-10-CM

## 2023-06-18 DIAGNOSIS — K760 Fatty (change of) liver, not elsewhere classified: Secondary | ICD-10-CM | POA: Diagnosis present

## 2023-06-18 DIAGNOSIS — Z886 Allergy status to analgesic agent status: Secondary | ICD-10-CM

## 2023-06-18 DIAGNOSIS — J45998 Other asthma: Secondary | ICD-10-CM | POA: Diagnosis not present

## 2023-06-18 DIAGNOSIS — Z9981 Dependence on supplemental oxygen: Secondary | ICD-10-CM

## 2023-06-18 DIAGNOSIS — Z91012 Allergy to eggs: Secondary | ICD-10-CM

## 2023-06-18 DIAGNOSIS — L03119 Cellulitis of unspecified part of limb: Secondary | ICD-10-CM | POA: Diagnosis present

## 2023-06-18 DIAGNOSIS — J4489 Other specified chronic obstructive pulmonary disease: Secondary | ICD-10-CM | POA: Diagnosis present

## 2023-06-18 DIAGNOSIS — J9621 Acute and chronic respiratory failure with hypoxia: Secondary | ICD-10-CM | POA: Diagnosis present

## 2023-06-18 LAB — CBC WITH DIFFERENTIAL/PLATELET
Abs Immature Granulocytes: 0.04 10*3/uL (ref 0.00–0.07)
Basophils Absolute: 0.1 10*3/uL (ref 0.0–0.1)
Basophils Relative: 1 %
Eosinophils Absolute: 0.2 10*3/uL (ref 0.0–0.5)
Eosinophils Relative: 2 %
HCT: 38.3 % (ref 36.0–46.0)
Hemoglobin: 12.3 g/dL (ref 12.0–15.0)
Immature Granulocytes: 0 %
Lymphocytes Relative: 17 %
Lymphs Abs: 1.8 10*3/uL (ref 0.7–4.0)
MCH: 29.1 pg (ref 26.0–34.0)
MCHC: 32.1 g/dL (ref 30.0–36.0)
MCV: 90.8 fL (ref 80.0–100.0)
Monocytes Absolute: 0.6 10*3/uL (ref 0.1–1.0)
Monocytes Relative: 6 %
Neutro Abs: 7.8 10*3/uL — ABNORMAL HIGH (ref 1.7–7.7)
Neutrophils Relative %: 74 %
Platelets: 420 10*3/uL — ABNORMAL HIGH (ref 150–400)
RBC: 4.22 MIL/uL (ref 3.87–5.11)
RDW: 15 % (ref 11.5–15.5)
WBC: 10.6 10*3/uL — ABNORMAL HIGH (ref 4.0–10.5)
nRBC: 0 % (ref 0.0–0.2)

## 2023-06-18 LAB — BASIC METABOLIC PANEL
Anion gap: 12 (ref 5–15)
BUN: 15 mg/dL (ref 6–20)
CO2: 29 mmol/L (ref 22–32)
Calcium: 8.8 mg/dL — ABNORMAL LOW (ref 8.9–10.3)
Chloride: 96 mmol/L — ABNORMAL LOW (ref 98–111)
Creatinine, Ser: 0.79 mg/dL (ref 0.44–1.00)
GFR, Estimated: >60 mL/min (ref >60)
Glucose, Bld: 93 mg/dL (ref 70–99)
Potassium: 4.3 mmol/L (ref 3.5–5.1)
Sodium: 137 mmol/L (ref 135–145)

## 2023-06-18 LAB — LACTIC ACID, PLASMA: Lactic Acid, Venous: 1 mmol/L (ref 0.5–1.9)

## 2023-06-18 LAB — TROPONIN I (HIGH SENSITIVITY)
Troponin I (High Sensitivity): 6 ng/L (ref <18)
Troponin I (High Sensitivity): 5 ng/L (ref <18)

## 2023-06-18 LAB — BRAIN NATRIURETIC PEPTIDE: B Natriuretic Peptide: 61.4 pg/mL (ref 0.0–100.0)

## 2023-06-18 MED ORDER — CLINDAMYCIN PHOSPHATE 600 MG/50ML IV SOLN
600.0000 mg | Freq: Once | INTRAVENOUS | Status: AC
  Administered 2023-06-18: 600 mg via INTRAVENOUS
  Filled 2023-06-18: qty 50

## 2023-06-18 MED ORDER — HYDROCODONE-ACETAMINOPHEN 5-325 MG PO TABS
1.0000 | ORAL_TABLET | Freq: Once | ORAL | Status: AC
  Administered 2023-06-18: 1 via ORAL
  Filled 2023-06-18: qty 1

## 2023-06-18 MED ORDER — FUROSEMIDE 10 MG/ML IJ SOLN
40.0000 mg | Freq: Once | INTRAMUSCULAR | Status: AC
Start: 2023-06-18 — End: 2023-06-18
  Administered 2023-06-18: 40 mg via INTRAVENOUS
  Filled 2023-06-18: qty 4

## 2023-06-18 MED ORDER — IPRATROPIUM-ALBUTEROL 0.5-2.5 (3) MG/3ML IN SOLN
3.0000 mL | RESPIRATORY_TRACT | Status: DC | PRN
  Administered 2023-06-18: 3 mL via RESPIRATORY_TRACT
  Filled 2023-06-18: qty 3

## 2023-06-18 MED ORDER — IOHEXOL 350 MG/ML SOLN
100.0000 mL | Freq: Once | INTRAVENOUS | Status: AC | PRN
  Administered 2023-06-18: 100 mL via INTRAVENOUS

## 2023-06-18 NOTE — ED Notes (Signed)
CTA chest/ 8pm lactic and trop

## 2023-06-18 NOTE — ED Provider Notes (Cosign Needed Addendum)
  Physical Exam  BP (!) 157/91 (BP Location: Right Arm)   Pulse 87   Temp 98 F (36.7 C) (Oral)   Resp (!) 24   Ht 5' (1.524 m)   Wt (!) 181.4 kg   SpO2 98%   BMI 78.12 kg/m   Physical Exam Cardiovascular:     Rate and Rhythm: Normal rate and regular rhythm.  Pulmonary:     Breath sounds: No wheezing.  Chest:     Chest wall: No tenderness.  Musculoskeletal:     Left lower leg: Edema present.     Procedures  .Critical Care  Performed by: Smitty Knudsen, PA-C Authorized by: Smitty Knudsen, PA-C   Critical care provider statement:    Critical care time (minutes):  30   Critical care was necessary to treat or prevent imminent or life-threatening deterioration of the following conditions:  Respiratory failure   Critical care was time spent personally by me on the following activities:  Development of treatment plan with patient or surrogate, discussions with consultants, evaluation of patient's response to treatment, examination of patient, ordering and review of laboratory studies, ordering and review of radiographic studies, ordering and performing treatments and interventions, pulse oximetry, re-evaluation of patient's condition and review of old charts   Care discussed with: admitting provider     ED Course / MDM    Medical Decision Making Amount and/or Complexity of Data Reviewed Labs: ordered. Radiology: ordered.  Risk Prescription drug management. Decision regarding hospitalization.   Patient is presenting with LLE wound with surrounding cellulitis, patient was recently discharged on 7th -patient reports cellulitis originally got better but now she has noted serous drainage from old scar tissue, erythema worsening again after completing antibiotics, photo in chart. SOB pending CTA chest pending, patient reports exertional shortness of breath reports labored breathing with exertion as well.  Antibiotics for wound - started IV clindamycin for MRSA coverage.    Patient desaturates to low 80s with ambulation. Patient dose not typically require oxygen at baseline. Has labored breathing, feels short of breath.  Patient appears fluid overload given lower extremity edema ordered IV Lasix.  Consulting hospitalist for admission given hypoxia and cellulitis.  Hospitalist agrees to admission, will get transferred to [facility] or [facility].  Request COVID testing and inhalers, I have ordered both.  Stable for prescription for admission.      Smitty Knudsen, PA-C 06/18/23 2312    Smitty Knudsen, PA-C 06/18/23 2333    Charlynne Pander, MD 06/19/23 [phone]

## 2023-06-18 NOTE — ED Triage Notes (Signed)
Pt  c/o [facility] and reports continues to have drainage from LLE wound and it had a foul odor this morning; recent admission for acute resp failure

## 2023-06-18 NOTE — ED Notes (Signed)
Ambulated patient. O2 sats started out at 96% and dropped down to 82% while walking. Stopped for a break and catch breathe. Started walking again at 90% and dropped back down into the 80's with labored breathing.

## 2023-06-18 NOTE — Telephone Encounter (Signed)
FYI: This call has been transferred to triage nurse: Access Nurse. Once the result note has been entered staff can address the message at that time.  Patient called in with the following symptoms:  Red Word:Clear fluid drainage and foul smell from wound on left lower leg    Please advise at Mobile [phone] (mobile)  Message is routed to Provider Pool.

## 2023-06-18 NOTE — Telephone Encounter (Signed)
Noted and agreed, thank you. 

## 2023-06-18 NOTE — ED Notes (Signed)
Called [facility] for transport , No Current ETA.. ED Nurse will call floor for report Called @ 23:31

## 2023-06-18 NOTE — Telephone Encounter (Signed)
Final outcome: Go to ED Now.   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 Her wound on left lower leg has discharge and foul smell. Translation No Nurse Assessment Nurse: Charna Elizabeth, RN, Cathy Date/Time (Eastern Time): 06/18/2023 9:57:18 AM Confirm and document reason for call. If symptomatic, describe symptoms. ---Caller states she developed a wound on her left leg several years ago that started to look infected again about 3 weeks ago. No fever. Alert and responsive. Does the patient have any new or worsening symptoms? ---Yes Will a triage be completed? ---Yes Related visit to physician within the last 2 weeks? ---No Does the PT have any chronic conditions? (i.e. diabetes, asthma, this includes High risk factors for pregnancy, etc.) ---Yes List chronic conditions. ---Heart problems, Chronic leg infection, IBS, Asthma, Is this a behavioral health or substance abuse call? ---No Guidelines Guideline Title Affirmed Question Affirmed Notes Nurse Date/Time (Eastern Time) Cellulitis on Antibiotic Follow-up Call SEVERE pain Trumbull, RN, Lynden Ang 06/18/2023 10:00:04 AM Disp. Time Lamount Cohen Time) Disposition Final User 06/18/2023 10:02:15 AM Go to ED Now Yes Charna Elizabeth, RN, Lynden Ang Final Disposition 06/18/2023 10:02:15 AM Go to ED Now Yes  Charna Elizabeth, RN, Frann Rider Disagree/Comply Comply Caller Understands Yes PreDisposition Go to ED Care Advice Given Per Guideline GO TO ED NOW: * You need to be seen in the Emergency Department. * Go to the ED at ___________ Hospital. * Leave now. Drive carefully. CARE ADVICE given per Cellulitis on Antibiotic Follow-up Call (Adult) guideline.  Referrals [facility] - ED

## 2023-06-18 NOTE — ED Provider Notes (Signed)
[place] [facility] Provider Note   CSN: [id number] Arrival date & time: 06/18/23  1535     History  Chief Complaint  Patient presents with   Shortness of Breath   Wound Check    Christina Dominguez is a 57 y.o. female with a past medical history significant for hypertension, hyperlipidemia, asthma, chronic bronchitis, CHF, pulmonary hypertension, anxiety, depression, diverticulosis, morbid obesity, OSA, and chronic left lower extremity wound who presents to the ED due to shortness of breath and left lower extremity wound.  Patient recently admitted the hospital and discharged on 11/2 for cellulitis of left lower extremity and shortness of breath thought to be related to possible CHF exacerbation.  Patient states shortness of breath has worsened since being discharged from the hospital.  Notes shortness of breath is worse with exertion.  Admits to lower extremity edema which has improved since her admission.  Denies associated chest pain.  No cough, fever, or chills.  Has a history of asthma however, denies any wheezing.  She also admits to left lower extremity wound.  She notes she has had malodorous drainage from left lower extremity wound that started this morning. Patient states she woke up in a "puddle of fluid".  She notes she has washed wound twice and noticed a malodor.  No fever or chills.  Patient was discharged with 10 days of cefadroxil which she was compliant with.  History obtained from patient and past medical records. No interpreter used during encounter.       Home Medications Prior to Admission medications   Medication Sig Start Date End Date Taking? Authorizing Provider  acetaminophen (TYLENOL) 650 MG CR tablet Take 1,300 mg by mouth as needed for pain.    [provider]  albuterol (VENTOLIN HFA) 108 (90 Base) MCG/ACT inhaler Inhale 2 puffs into the lungs every 6 (six) hours as needed for wheezing or shortness of  breath. Patient taking differently: Inhale 2-3 puffs into the lungs as needed for wheezing or shortness of breath. 01/30/22   Luciano Cutter, MD  COVID-19 mRNA vaccine [phone] (COMIRNATY) syringe Inject into the muscle. 07/03/22     diclofenac Sodium (VOLTAREN) 1 % GEL Apply 1 Application topically as needed (pain).    [provider]  dicyclomine (BENTYL) 10 MG capsule Take 1 capsule (10 mg total) by mouth 4 (four) times daily -  before meals and at bedtime. Patient taking differently: Take 10-20 mg by mouth as needed for spasms. 10/11/22 06/05/23  Allwardt, Crist Infante, PA-C  diphenhydrAMINE (BENADRYL) 25 MG tablet Take 50 mg by mouth at bedtime as needed for sleep.    [provider]  diphenhydrAMINE HCl, Sleep, (ZZZQUIL PO) Take 3 tablets by mouth at bedtime.    [provider]  furosemide (LASIX) 80 MG tablet TAKE 1 TABLET(80 MG) BY MOUTH DAILY 04/10/23   Georgeanna Lea, MD  potassium chloride SA (KLOR-CON M) 20 MEQ tablet Take 1 tablet (20 mEq total) by mouth daily. Patient not taking: Reported on 06/05/2023 12/18/21   Georgeanna Lea, MD  TURMERIC PO Take 1 tablet by mouth as needed (pain).    [provider]  valsartan (DIOVAN) 40 MG tablet Take 1 tablet (40 mg total) by mouth daily. 10/11/22   Georgeanna Lea, MD      Allergies    Aspirin, Egg-derived products, and Influenza vaccines    Review of Systems   Review of Systems  Constitutional:  Negative for fever.  Respiratory:  Positive for shortness of breath.   Cardiovascular:  Negative for chest pain.  Skin:  Positive for wound.    Physical Exam Updated Vital Signs BP (!) 157/91 (BP Location: Right Arm)   Pulse 87   Temp 98 F (36.7 C) (Oral)   Resp (!) 24   Ht 5' (1.524 m)   Wt (!) 181.4 kg   SpO2 98%   BMI 78.12 kg/m  Physical Exam Vitals and nursing note reviewed.  Constitutional:      General: She is not in acute distress.    Appearance: She is not ill-appearing.   HENT:     Head: Normocephalic.  Eyes:     Pupils: Pupils are equal, round, and reactive to light.  Cardiovascular:     Rate and Rhythm: Normal rate and regular rhythm.     Pulses: Normal pulses.     Heart sounds: Normal heart sounds. No murmur heard.    No friction rub. No gallop.  Pulmonary:     Effort: Pulmonary effort is normal.     Breath sounds: Normal breath sounds.     Comments: Diffcult to auscultate due to body habitus.  Abdominal:     General: Abdomen is flat. There is no distension.     Palpations: Abdomen is soft.     Tenderness: There is no abdominal tenderness. There is no guarding or rebound.  Musculoskeletal:        General: Normal range of motion.     Cervical back: Neck supple.     Comments: Bilateral lower extremity edema  Skin:    General: Skin is warm and dry.     Comments: Wound to left lower extremity.  Clear drainage coming from left lower extremity with some white drainage.  See photo below.  Neurological:     General: No focal deficit present.     Mental Status: She is alert.  Psychiatric:        Mood and Affect: Mood normal.        Behavior: Behavior normal.     ED Results / Procedures / Treatments   Labs (all labs ordered are listed, but only abnormal results are displayed) Labs Reviewed  CBC WITH DIFFERENTIAL/PLATELET - Abnormal; Notable for the following components:      Result Value   WBC 10.6 (*)    Platelets 420 (*)    Neutro Abs 7.8 (*)    All other components within normal limits  BASIC METABOLIC PANEL - Abnormal; Notable for the following components:   Chloride 96 (*)    Calcium 8.8 (*)    All other components within normal limits  LACTIC ACID, PLASMA  BRAIN NATRIURETIC PEPTIDE  LACTIC ACID, PLASMA  TROPONIN I (HIGH SENSITIVITY)  TROPONIN I (HIGH SENSITIVITY)    EKG EKG Interpretation Date/Time:  Tuesday June 18 2023 15:49:41 EST Ventricular Rate:  67 PR Interval:  162 QRS Duration:  82 QT Interval:  420 QTC  Calculation: 443 R Axis:   50  Text Interpretation: Normal sinus rhythm Cannot rule out Anterior infarct , age undetermined Abnormal ECG When compared with ECG of 04-Jun-2023 16:42, PREVIOUS ECG IS PRESENT Confirmed by Richardean Canal [phone]) on 06/18/2023 5:52:10 PM  Radiology No results found.  Procedures Procedures    Medications Ordered in ED Medications  HYDROcodone-acetaminophen (NORCO/VICODIN) 5-325 MG per tablet 1 tablet (1 tablet Oral Given 06/18/23 1739)    ED Course/ Medical Decision Making/ A&P  Medical Decision Making Amount and/or Complexity of Data Reviewed External Data Reviewed: notes.    Details: Admission notes Labs: ordered. Decision-making details documented in ED Course. Radiology: ordered and independent interpretation performed. Decision-making details documented in ED Course. ECG/medicine tests: ordered and independent interpretation performed. Decision-making details documented in ED Course.  Risk Prescription drug management.   This patient presents to the ED for concern of SOB/wound, this involves an extensive number of treatment options, and is a complaint that carries with it a high risk of complications and morbidity.  The differential diagnosis includes cellulitis, DVT, osteomyelitis, etc  57 year old female presents to the ED due to shortness of breath and left lower extremity wound.  He admitted for same and discharged on 11/2.  History of CHF.  Patient states she woke up in a "puddle of fluid" this morning with malodorous draining coming from left lower extremity wound.  Patient has been compliant with her antibiotics since discharge.  No fever or chills.  Upon arrival patient afebrile, not tachycardic or hypoxic.  Slightly tachypneic.  Patient in no acute distress.  Difficult to auscultate lungs due to body habitus.  Bilateral lower extremity edema.  Wound to left lower extremity with clear and some white drainage.   See photo above.  Routine labs ordered.  BNP to rule out CHF exacerbation.  Chest x-ray to rule out evidence of pneumonia.  Will obtain x-ray to rule out evidence of osteomyelitis given chronic wound.  Lower suspicion for DVT given negative ultrasound during her recent admission.  Patient handed off to Jasmine Pang, PA-C pending work-up and reassessment.        Final Clinical Impression(s) / ED Diagnoses Final diagnoses:  Shortness of breath  Wound of left lower extremity, initial encounter    Rx / DC Orders ED Discharge Orders     None         Jesusita Oka 06/18/23 1843    Charlynne Pander, MD 06/18/23 [phone]

## 2023-06-18 NOTE — ED Notes (Signed)
Pt ambualted with pulseox, SPO2 down to 82% with ambulation, up to 95% when resting. Provider made aware

## 2023-06-18 NOTE — Progress Notes (Signed)
Plan of Care Note for accepted transfer   Patient: Christina Dominguez MRN: [id number]   DOA: 06/18/2023  Facility requesting transfer: [facility]  Requesting Provider: Jasmine Pang, PA; Chaney Malling MD  Reason for transfer: Cellulitis, AHRF  Facility course:   57 y/o F with hx of morbid obesity, HTN, dCHF, pHTN, chronic bronchitis, and lymphedema, recent admission 10/29 - 11/2 and treated for cellulitis of the LLE and should have completed 10 day total course of antibiotics with cefadroxil. Returned to [facility] with worsening appearance, drainage from LLE wound again. Also desat with ambulation, 82% but nml on RA at rest. Tachypneic. Afrebrile. Labs WBC 10, BNP 61. 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 Treated with Clindamycin for cellulitis, lasix 40 IV. Advised to treat with nebs for small airway disease / asthma.   Plan of care: The patient is accepted for admission to Telemetry unit, at [facility] or [facility]   Author: Dolly Rias, MD 06/18/2023  Check [email or web address] for on-call coverage.  Nursing staff, Please call [facility] Admits & Consults System-Wide number on Amion as soon as patient's arrival, so appropriate admitting provider can evaluate the pt.

## 2023-06-19 ENCOUNTER — Other Ambulatory Visit: Payer: Self-pay

## 2023-06-19 DIAGNOSIS — I5032 Chronic diastolic (congestive) heart failure: Secondary | ICD-10-CM

## 2023-06-19 DIAGNOSIS — L03116 Cellulitis of left lower limb: Secondary | ICD-10-CM | POA: Diagnosis not present

## 2023-06-19 DIAGNOSIS — R7303 Prediabetes: Secondary | ICD-10-CM

## 2023-06-19 DIAGNOSIS — J45909 Unspecified asthma, uncomplicated: Secondary | ICD-10-CM

## 2023-06-19 DIAGNOSIS — I10 Essential (primary) hypertension: Secondary | ICD-10-CM

## 2023-06-19 DIAGNOSIS — L0390 Cellulitis, unspecified: Secondary | ICD-10-CM | POA: Diagnosis present

## 2023-06-19 LAB — CBC
HCT: 34.8 % — ABNORMAL LOW (ref 36.0–46.0)
Hemoglobin: 10.9 g/dL — ABNORMAL LOW (ref 12.0–15.0)
MCH: 29.4 pg (ref 26.0–34.0)
MCHC: 31.3 g/dL (ref 30.0–36.0)
MCV: 93.8 fL (ref 80.0–100.0)
Platelets: 359 10*3/uL (ref 150–400)
RBC: 3.71 MIL/uL — ABNORMAL LOW (ref 3.87–5.11)
RDW: 15.3 % (ref 11.5–15.5)
WBC: 9.6 10*3/uL (ref 4.0–10.5)
nRBC: 0 % (ref 0.0–0.2)

## 2023-06-19 LAB — BASIC METABOLIC PANEL
Anion gap: 9 (ref 5–15)
BUN: 18 mg/dL (ref 6–20)
CO2: 32 mmol/L (ref 22–32)
Calcium: 8.3 mg/dL — ABNORMAL LOW (ref 8.9–10.3)
Chloride: 96 mmol/L — ABNORMAL LOW (ref 98–111)
Creatinine, Ser: 0.93 mg/dL (ref 0.44–1.00)
GFR, Estimated: >60 mL/min (ref >60)
Glucose, Bld: 92 mg/dL (ref 70–99)
Potassium: 3.8 mmol/L (ref 3.5–5.1)
Sodium: 137 mmol/L (ref 135–145)

## 2023-06-19 LAB — HEMOGLOBIN A1C
Hgb A1c MFr Bld: 5.8 % — ABNORMAL HIGH (ref 4.8–5.6)
Mean Plasma Glucose: 119.76 mg/dL

## 2023-06-19 LAB — RESP PANEL BY RT-PCR (RSV, FLU A&B, COVID)  RVPGX2
Influenza A by PCR: NEGATIVE
Influenza B by PCR: NEGATIVE
Resp Syncytial Virus by PCR: NEGATIVE
SARS Coronavirus 2 by RT PCR: NEGATIVE

## 2023-06-19 MED ORDER — VANCOMYCIN HCL 1250 MG/250ML IV SOLN
1250.0000 mg | Freq: Once | INTRAVENOUS | Status: AC
  Administered 2023-06-19: 1250 mg via INTRAVENOUS
  Filled 2023-06-19: qty 250

## 2023-06-19 MED ORDER — VANCOMYCIN HCL IN DEXTROSE 1-5 GM/200ML-% IV SOLN
1000.0000 mg | INTRAVENOUS | Status: DC
  Administered 2023-06-20 – 2023-06-21 (×2): 1000 mg via INTRAVENOUS
  Filled 2023-06-19 (×3): qty 200

## 2023-06-19 MED ORDER — FUROSEMIDE 10 MG/ML IJ SOLN
40.0000 mg | INTRAMUSCULAR | Status: DC
  Administered 2023-06-20 – 2023-06-21 (×3): 40 mg via INTRAVENOUS
  Filled 2023-06-19 (×3): qty 4

## 2023-06-19 MED ORDER — ENOXAPARIN SODIUM 100 MG/ML IJ SOSY
90.0000 mg | PREFILLED_SYRINGE | INTRAMUSCULAR | Status: DC
  Administered 2023-06-19 – 2023-06-21 (×3): 90 mg via SUBCUTANEOUS
  Filled 2023-06-19 (×3): qty 1

## 2023-06-19 MED ORDER — FUROSEMIDE 10 MG/ML IJ SOLN
40.0000 mg | Freq: Two times a day (BID) | INTRAMUSCULAR | Status: DC
Start: 2023-06-19 — End: 2023-06-19
  Administered 2023-06-19: 40 mg via INTRAVENOUS
  Filled 2023-06-19 (×2): qty 4

## 2023-06-19 MED ORDER — VANCOMYCIN HCL 10 G IV SOLR: 2250.0000 mg | INTRAVENOUS | Status: DC

## 2023-06-19 MED ORDER — VANCOMYCIN HCL IN DEXTROSE 1-5 GM/200ML-% IV SOLN
1000.0000 mg | INTRAVENOUS | Status: DC
  Administered 2023-06-20 – 2023-06-21 (×2): 1000 mg via INTRAVENOUS
  Filled 2023-06-19 (×2): qty 200

## 2023-06-19 MED ORDER — VANCOMYCIN HCL IN DEXTROSE 1-5 GM/200ML-% IV SOLN
1000.0000 mg | Freq: Once | INTRAVENOUS | Status: AC
  Administered 2023-06-19: 1000 mg via INTRAVENOUS
  Filled 2023-06-19: qty 200

## 2023-06-19 MED ORDER — IPRATROPIUM-ALBUTEROL 0.5-2.5 (3) MG/3ML IN SOLN: 3.0000 mL | Freq: Four times a day (QID) | RESPIRATORY_TRACT | Status: DC | PRN

## 2023-06-19 MED ORDER — ACETAMINOPHEN 650 MG RE SUPP: 650.0000 mg | Freq: Four times a day (QID) | RECTAL | Status: DC | PRN

## 2023-06-19 MED ORDER — VANCOMYCIN HCL 2000 MG/400ML IV SOLN
2000.0000 mg | INTRAVENOUS | Status: DC
  Filled 2023-06-19: qty 400

## 2023-06-19 MED ORDER — ACETAMINOPHEN 325 MG PO TABS
650.0000 mg | ORAL_TABLET | Freq: Four times a day (QID) | ORAL | Status: DC | PRN
  Administered 2023-06-21: 650 mg via ORAL
  Filled 2023-06-19: qty 2

## 2023-06-19 NOTE — Progress Notes (Signed)
Pharmacy Antibiotic Note  Christina Dominguez is a 57 y.o. female with hx lymphedema who was recently hospitalized from 06/04/23 to 06/08/23 for LLE cellulitis. She was treated with ceftriaxone inpatient and discharged on cefadroxil for 8 days. She presented back to the ED on 06/18/2023 with c/o SOB and drainage from LLE wound. She's currently on vancomycin for infection.  Today, 06/19/2023: - scr 0.93 (crcl~100) - wbc wnl  - afeb  Plan: - adjust vancomycin dose to 2000 mg IV q24h for est AUC 505  _______________________________________________  Height: 5' (152.4 cm) Weight: (!) 181.4 kg (400 lb) IBW/kg (Calculated) : 45.5  Temp (24hrs), Avg:98 F (36.7 C), Min:97.6 F (36.4 C), Max:98.2 F (36.8 C)  Recent Labs  Lab 06/18/23 1654 06/19/23 0505  WBC 10.6* 9.6  CREATININE 0.79 0.93  LATICACIDVEN 1.0  --     Estimated Creatinine Clearance: 104 mL/min (by C-G formula based on SCr of 0.93 mg/dL).    Allergies  Allergen Reactions   Aspirin Nausea And Vomiting   Egg-Derived Products Swelling   Influenza Vaccines Swelling     Thank you for allowing pharmacy to be a part of this patient's care.  Lucia Gaskins 06/19/2023 6:54 AM

## 2023-06-19 NOTE — Consult Note (Addendum)
WOC Nurse Consult Note: Reason for Consult: Requested a consult to assess a left wound leg, by the end of a old scar. Probably is a injury or infection (cellulitis) below the skin. Performed remotely after looking for photo and team description. Wound type: full thickness, aproxx. 1 cm. Periwound: moisture/ color white. Dressing procedure/placement/frequency: Aquacel change daily, ABD to drain the exudate, change daily.  [facility] Acquanetta Sit BSN, RN, WOC nurse.  Cammie Mcgee [facility] nurse

## 2023-06-19 NOTE — H&P (Signed)
History and Physical    Christina Dominguez TFT:[id number] DOB: 08-31-1965 DOA: 06/18/2023  PCP: Bary Leriche, PA-C  Patient coming from: [facility] ED  Chief Complaint: Shortness of breath, left lower extremity wound  HPI: Christina Dominguez is a 58 y.o. female with medical history significant of chronic HFpEF, hypertension, hyperlipidemia, pulmonary hypertension, asthma, chronic bronchitis, lymphedema, severe morbid obesity, OSA, anxiety, depression, bipolar disorder, prediabetes, chronic left lower extremity wound.  Recently admitted 10/29-11/2 for left lower extremity cellulitis and was discharged on cefadroxil x 10 days.  Left lower extremity Doppler ultrasound done during this admission was negative for DVT.    Patient presented to the ED yesterday for evaluation of shortness of breath and left lower extremity wound with malodorous drainage.  Afebrile and not tachycardic. Not hypoxic at rest but desatted to the low 80s with ambulation, placed on 2 L [place].  Tachypneic.  Labs notable for WBC count 10.6, creatinine 0.7, troponin negative x 2, lactic acid normal, BNP normal, COVID/influenza/RSV PCR negative.  CTA chest negative for PE, pulmonary edema, or pneumonia.  Showing small airway disease and stable right paraspinal posterior mediastinal mass which was previously characterized as benign cystic lesion on MRI in March 2023.  X-ray of left tibia/fibula negative for acute osseous abnormality. Patient was given IV Lasix 40 mg, Norco, clindamycin, and DuoNeb treatment in the ED.  History limited as patient is somnolent.  States she finished her antibiotic course since after she left the hospital but since yesterday having increasing drainage from her left lower extremity wound.  She reports history of chronic edema of her left lower extremity/lymphedema.  Also she is having dyspnea with exertion since yesterday.  Reports history of asthma for which she uses albuterol as needed.  Not coughing much.   Denies fevers or chest pain.  No other complaints.  Denies nausea, vomiting, or abdominal pain.  She reports history of sleep apnea for she uses 2 L supplemental oxygen at night and currently in the process of getting a CPAP machine.  Review of Systems:  Review of Systems  All other systems reviewed and are negative.   Past Medical History:  Diagnosis Date   Anxiety    Aortic atherosclerosis (HCC) 08/19/2021   Arthritis    Asthma    Bipolar 1 disorder (HCC)    Chronic diastolic CHF (congestive heart failure) (HCC)    Class 3 severe obesity with serious comorbidity and body mass index (BMI) greater than or equal to 70 in adult [facility]) 12/21/2019   Depression    Diastolic dysfunction 08/02/2021   Diverticulosis 08/19/2021   Edema of both lower extremities    Hepatic steatosis 08/19/2021   Hepatomegaly 08/19/2021   High blood pressure    Hyperlipidemia    Joint pain    Morbid obesity (HCC)    Pre-diabetes     Past Surgical History:  Procedure Laterality Date   APPLICATION OF WOUND VAC Left 12/20/2021   Procedure: APPLICATION OF WOUND VAC;  Surgeon: Nadara Mustard, MD;  Location: [facility] OR;  Service: Orthopedics;  Laterality: Left;   I & D EXTREMITY Left 12/20/2021   Procedure: EXCISIONAL DEBRIDEMENT HEMATOMA LEFT LEG;  Surgeon: Nadara Mustard, MD;  Location: [facility] OR;  Service: Orthopedics;  Laterality: Left;   NO PAST SURGERIES     RIGHT/LEFT HEART CATH AND CORONARY ANGIOGRAPHY N/A 10/06/2021   Procedure: RIGHT/LEFT HEART CATH AND CORONARY ANGIOGRAPHY;  Surgeon: Corky Crafts, MD;  Location: [facility] INVASIVE CV LAB;  Service: Cardiovascular;  Laterality: N/A;     reports that she has never smoked. She has never been exposed to tobacco smoke. She has never used smokeless tobacco. She reports that she does not currently use alcohol. She reports that she does not use drugs.  Allergies  Allergen Reactions   Aspirin Nausea And Vomiting   Egg-Derived Products Swelling   Influenza Vaccines  Swelling    Family History  Problem Relation Age of Onset   Heart Problems Mother        Broken heart syndrome   Hypertension Mother    Hypercholesterolemia Mother    Bone cancer Father    Testicular cancer Father    Heart attack Sister    Diabetes Brother    Colon cancer Maternal Grandfather    Dementia Paternal Grandmother     Prior to Admission medications   Medication Sig Start Date End Date Taking? Authorizing Provider  acetaminophen (TYLENOL) 650 MG CR tablet Take 1,300 mg by mouth as needed for pain.    [provider]  albuterol (VENTOLIN HFA) 108 (90 Base) MCG/ACT inhaler Inhale 2 puffs into the lungs every 6 (six) hours as needed for wheezing or shortness of breath. Patient taking differently: Inhale 2-3 puffs into the lungs as needed for wheezing or shortness of breath. 01/30/22   Luciano Cutter, MD  COVID-19 mRNA vaccine [phone] (COMIRNATY) syringe Inject into the muscle. 07/03/22     diclofenac Sodium (VOLTAREN) 1 % GEL Apply 1 Application topically as needed (pain).    [provider]  dicyclomine (BENTYL) 10 MG capsule Take 1 capsule (10 mg total) by mouth 4 (four) times daily -  before meals and at bedtime. Patient taking differently: Take 10-20 mg by mouth as needed for spasms. 10/11/22 06/05/23  Allwardt, Crist Infante, PA-C  diphenhydrAMINE (BENADRYL) 25 MG tablet Take 50 mg by mouth at bedtime as needed for sleep.    [provider]  diphenhydrAMINE HCl, Sleep, (ZZZQUIL PO) Take 3 tablets by mouth at bedtime.    [provider]  furosemide (LASIX) 80 MG tablet TAKE 1 TABLET(80 MG) BY MOUTH DAILY 04/10/23   Georgeanna Lea, MD  potassium chloride SA (KLOR-CON M) 20 MEQ tablet Take 1 tablet (20 mEq total) by mouth daily. Patient not taking: Reported on 06/05/2023 12/18/21   Georgeanna Lea, MD  TURMERIC PO Take 1 tablet by mouth as needed (pain).    [provider]  valsartan (DIOVAN) 40 MG tablet Take 1 tablet (40 mg  total) by mouth daily. 10/11/22   Georgeanna Lea, MD    Physical Exam: Vitals:   06/18/23 2315 06/18/23 2345 06/19/23 0000 06/19/23 0140  BP: (!) 157/91   124/74  Pulse: 84 77 80 80  Resp: (!) 31 (!) 27 (!) 28   Temp:    97.6 F (36.4 C)  TempSrc:    Oral  SpO2: 93% 100% 99% 95%  Weight:      Height:        Physical Exam Vitals reviewed.  Constitutional:      General: She is not in acute distress. HENT:     Head: Normocephalic and atraumatic.  Eyes:     Extraocular Movements: Extraocular movements intact.  Cardiovascular:     Rate and Rhythm: Normal rate and regular rhythm.     Pulses: Normal pulses.  Pulmonary:     Effort: Pulmonary effort is normal. No respiratory distress.     Breath sounds: No wheezing, rhonchi or rales.  Abdominal:  General: Bowel sounds are normal. There is no distension.     Palpations: Abdomen is soft.     Tenderness: There is no abdominal tenderness. There is no guarding.  Musculoskeletal:     Cervical back: Normal range of motion.     Left lower leg: Edema present.     Comments: Wound noted on the lateral aspect of the left lower leg with drainage and surrounding erythema, see image.  Skin:    General: Skin is warm and dry.  Neurological:     General: No focal deficit present.     Mental Status: She is alert and oriented to person, place, and time.     Labs on Admission: I have personally reviewed following labs and imaging studies  CBC: Recent Labs  Lab 06/18/23 1654  WBC 10.6*  NEUTROABS 7.8*  HGB 12.3  HCT 38.3  MCV 90.8  PLT 420*   Basic Metabolic Panel: Recent Labs  Lab 06/18/23 1654  NA 137  K 4.3  CL 96*  CO2 29  GLUCOSE 93  BUN 15  CREATININE 0.79  CALCIUM 8.8*   GFR: Estimated Creatinine Clearance: 120.9 mL/min (by C-G formula based on SCr of 0.79 mg/dL). Liver Function Tests: No results for input(s): "AST", "ALT", "ALKPHOS", "BILITOT", "PROT", "ALBUMIN" in the last 168 hours. No results for  input(s): "LIPASE", "AMYLASE" in the last 168 hours. No results for input(s): "AMMONIA" in the last 168 hours. Coagulation Profile: No results for input(s): "INR", "PROTIME" in the last 168 hours. Cardiac Enzymes: No results for input(s): "CKTOTAL", "CKMB", "CKMBINDEX", "TROPONINI" in the last 168 hours. BNP (last 3 results) No results for input(s): "PROBNP" in the last 8760 hours. HbA1C: No results for input(s): "HGBA1C" in the last 72 hours. CBG: No results for input(s): "GLUCAP" in the last 168 hours. Lipid Profile: No results for input(s): "CHOL", "HDL", "LDLCALC", "TRIG", "CHOLHDL", "LDLDIRECT" in the last 72 hours. Thyroid Function Tests: No results for input(s): "TSH", "T4TOTAL", "FREET4", "T3FREE", "THYROIDAB" in the last 72 hours. Anemia Panel: No results for input(s): "VITAMINB12", "FOLATE", "FERRITIN", "TIBC", "IRON", "RETICCTPCT" in the last 72 hours. Urine analysis:    Component Value Date/Time   COLORURINE YELLOW 08/18/2021 1253   APPEARANCEUR CLEAR 08/18/2021 1253   LABSPEC 1.010 08/18/2021 1253   PHURINE 5.5 08/18/2021 1253   GLUCOSEU NEGATIVE 08/18/2021 1253   HGBUR TRACE (A) 08/18/2021 1253   BILIRUBINUR NEGATIVE 08/18/2021 1253   KETONESUR NEGATIVE 08/18/2021 1253   PROTEINUR NEGATIVE 08/18/2021 1253   NITRITE NEGATIVE 08/18/2021 1253   LEUKOCYTESUR NEGATIVE 08/18/2021 1253    Radiological Exams on Admission: CT Angio Chest PE W and/or Wo Contrast  Result Date: 06/18/2023 CLINICAL DATA:  Chronic left lower extremity wound shortness of breath EXAM: CT ANGIOGRAPHY CHEST WITH CONTRAST TECHNIQUE: Multidetector CT imaging of the chest was performed using the standard protocol during bolus administration of intravenous contrast. Multiplanar CT image reconstructions and MIPs were obtained to evaluate the vascular anatomy. RADIATION DOSE REDUCTION: This exam was performed according to the departmental dose-optimization program which includes automated exposure  control, adjustment of the mA and/or kV according to patient size and/or use of iterative reconstruction technique. CONTRAST:  OMNIPAQUE IOHEXOL 350 MG/ML SOLN COMPARISON:  Chest x-ray 06/18/2023, chest CT 10/03/2021, MRI 10/05/2021 FINDINGS: Cardiovascular: Satisfactory opacification of the pulmonary arteries to the segmental level. No evidence of pulmonary embolism. Dilated pulmonary trunk measuring up to 4.3 cm consistent with arterial hypertension. Nonaneurysmal aorta without dissection. Mild cardiomegaly. No pericardial effusion. Mediastinum/Nodes: Midline trachea. No  thyroid mass. No suspicious lymph nodes. 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 Esophagus unremarkable Lungs/Pleura: Bilateral mosaic attenuation. No acute consolidation, pleural effusion or pneumothorax. Upper Abdomen: No acute finding Musculoskeletal: No acute osseous abnormality Review of the MIP images confirms the above findings. IMPRESSION: 1. Negative for acute pulmonary embolus or aortic dissection. 2. Dilated pulmonary trunk consistent with arterial hypertension. Mild cardiomegaly. 3. Bilateral mosaic attenuation suggesting small airways disease. 4. 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 Electronically Signed   By: Jasmine Pang M.D.   On: 06/18/2023 22:15   DG Tibia/Fibula Left  Result Date: 06/18/2023 CLINICAL DATA:  Wound to lower part of leg EXAM: LEFT TIBIA AND FIBULA - 2 VIEW COMPARISON:  11/26/2021 FINDINGS: No fracture or malalignment. No periostitis or osseous destructive change. Advanced arthropathy at the knee. Generalized soft tissue edema. Large posterior calcaneal enthesophyte IMPRESSION: No acute osseous abnormality Electronically Signed   By: Jasmine Pang M.D.   On: 06/18/2023 19:17   DG Chest 2  View  Result Date: 06/18/2023 CLINICAL DATA:  Shortness of breath EXAM: CHEST - 2 VIEW COMPARISON:  Chest x-ray 06/04/2023 FINDINGS: The heart size and mediastinal contours are within normal limits. Both lungs are clear. The visualized skeletal structures are unremarkable. IMPRESSION: No active cardiopulmonary disease. Electronically Signed   By: Jasmine Pang M.D.   On: 06/18/2023 19:15    EKG: Independently reviewed.  Sinus rhythm, no acute ischemic changes.  Assessment and Plan  Left lower extremity cellulitis/wound No fever, tachycardia, leukocytosis, or lactic acidosis.  No signs of sepsis.  X-ray of left tibia/fibula negative for signs of osteomyelitis.  Wound has some purulent drainage.  Continue antibiotic treatment with vancomycin.  Avoid clindamycin due to risk of C. difficile.  Left lower extremity edema/chronic lymphedema 4+ pitting edema of left lower extremity and Doppler ultrasound done during recent admission was negative for DVT.  Continue IV Lasix 40 mg twice daily.  Chronic HFpEF Last echo done in January 2023 showing EF 55 to 60%, grade 1 diastolic dysfunction, normal RV systolic function.  BNP normal in the setting of super morbid obesity.  She does have chronic lymphedema/left lower extremity edema but no evidence of pulmonary edema or pleural effusion on CT chest.  Continue IV Lasix for diuresis as mentioned above.  Acute hypoxemic respiratory failure Chronic asthma Workup not suggestive of ACS.  COVID/influenza/RSV PCR negative.  CTA chest negative for PE, pulmonary edema, pleural effusion, or pneumonia.  Examination limited due to patient's body habitus but no obvious wheezing appreciated on auscultation of the lungs.  Not hypoxic at rest but desatted to low 80s with ambulation.  Currently stable on 2 L [place].  No signs of respiratory distress.  Continue DuoNeb as needed.  Do not think steroids are indicated at this time.  Continue supplemental oxygen.  OSA on nocturnal 2  L oxygen Continue supplemental oxygen at night.  Prediabetes A1c 5.9 in January 2023, repeat ordered.  Hypertension: Blood pressure currently stable. Hyperlipidemia Anxiety and depression Bipolar disorder Pharmacy med rec pending.  DVT prophylaxis: Lovenox Code Status: Full Code (discussed with the patient) Level of care: Telemetry bed Admission status: It is my clinical opinion that admission to INPATIENT is reasonable and necessary because of the expectation that this patient will require hospital care that crosses at least 2 midnights  to treat this condition based on the medical complexity of the problems presented.  Given the aforementioned information, the predictability of an adverse outcome is felt to be significant.  John Giovanni MD [facility]  If 7PM-7AM, please contact night-coverage [email or web address]  06/19/2023, 2:01 AM

## 2023-06-19 NOTE — Progress Notes (Signed)
Mobility Specialist - Progress Note   06/19/23 1300  Mobility  Activity Ambulated with assistance in hallway  Level of Assistance Standby assist, set-up cues, supervision of patient - no hands on  Assistive Device Front wheel walker  Distance Ambulated (ft) 180 ft  Range of Motion/Exercises Active  Activity Response Tolerated well  Mobility Referral Yes  $Mobility charge 1 Mobility  Mobility Specialist Start Time (ACUTE ONLY) 1255  Mobility Specialist Stop Time (ACUTE ONLY) 1310  Mobility Specialist Time Calculation (min) (ACUTE ONLY) 15 min   Received in bed and agreed to mobility. Had no issues throughout session. Returned to bed with all needs met.  Marilynne Halsted Mobility Specialist

## 2023-06-19 NOTE — Progress Notes (Signed)
Pharmacy Antibiotic Note  Christina Dominguez is a 57 y.o. female admitted on 06/18/2023 with evaluation of shortness of breath and left lower extremity wound with malodorous drainage. Marland Kitchen  Pharmacy has been consulted for vancomycin dosing.  Plan: Vancomycin 2250mg  IV x 1 then 2250mg  q24h (AUC 495.2, Scr 0.8) Follow renal function and clinical course  Height: 5' (152.4 cm) Weight: (!) 181.4 kg (400 lb) IBW/kg (Calculated) : 45.5  Temp (24hrs), Avg:97.9 F (36.6 C), Min:97.6 F (36.4 C), Max:98.2 F (36.8 C)  Recent Labs  Lab 06/18/23 1654  WBC 10.6*  CREATININE 0.79  LATICACIDVEN 1.0    Estimated Creatinine Clearance: 120.9 mL/min (by C-G formula based on SCr of 0.79 mg/dL).    Allergies  Allergen Reactions   Aspirin Nausea And Vomiting   Egg-Derived Products Swelling   Influenza Vaccines Swelling    Antimicrobials this admission: 11/13 vanc >> 11/12 clinda x 1  Dose adjustments this admission:   Microbiology results:   Thank you for allowing pharmacy to be a part of this patient's care.  Arley Phenix RPh 06/19/2023, 3:05 AM

## 2023-06-19 NOTE — Plan of Care (Signed)
  Problem: Education: Goal: Knowledge of General Education information will improve Description: Including pain rating scale, medication(s)/side effects and non-pharmacologic comfort measures Outcome: Progressing   Problem: Health Behavior/Discharge Planning: Goal: Ability to manage health-related needs will improve Outcome: Progressing   Problem: Clinical Measurements: Goal: Ability to maintain clinical measurements within normal limits will improve Outcome: Progressing Goal: Will remain free from infection Outcome: Progressing Goal: Diagnostic test results will improve Outcome: Progressing Goal: Respiratory complications will improve Outcome: Progressing Goal: Cardiovascular complication will be avoided Outcome: Progressing   Problem: Activity: Goal: Risk for activity intolerance will decrease Outcome: Progressing   Problem: Nutrition: Goal: Adequate nutrition will be maintained Outcome: Progressing   Problem: Coping: Goal: Level of anxiety will decrease Outcome: Progressing   Problem: Elimination: Goal: Will not experience complications related to bowel motility Outcome: Progressing Goal: Will not experience complications related to urinary retention Outcome: Progressing   Problem: Pain Management: Goal: General experience of comfort will improve Outcome: Progressing   Problem: Safety: Goal: Ability to remain free from injury will improve Outcome: Progressing   Problem: Skin Integrity: Goal: Risk for impaired skin integrity will decrease Outcome: Progressing   Problem: Clinical Measurements: Goal: Ability to avoid or minimize complications of infection will improve Outcome: Progressing   Problem: Skin Integrity: Goal: Skin integrity will improve Outcome: Progressing

## 2023-06-19 NOTE — Progress Notes (Signed)
Same day note  Christina Dominguez is a 57 y.o. female with medical history significant of chronic HFpEF, hypertension, hyperlipidemia, pulmonary hypertension, asthma, chronic bronchitis, lymphedema, severe morbid obesity, OSA, anxiety, depression, bipolar disorder, prediabetes, chronic left lower extremity wound who was recently admitted 10/29-11/2 for left lower extremity cellulitis and was discharged on cefadroxil x 10 days.  Left lower extremity Doppler ultrasound done during this admission was negative for DVT.  Patient presents at this time with increasing drainage from the wound with shortness of breath.  She had desaturation with pulse ox of 80% on ambulation and was put on 2 L of nasal cannula on presentation.  Labs with negative.  BNP normal.  CTA chest without PE.  Possible small airway disease.  X-ray of left tibia/fibula negative for acute osseous abnormality. Patient was given IV Lasix 40 mg, Norco, clindamycin, and DuoNeb treatment in the ED.  Patient seen and examined at bedside.  Patient was admitted to the hospital for leg swelling and redness  At the time of my evaluation, patient complains of leg swelling drainage.  Physical examination reveals morbidly obese female with lower extremity edema and cellulitis.  Laboratory data and imaging was reviewed    Assessment and Plan   Left lower extremity cellulitis/wound No signs of sepsis.  X-ray of the leg negative for osteomyelitis.  On IV vancomycin.  Avoiding clindamycin.    Left lower extremity edema/chronic lymphedema Presented with 4+ pitting edema of left lower extremity and Doppler ultrasound done during recent admission was negative for DVT.  Continue IV Lasix 40 mg twice daily.   Chronic HFpEF Last echo done in January 2023 showing EF 55 to 60%, grade 1 diastolic dysfunction, normal RV systolic function.  BNP normal in the setting of super morbid obesity.   Continue IV Lasix   Acute hypoxemic respiratory  failure Chronic asthma  Not hypoxic at rest but desatted to low 80s with ambulation.  Currently stable on 2 L [place].  No signs of respiratory distress.  Continue DuoNeb as needed.  Hold off with steroids for now.  OSA on nocturnal 2 L oxygen Continue supplemental oxygen at night.   Prediabetes Hemoglobin A1c 5.9 in January 2023   Hypertension: Blood pressure currently stable.  Hyperlipidemia Anxiety and depression Bipolar disorder Pharmacy med rec pending.   No Charge  Signed,  Tenny Craw, MD [facility]

## 2023-06-19 NOTE — Hospital Course (Signed)
Same day note  Christina Dominguez is a 57 y.o. female with medical history significant of chronic HFpEF, hypertension, hyperlipidemia, pulmonary hypertension, asthma, chronic bronchitis, lymphedema, severe morbid obesity, OSA, anxiety, depression, bipolar disorder, prediabetes, chronic left lower extremity wound who was recently admitted 10/29-11/2 for left lower extremity cellulitis and was discharged on cefadroxil x 10 days.  Left lower extremity Doppler ultrasound done during this admission was negative for DVT.  Patient presents at this time with increasing drainage from the wound with shortness of breath.  She had desaturation with pulse ox of 80% on ambulation and was put on 2 L of nasal cannula on presentation.  Labs with negative.  BNP normal.  CTA chest without PE.  Possible small airway disease.  X-ray of left tibia/fibula negative for acute osseous abnormality. Patient was given IV Lasix 40 mg, Norco, clindamycin, and DuoNeb treatment in the ED.   Patient seen and examined at bedside.  Patient was admitted to the hospital for  At the time of my evaluation, patient complains of  Physical examination reveals  Laboratory data and imaging was reviewed    Assessment and Plan   Left lower extremity cellulitis/wound No signs of sepsis.  X-ray negative for osteomyelitis.  On vancomycin.  Avoiding clindamycin.    Left lower extremity edema/chronic lymphedema 4+ pitting edema of left lower extremity and Doppler ultrasound done during recent admission was negative for DVT.  Continue IV Lasix 40 mg twice daily.   Chronic HFpEF Last echo done in January 2023 showing EF 55 to 60%, grade 1 diastolic dysfunction, normal RV systolic function.  BNP normal in the setting of super morbid obesity.  Hypertensive pulmonary edema.  Continue IV Lasix   Acute hypoxemic respiratory failure Chronic asthma  Not hypoxic at rest but desatted to low 80s with ambulation.  Currently stable on 2 L [place].  No signs  of respiratory distress.  Continue DuoNeb as needed.  Hold off with steroids for now.  OSA on nocturnal 2 L oxygen Continue supplemental oxygen at night.   Prediabetes A1c 5.9 in January 2023   Hypertension: Blood pressure currently stable. Hyperlipidemia Anxiety and depression Bipolar disorder Pharmacy med rec pending.   No Charge  Signed,  Tenny Craw, MD [facility]

## 2023-06-19 NOTE — Telephone Encounter (Signed)
Unable to get anyone on the line with [facility] for patient in regards to CPAP pressure settings. This will need to be taken care of by patients Pulmonologist per PCP.

## 2023-06-20 ENCOUNTER — Telehealth: Payer: Self-pay | Admitting: Pulmonary Disease

## 2023-06-20 DIAGNOSIS — K760 Fatty (change of) liver, not elsewhere classified: Secondary | ICD-10-CM | POA: Diagnosis present

## 2023-06-20 DIAGNOSIS — Z1152 Encounter for screening for COVID-19: Secondary | ICD-10-CM | POA: Diagnosis not present

## 2023-06-20 DIAGNOSIS — L03116 Cellulitis of left lower limb: Secondary | ICD-10-CM | POA: Diagnosis present

## 2023-06-20 DIAGNOSIS — Z887 Allergy status to serum and vaccine status: Secondary | ICD-10-CM | POA: Diagnosis not present

## 2023-06-20 DIAGNOSIS — I5032 Chronic diastolic (congestive) heart failure: Secondary | ICD-10-CM | POA: Diagnosis not present

## 2023-06-20 DIAGNOSIS — Z9981 Dependence on supplemental oxygen: Secondary | ICD-10-CM | POA: Diagnosis not present

## 2023-06-20 DIAGNOSIS — L03119 Cellulitis of unspecified part of limb: Secondary | ICD-10-CM

## 2023-06-20 DIAGNOSIS — Z23 Encounter for immunization: Secondary | ICD-10-CM | POA: Diagnosis not present

## 2023-06-20 DIAGNOSIS — J45909 Unspecified asthma, uncomplicated: Secondary | ICD-10-CM | POA: Diagnosis not present

## 2023-06-20 DIAGNOSIS — Z6841 Body Mass Index (BMI) 40.0 and over, adult: Secondary | ICD-10-CM | POA: Diagnosis not present

## 2023-06-20 DIAGNOSIS — G4733 Obstructive sleep apnea (adult) (pediatric): Secondary | ICD-10-CM | POA: Diagnosis present

## 2023-06-20 DIAGNOSIS — F319 Bipolar disorder, unspecified: Secondary | ICD-10-CM | POA: Diagnosis present

## 2023-06-20 DIAGNOSIS — Z91012 Allergy to eggs: Secondary | ICD-10-CM | POA: Diagnosis not present

## 2023-06-20 DIAGNOSIS — R7303 Prediabetes: Secondary | ICD-10-CM | POA: Diagnosis not present

## 2023-06-20 DIAGNOSIS — Z886 Allergy status to analgesic agent status: Secondary | ICD-10-CM | POA: Diagnosis not present

## 2023-06-20 DIAGNOSIS — Z79899 Other long term (current) drug therapy: Secondary | ICD-10-CM | POA: Diagnosis not present

## 2023-06-20 DIAGNOSIS — J9621 Acute and chronic respiratory failure with hypoxia: Secondary | ICD-10-CM | POA: Diagnosis present

## 2023-06-20 DIAGNOSIS — I10 Essential (primary) hypertension: Secondary | ICD-10-CM | POA: Diagnosis not present

## 2023-06-20 DIAGNOSIS — J4489 Other specified chronic obstructive pulmonary disease: Secondary | ICD-10-CM | POA: Diagnosis present

## 2023-06-20 DIAGNOSIS — E785 Hyperlipidemia, unspecified: Secondary | ICD-10-CM | POA: Diagnosis present

## 2023-06-20 DIAGNOSIS — F419 Anxiety disorder, unspecified: Secondary | ICD-10-CM | POA: Diagnosis present

## 2023-06-20 DIAGNOSIS — I700 Atherosclerosis of aorta: Secondary | ICD-10-CM | POA: Diagnosis present

## 2023-06-20 DIAGNOSIS — I110 Hypertensive heart disease with heart failure: Secondary | ICD-10-CM | POA: Diagnosis present

## 2023-06-20 DIAGNOSIS — R222 Localized swelling, mass and lump, trunk: Secondary | ICD-10-CM | POA: Diagnosis present

## 2023-06-20 DIAGNOSIS — I272 Pulmonary hypertension, unspecified: Secondary | ICD-10-CM | POA: Diagnosis present

## 2023-06-20 DIAGNOSIS — I890 Lymphedema, not elsewhere classified: Secondary | ICD-10-CM | POA: Diagnosis present

## 2023-06-20 LAB — BASIC METABOLIC PANEL
Anion gap: 8 (ref 5–15)
BUN: 22 mg/dL — ABNORMAL HIGH (ref 6–20)
CO2: 31 mmol/L (ref 22–32)
Calcium: 8.3 mg/dL — ABNORMAL LOW (ref 8.9–10.3)
Chloride: 96 mmol/L — ABNORMAL LOW (ref 98–111)
Creatinine, Ser: 0.7 mg/dL (ref 0.44–1.00)
GFR, Estimated: >60 mL/min (ref >60)
Glucose, Bld: 115 mg/dL — ABNORMAL HIGH (ref 70–99)
Potassium: 3.5 mmol/L (ref 3.5–5.1)
Sodium: 135 mmol/L (ref 135–145)

## 2023-06-20 LAB — CBC
HCT: 34.5 % — ABNORMAL LOW (ref 36.0–46.0)
Hemoglobin: 11 g/dL — ABNORMAL LOW (ref 12.0–15.0)
MCH: 29.6 pg (ref 26.0–34.0)
MCHC: 31.9 g/dL (ref 30.0–36.0)
MCV: 92.7 fL (ref 80.0–100.0)
Platelets: 341 10*3/uL (ref 150–400)
RBC: 3.72 MIL/uL — ABNORMAL LOW (ref 3.87–5.11)
RDW: 15.2 % (ref 11.5–15.5)
WBC: 8.9 10*3/uL (ref 4.0–10.5)
nRBC: 0 % (ref 0.0–0.2)

## 2023-06-20 LAB — MAGNESIUM: Magnesium: 2.2 mg/dL (ref 1.7–2.4)

## 2023-06-20 MED ORDER — PNEUMOCOCCAL 20-VAL CONJ VACC 0.5 ML IM SUSY
0.5000 mL | PREFILLED_SYRINGE | INTRAMUSCULAR | Status: AC
  Administered 2023-06-21: 0.5 mL via INTRAMUSCULAR
  Filled 2023-06-20: qty 0.5

## 2023-06-20 NOTE — Telephone Encounter (Signed)
Pt has [employer] and needs a new sleep study. With [facility] Medicaid they need a new one every 6 mons, pt last one was 1 year ago. So patient needs clinical notes before her sleep study, a new study & new prescription for CPAP w/ pressure settings

## 2023-06-20 NOTE — Plan of Care (Signed)
  Problem: Education: Goal: Knowledge of General Education information will improve Description: Including pain rating scale, medication(s)/side effects and non-pharmacologic comfort measures Outcome: Progressing   Problem: Safety: Goal: Ability to remain free from injury will improve Outcome: Progressing   Problem: Skin Integrity: Goal: Risk for impaired skin integrity will decrease Outcome: Progressing   Problem: Skin Integrity: Goal: Skin integrity will improve Outcome: Progressing

## 2023-06-20 NOTE — Progress Notes (Signed)
   06/20/23 1345  TOC Brief Assessment  Insurance and Status Reviewed  Patient has primary care physician Yes  Home environment has been reviewed Single family home  Prior level of function: Independent  Prior/Current Home Services No current home services  Social Determinants of Health Reivew SDOH reviewed no interventions necessary  Readmission risk has been reviewed Yes  Transition of care needs transition of care needs identified, TOC will continue to follow

## 2023-06-20 NOTE — Progress Notes (Signed)
PROGRESS NOTE    Christina Dominguez  ZOX:[id number] DOB: [birth year] DOA: 06/18/2023 PCP: Bary Leriche, PA-C    Brief Narrative:   Christina Dominguez is a 57 y.o. female with medical history significant of chronic HFpEF, hypertension, hyperlipidemia, pulmonary hypertension, asthma, chronic bronchitis, lymphedema, severe morbid obesity, OSA, anxiety, depression, bipolar disorder, prediabetes, chronic left lower extremity wound who was recently admitted 10/29-11/2 for left lower extremity cellulitis and was discharged on cefadroxil x 10 days.  Left lower extremity Doppler ultrasound done during this admission was negative for DVT.  Patient presents at this time with increasing drainage from the wound with shortness of breath.  She had desaturation with pulse ox of 80% on ambulation and was put on 2 L of nasal cannula on presentation.  Labs with negative.  BNP normal.  CTA chest without PE.  Possible small airway disease.  X-ray of left tibia/fibula negative for acute osseous abnormality. Patient was given IV Lasix 40 mg, Norco, clindamycin, and DuoNeb treatment in the ED.   Assessment and Plan   Left lower extremity cellulitis/wound Failed outpatient treatment.  No signs of sepsis.  X-ray negative for osteomyelitis.  On vancomycin.  Avoiding clindamycin.  Slightly improving.  Patient requesting wound care consultation.   Left lower extremity edema/chronic lymphedema 4+ pitting edema of left lower extremity and Doppler ultrasound done during recent admission was negative for DVT.  Continue IV Lasix 40 mg twice daily.   Chronic HFpEF Last  2 D echo done in January 2023 showing EF 55 to 60%, grade 1 diastolic dysfunction, normal RV systolic function.  BNP normal in the setting of super morbid obesity.  Hypertensive pulmonary edema.  Continue IV Lasix   Acute hypoxemic respiratory failure Chronic asthma  Not hypoxic at rest but desatted to low 80s with ambulation.  Currently stable on 2 L .   No signs of respiratory distress.  Continue DuoNeb as needed.  Hold off with steroids for now.  Will continue to wean oxygen as able.  OSA on nocturnal 2 L oxygen Continue supplemental oxygen at night.   Prediabetes A1c 5.9 in January 2023   Hypertension: Blood pressure currently stable. Hyperlipidemia Anxiety and depression Bipolar disorder   DVT prophylaxis: Lovenox subcu   Code Status:     Code Status: Full Code  Disposition: Likely home with home health Status is: Observation   Family Communication: None at bedside  Consultants:  None  Procedures:  None  Antimicrobials:  Vancomycin IV  Anti-infectives (From admission, onward)    Start     Dose/Rate Route Frequency Ordered Stop   06/20/23 0500  vancomycin (VANCOREADY) IVPB 2000 mg/400 mL  Status:  Discontinued        2,000 mg 200 mL/hr over 120 Minutes Intravenous Every 24 hours 06/19/23 0714 06/19/23 1921   06/20/23 0500  vancomycin (VANCOCIN) IVPB 1000 mg/200 mL premix       Placed in "And" Linked Group   1,000 mg 200 mL/hr over 60 Minutes Intravenous Every 24 hours 06/19/23 1924     06/20/23 0500  vancomycin (VANCOCIN) IVPB 1000 mg/200 mL premix       Placed in "And" Linked Group   1,000 mg 200 mL/hr over 60 Minutes Intravenous Every 24 hours 06/19/23 1924     06/20/23 0400  vancomycin (VANCOCIN) 2,250 mg in sodium chloride 0.9 % 500 mL IVPB  Status:  Discontinued        2,250 mg 261.3 mL/hr over 120 Minutes Intravenous Every 24 hours  06/19/23 0342 06/19/23 0714   06/19/23 0430  vancomycin (VANCOREADY) IVPB 1250 mg/250 mL        1,250 mg 166.7 mL/hr over 90 Minutes Intravenous  Once 06/19/23 0341 06/19/23 0605   06/19/23 0345  vancomycin (VANCOCIN) IVPB 1000 mg/200 mL premix        1,000 mg 200 mL/hr over 60 Minutes Intravenous  Once 06/19/23 0251 06/19/23 0433   06/18/23 2215  clindamycin (CLEOCIN) IVPB 600 mg        600 mg 100 mL/hr over 30 Minutes Intravenous  Once 06/18/23 2202 06/18/23 2336         Subjective: Today, patient was seen and examined at bedside.  Patient states that drainage has improved but he still has redness swelling.  Has dyspnea on exertion.  Objective: Vitals:   06/19/23 1007 06/19/23 1529 06/19/23 1931 06/20/23 0522  BP: (!) 152/89 127/89 (!) 155/74 135/61  Pulse: 81 80 90 85  Resp: 18 18 20 20   Temp: 97.8 F (36.6 C) 98.4 F (36.9 C) 98.4 F (36.9 C) 97.8 F (36.6 C)  TempSrc:  Oral Oral Oral  SpO2: 99% 97% 95% 97%  Weight:      Height:        Intake/Output Summary (Last 24 hours) at 06/20/2023 1249 Last data filed at 06/20/2023 0600 Gross per 24 hour  Intake 64.86 ml  Output --  Net 64.86 ml   Filed Weights   06/18/23 1550  Weight: (!) 181.4 kg    Physical Examination:   Body Mass Index: 78.12 kg/m    General: Morbid obesity, not in obvious distress HENT:   No scleral pallor or icterus noted. Oral mucosa is moist.  Chest:    Diminished breath sounds bilaterally. No crackles or wheezes.  CVS: S1 &S2 heard. No murmur.  Regular rate and rhythm. Abdomen: Soft, nontender, nondistended.  Bowel sounds are heard.   Extremities: No cyanosis, clubbing bilateral lower extremity pitting edema, left lower leg drainage  Psych: Alert, awake and oriented, normal mood CNS:  No cranial nerve deficits.  Moves all extremities. Skin: Warm and dry.    Data Reviewed:   CBC: Recent Labs  Lab 06/18/23 1654 06/19/23 0505 06/20/23 0531  WBC 10.6* 9.6 8.9  NEUTROABS 7.8*  --   --   HGB 12.3 10.9* 11.0*  HCT 38.3 34.8* 34.5*  MCV 90.8 93.8 92.7  PLT 420* 359 341    Basic Metabolic Panel: Recent Labs  Lab 06/18/23 1654 06/19/23 0505 06/20/23 0531  NA 137 137 135  K 4.3 3.8 3.5  CL 96* 96* 96*  CO2 29 32 31  GLUCOSE 93 92 115*  BUN 15 18 22*  CREATININE 0.79 0.93 0.70  CALCIUM 8.8* 8.3* 8.3*  MG  --   --  2.2    Liver Function Tests: No results for input(s): "AST", "ALT", "ALKPHOS", "BILITOT", "PROT", "ALBUMIN" in the last 168  hours.   Radiology Studies: CT Angio Chest PE W and/or Wo Contrast  Result Date: 06/18/2023 CLINICAL DATA:  Chronic left lower extremity wound shortness of breath EXAM: CT ANGIOGRAPHY CHEST WITH CONTRAST TECHNIQUE: Multidetector CT imaging of the chest was performed using the standard protocol during bolus administration of intravenous contrast. Multiplanar CT image reconstructions and MIPs were obtained to evaluate the vascular anatomy. RADIATION DOSE REDUCTION: This exam was performed according to the departmental dose-optimization program which includes automated exposure control, adjustment of the mA and/or kV according to patient size and/or use of iterative reconstruction technique. CONTRAST:  OMNIPAQUE IOHEXOL 350 MG/ML SOLN COMPARISON:  Chest x-ray 06/18/2023, chest CT 10/03/2021, MRI 10/05/2021 FINDINGS: Cardiovascular: Satisfactory opacification of the pulmonary arteries to the segmental level. No evidence of pulmonary embolism. Dilated pulmonary trunk measuring up to 4.3 cm consistent with arterial hypertension. Nonaneurysmal aorta without dissection. Mild cardiomegaly. No pericardial effusion. Mediastinum/Nodes: Midline trachea. No thyroid mass. No suspicious lymph nodes. 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 Esophagus unremarkable Lungs/Pleura: Bilateral mosaic attenuation. No acute consolidation, pleural effusion or pneumothorax. Upper Abdomen: No acute finding Musculoskeletal: No acute osseous abnormality Review of the MIP images confirms the above findings. IMPRESSION: 1. Negative for acute pulmonary embolus or aortic dissection. 2. Dilated pulmonary trunk consistent with arterial hypertension. Mild cardiomegaly. 3. Bilateral mosaic attenuation suggesting small airways disease. 4. 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 Electronically Signed   By: Jasmine Pang M.D.   On: 06/18/2023 22:15   DG Tibia/Fibula Left  Result Date: 06/18/2023 CLINICAL DATA:  Wound to lower part of leg EXAM: LEFT TIBIA AND FIBULA - 2 VIEW COMPARISON:  11/26/2021 FINDINGS: No fracture or malalignment. No periostitis or osseous destructive change. Advanced arthropathy at the knee. Generalized soft tissue edema. Large posterior calcaneal enthesophyte IMPRESSION: No acute osseous abnormality Electronically Signed   By: Jasmine Pang M.D.   On: 06/18/2023 19:17   DG Chest 2 View  Result Date: 06/18/2023 CLINICAL DATA:  Shortness of breath EXAM: CHEST - 2 VIEW COMPARISON:  Chest x-ray 06/04/2023 FINDINGS: The heart size and mediastinal contours are within normal limits. Both lungs are clear. The visualized skeletal structures are unremarkable. IMPRESSION: No active cardiopulmonary disease. Electronically Signed   By: Jasmine Pang M.D.   On: 06/18/2023 19:15      LOS: 1 day     Joycelyn Das, MD [facility] Available via Epic secure chat 7am-7pm After these hours, please refer to coverage provider listed on [email or web address] 06/20/2023, 12:49 PM

## 2023-06-21 DIAGNOSIS — L03119 Cellulitis of unspecified part of limb: Secondary | ICD-10-CM | POA: Diagnosis not present

## 2023-06-21 DIAGNOSIS — L03116 Cellulitis of left lower limb: Secondary | ICD-10-CM | POA: Diagnosis not present

## 2023-06-21 DIAGNOSIS — J45909 Unspecified asthma, uncomplicated: Secondary | ICD-10-CM | POA: Diagnosis not present

## 2023-06-21 DIAGNOSIS — I5032 Chronic diastolic (congestive) heart failure: Secondary | ICD-10-CM | POA: Diagnosis not present

## 2023-06-21 LAB — CBC
HCT: 37 % (ref 36.0–46.0)
Hemoglobin: 11.7 g/dL — ABNORMAL LOW (ref 12.0–15.0)
MCH: 28.9 pg (ref 26.0–34.0)
MCHC: 31.6 g/dL (ref 30.0–36.0)
MCV: 91.4 fL (ref 80.0–100.0)
Platelets: 367 10*3/uL (ref 150–400)
RBC: 4.05 MIL/uL (ref 3.87–5.11)
RDW: 15.1 % (ref 11.5–15.5)
WBC: 9.6 10*3/uL (ref 4.0–10.5)
nRBC: 0 % (ref 0.0–0.2)

## 2023-06-21 LAB — BASIC METABOLIC PANEL
Anion gap: 8 (ref 5–15)
BUN: 24 mg/dL — ABNORMAL HIGH (ref 6–20)
CO2: 31 mmol/L (ref 22–32)
Calcium: 8.5 mg/dL — ABNORMAL LOW (ref 8.9–10.3)
Chloride: 95 mmol/L — ABNORMAL LOW (ref 98–111)
Creatinine, Ser: 0.67 mg/dL (ref 0.44–1.00)
GFR, Estimated: >60 mL/min (ref >60)
Glucose, Bld: 104 mg/dL — ABNORMAL HIGH (ref 70–99)
Potassium: 3.5 mmol/L (ref 3.5–5.1)
Sodium: 134 mmol/L — ABNORMAL LOW (ref 135–145)

## 2023-06-21 LAB — MAGNESIUM: Magnesium: 2.1 mg/dL (ref 1.7–2.4)

## 2023-06-21 MED ORDER — DOXYCYCLINE HYCLATE 100 MG PO TABS: 100.0000 mg | ORAL_TABLET | Freq: Two times a day (BID) | ORAL | 0 refills | Status: AC

## 2023-06-21 NOTE — Discharge Summary (Signed)
Physician Discharge Summary  Christina Dominguez:[id number] DOB: 05-04-1966 DOA: 06/18/2023  PCP: Bary Leriche, PA-C  Admit date: 06/18/2023 Discharge date: 06/21/2023  Admitted From: Home  Discharge disposition: Home   Recommendations for Outpatient Follow-Up:   Follow up with your primary care provider in one week.  Check CBC, BMP, magnesium in the next visit Follow-up with pulmonary, cardiology as has been scheduled.  Patient will need sleep study as outpatient.   Discharge Diagnosis:   Principal Problem:   Cellulitis of lower extremity Active Problems:   Chronic diastolic CHF (congestive heart failure) (HCC)   Essential hypertension   Prediabetes   Asthma   Cellulitis   Left leg cellulitis    Discharge Condition: Improved.  Diet recommendation: Low sodium, heart healthy.    Wound care: See below  Code status: Full.   History of Present Illness:   Christina Dominguez is a 57 y.o. female with medical history significant of chronic HFpEF, hypertension, hyperlipidemia, pulmonary hypertension, asthma, chronic bronchitis, lymphedema, severe morbid obesity, OSA, anxiety, depression, bipolar disorder, prediabetes, chronic left lower extremity wound who was recently admitted 10/29-11/2 for left lower extremity cellulitis and was discharged on cefadroxil x 10 days.  Left lower extremity Doppler ultrasound done during previous admission was negative for DVT.  Patient presented this time with increasing drainage from the wound with shortness of breath.  She had desaturation with pulse ox of 80% on ambulation and was put on 2 L of nasal cannula on presentation.  Labs largely negative.  BNP normal.  CTA chest without PE.  Possible small airway disease.  X-ray of left tibia/fibula negative for acute osseous abnormality. Patient was given IV Lasix 40 mg, Norco, clindamycin, and DuoNeb treatment in the ED and was admitted hospital for further evaluation and treatment.Marland Kitchen     Hospital Course:   Following conditions were addressed during hospitalization as listed below,  Left lower extremity cellulitis/wound Failed outpatient treatment.  No signs of sepsis.  X-ray of the lower extremity negative for osteomyelitis.  Patient received IV vancomycin.  Avoiding clindamycin.  Medically improved at this time with less drainage.  Will switch to oral doxycycline for next 5 days on discharge.    Left lower extremity edema/chronic lymphedema 4+ pitting edema of left lower extremity and Doppler ultrasound done during recent admission was negative for DVT.  Will resume diuretics from home on discharge.  Received IV Lasix during hospitalization.   Chronic HFpEF Last  2 D echo done in January 2023 showing EF 55 to 60%, grade 1 diastolic dysfunction, normal RV systolic function.  BNP normal in the setting of super morbid obesity.  Received IV diuretic.  Currently compensated and improved.    Acute hypoxemic respiratory failure Chronic asthma Improved at this time.  On room air.  Uses oxygen 2 L/min at nighttime.  Will need oxygen to be restarted on discharge.   OSA on nocturnal 2 L oxygen Continue supplemental oxygen at night.   Prediabetes A1c 5.9 in January 2023   Hypertension: Blood pressure currently stable. Hyperlipidemia Anxiety and depression Bipolar disorder Not on medications.  Disposition.  At this time, patient is stable for disposition home with outpatient PCP, pulmonary and cardiology follow-up.  Patient does have a scheduled appointment with Dr Lajoyce Corners orthopedics, pulmonary and cardiology as outpatient.  Medical Consultants:   None.  Procedures:    None Subjective:   Today, patient seen and examined at bedside.  Feels okay with breathing.  Discharge Exam:   Vitals:  06/20/23 1955 06/21/23 0605  BP: (!) 158/72 (!) 140/78  Pulse: 83 70  Resp: 18 18  Temp: 98 F (36.7 C) 97.9 F (36.6 C)  SpO2: 95% 100%   Vitals:   06/20/23 0522  06/20/23 1500 06/20/23 1955 06/21/23 0605  BP: 135/61 (!) 154/104 (!) 158/72 (!) 140/78  Pulse: 85 77 83 70  Resp: 20 19 18 18   Temp: 97.8 F (36.6 C) 97.8 F (36.6 C) 98 F (36.7 C) 97.9 F (36.6 C)  TempSrc: Oral Oral    SpO2: 97% 98% 95% 100%  Weight:      Height:       Body mass index is 78.12 kg/m.  General: Alert awake, not in obvious distress, morbidly obese HENT: pupils equally reacting to light,  No scleral pallor or icterus noted. Oral mucosa is moist.  Chest:  Diminished breath sounds bilaterally.  CVS: S1 &S2 heard. No murmur.  Regular rate and rhythm. Abdomen: Soft, nontender, nondistended.  Bowel sounds are heard.   Extremities: No cyanosis, clubbing bilateral lower extremity pitting edema, left lower leg with mild ulceration Psych: Alert, awake and oriented, normal mood CNS:  No cranial nerve deficits.  Power equal in all extremities.   Skin: Warm and dry.  Left lower leg with dark pigmentation, ulceration  The results of significant diagnostics from this hospitalization (including imaging, microbiology, ancillary and laboratory) are listed below for reference.     Diagnostic Studies:   CT Angio Chest PE W and/or Wo Contrast  Result Date: 06/18/2023 CLINICAL DATA:  Chronic left lower extremity wound shortness of breath EXAM: CT ANGIOGRAPHY CHEST WITH CONTRAST TECHNIQUE: Multidetector CT imaging of the chest was performed using the standard protocol during bolus administration of intravenous contrast. Multiplanar CT image reconstructions and MIPs were obtained to evaluate the vascular anatomy. RADIATION DOSE REDUCTION: This exam was performed according to the departmental dose-optimization program which includes automated exposure control, adjustment of the mA and/or kV according to patient size and/or use of iterative reconstruction technique. CONTRAST:  OMNIPAQUE IOHEXOL 350 MG/ML SOLN COMPARISON:  Chest x-ray 06/18/2023, chest CT 10/03/2021, MRI 10/05/2021  FINDINGS: Cardiovascular: Satisfactory opacification of the pulmonary arteries to the segmental level. No evidence of pulmonary embolism. Dilated pulmonary trunk measuring up to 4.3 cm consistent with arterial hypertension. Nonaneurysmal aorta without dissection. Mild cardiomegaly. No pericardial effusion. Mediastinum/Nodes: Midline trachea. No thyroid mass. No suspicious lymph nodes. 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 Esophagus unremarkable Lungs/Pleura: Bilateral mosaic attenuation. No acute consolidation, pleural effusion or pneumothorax. Upper Abdomen: No acute finding Musculoskeletal: No acute osseous abnormality Review of the MIP images confirms the above findings. IMPRESSION: 1. Negative for acute pulmonary embolus or aortic dissection. 2. Dilated pulmonary trunk consistent with arterial hypertension. Mild cardiomegaly. 3. Bilateral mosaic attenuation suggesting small airways disease. 4. 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 Electronically Signed   By: Jasmine Pang M.D.   On: 06/18/2023 22:15   DG Tibia/Fibula Left  Result Date: 06/18/2023 CLINICAL DATA:  Wound to lower part of leg EXAM: LEFT TIBIA AND FIBULA - 2 VIEW COMPARISON:  11/26/2021 FINDINGS: No fracture or malalignment. No periostitis or osseous destructive change. Advanced arthropathy at the knee. Generalized soft tissue edema. Large posterior calcaneal enthesophyte IMPRESSION: No acute osseous abnormality Electronically Signed   By: Adrian Prows.D.  On: 06/18/2023 19:17   DG Chest 2 View  Result Date: 06/18/2023 CLINICAL DATA:  Shortness of breath EXAM: CHEST - 2 VIEW COMPARISON:  Chest x-ray 06/04/2023 FINDINGS: The heart size and mediastinal contours are within normal limits. Both lungs are clear. The visualized skeletal  structures are unremarkable. IMPRESSION: No active cardiopulmonary disease. Electronically Signed   By: Jasmine Pang M.D.   On: 06/18/2023 19:15     Labs:   Basic Metabolic Panel: Recent Labs  Lab 06/18/23 1654 06/19/23 0505 06/20/23 0531 06/21/23 0521  NA 137 137 135 134*  K 4.3 3.8 3.5 3.5  CL 96* 96* 96* 95*  CO2 29 32 31 31  GLUCOSE 93 92 115* 104*  BUN 15 18 22* 24*  CREATININE 0.79 0.93 0.70 0.67  CALCIUM 8.8* 8.3* 8.3* 8.5*  MG  --   --  2.2 2.1   GFR Estimated Creatinine Clearance: 120.9 mL/min (by C-G formula based on SCr of 0.67 mg/dL). Liver Function Tests: No results for input(s): "AST", "ALT", "ALKPHOS", "BILITOT", "PROT", "ALBUMIN" in the last 168 hours. No results for input(s): "LIPASE", "AMYLASE" in the last 168 hours. No results for input(s): "AMMONIA" in the last 168 hours. Coagulation profile No results for input(s): "INR", "PROTIME" in the last 168 hours.  CBC: Recent Labs  Lab 06/18/23 1654 06/19/23 0505 06/20/23 0531 06/21/23 0521  WBC 10.6* 9.6 8.9 9.6  NEUTROABS 7.8*  --   --   --   HGB 12.3 10.9* 11.0* 11.7*  HCT 38.3 34.8* 34.5* 37.0  MCV 90.8 93.8 92.7 91.4  PLT 420* 359 341 367   Cardiac Enzymes: No results for input(s): "CKTOTAL", "CKMB", "CKMBINDEX", "TROPONINI" in the last 168 hours. BNP: Invalid input(s): "POCBNP" CBG: No results for input(s): "GLUCAP" in the last 168 hours. D-Dimer No results for input(s): "DDIMER" in the last 72 hours. Hgb A1c Recent Labs    06/19/23 0505  HGBA1C 5.8*   Lipid Profile No results for input(s): "CHOL", "HDL", "LDLCALC", "TRIG", "CHOLHDL", "LDLDIRECT" in the last 72 hours. Thyroid function studies No results for input(s): "TSH", "T4TOTAL", "T3FREE", "THYROIDAB" in the last 72 hours.  Invalid input(s): "FREET3" Anemia work up No results for input(s): "VITAMINB12", "FOLATE", "FERRITIN", "TIBC", "IRON", "RETICCTPCT" in the last 72 hours. Microbiology Recent Results (from the past 240  hour(s))  Resp panel by RT-PCR (RSV, Flu A&B, Covid) Anterior Nasal Swab     Status: None   Collection Time: 06/18/23 11:12 PM   Specimen: Anterior Nasal Swab  Result Value Ref Range Status   SARS Coronavirus 2 by RT PCR NEGATIVE NEGATIVE Final    Comment: (NOTE) SARS-CoV-2 target nucleic acids are NOT DETECTED.  The SARS-CoV-2 RNA is generally detectable in upper respiratory specimens during the acute phase of infection. The lowest concentration of SARS-CoV-2 viral copies this assay can detect is 138 copies/mL. A negative result does not preclude SARS-Cov-2 infection and should not be used as the sole basis for treatment or other patient management decisions. A negative result may occur with  improper specimen collection/handling, submission of specimen other than nasopharyngeal swab, presence of viral mutation(s) within the areas targeted by this assay, and inadequate number of viral copies(<138 copies/mL). A negative result must be combined with clinical observations, patient history, and epidemiological information. The expected result is Negative.  Fact Sheet for Patients:  BloggerCourse.com  Fact Sheet for Healthcare Providers:  SeriousBroker.it  This test is no t yet approved or cleared by the [place] and  has been authorized  for detection and/or diagnosis of SARS-CoV-2 by FDA under an Emergency Use Authorization (EUA). This EUA will remain  in effect (meaning this test can be used) for the duration of the COVID-19 declaration under Section 564(b)(1) of the Act, 21 U.S.C.section 360bbb-3(b)(1), unless the authorization is terminated  or revoked sooner.       Influenza A by PCR NEGATIVE NEGATIVE Final   Influenza B by PCR NEGATIVE NEGATIVE Final    Comment: (NOTE) The Xpert Xpress SARS-CoV-2/FLU/RSV plus assay is intended as an aid in the diagnosis of influenza from Nasopharyngeal swab specimens and should not  be used as a sole basis for treatment. Nasal washings and aspirates are unacceptable for Xpert Xpress SARS-CoV-2/FLU/RSV testing.  Fact Sheet for Patients: BloggerCourse.com  Fact Sheet for Healthcare Providers: SeriousBroker.it  This test is not yet approved or cleared by the [place] FDA and has been authorized for detection and/or diagnosis of SARS-CoV-2 by FDA under an Emergency Use Authorization (EUA). This EUA will remain in effect (meaning this test can be used) for the duration of the COVID-19 declaration under Section 564(b)(1) of the Act, 21 U.S.C. section 360bbb-3(b)(1), unless the authorization is terminated or revoked.     Resp Syncytial Virus by PCR NEGATIVE NEGATIVE Final    Comment: (NOTE) Fact Sheet for Patients: BloggerCourse.com  Fact Sheet for Healthcare Providers: SeriousBroker.it  This test is not yet approved or cleared by the [place] FDA and has been authorized for detection and/or diagnosis of SARS-CoV-2 by FDA under an Emergency Use Authorization (EUA). This EUA will remain in effect (meaning this test can be used) for the duration of the COVID-19 declaration under Section 564(b)(1) of the Act, 21 U.S.C. section 360bbb-3(b)(1), unless the authorization is terminated or revoked.  Performed at [facility], [address] [place], [place] [address]      Discharge Instructions:   Discharge Instructions     Call MD for:  severe uncontrolled pain   Complete by: As directed    Call MD for:  temperature >100.4   Complete by: As directed    Diet - low sodium heart healthy   Complete by: As directed    Discharge instructions   Complete by: As directed    Continue antibiotics as prescribed.  Follow-up with primary care provider in 1 week.  Check blood work at that time.  Seek medical attention for worsening symptoms.  Follow-up  with Dr Lajoyce Corners orthopedics wound care as outpatient.  Follow-up with cardiology and pulmonary as outpatient (keep the scheduled appointment) and to obtain sleep study.Marland Kitchen  Keep the affected legs elevated.   Discharge wound care:   Complete by: As directed    Aquacel #[id number] change daily   Increase activity slowly   Complete by: As directed       Allergies as of 06/21/2023       Reactions   Aspirin Nausea And Vomiting   Egg-derived Products Swelling   Influenza Vaccines Swelling        Medication List     TAKE these medications    acetaminophen 650 MG CR tablet Commonly known as: TYLENOL Take 1,300 mg by mouth every 8 (eight) hours as needed for pain.   albuterol 108 (90 Base) MCG/ACT inhaler Commonly known as: VENTOLIN HFA Inhale 2 puffs into the lungs every 6 (six) hours as needed for wheezing or shortness of breath.   dicyclomine 10 MG capsule Commonly known as: BENTYL Take 1 capsule (10 mg total) by mouth  4 (four) times daily -  before meals and at bedtime. What changed:  how much to take when to take this reasons to take this   diphenhydrAMINE 25 MG tablet Commonly known as: BENADRYL Take 50 mg by mouth at bedtime as needed for sleep.   doxycycline 100 MG tablet Commonly known as: VIBRA-TABS Take 1 tablet (100 mg total) by mouth 2 (two) times daily for 5 days.   furosemide 80 MG tablet Commonly known as: LASIX TAKE 1 TABLET(80 MG) BY MOUTH DAILY   valsartan 40 MG tablet Commonly known as: Diovan Take 1 tablet (40 mg total) by mouth daily.   ZZZQUIL PO Take 3 tablets by mouth at bedtime.               Discharge Care Instructions  (From admission, onward)           Start     Ordered   06/21/23 0000  Discharge wound care:       Comments: Aquacel #[id number] change daily   06/21/23 0939            Follow-up Information     Allwardt, Alyssa M, PA-C Follow up in 1 week(s).   Specialty: Physician Assistant Contact information: [address] [place] [address] [phone]                  Time coordinating discharge: 39 minutes  Signed:  Katriana Dortch  [facility] 06/21/2023, 9:39 AM

## 2023-06-21 NOTE — TOC Transition Note (Addendum)
Transition of Care [facility]) - CM/SW Discharge Note   Patient Details  Name: ALAIIA TAMBURINO MRN: [id number] Date of Birth: 1966-03-17  Transition of Care [facility]) CM/SW Contact:  Otelia Santee, LCSW Phone Number: 06/21/2023, 9:45 AM   Clinical Narrative:    Pt has home O2 concentrator through [employer] for nightly O2 use. Pt reports her concentrator has not been working. Spoke with Mitch with [employer] who has a service order in place to have pt's concentrator fixed or replaced today.   ADDENDUM: Referral to placed for outpatient wound care center.   Final next level of care: Home/Self Care     Patient Goals and CMS Choice      Discharge Placement                         Discharge Plan and Services Additional resources added to the After Visit Summary for                                       Social Determinants of Health (SDOH) Interventions SDOH Screenings   Food Insecurity: No Food Insecurity (06/19/2023)  Housing: Patient Declined (06/19/2023)  Transportation Needs: No Transportation Needs (06/19/2023)  Utilities: Not At Risk (06/19/2023)  Depression (PHQ2-9): High Risk (08/30/2021)  Financial Resource Strain: Medium Risk (10/05/2021)  Tobacco Use: Low Risk  (06/18/2023)     Readmission Risk Interventions    06/20/2023    1:45 PM  Readmission Risk Prevention Plan  Transportation Screening Complete  PCP or Specialist Appt within 5-7 Days Complete  Home Care Screening Complete  Medication Review (RN CM) Complete

## 2023-06-21 NOTE — Plan of Care (Signed)
  Problem: Education: Goal: Knowledge of General Education information will improve Description: Including pain rating scale, medication(s)/side effects and non-pharmacologic comfort measures Outcome: Progressing   Problem: Health Behavior/Discharge Planning: Goal: Ability to manage health-related needs will improve Outcome: Progressing   Problem: Clinical Measurements: Goal: Ability to maintain clinical measurements within normal limits will improve Outcome: Progressing Goal: Will remain free from infection Outcome: Progressing Goal: Diagnostic test results will improve Outcome: Progressing Goal: Respiratory complications will improve Outcome: Progressing Goal: Cardiovascular complication will be avoided Outcome: Progressing   Problem: Activity: Goal: Risk for activity intolerance will decrease Outcome: Progressing   Problem: Nutrition: Goal: Adequate nutrition will be maintained Outcome: Progressing   Problem: Coping: Goal: Level of anxiety will decrease Outcome: Progressing   Problem: Elimination: Goal: Will not experience complications related to bowel motility Outcome: Progressing Goal: Will not experience complications related to urinary retention Outcome: Progressing   Problem: Pain Management: Goal: General experience of comfort will improve Outcome: Progressing   Problem: Safety: Goal: Ability to remain free from injury will improve Outcome: Progressing   Problem: Skin Integrity: Goal: Risk for impaired skin integrity will decrease Outcome: Progressing   Problem: Clinical Measurements: Goal: Ability to avoid or minimize complications of infection will improve Outcome: Progressing   Problem: Skin Integrity: Goal: Skin integrity will improve Outcome: Progressing

## 2023-06-21 NOTE — Progress Notes (Signed)
Pharmacy Antibiotic Note  Christina Dominguez is a 57 y.o. female with hx lymphedema who was recently hospitalized from 06/04/23 to 06/08/23 for LLE cellulitis. She was treated with ceftriaxone inpatient and discharged on cefadroxil for 8 days. She presented back to the ED on 06/18/2023 with c/o SOB and drainage from LLE wound. She's currently on vancomycin for infection.  Today, 06/21/2023: - Day #3 abx - scr 0.67 (crcl~100) - wbc wnl  - afeb - no cultures collected with this adm  Plan: - continue vancomycin dose to 2000 mg IV q24h for est AUC 505  _______________________________________________  Height: 5' (152.4 cm) Weight: (!) 181.4 kg (400 lb) IBW/kg (Calculated) : 45.5  Temp (24hrs), Avg:97.9 F (36.6 C), Min:97.8 F (36.6 C), Max:98 F (36.7 C)  Recent Labs  Lab 06/18/23 1654 06/19/23 0505 06/20/23 0531 06/21/23 0521  WBC 10.6* 9.6 8.9 9.6  CREATININE 0.79 0.93 0.70 0.67  LATICACIDVEN 1.0  --   --   --     Estimated Creatinine Clearance: 120.9 mL/min (by C-G formula based on SCr of 0.67 mg/dL).    Allergies  Allergen Reactions   Aspirin Nausea And Vomiting   Egg-Derived Products Swelling   Influenza Vaccines Swelling     Thank you for allowing pharmacy to be a part of this patient's care.  Lucia Gaskins 06/21/2023 7:28 AM

## 2023-06-25 ENCOUNTER — Ambulatory Visit: Payer: Medicaid Other | Admitting: Family

## 2023-06-26 ENCOUNTER — Ambulatory Visit: Payer: Medicaid Other | Admitting: Physical Therapy

## 2023-06-27 ENCOUNTER — Ambulatory Visit (INDEPENDENT_AMBULATORY_CARE_PROVIDER_SITE_OTHER): Payer: Medicaid Other | Admitting: Family

## 2023-06-27 ENCOUNTER — Encounter: Payer: Self-pay | Admitting: Family

## 2023-06-27 ENCOUNTER — Other Ambulatory Visit (INDEPENDENT_AMBULATORY_CARE_PROVIDER_SITE_OTHER): Payer: Medicaid Other

## 2023-06-27 DIAGNOSIS — M5416 Radiculopathy, lumbar region: Secondary | ICD-10-CM

## 2023-06-27 DIAGNOSIS — L03116 Cellulitis of left lower limb: Secondary | ICD-10-CM

## 2023-06-27 MED ORDER — GABAPENTIN 100 MG PO CAPS
100.0000 mg | ORAL_CAPSULE | Freq: Three times a day (TID) | ORAL | 1 refills | Status: DC
Start: 2023-06-27 — End: 2023-10-10

## 2023-06-27 NOTE — Progress Notes (Signed)
Office Visit Note   Patient: Christina Dominguez           Date of Birth: 15-Aug-1965           MRN: [id number] Visit Date: 06/27/2023              Requested by: Allwardt, Crist Infante, PA-C [address] [place],  [place] [address] PCP: Bary Leriche, PA-C  Chief Complaint  Patient presents with   Left Leg - Follow-up    06/18/2023 ER visit       HPI: The patient is a 57 year old woman who presents today for 2 separate issues  She is status post recent hospitalization for cellulitis left lower extremity.  At the time she did have some drainage from her leg she reports that the drainage has resolved the wound is no longer open she has completed course of vancomycin and then 5 days of doxycycline p.o.  Since hospitalization she reports difficulty with mobility some due to deconditioning and weakness.  She also has been having difficulty with shooting pain in her right low back hip buttock and posterior thigh this is new.  Worse with standing and walking.  She has some relief of that pain with sitting or lying  No history of back pain.  No red flag symptoms.  Assessment & Plan: Visit Diagnoses:  1. Radiculopathy, lumbar region     Plan: Reassurance provided pleased with resolution cellulitis left lower extremity.  We will follow her lumbar radiculopathy right sided.  She was offered prednisone and declined.  Will trial her on some gabapentin for her neuropathic pain.  Discussed possibility of epidural steroid injection in the future.  Follow-Up Instructions: Return in about 4 weeks (around 07/25/2023), or if symptoms worsen or fail to improve.   Back Exam   Muscle Strength  The patient has normal back strength.  Tests  Straight leg raise right: negative      Patient is alert, oriented, no adenopathy, well-dressed, normal affect, normal respiratory effort. On examination left lower extremity she does have some hemosiderin staining.  The cellulitis has resolved there  is trace edema without erythema or warmth no open area of the skin  Imaging: No results found. No images are attached to the encounter.  Labs: Lab Results  Component Value Date   HGBA1C 5.8 (H) 06/19/2023   HGBA1C 5.9 (H) 08/20/2021   HGBA1C 6.0 (H) 03/22/2020   CRP 2.4 02/16/2022   CRP 1.6 (H) 10/09/2021   REPTSTATUS 06/09/2023 FINAL 06/04/2023   GRAMSTAIN NO WBC SEEN NO ORGANISMS SEEN  12/20/2021   CULT  06/04/2023    NO GROWTH 5 DAYS Performed at [facility], [address], [place], [place] [address]      Lab Results  Component Value Date   ALBUMIN 2.7 (L) 06/06/2023   ALBUMIN 3.5 06/04/2023   ALBUMIN 4.3 03/20/2022    Lab Results  Component Value Date   MG 2.1 06/21/2023   MG 2.2 06/20/2023   Lab Results  Component Value Date   VD25OH 33.5 03/22/2020   VD25OH 28.6 (L) 12/21/2019    No results found for: "PREALBUMIN"    Latest Ref Rng & Units 06/21/2023    5:21 AM 06/20/2023    5:31 AM 06/19/2023    5:05 AM  CBC EXTENDED  WBC 4.0 - 10.5 K/uL 9.6  8.9  9.6   RBC 3.87 - 5.11 MIL/uL 4.05  3.72  3.71   Hemoglobin 12.0 - 15.0 g/dL  11.7  11.0  10.9   HCT 36.0 - 46.0 % 37.0  34.5  34.8   Platelets 150 - 400 K/uL 367  341  359      There is no height or weight on file to calculate BMI.  Orders:  Orders Placed This Encounter  Procedures   XR Lumbar Spine 2-3 Views   No orders of the defined types were placed in this encounter.    Procedures: No procedures performed  Clinical Data: No additional findings.  ROS:  All other systems negative, except as noted in the HPI. Review of Systems  Objective: Vital Signs: There were no vitals taken for this visit.  Specialty Comments:  No specialty comments available.  PMFS History: Patient Active Problem List   Diagnosis Date Noted   Left leg cellulitis 06/20/2023   Cellulitis 06/19/2023   Cellulitis of lower extremity 06/18/2023   Transaminitis 06/06/2023   Chest pain 06/05/2023    Cellulitis of left lower extremity 06/04/2023   Nocturnal hypoxemia 07/03/2022   Arthritis 04/16/2022   Asthma 04/16/2022   Hematoma of left lower leg    Wheezing 11/23/2021   Snoring 11/23/2021   Other secondary pulmonary hypertension (HCC) 11/23/2021   Hyponatremia 10/11/2021   Dyslipidemia 10/04/2021   Mediastinal mass 10/04/2021   Obstructive sleep apnea 08/25/2021   RUQ pain 08/19/2021   Hepatic steatosis 08/19/2021   Hepatomegaly 08/19/2021   Aortic atherosclerosis (HCC) 08/19/2021   Diverticulosis 08/19/2021   Anxiety 08/02/2021   Bipolar 1 disorder (HCC) 08/02/2021   Chronic diastolic CHF (congestive heart failure) (HCC) 08/02/2021   Depression 08/02/2021   Joint pain 08/02/2021   Screening for malignant neoplasm of colon 08/02/2021   Diastolic dysfunction 08/02/2021   Prediabetes 12/22/2019   Vitamin D insufficiency 12/22/2019   Class 3 severe obesity with serious comorbidity and body mass index (BMI) greater than or equal to 60 in adult [facility] 12/21/2019   Essential hypertension 04/30/2019   Mild intermittent asthma without complication 04/30/2019   Past Medical History:  Diagnosis Date   Anxiety    Aortic atherosclerosis (HCC) 08/19/2021   Arthritis    Asthma    Bipolar 1 disorder (HCC)    Chronic diastolic CHF (congestive heart failure) (HCC)    Class 3 severe obesity with serious comorbidity and body mass index (BMI) greater than or equal to 70 in adult (HCC) 12/21/2019   Depression    Diastolic dysfunction 08/02/2021   Diverticulosis 08/19/2021   Edema of both lower extremities    Hepatic steatosis 08/19/2021   Hepatomegaly 08/19/2021   High blood pressure    Hyperlipidemia    Joint pain    Morbid obesity (HCC)    Pre-diabetes     Family History  Problem Relation Age of Onset   Heart Problems Mother        Broken heart syndrome   Hypertension Mother    Hypercholesterolemia Mother    Bone cancer Father    Testicular cancer Father    Heart attack  Sister    Diabetes Brother    Colon cancer Maternal Grandfather    Dementia Paternal Grandmother     Past Surgical History:  Procedure Laterality Date   APPLICATION OF WOUND VAC Left 12/20/2021   Procedure: APPLICATION OF WOUND VAC;  Surgeon: Nadara Mustard, MD;  Location: [facility] OR;  Service: Orthopedics;  Laterality: Left;   I & D EXTREMITY Left 12/20/2021   Procedure: EXCISIONAL DEBRIDEMENT HEMATOMA LEFT LEG;  Surgeon: Nadara Mustard, MD;  Location: [facility] OR;  Service: Orthopedics;  Laterality: Left;   NO PAST SURGERIES     RIGHT/LEFT HEART CATH AND CORONARY ANGIOGRAPHY N/A 10/06/2021   Procedure: RIGHT/LEFT HEART CATH AND CORONARY ANGIOGRAPHY;  Surgeon: Corky Crafts, MD;  Location: [facility] INVASIVE CV LAB;  Service: Cardiovascular;  Laterality: N/A;   Social History   Occupational History   Occupation: Advertising account planner, work from home  Tobacco Use   Smoking status: Never    Passive exposure: Never   Smokeless tobacco: Never  Vaping Use   Vaping status: Never Used  Substance and Sexual Activity   Alcohol use: Not Currently    Comment: Maybe 2 glasses of wine a month   Drug use: Never   Sexual activity: Not on file

## 2023-06-28 ENCOUNTER — Encounter: Payer: Self-pay | Admitting: Family

## 2023-06-28 ENCOUNTER — Ambulatory Visit (INDEPENDENT_AMBULATORY_CARE_PROVIDER_SITE_OTHER): Payer: Medicaid Other | Admitting: Family

## 2023-06-28 VITALS — BP 169/95 | HR 74 | Temp 98.0°F | Ht 60.0 in | Wt 382.0 lb

## 2023-06-28 DIAGNOSIS — Z6841 Body Mass Index (BMI) 40.0 and over, adult: Secondary | ICD-10-CM

## 2023-06-28 DIAGNOSIS — I5032 Chronic diastolic (congestive) heart failure: Secondary | ICD-10-CM

## 2023-06-28 DIAGNOSIS — E66813 Obesity, class 3: Secondary | ICD-10-CM

## 2023-06-28 DIAGNOSIS — R109 Unspecified abdominal pain: Secondary | ICD-10-CM | POA: Diagnosis not present

## 2023-06-28 DIAGNOSIS — J45909 Unspecified asthma, uncomplicated: Secondary | ICD-10-CM | POA: Diagnosis not present

## 2023-06-28 DIAGNOSIS — G4733 Obstructive sleep apnea (adult) (pediatric): Secondary | ICD-10-CM

## 2023-06-28 DIAGNOSIS — L03116 Cellulitis of left lower limb: Secondary | ICD-10-CM | POA: Diagnosis not present

## 2023-06-28 MED ORDER — METHOCARBAMOL 500 MG PO TABS: 500.0000 mg | ORAL_TABLET | Freq: Three times a day (TID) | ORAL | 0 refills | Status: DC | PRN

## 2023-06-28 NOTE — Progress Notes (Signed)
Patient ID: Christina Dominguez, female    DOB: [birth year], 57 y.o.   MRN: [id number]  Chief Complaint  Patient presents with   Follow-up    Pt was seen in ED for shortness of breath and left lower extremity wound. Pain is getting worse. Has tried gabapentin.    Discussed the use of AI scribe software for clinical note transcription with the patient, who gave verbal consent to proceed.  History of Present Illness   The patient, with a history of heart failure, asthma, OSA, LLE, HTN, and recent hospitalization for cellulitis, presents for a hospital follow up (discharged on 11/15) and with new onset right-sided pain and worsening shortness of breath. She describes the pain as 'excruciating' and 'musculoskeletal,' not associated with movement or pressure. She denies any tingling, numbness or burning pain. The shortness of breath is not associated with chest tightness or asthma symptoms, but worsens with activity.  She was recently hospitalized for cellulitis in the left leg, which has since improved. At hospital discharge she was started on doxycycline and gabapentin for the leg pain and side pain. But she reports no improvement of the pain with the Gabapentin.  She also has a history of edema, which she believes is related to undiagnosed sleep apnea. She has been trying to get a sleep study for over a year, but has faced difficulties with insurance. She has an upcoming appointment with a pulmonologist to hopefully expedite this process.  She also has an upcoming appointment with her cardiologist 11/26 for her heart failure. She is currently on Lasix, Valsartan, and an albuterol inhaler as needed for her asthma. She has not been prescribed a steroid inhaler. She also takes dicyclomine for her stomach.     Assessment & Plan:     Musculoskeletal Pain - New onset of severe right-sided pain during recent hospital visit, not associated with pressure or tingling. Gabapentin trialed without  improvement. -Discontinue Gabapentin due to lack of efficacy and potential kidney damage. -Prescribing Robaxin, cautioning about potential drowsiness, ok to take with xtra strength Tylenol or 650mg  dose tid prn. -Recommend use of a heating pad with automatic shut-off for pain relief up to tid. -F/U with PCP for ongoing concerns.  Cellulitis - Recent hospitalization for left leg cellulitis, now improved. 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 -Continue Doxycycline as prescribed. -Encourage leg elevation at home to reduce edema, and taking Lasix daily as prescribed.  Hypertension - Elevated blood pressure noted during visit. Pt did not take her Valsartan yet today. -Continue Valsartan as prescribed, take as soon as return home. -Encourage patient to take medication consistently, especially on the day of doctor's appointments. -F/U with PCP and Cardiology for ongoing BP elevation.  Sleep Apnea - Diagnosis of sleep apnea with ongoing issues obtaining CPAP machine due to insurance requirements for a new sleep study. -Encourage patient to follow up with Pulmonology for sleep study. -Continue use of home oxygen concentrator.  Heart Failure - Patient has an upcoming appointment with Cardiology on 11/26. -Continue Lasix qam, elevate legs whenever resting. -Encouraged patient to keep Cardiology appointment.  Asthma - Controlled with Albuterol as needed. O2 sat 98% in office. -Continue Albuterol as needed. -Encourage deep breathing exercises for lung health.  -F/U with Pulmonology and PCP prn.     Subjective:    Outpatient Medications Prior to Visit  Medication Sig Dispense Refill   acetaminophen (TYLENOL) 650 MG CR tablet Take 1,300 mg by mouth every 8 (eight) hours as needed for  pain.     albuterol (VENTOLIN HFA) 108 (90 Base) MCG/ACT inhaler Inhale 2 puffs into the lungs every 6 (six) hours as needed for wheezing or shortness of breath. 8 g 6   diphenhydrAMINE (BENADRYL)  25 MG tablet Take 50 mg by mouth at bedtime as needed for sleep.     diphenhydrAMINE HCl, Sleep, (ZZZQUIL PO) Take 3 tablets by mouth at bedtime.     furosemide (LASIX) 80 MG tablet TAKE 1 TABLET(80 MG) BY MOUTH DAILY 30 tablet 1   gabapentin (NEURONTIN) 100 MG capsule Take 1 capsule (100 mg total) by mouth 3 (three) times daily. 90 capsule 1   valsartan (DIOVAN) 40 MG tablet Take 1 tablet (40 mg total) by mouth daily. 90 tablet 1   dicyclomine (BENTYL) 10 MG capsule Take 1 capsule (10 mg total) by mouth 4 (four) times daily -  before meals and at bedtime. (Patient taking differently: Take 10-20 mg by mouth as needed for spasms.) 120 capsule 2   No facility-administered medications prior to visit.   Past Medical History:  Diagnosis Date   Anxiety    Aortic atherosclerosis (HCC) 08/19/2021   Arthritis    Asthma    Bipolar 1 disorder (HCC)    Chronic diastolic CHF (congestive heart failure) (HCC)    Class 3 severe obesity with serious comorbidity and body mass index (BMI) greater than or equal to 70 in adult [facility]) 12/21/2019   Depression    Diastolic dysfunction 08/02/2021   Diverticulosis 08/19/2021   Edema of both lower extremities    Hepatic steatosis 08/19/2021   Hepatomegaly 08/19/2021   High blood pressure    Hyperlipidemia    Joint pain    Morbid obesity (HCC)    Pre-diabetes    Past Surgical History:  Procedure Laterality Date   APPLICATION OF WOUND VAC Left 12/20/2021   Procedure: APPLICATION OF WOUND VAC;  Surgeon: Nadara Mustard, MD;  Location: [facility] OR;  Service: Orthopedics;  Laterality: Left;   I & D EXTREMITY Left 12/20/2021   Procedure: EXCISIONAL DEBRIDEMENT HEMATOMA LEFT LEG;  Surgeon: Nadara Mustard, MD;  Location: [facility] OR;  Service: Orthopedics;  Laterality: Left;   NO PAST SURGERIES     RIGHT/LEFT HEART CATH AND CORONARY ANGIOGRAPHY N/A 10/06/2021   Procedure: RIGHT/LEFT HEART CATH AND CORONARY ANGIOGRAPHY;  Surgeon: Corky Crafts, MD;  Location: [facility] INVASIVE CV  LAB;  Service: Cardiovascular;  Laterality: N/A;   Allergies  Allergen Reactions   Aspirin Nausea And Vomiting   Egg-Derived Products Swelling   Influenza Vaccines Swelling      Objective:    Physical Exam Vitals and nursing note reviewed.  Constitutional:      Appearance: Normal appearance. She is morbidly obese.  Cardiovascular:     Rate and Rhythm: Normal rate and regular rhythm.     Comments: scar on left anterior LL with indention, no erythema, no weeping in either LL, non-pitting edema Pulmonary:     Effort: Pulmonary effort is normal.     Breath sounds: Normal breath sounds.  Musculoskeletal:        General: Normal range of motion.     Right lower leg: 2+ Edema present.     Left lower leg: 2+ Edema present.  Skin:    General: Skin is warm and dry.  Neurological:     Mental Status: She is alert.  Psychiatric:        Mood and Affect: Mood normal.  Behavior: Behavior normal.    BP (!) 169/95 (BP Location: Left Arm, Patient Position: Sitting, Cuff Size: Large)   Pulse 74   Temp 98 F (36.7 C) (Temporal)   Ht 5' (1.524 m)   Wt (!) 382 lb (173.3 kg)   SpO2 98%   BMI 74.60 kg/m  Wt Readings from Last 3 Encounters:  06/28/23 (!) 382 lb (173.3 kg)  06/18/23 (!) 400 lb (181.4 kg)  06/08/23 (!) 352 lb 11.8 oz (160 kg)      Dulce Sellar, NP

## 2023-06-28 NOTE — Telephone Encounter (Signed)
Patient last seen on 07/03/22. Will need updated visit. Scheduled for 08/27/23. Closing encounter.

## 2023-06-29 DIAGNOSIS — Z7902 Long term (current) use of antithrombotics/antiplatelets: Secondary | ICD-10-CM | POA: Diagnosis not present

## 2023-06-29 DIAGNOSIS — I5032 Chronic diastolic (congestive) heart failure: Secondary | ICD-10-CM | POA: Diagnosis not present

## 2023-06-29 DIAGNOSIS — R0789 Other chest pain: Secondary | ICD-10-CM | POA: Diagnosis not present

## 2023-06-29 DIAGNOSIS — I872 Venous insufficiency (chronic) (peripheral): Secondary | ICD-10-CM | POA: Diagnosis not present

## 2023-06-29 DIAGNOSIS — F419 Anxiety disorder, unspecified: Secondary | ICD-10-CM | POA: Diagnosis not present

## 2023-06-29 DIAGNOSIS — E785 Hyperlipidemia, unspecified: Secondary | ICD-10-CM | POA: Diagnosis not present

## 2023-06-29 DIAGNOSIS — R7303 Prediabetes: Secondary | ICD-10-CM | POA: Diagnosis not present

## 2023-06-29 DIAGNOSIS — Z79899 Other long term (current) drug therapy: Secondary | ICD-10-CM | POA: Diagnosis not present

## 2023-06-29 DIAGNOSIS — Z6841 Body Mass Index (BMI) 40.0 and over, adult: Secondary | ICD-10-CM | POA: Diagnosis not present

## 2023-06-29 DIAGNOSIS — I272 Pulmonary hypertension, unspecified: Secondary | ICD-10-CM | POA: Diagnosis not present

## 2023-06-29 DIAGNOSIS — J4520 Mild intermittent asthma, uncomplicated: Secondary | ICD-10-CM | POA: Diagnosis not present

## 2023-06-29 DIAGNOSIS — R0600 Dyspnea, unspecified: Secondary | ICD-10-CM | POA: Diagnosis not present

## 2023-06-29 DIAGNOSIS — Z91199 Patient's noncompliance with other medical treatment and regimen due to unspecified reason: Secondary | ICD-10-CM | POA: Diagnosis not present

## 2023-06-29 DIAGNOSIS — R0602 Shortness of breath: Secondary | ICD-10-CM | POA: Diagnosis not present

## 2023-06-29 DIAGNOSIS — M5416 Radiculopathy, lumbar region: Secondary | ICD-10-CM | POA: Diagnosis not present

## 2023-06-29 DIAGNOSIS — Z20822 Contact with and (suspected) exposure to covid-19: Secondary | ICD-10-CM | POA: Diagnosis not present

## 2023-06-29 DIAGNOSIS — E662 Morbid (severe) obesity with alveolar hypoventilation: Secondary | ICD-10-CM | POA: Diagnosis not present

## 2023-06-29 DIAGNOSIS — F319 Bipolar disorder, unspecified: Secondary | ICD-10-CM | POA: Diagnosis not present

## 2023-06-29 DIAGNOSIS — I110 Hypertensive heart disease with heart failure: Secondary | ICD-10-CM | POA: Diagnosis not present

## 2023-06-29 DIAGNOSIS — I209 Angina pectoris, unspecified: Secondary | ICD-10-CM | POA: Diagnosis not present

## 2023-06-29 DIAGNOSIS — I890 Lymphedema, not elsewhere classified: Secondary | ICD-10-CM | POA: Diagnosis not present

## 2023-06-29 DIAGNOSIS — D649 Anemia, unspecified: Secondary | ICD-10-CM | POA: Diagnosis not present

## 2023-06-30 DIAGNOSIS — M25551 Pain in right hip: Secondary | ICD-10-CM | POA: Insufficient documentation

## 2023-06-30 DIAGNOSIS — R0600 Dyspnea, unspecified: Secondary | ICD-10-CM | POA: Diagnosis not present

## 2023-06-30 DIAGNOSIS — M5416 Radiculopathy, lumbar region: Secondary | ICD-10-CM | POA: Insufficient documentation

## 2023-06-30 DIAGNOSIS — R0789 Other chest pain: Secondary | ICD-10-CM | POA: Diagnosis not present

## 2023-06-30 DIAGNOSIS — Z789 Other specified health status: Secondary | ICD-10-CM | POA: Insufficient documentation

## 2023-06-30 DIAGNOSIS — I890 Lymphedema, not elsewhere classified: Secondary | ICD-10-CM | POA: Insufficient documentation

## 2023-06-30 DIAGNOSIS — R079 Chest pain, unspecified: Secondary | ICD-10-CM | POA: Diagnosis not present

## 2023-07-01 ENCOUNTER — Ambulatory Visit: Payer: Medicaid Other | Admitting: Nurse Practitioner

## 2023-07-01 DIAGNOSIS — I517 Cardiomegaly: Secondary | ICD-10-CM | POA: Diagnosis not present

## 2023-07-01 DIAGNOSIS — R0609 Other forms of dyspnea: Secondary | ICD-10-CM | POA: Diagnosis not present

## 2023-07-01 DIAGNOSIS — Z6841 Body Mass Index (BMI) 40.0 and over, adult: Secondary | ICD-10-CM | POA: Diagnosis not present

## 2023-07-01 DIAGNOSIS — R079 Chest pain, unspecified: Secondary | ICD-10-CM | POA: Diagnosis not present

## 2023-07-02 ENCOUNTER — Ambulatory Visit: Payer: Medicaid Other | Admitting: Nurse Practitioner

## 2023-07-02 DIAGNOSIS — R079 Chest pain, unspecified: Secondary | ICD-10-CM | POA: Diagnosis not present

## 2023-07-02 NOTE — Progress Notes (Deleted)
Office Visit    Patient Name: Christina Dominguez Date of Encounter: 07/02/2023  Primary Care Provider:  Allwardt, Crist Infante, PA-C Primary Cardiologist:  Norman Herrlich, MD  Chief Complaint    57 year old female with a history of chronic diastolic heart failure, pulmonary hypertension, OSA, hypertension, hyperlipidemia, prediabetes, and morbid obesity who presents for follow-up related to heart failure.   Past Medical History    Past Medical History:  Diagnosis Date   Anxiety    Aortic atherosclerosis (HCC) 08/19/2021   Arthritis    Asthma    Bipolar 1 disorder (HCC)    Chronic diastolic CHF (congestive heart failure) (HCC)    Class 3 severe obesity with serious comorbidity and body mass index (BMI) greater than or equal to 70 in adult [facility]) 12/21/2019   Depression    Diastolic dysfunction 08/02/2021   Diverticulosis 08/19/2021   Edema of both lower extremities    Hepatic steatosis 08/19/2021   Hepatomegaly 08/19/2021   High blood pressure    Hyperlipidemia    Joint pain    Morbid obesity (HCC)    Pre-diabetes    Past Surgical History:  Procedure Laterality Date   APPLICATION OF WOUND VAC Left 12/20/2021   Procedure: APPLICATION OF WOUND VAC;  Surgeon: Nadara Mustard, MD;  Location: [facility] OR;  Service: Orthopedics;  Laterality: Left;   I & D EXTREMITY Left 12/20/2021   Procedure: EXCISIONAL DEBRIDEMENT HEMATOMA LEFT LEG;  Surgeon: Nadara Mustard, MD;  Location: [facility] OR;  Service: Orthopedics;  Laterality: Left;   NO PAST SURGERIES     RIGHT/LEFT HEART CATH AND CORONARY ANGIOGRAPHY N/A 10/06/2021   Procedure: RIGHT/LEFT HEART CATH AND CORONARY ANGIOGRAPHY;  Surgeon: Corky Crafts, MD;  Location: [facility] INVASIVE CV LAB;  Service: Cardiovascular;  Laterality: N/A;    Allergies  Allergies  Allergen Reactions   Aspirin Nausea And Vomiting   Egg-Derived Products Swelling   Influenza Vaccines Swelling     Labs/Other Studies Reviewed    The following studies were reviewed  today:  Cardiac Studies & Procedures   CARDIAC CATHETERIZATION  CARDIAC CATHETERIZATION 10/06/2021  Narrative   The left ventricular systolic function is normal.   LV end diastolic pressure is moderately elevated.   The left ventricular ejection fraction is 55-65% by visual estimate.   There is no aortic valve stenosis.   No angiographically apparent coronary artery disease.   Hemodynamic findings consistent with moderate pulmonary hypertension.   On 2L/min [place] O2:Ao sat 99%, PA sat 71%, PA 53/30, mean PA pressure 43 mm Hg; mean PCWP 24 mm Hg; CO 7.35 L/min; CI 3.04.  Mild volume overload.  Continue medical therapy.  Findings Coronary Findings Diagnostic  Dominance: Co-dominant  Left Main Vessel was injected. Vessel is normal in caliber. Vessel is angiographically normal.  Left Anterior Descending Vessel was injected. Vessel is normal in caliber. Vessel is angiographically normal.  Left Circumflex Vessel was injected. Vessel is normal in caliber. Vessel is angiographically normal.  Right Coronary Artery Vessel was injected. Vessel is normal in caliber. Vessel is angiographically normal.  Intervention  No interventions have been documented.     ECHOCARDIOGRAM  ECHOCARDIOGRAM COMPLETE 08/20/2021  Narrative ECHOCARDIOGRAM REPORT    Patient Name:   Christina Dominguez Date of Exam: 08/20/2021 Medical Rec #:  [id number]          Height:       61.0 in Accession #:    [id number]         Weight:  387.8 lb Date of Birth:  02/27/1966           BSA:          2.503 m Patient Age:    55 years           BP:           134/75 mmHg Patient Gender: F                  HR:           68 bpm. Exam Location:  Inpatient  Procedure: 2D Echo, Cardiac Doppler and Color Doppler  Indications:    CHF  History:        Patient has no prior history of Echocardiogram examinations. Risk Factors:Hypertension.  Sonographer:    Cleatis Polka Referring Phys: [id number] DAVID MANUEL  ORTIZ  IMPRESSIONS   1. Left ventricular ejection fraction, by estimation, is 55 to 60%. The left ventricle has normal function. The left ventricle has no regional wall motion abnormalities. There is mild concentric left ventricular hypertrophy. Left ventricular diastolic parameters are consistent with Grade I diastolic dysfunction (impaired relaxation). 2. Right ventricular systolic function is normal. The right ventricular size is normal. Tricuspid regurgitation signal is inadequate for assessing PA pressure. 3. The mitral valve is normal in structure. No evidence of mitral valve regurgitation. No evidence of mitral stenosis. 4. The aortic valve was not well visualized. Aortic valve regurgitation is not visualized. No aortic stenosis is present. 5. The inferior vena cava is normal in size with <50% respiratory variability, suggesting right atrial pressure of 8 mmHg.  Comparison(s): No prior Echocardiogram.  FINDINGS Left Ventricle: Left ventricular ejection fraction, by estimation, is 55 to 60%. The left ventricle has normal function. The left ventricle has no regional wall motion abnormalities. The left ventricular internal cavity size was normal in size. There is mild concentric left ventricular hypertrophy. Left ventricular diastolic parameters are consistent with Grade I diastolic dysfunction (impaired relaxation).  Right Ventricle: The right ventricular size is normal. No increase in right ventricular wall thickness. Right ventricular systolic function is normal. Tricuspid regurgitation signal is inadequate for assessing PA pressure.  Left Atrium: Left atrial size was normal in size.  Right Atrium: Right atrial size was normal in size.  Pericardium: Trivial pericardial effusion is present. Presence of epicardial fat layer.  Mitral Valve: The mitral valve is normal in structure. No evidence of mitral valve regurgitation. No evidence of mitral valve stenosis.  Tricuspid Valve: The  tricuspid valve is normal in structure. Tricuspid valve regurgitation is not demonstrated. No evidence of tricuspid stenosis.  Aortic Valve: The aortic valve was not well visualized. Aortic valve regurgitation is not visualized. No aortic stenosis is present. Aortic valve peak gradient measures 8.6 mmHg.  Pulmonic Valve: The pulmonic valve was not well visualized. Pulmonic valve regurgitation is not visualized. No evidence of pulmonic stenosis.  Aorta: The aortic root and ascending aorta are structurally normal, with no evidence of dilitation.  Venous: The inferior vena cava is normal in size with less than 50% respiratory variability, suggesting right atrial pressure of 8 mmHg.  IAS/Shunts: No atrial level shunt detected by color flow Doppler.   LEFT VENTRICLE PLAX 2D LVIDd:         4.60 cm      Diastology LVIDs:         3.20 cm      LV e' medial:    7.07 cm/s LV PW:  1.10 cm      LV E/e' medial:  12.7 LV IVS:        1.30 cm      LV e' lateral:   9.68 cm/s LVOT diam:     2.00 cm      LV E/e' lateral: 9.3 LV SV:         77 LV SV Index:   31 LVOT Area:     3.14 cm  LV Volumes (MOD) LV vol d, MOD A2C: 115.0 ml LV vol d, MOD A4C: 126.0 ml LV vol s, MOD A2C: 48.9 ml LV vol s, MOD A4C: 52.8 ml LV SV MOD A2C:     66.1 ml LV SV MOD A4C:     126.0 ml LV SV MOD BP:      71.9 ml  RIGHT VENTRICLE             IVC RV Basal diam:  4.00 cm     IVC diam: 2.10 cm RV S prime:     11.10 cm/s  LEFT ATRIUM             Index        RIGHT ATRIUM           Index LA diam:        3.00 cm 1.20 cm/m   RA Area:     19.40 cm LA Vol (A2C):   47.4 ml 18.94 ml/m  RA Volume:   55.20 ml  22.06 ml/m LA Vol (A4C):   47.8 ml 19.10 ml/m LA Biplane Vol: 48.0 ml 19.18 ml/m AORTIC VALVE AV Area (Vmax): 2.29 cm AV Vmax:        147.00 cm/s AV Peak Grad:   8.6 mmHg LVOT Vmax:      107.00 cm/s LVOT Vmean:     74.900 cm/s LVOT VTI:       0.246 m  AORTA Ao Root diam: 2.90 cm Ao Asc diam:  3.20  cm  MITRAL VALVE MV Area (PHT): 3.85 cm    SHUNTS MV Decel Time: 197 msec    Systemic VTI:  0.25 m MV E velocity: 90.00 cm/s  Systemic Diam: 2.00 cm MV A velocity: 91.20 cm/s MV E/A ratio:  0.99  Riley Lam MD Electronically signed by Riley Lam MD Signature Date/Time: 08/20/2021/12:40:53 PM    Final            Recent Labs: 06/06/2023: ALT 48 06/18/2023: B Natriuretic Peptide 61.4 06/21/2023: BUN 24; Creatinine, Ser 0.67; Hemoglobin 11.7; Magnesium 2.1; Platelets 367; Potassium 3.5; Sodium 134  Recent Lipid Panel    Component Value Date/Time   CHOL 158 12/21/2019 1350   TRIG 58 12/21/2019 1350   HDL 58 12/21/2019 1350   LDLCALC 88 12/21/2019 1350    History of Present Illness    57 year old female with the above past medical history including chronic diastolic heart failure, pulmonary hypertension, OSA, hypertension, hyperlipidemia, prediabetes, and morbid obesity.   Echocardiogram in January 2023 revealed EF 55 to 60%, no RWMA, normal LV function, mild concentric LVH, G1 DD, normal RV systolic function, no significant valvular abnormalities. R/LHC in 10/2021 revealed no evidence of CAD, EF 55 to 65%, moderately elevated LVEDP, normal LV systolic function, moderate pulmonary hypertension, mean PA pressure 43 mmHg.  She was last seen in the office on 04/12/2022 and was stable from a cardiac standpoint.  She was hospitalized in October 2020 for in the setting of lower extremity cellulitis.  She was hospitalized again from 06/18/2023  at 06/21/2023 in the setting of left lower extremity cellulitis acute on chronic diastolic heart failure.  She was diuresed with IV Lasix.  She presents today for follow-up.  Since her last visit and since her recent hospitalizations she has  1. Chronic diastolic heart failure: Echo in January 2023 revealed EF 55 to 60%, no RWMA, normal LV function, mild concentric LVH, G1 DD, normal RV systolic function, no significant valvular  abnormalities.  Weight is up >10 pounds in the past month.  She appears euvolemic on exam, she denies any dyspnea, edema, PND, orthopnea.  Continue to monitor.  For now, continue valsartan, Lasix at current dose (120 mg daily).   2. Pulmonary hypertension:  R/LHC in 10/2021 revealed no evidence of CAD, EF 55 to 65%, moderately elevated LVEDP, normal LV systolic function, moderate pulmonary hypertension, mean PA pressure 43 mmHg.  To be secondary to untreated OSA and diastolic heart failure.  Generally euvolemic well compensated exam.  She denies dyspnea. Continue Lasix as above.   3. Hypertension: BP well controlled. Continue current antihypertensive regimen.    4. Hyperlipidemia: No recent LDL on file. No longer on statin therapy. The 10-year ASCVD risk score (Arnett DK, et al., 2019) is: 3.9%   5. Prediabetes: A1c was 5.9 in 08/2021.  Monitored and  managed per PCP.   6. OSA: She is still awaiting her CPAP.  Follow-up with pulmonology.   7. Obesity: Continue weight loss efforts. She is eager to get back to her water aerobics as soon as her foot is healed.   8. Lower extremity edema/cellulitis:  9. Disposition: Follow-up scheduled   Home Medications    Current Outpatient Medications  Medication Sig Dispense Refill   acetaminophen (TYLENOL) 650 MG CR tablet Take 1,300 mg by mouth every 8 (eight) hours as needed for pain.     albuterol (VENTOLIN HFA) 108 (90 Base) MCG/ACT inhaler Inhale 2 puffs into the lungs every 6 (six) hours as needed for wheezing or shortness of breath. 8 g 6   dicyclomine (BENTYL) 10 MG capsule Take 1 capsule (10 mg total) by mouth 4 (four) times daily -  before meals and at bedtime. (Patient taking differently: Take 10-20 mg by mouth as needed for spasms.) 120 capsule 2   diphenhydrAMINE (BENADRYL) 25 MG tablet Take 50 mg by mouth at bedtime as needed for sleep.     diphenhydrAMINE HCl, Sleep, (ZZZQUIL PO) Take 3 tablets by mouth at bedtime.     furosemide (LASIX) 80  MG tablet TAKE 1 TABLET(80 MG) BY MOUTH DAILY 30 tablet 1   gabapentin (NEURONTIN) 100 MG capsule Take 1 capsule (100 mg total) by mouth 3 (three) times daily. 90 capsule 1   methocarbamol (ROBAXIN) 500 MG tablet Take 1-2 tablets (500-1,000 mg total) by mouth every 8 (eight) hours as needed for muscle spasms (For side pain.). 60 tablet 0   valsartan (DIOVAN) 40 MG tablet Take 1 tablet (40 mg total) by mouth daily. 90 tablet 1   No current facility-administered medications for this visit.     Review of Systems    ***.  All other systems reviewed and are otherwise negative except as noted above.    Physical Exam    VS:  There were no vitals taken for this visit. , BMI There is no height or weight on file to calculate BMI.     GEN: Well nourished, well developed, in no acute distress. HEENT: normal. Neck: Supple, no JVD, carotid bruits, or masses. Cardiac: RRR,  no murmurs, rubs, or gallops. No clubbing, cyanosis, edema.  Radials/DP/PT 2+ and equal bilaterally.  Respiratory:  Respirations regular and unlabored, clear to auscultation bilaterally. GI: Soft, nontender, nondistended, BS + x 4. MS: no deformity or atrophy. Skin: warm and dry, no rash. Neuro:  Strength and sensation are intact. Psych: Normal affect.  Accessory Clinical Findings    ECG personally reviewed by me today -    - no acute changes.   Lab Results  Component Value Date   WBC 9.6 06/21/2023   HGB 11.7 (L) 06/21/2023   HCT 37.0 06/21/2023   MCV 91.4 06/21/2023   PLT 367 06/21/2023   Lab Results  Component Value Date   CREATININE 0.67 06/21/2023   BUN 24 (H) 06/21/2023   NA 134 (L) 06/21/2023   K 3.5 06/21/2023   CL 95 (L) 06/21/2023   CO2 31 06/21/2023   Lab Results  Component Value Date   ALT 48 (H) 06/06/2023   AST 46 (H) 06/06/2023   ALKPHOS 102 06/06/2023   BILITOT 0.5 06/06/2023   Lab Results  Component Value Date   CHOL 158 12/21/2019   HDL 58 12/21/2019   LDLCALC 88 12/21/2019   TRIG 58  12/21/2019    Lab Results  Component Value Date   HGBA1C 5.8 (H) 06/19/2023    Assessment & Plan    1.  ***  No BP recorded.  {Refresh Note OR Click here to enter BP  :1}***   Joylene Grapes, NP 07/02/2023, 6:22 AM

## 2023-07-03 DIAGNOSIS — R079 Chest pain, unspecified: Secondary | ICD-10-CM | POA: Diagnosis not present

## 2023-07-07 DIAGNOSIS — R531 Weakness: Secondary | ICD-10-CM | POA: Diagnosis not present

## 2023-07-07 DIAGNOSIS — I509 Heart failure, unspecified: Secondary | ICD-10-CM | POA: Diagnosis not present

## 2023-07-07 DIAGNOSIS — G4733 Obstructive sleep apnea (adult) (pediatric): Secondary | ICD-10-CM | POA: Diagnosis not present

## 2023-07-08 ENCOUNTER — Telehealth: Payer: Self-pay | Admitting: Physician Assistant

## 2023-07-08 ENCOUNTER — Ambulatory Visit: Payer: Medicaid Other | Admitting: Physical Therapy

## 2023-07-08 NOTE — Telephone Encounter (Signed)
Home Health Verbal Orders  Agency: [facility]   Caller: Louanne Belton and title: Ph# [phone] / PT  Requesting PT:    Reason for Request:  Better strength and balance  Frequency:  1 x per week for 4 weeks  HH needs F2F w/in last 30 days

## 2023-07-08 NOTE — Telephone Encounter (Signed)
Please see call note for verbal orders; patient not seen in office with PCP since 03/20/22. Last seen [place] 06/28/23 for flank pain

## 2023-07-08 NOTE — Telephone Encounter (Signed)
Returned call to Arlys John from [facility] and advised PCP approval on verbal PT orders, Arlys John verbalized understanding

## 2023-07-15 ENCOUNTER — Other Ambulatory Visit: Payer: Self-pay

## 2023-07-15 ENCOUNTER — Other Ambulatory Visit: Payer: Self-pay | Admitting: *Deleted

## 2023-07-15 DIAGNOSIS — R2689 Other abnormalities of gait and mobility: Secondary | ICD-10-CM

## 2023-07-15 DIAGNOSIS — R5383 Other fatigue: Secondary | ICD-10-CM

## 2023-07-15 NOTE — Patient Instructions (Signed)
Visit Information  Christina Dominguez was given information about Medicaid Managed Care team care coordination services as a part of their [facility] Community Plan Medicaid benefit. Christina Dominguez verbally consented to engagement with the [facility] Managed Care team.   If you are experiencing a medical emergency, please call 911 or report to your local emergency department or urgent care.   If you have a non-emergency medical problem during routine business hours, please contact your provider's office and ask to speak with a nurse.   For questions related to your [facility], please call: [phone] or visit the homepage here: [email or web address]  If you would like to schedule transportation through your [facility], please call the following number at least 2 days in advance of your appointment: [phone]   Rides for urgent appointments can also be made after hours by calling Member Services.  Call the Behavioral Health Crisis Line at [phone], at any time, 24 hours a day, 7 days a week. If you are in danger or need immediate medical attention call 911.  If you would like help to quit smoking, call 1-800-QUIT-NOW (772-026-2454) OR Espaol: 1-855-Djelo-Ya (8-315-176-1607) o para ms informacin haga clic aqu or Text READY to 371-062 to register via text  Ms. Harshman,   Please see education materials related to CHF provided by MyChart link.  Patient verbalizes understanding of instructions and care plan provided today and agrees to view in MyChart. Active MyChart status and patient understanding of how to access instructions and care plan via MyChart confirmed with patient.     Telephone follow up appointment with Managed Medicaid care management team member scheduled for:07/23/23 at 9am  Estanislado Emms RN, BSN [place]  Value-Based [facility]  Health RN Care Coordinator [phone]   Following is a copy of your plan of care:  Care Plan : RN Care Manager Plan of Care  Updates made by Heidi Dach, RN since 07/15/2023 12:00 AM     Problem: Health Management needs related to CHF      Long-Range Goal: Development of Plan of Care to address Health Management needs related to CHF   Start Date: 07/15/2023  Expected End Date: 10/13/2023  Note:   Current Barriers:  Chronic Disease Management support and education needs related to CHF   RNCM Clinical Goal(s):  Patient will verbalize understanding of plan for management of CHF as evidenced by patient reports take all medications exactly as prescribed and will call provider for medication related questions as evidenced by patient reports attend all scheduled medical appointments: PT evaluation on 07/23/23, Orthopedic on 07/24/23, Pulmonology on 08/27/23 and reschedule with Cardiology as evidenced by provider documentation continue to work with RN Care Manager to address care management and care coordination needs related to  CHF as evidenced by adherence to CM Team Scheduled appointments work with social worker to address  related to the management of Level of care concerns related to the management of CHF as evidenced by review of EMR and patient or Child psychotherapist report through collaboration with Medical illustrator, provider, and care team.   Interventions: Evaluation of current treatment plan related to  self management and patient's adherence to plan as established by provider   Heart Failure Interventions:  (Status:  New goal.) Long Term Goal Basic overview and discussion of pathophysiology of Heart Failure reviewed Provided education on low sodium diet Reviewed Heart Failure Action Plan in depth and provided written copy Reviewed role of  diuretics in prevention of fluid overload and management of heart failure; Discussed the importance of keeping all appointments with  provider Assessed social determinant of health barriers  Provided patient with information for medical transportation provided by [facility] [phone] Advised patient to reschedule missed appointment with Cardiology Reviewed recent PCP note with patient and discussed recommendation of stopping Gabapentin BSW referral for assistance with PCS Collaborated with PCP requesting shower chair  Patient Goals/Self-Care Activities: Take all medications as prescribed Attend all scheduled provider appointments Call provider office for new concerns or questions  Work with the social worker to address care coordination needs and will continue to work with the clinical team to address health care and disease management related needs call office if I gain more than 2 pounds in one day or 5 pounds in one week use salt in moderation watch for swelling in feet, ankles and legs every day eat more whole grains, fruits and vegetables, lean meats and healthy fats  Follow Up Plan:  Telephone follow up appointment with care management team member scheduled for:  07/23/23 at 9am to complete initial outreach

## 2023-07-15 NOTE — Patient Outreach (Signed)
Medicaid Managed Care   Nurse Care Manager Note  07/15/2023 Name:  Christina Dominguez MRN:  [id number] DOB:  01/02/1966  Christina Dominguez is an 57 y.o. year old female who is a primary patient of Allwardt, Alyssa M, PA-C.  The [facility] Managed Care Coordination team was consulted for assistance with:    CHF  Ms. Ramseur was given information about Medicaid Managed Care Coordination team services today. Christina Dominguez Patient agreed to services and verbal consent obtained.  Engaged with patient by telephone for initial visit in response to provider referral for case management and/or care coordination services.   Assessments/Interventions:  Review of past medical history, allergies, medications, health status, including review of consultants reports, laboratory and other test data, was performed as part of comprehensive evaluation and provision of chronic care management services.  SDOH (Social Determinants of Health) assessments and interventions performed: SDOH Interventions    Flowsheet Row Patient Outreach Telephone from 07/15/2023 in [place] POPULATION HEALTH DEPARTMENT Telephone from 06/11/2023 in [place] POPULATION HEALTH DEPARTMENT ED to Hosp-Admission (Discharged) from 10/03/2021 in [place] 2C CV PROGRESSIVE CARE  SDOH Interventions     Food Insecurity Interventions Intervention Not Indicated Intervention Not Indicated Intervention Not Indicated  Housing Interventions Other (Comment)  [Patient has discussed the issue with the apartment manager] -- Intervention Not Indicated  Transportation Interventions -- Intervention Not Indicated Intervention Not Indicated  Utilities Interventions Patient Declined -- --  Financial Strain Interventions -- -- Other (Comment)  [May need to consider disability]       Care Plan  Allergies  Allergen Reactions   Aspirin Nausea And Vomiting   Egg-Derived Products Swelling   Influenza Vaccines Swelling    Medications Reviewed  Today     Reviewed by Heidi Dach, RN (Registered Nurse) on 07/15/23 at [phone]  Med List Status: <None>   Medication Order Taking? Sig Documenting Provider Last Dose Status Informant  acetaminophen (TYLENOL) 650 MG CR tablet [id number] Yes Take 1,300 mg by mouth every 8 (eight) hours as needed for pain. [provider] Taking Active Self, Pharmacy Records  albuterol (VENTOLIN HFA) 108 (90 Base) MCG/ACT inhaler [id number] Yes Inhale 2 puffs into the lungs every 6 (six) hours as needed for wheezing or shortness of breath. Luciano Cutter, MD Taking Active Self, Pharmacy Records  atorvastatin (LIPITOR) 40 MG tablet [id number] Yes Take 1 tablet by mouth daily. [provider] Taking Active   clopidogrel (PLAVIX) 75 MG tablet [id number] Yes Take 1 tablet by mouth daily. [provider] Taking Active   dicyclomine (BENTYL) 10 MG capsule [id number] Yes Take 1 capsule (10 mg total) by mouth 4 (four) times daily -  before meals and at bedtime.  Patient taking differently: Take 10-20 mg by mouth as needed for spasms.   Allwardt, Crist Infante, PA-C Taking Active Self, Pharmacy Records  diphenhydrAMINE (BENADRYL) 25 MG tablet [id number] Yes Take 50 mg by mouth at bedtime as needed for sleep. [provider] Taking Active Self, Pharmacy Records  diphenhydrAMINE HCl, Sleep, (ZZZQUIL PO) [id number] Yes Take 3 tablets by mouth at bedtime. [provider] Taking Active Self, Pharmacy Records  famotidine (PEPCID) 20 MG tablet [id number] Yes Take by mouth. [provider] Taking Active   furosemide (LASIX) 80 MG tablet [id number] Yes TAKE 1 TABLET(80 MG) BY MOUTH DAILY Georgeanna Lea, MD Taking Active Self, Pharmacy Records  gabapentin (NEURONTIN) 100 MG capsule [id number] Yes Take 1 capsule (100 mg total) by mouth 3 (three) times daily.  Adonis Huguenin, NP Taking Active   methocarbamol (ROBAXIN) 500 MG tablet [id number] Yes Take 1-2 tablets (500-1,000 mg total) by mouth  every 8 (eight) hours as needed for muscle spasms (For side pain.). Dulce Sellar, NP Taking Active   potassium chloride (KLOR-CON) 10 MEQ tablet [id number] Yes Take by mouth. [provider] Taking Active   valsartan (DIOVAN) 40 MG tablet [id number] Yes Take 1 tablet (40 mg total) by mouth daily. Georgeanna Lea, MD Taking Active Self, Pharmacy Records            Patient Active Problem List   Diagnosis Date Noted   Left leg cellulitis 06/20/2023   Cellulitis 06/19/2023   Cellulitis of lower extremity 06/18/2023   Transaminitis 06/06/2023   Chest pain 06/05/2023   Cellulitis of left lower extremity 06/04/2023   Nocturnal hypoxemia 07/03/2022   Arthritis 04/16/2022   Asthma 04/16/2022   Hematoma of left lower leg    Wheezing 11/23/2021   Snoring 11/23/2021   Other secondary pulmonary hypertension (HCC) 11/23/2021   Hyponatremia 10/11/2021   Dyslipidemia 10/04/2021   Mediastinal mass 10/04/2021   Obstructive sleep apnea 08/25/2021   RUQ pain 08/19/2021   Hepatic steatosis 08/19/2021   Hepatomegaly 08/19/2021   Aortic atherosclerosis (HCC) 08/19/2021   Diverticulosis 08/19/2021   Anxiety 08/02/2021   Bipolar 1 disorder (HCC) 08/02/2021   Chronic diastolic CHF (congestive heart failure) (HCC) 08/02/2021   Depression 08/02/2021   Joint pain 08/02/2021   Screening for malignant neoplasm of colon 08/02/2021   Diastolic dysfunction 08/02/2021   Prediabetes 12/22/2019   Vitamin D insufficiency 12/22/2019   Class 3 severe obesity with serious comorbidity and body mass index (BMI) greater than or equal to 60 in adult [facility]) 12/21/2019   Essential hypertension 04/30/2019   Mild intermittent asthma without complication 04/30/2019    Conditions to be addressed/monitored per PCP order:  CHF  Care Plan : RN Care Manager Plan of Care  Updates made by Heidi Dach, RN since 07/15/2023 12:00 AM     Problem: Health Management needs related to CHF      Long-Range  Goal: Development of Plan of Care to address Health Management needs related to CHF   Start Date: 07/15/2023  Expected End Date: 10/13/2023  Note:   Current Barriers:  Chronic Disease Management support and education needs related to CHF   RNCM Clinical Goal(s):  Patient will verbalize understanding of plan for management of CHF as evidenced by patient reports take all medications exactly as prescribed and will call provider for medication related questions as evidenced by patient reports attend all scheduled medical appointments: PT evaluation on 07/23/23, Orthopedic on 07/24/23, Pulmonology on 08/27/23 and reschedule with Cardiology as evidenced by provider documentation continue to work with RN Care Manager to address care management and care coordination needs related to  CHF as evidenced by adherence to CM Team Scheduled appointments work with social worker to address  related to the management of Level of care concerns related to the management of CHF as evidenced by review of EMR and patient or Child psychotherapist report through collaboration with Medical illustrator, provider, and care team.   Interventions: Evaluation of current treatment plan related to  self management and patient's adherence to plan as established by provider   Heart Failure Interventions:  (Status:  New goal.) Long Term Goal Basic overview and discussion of pathophysiology of Heart Failure reviewed Provided education on low sodium diet Reviewed Heart Failure Action Plan in depth and provided  written copy Reviewed role of diuretics in prevention of fluid overload and management of heart failure; Discussed the importance of keeping all appointments with provider Assessed social determinant of health barriers  Provided patient with information for medical transportation provided by [facility] [phone] Advised patient to reschedule missed appointment with Cardiology Reviewed recent PCP note with patient and discussed  recommendation of stopping Gabapentin BSW referral for assistance with PCS Collaborated with PCP requesting shower chair  Patient Goals/Self-Care Activities: Take all medications as prescribed Attend all scheduled provider appointments Call provider office for new concerns or questions  Work with the social worker to address care coordination needs and will continue to work with the clinical team to address health care and disease management related needs call office if I gain more than 2 pounds in one day or 5 pounds in one week use salt in moderation watch for swelling in feet, ankles and legs every day eat more whole grains, fruits and vegetables, lean meats and healthy fats  Follow Up Plan:  Telephone follow up appointment with care management team member scheduled for:  07/23/23 at 9am to complete initial outreach      Follow Up:  Patient agrees to Care Plan and Follow-up.  Plan: The Managed Medicaid care management team will reach out to the patient again over the next 7 days.  Date/time of next scheduled RN care management/care coordination outreach:  07/23/23 at 9am  Estanislado Emms RN, BSN [place]  [facility] RN Care Coordinator [phone]

## 2023-07-23 ENCOUNTER — Ambulatory Visit: Payer: Medicaid Other

## 2023-07-23 ENCOUNTER — Other Ambulatory Visit: Payer: Self-pay | Admitting: *Deleted

## 2023-07-23 NOTE — Patient Instructions (Signed)
Visit Information  Christina Dominguez was given information about Medicaid Managed Care team care coordination services as a part of their [facility] Community Plan Medicaid benefit. Christina Dominguez verbally consented to engagement with the [facility] Managed Care team.   If you are experiencing a medical emergency, please call 911 or report to your local emergency department or urgent care.   If you have a non-emergency medical problem during routine business hours, please contact your provider's office and ask to speak with a nurse.   For questions related to your [facility], please call: [phone] or visit the homepage here: [email or web address]  If you would like to schedule transportation through your [facility], please call the following number at least 2 days in advance of your appointment: [phone]   Rides for urgent appointments can also be made after hours by calling Member Services.  Call the Behavioral Health Crisis Line at [phone], at any time, 24 hours a day, 7 days a week. If you are in danger or need immediate medical attention call 911.  If you would like help to quit smoking, call 1-800-QUIT-NOW ((219)759-6198) OR Espaol: 1-855-Djelo-Ya (6-010-932-3557) o para ms informacin haga clic aqu or Text READY to 322-025 to register via text  Christina Dominguez,   Please see education materials related to CHF provided by MyChart link.  Patient verbalizes understanding of instructions and care plan provided today and agrees to view in MyChart. Active MyChart status and patient understanding of how to access instructions and care plan via MyChart confirmed with patient.     Telephone follow up appointment with Managed Medicaid care management team member scheduled for:08/14/23 at 9am  Estanislado Emms RN, BSN [place]  [facility] RN Care Coordinator [phone]   Following is a copy of your plan of care:  Care Plan : RN Care Manager Plan of Care  Updates made by Heidi Dach, RN since 07/23/2023 12:00 AM     Problem: Health Management needs related to CHF      Long-Range Goal: Development of Plan of Care to address Health Management needs related to CHF   Start Date: 07/15/2023  Expected End Date: 10/13/2023  Note:   Current Barriers:  Chronic Disease Management support and education needs related to CHF   RNCM Clinical Goal(s):  Patient will verbalize understanding of plan for management of CHF as evidenced by patient reports take all medications exactly as prescribed and will call provider for medication related questions as evidenced by patient reports attend all scheduled medical appointments: PT evaluation on 07/23/23, [facility], Orthopedic on 07/24/23, Pulmonology on 08/27/23 and reschedule with Cardiology as evidenced by provider documentation continue to work with RN Care Manager to address care management and care coordination needs related to  CHF as evidenced by adherence to CM Team Scheduled appointments work with social worker to address  related to the management of Level of care concerns related to the management of CHF as evidenced by review of EMR and patient or Child psychotherapist report through collaboration with Medical illustrator, provider, and care team.   Interventions: Evaluation of current treatment plan related to  self management and patient's adherence to plan as established by provider   Heart Failure Interventions:  (Status:  Goal on track:  Yes.) Long Term Goal Provided education on low sodium diet Reviewed role of diuretics in prevention of fluid overload and management of heart failure; Discussed the importance of  keeping all appointments with provider Provided patient with education about the role of exercise in the management of heart failure Assessed  social determinant of health barriers  Provided patient with information for medical transportation provided by [facility] [phone] Advised patient to reschedule missed appointment with Cardiology-revisited Reviewed recent PCP note with patient and discussed recommendation of stopping Gabapentin BSW referral for assistance with PCS Collaborated with PCP requesting shower chair Reviewed medications and updated in EMR, advised patient to call for refills 3-4 days prior to running out  Patient Goals/Self-Care Activities: Take all medications as prescribed Attend all scheduled provider appointments Call provider office for new concerns or questions  Work with the social worker to address care coordination needs and will continue to work with the clinical team to address health care and disease management related needs call office if I gain more than 2 pounds in one day or 5 pounds in one week use salt in moderation watch for swelling in feet, ankles and legs every day eat more whole grains, fruits and vegetables, lean meats and healthy fats  Follow Up Plan:  Telephone follow up appointment with care management team member scheduled for:  08/13/22 at 9am

## 2023-07-23 NOTE — Patient Outreach (Signed)
Medicaid Managed Care   Nurse Care Manager Note  07/23/2023 Name:  Christina Dominguez MRN:  [id number] DOB:  08-22-1965  Christina Dominguez is an 57 y.o. year old female who is a primary patient of Allwardt, Alyssa M, PA-C.  The [facility] Managed Care Coordination team was consulted for assistance with:    CHF  [name] was given information about Medicaid Managed Care Coordination team services today. Christina Dominguez Patient agreed to services and verbal consent obtained.  Engaged with patient by telephone for follow up visit in response to provider referral for case management and/or care coordination services.   Patient is participating in a Managed Medicaid Plan:  Yes  Assessments/Interventions:  Review of past medical history, allergies, medications, health status, including review of consultants reports, laboratory and other test data, was performed as part of comprehensive evaluation and provision of chronic care management services.  SDOH (Social Drivers of Health) assessments and interventions performed: SDOH Interventions    Flowsheet Row Patient Outreach Telephone from 07/15/2023 in [place] POPULATION HEALTH DEPARTMENT Telephone from 06/11/2023 in  POPULATION HEALTH DEPARTMENT ED to Hosp-Admission (Discharged) from 10/03/2021 in [place] 2C CV PROGRESSIVE CARE  SDOH Interventions     Food Insecurity Interventions Intervention Not Indicated Intervention Not Indicated Intervention Not Indicated  Housing Interventions Other (Comment)  [Patient has discussed the issue with the apartment manager] -- Intervention Not Indicated  Transportation Interventions -- Intervention Not Indicated Intervention Not Indicated  Utilities Interventions Patient Declined -- --  Financial Strain Interventions -- -- Other (Comment)  [May need to consider disability]       Care Plan  Allergies  Allergen Reactions   Aspirin Nausea And Vomiting   Egg-Derived Products Swelling    Influenza Vaccines Swelling    Medications Reviewed Today     Reviewed by Heidi Dach, RN (Registered Nurse) on 07/23/23 at 0915  Med List Status: <None>   Medication Order Taking? Sig Documenting Provider Last Dose Status Informant  acetaminophen (TYLENOL) 650 MG CR tablet [id number] Yes Take 1,300 mg by mouth every 8 (eight) hours as needed for pain. [provider] Taking Active Self, Pharmacy Records  albuterol (VENTOLIN HFA) 108 (90 Base) MCG/ACT inhaler [id number] Yes Inhale 2 puffs into the lungs every 6 (six) hours as needed for wheezing or shortness of breath. Luciano Cutter, MD Taking Active Self, Pharmacy Records  atorvastatin (LIPITOR) 40 MG tablet [id number] No Take 1 tablet by mouth daily. [provider] Unknown Active   clopidogrel (PLAVIX) 75 MG tablet [id number] Yes Take 1 tablet by mouth daily. [provider] Taking Active   dicyclomine (BENTYL) 10 MG capsule [id number] Yes Take 1 capsule (10 mg total) by mouth 4 (four) times daily -  before meals and at bedtime.  Patient taking differently: Take 10-20 mg by mouth as needed for spasms.   Allwardt, Crist Infante, PA-C Taking Active Self, Pharmacy Records  diphenhydrAMINE (BENADRYL) 25 MG tablet [id number]  Take 50 mg by mouth at bedtime as needed for sleep. [provider]  Active Self, Pharmacy Records  diphenhydrAMINE HCl, Sleep, (ZZZQUIL PO) [id number] Yes Take 3 tablets by mouth at bedtime. [provider] Taking Active Self, Pharmacy Records  famotidine (PEPCID) 20 MG tablet [id number] Yes Take by mouth. [provider] Taking Active   furosemide (LASIX) 80 MG tablet [id number] Yes TAKE 1 TABLET(80 MG) BY MOUTH DAILY Georgeanna Lea, MD Taking Active Self, Pharmacy Records  gabapentin (NEURONTIN) 100 MG capsule [id number] No  Take 1 capsule (100 mg total) by mouth 3 (three) times daily.  Patient not taking: Reported on 07/23/2023   Adonis Huguenin, NP Not Taking Active    methocarbamol (ROBAXIN) 500 MG tablet [id number] Yes Take 1-2 tablets (500-1,000 mg total) by mouth every 8 (eight) hours as needed for muscle spasms (For side pain.). Dulce Sellar, NP Taking Active   potassium chloride (KLOR-CON) 10 MEQ tablet [id number] Yes Take by mouth. [provider] Taking Active   valsartan (DIOVAN) 40 MG tablet [id number] Yes Take 1 tablet (40 mg total) by mouth daily. Georgeanna Lea, MD Taking Active Self, Pharmacy Records            Patient Active Problem List   Diagnosis Date Noted   Left leg cellulitis 06/20/2023   Cellulitis 06/19/2023   Cellulitis of lower extremity 06/18/2023   Transaminitis 06/06/2023   Chest pain 06/05/2023   Cellulitis of left lower extremity 06/04/2023   Nocturnal hypoxemia 07/03/2022   Arthritis 04/16/2022   Asthma 04/16/2022   Hematoma of left lower leg    Wheezing 11/23/2021   Snoring 11/23/2021   Other secondary pulmonary hypertension (HCC) 11/23/2021   Hyponatremia 10/11/2021   Dyslipidemia 10/04/2021   Mediastinal mass 10/04/2021   Obstructive sleep apnea 08/25/2021   RUQ pain 08/19/2021   Hepatic steatosis 08/19/2021   Hepatomegaly 08/19/2021   Aortic atherosclerosis (HCC) 08/19/2021   Diverticulosis 08/19/2021   Anxiety 08/02/2021   Bipolar 1 disorder (HCC) 08/02/2021   Chronic diastolic CHF (congestive heart failure) (HCC) 08/02/2021   Depression 08/02/2021   Joint pain 08/02/2021   Screening for malignant neoplasm of colon 08/02/2021   Diastolic dysfunction 08/02/2021   Prediabetes 12/22/2019   Vitamin D insufficiency 12/22/2019   Class 3 severe obesity with serious comorbidity and body mass index (BMI) greater than or equal to 60 in adult [facility]) 12/21/2019   Essential hypertension 04/30/2019   Mild intermittent asthma without complication 04/30/2019    Conditions to be addressed/monitored per PCP order:  CHF  Care Plan : RN Care Manager Plan of Care  Updates made by Heidi Dach,  RN since 07/23/2023 12:00 AM     Problem: Health Management needs related to CHF      Long-Range Goal: Development of Plan of Care to address Health Management needs related to CHF   Start Date: 07/15/2023  Expected End Date: 10/13/2023  Note:   Current Barriers:  Chronic Disease Management support and education needs related to CHF   RNCM Clinical Goal(s):  Patient will verbalize understanding of plan for management of CHF as evidenced by patient reports take all medications exactly as prescribed and will call provider for medication related questions as evidenced by patient reports attend all scheduled medical appointments: PT evaluation on 07/23/23, [facility], Orthopedic on 07/24/23, Pulmonology on 08/27/23 and reschedule with Cardiology as evidenced by provider documentation continue to work with RN Care Manager to address care management and care coordination needs related to  CHF as evidenced by adherence to CM Team Scheduled appointments work with social worker to address  related to the management of Level of care concerns related to the management of CHF as evidenced by review of EMR and patient or Child psychotherapist report through collaboration with Medical illustrator, provider, and care team.   Interventions: Evaluation of current treatment plan related to  self management and patient's adherence to plan as established by provider   Heart Failure Interventions:  (Status:  Goal on track:  Yes.)  Long Term Goal Provided education on low sodium diet Reviewed role of diuretics in prevention of fluid overload and management of heart failure; Discussed the importance of keeping all appointments with provider Provided patient with education about the role of exercise in the management of heart failure Assessed social determinant of health barriers  Provided patient with information for medical transportation provided by [facility] [phone] Advised patient to reschedule missed  appointment with Cardiology-revisited Reviewed recent PCP note with patient and discussed recommendation of stopping Gabapentin BSW referral for assistance with PCS Collaborated with PCP requesting shower chair Reviewed medications and updated in EMR, advised patient to call for refills 3-4 days prior to running out  Patient Goals/Self-Care Activities: Take all medications as prescribed Attend all scheduled provider appointments Call provider office for new concerns or questions  Work with the social worker to address care coordination needs and will continue to work with the clinical team to address health care and disease management related needs call office if I gain more than 2 pounds in one day or 5 pounds in one week use salt in moderation watch for swelling in feet, ankles and legs every day eat more whole grains, fruits and vegetables, lean meats and healthy fats  Follow Up Plan:  Telephone follow up appointment with care management team member scheduled for:  08/13/22 at 9am      Follow Up:  Patient agrees to Care Plan and Follow-up.  Plan: The Managed Medicaid care management team will reach out to the patient again over the next 30 days.  Date/time of next scheduled RN care management/care coordination outreach:  08/14/23 at 9am  Estanislado Emms RN, BSN [place]  [facility] RN Care Coordinator [phone]

## 2023-07-24 ENCOUNTER — Ambulatory Visit (INDEPENDENT_AMBULATORY_CARE_PROVIDER_SITE_OTHER): Payer: Medicaid Other | Admitting: Family

## 2023-07-24 ENCOUNTER — Encounter: Payer: Self-pay | Admitting: Family

## 2023-07-24 DIAGNOSIS — L03116 Cellulitis of left lower limb: Secondary | ICD-10-CM

## 2023-07-24 NOTE — Progress Notes (Signed)
Office Visit Note   Patient: Christina Dominguez           Date of Birth: 1966/06/24           MRN: [id number] Visit Date: 07/24/2023              Requested by: Allwardt, Crist Infante, PA-C [address] [place],  [place] [address] PCP: Bary Leriche, PA-C  Chief Complaint  Patient presents with   Left Leg - Follow-up      HPI: The patient is a 57 year old woman who is seen in follow-up she has previously had difficulty with ulcers and cellulitis to the left lower extremity.  Denies any current issues is here for reevaluation.  Assessment & Plan: Visit Diagnoses: No diagnosis found.  Plan: Follow-up as needed she will is recommended to continue with her close monitoring bilateral lower extremities and compression garments  Follow-Up Instructions: No follow-ups on file.   Ortho Exam  Patient is alert, oriented, no adenopathy, well-dressed, normal affect, normal respiratory effort.  Stable edema which is equal bilaterally without pitting there is hemosiderin staining. On examination left lower extremity the ulcer is well-healed there is no open area she has no erythema or warmth no cellulitis  Imaging: No results found. No images are attached to the encounter.  Labs: Lab Results  Component Value Date   HGBA1C 5.8 (H) 06/19/2023   HGBA1C 5.9 (H) 08/20/2021   HGBA1C 6.0 (H) 03/22/2020   CRP 2.4 02/16/2022   CRP 1.6 (H) 10/09/2021   REPTSTATUS 06/09/2023 FINAL 06/04/2023   GRAMSTAIN NO WBC SEEN NO ORGANISMS SEEN  12/20/2021   CULT  06/04/2023    NO GROWTH 5 DAYS Performed at [facility], [address] [place], [place] [address]      Lab Results  Component Value Date   ALBUMIN 2.7 (L) 06/06/2023   ALBUMIN 3.5 06/04/2023   ALBUMIN 4.3 03/20/2022    Lab Results  Component Value Date   MG 2.1 06/21/2023   MG 2.2 06/20/2023   Lab Results  Component Value Date   VD25OH 33.5 03/22/2020   VD25OH 28.6 (L) 12/21/2019    No results found for:  "PREALBUMIN"    Latest Ref Rng & Units 06/21/2023    5:21 AM 06/20/2023    5:31 AM 06/19/2023    5:05 AM  CBC EXTENDED  WBC 4.0 - 10.5 K/uL 9.6  8.9  9.6   RBC 3.87 - 5.11 MIL/uL 4.05  3.72  3.71   Hemoglobin 12.0 - 15.0 g/dL 82.9  56.2  13.0   HCT 36.0 - 46.0 % 37.0  34.5  34.8   Platelets 150 - 400 K/uL 367  341  359      There is no height or weight on file to calculate BMI.  Orders:  No orders of the defined types were placed in this encounter.  No orders of the defined types were placed in this encounter.    Procedures: No procedures performed  Clinical Data: No additional findings.  ROS:  All other systems negative, except as noted in the HPI. Review of Systems  Objective: Vital Signs: There were no vitals taken for this visit.  Specialty Comments:  No specialty comments available.  PMFS History: Patient Active Problem List   Diagnosis Date Noted   Left leg cellulitis 06/20/2023   Cellulitis 06/19/2023   Cellulitis of lower extremity 06/18/2023   Transaminitis 06/06/2023   Chest pain 06/05/2023   Cellulitis of left  lower extremity 06/04/2023   Nocturnal hypoxemia 07/03/2022   Arthritis 04/16/2022   Asthma 04/16/2022   Hematoma of left lower leg    Wheezing 11/23/2021   Snoring 11/23/2021   Other secondary pulmonary hypertension (HCC) 11/23/2021   Hyponatremia 10/11/2021   Dyslipidemia 10/04/2021   Mediastinal mass 10/04/2021   Obstructive sleep apnea 08/25/2021   RUQ pain 08/19/2021   Hepatic steatosis 08/19/2021   Hepatomegaly 08/19/2021   Aortic atherosclerosis (HCC) 08/19/2021   Diverticulosis 08/19/2021   Anxiety 08/02/2021   Bipolar 1 disorder (HCC) 08/02/2021   Chronic diastolic CHF (congestive heart failure) (HCC) 08/02/2021   Depression 08/02/2021   Joint pain 08/02/2021   Screening for malignant neoplasm of colon 08/02/2021   Diastolic dysfunction 08/02/2021   Prediabetes 12/22/2019   Vitamin D insufficiency 12/22/2019   Class 3  severe obesity with serious comorbidity and body mass index (BMI) greater than or equal to 60 in adult [facility]) 12/21/2019   Essential hypertension 04/30/2019   Mild intermittent asthma without complication 04/30/2019   Past Medical History:  Diagnosis Date   Anxiety    Aortic atherosclerosis (HCC) 08/19/2021   Arthritis    Asthma    Bipolar 1 disorder (HCC)    Chronic diastolic CHF (congestive heart failure) (HCC)    Class 3 severe obesity with serious comorbidity and body mass index (BMI) greater than or equal to 70 in adult (HCC) 12/21/2019   Depression    Diastolic dysfunction 08/02/2021   Diverticulosis 08/19/2021   Edema of both lower extremities    Hepatic steatosis 08/19/2021   Hepatomegaly 08/19/2021   High blood pressure    Hyperlipidemia    Joint pain    Morbid obesity (HCC)    Pre-diabetes     Family History  Problem Relation Age of Onset   Heart Problems Mother        Broken heart syndrome   Hypertension Mother    Hypercholesterolemia Mother    Bone cancer Father    Testicular cancer Father    Heart attack Sister    Diabetes Brother    Colon cancer Maternal Grandfather    Dementia Paternal Grandmother     Past Surgical History:  Procedure Laterality Date   APPLICATION OF WOUND VAC Left 12/20/2021   Procedure: APPLICATION OF WOUND VAC;  Surgeon: Nadara Mustard, MD;  Location: [facility] OR;  Service: Orthopedics;  Laterality: Left;   I & D EXTREMITY Left 12/20/2021   Procedure: EXCISIONAL DEBRIDEMENT HEMATOMA LEFT LEG;  Surgeon: Nadara Mustard, MD;  Location: [facility] OR;  Service: Orthopedics;  Laterality: Left;   NO PAST SURGERIES     RIGHT/LEFT HEART CATH AND CORONARY ANGIOGRAPHY N/A 10/06/2021   Procedure: RIGHT/LEFT HEART CATH AND CORONARY ANGIOGRAPHY;  Surgeon: Corky Crafts, MD;  Location: [facility] INVASIVE CV LAB;  Service: Cardiovascular;  Laterality: N/A;   Social History   Occupational History   Occupation: Advertising account planner, work from home  Tobacco Use   Smoking  status: Never    Passive exposure: Never   Smokeless tobacco: Never  Vaping Use   Vaping status: Never Used  Substance and Sexual Activity   Alcohol use: Not Currently    Comment: Maybe 2 glasses of wine a month   Drug use: Never   Sexual activity: Not on file

## 2023-07-25 ENCOUNTER — Other Ambulatory Visit: Payer: Self-pay

## 2023-07-25 DIAGNOSIS — R2689 Other abnormalities of gait and mobility: Secondary | ICD-10-CM

## 2023-07-26 ENCOUNTER — Other Ambulatory Visit: Payer: Self-pay

## 2023-07-26 NOTE — Patient Instructions (Signed)
Visit Information  Christina Dominguez was given information about Medicaid Managed Care team care coordination services as a part of their [facility] Community Plan Medicaid benefit. [name] verbally consented to engagement with the [facility] Managed Care team.   If you are experiencing a medical emergency, please call 911 or report to your local emergency department or urgent care.   If you have a non-emergency medical problem during routine business hours, please contact your provider's office and ask to speak with a nurse.   For questions related to your [facility], please call: [phone] or visit the homepage here: [email or web address]  If you would like to schedule transportation through your [facility], please call the following number at least 2 days in advance of your appointment: [phone]   Rides for urgent appointments can also be made after hours by calling Member Services.  Call the Behavioral Health Crisis Line at [phone], at any time, 24 hours a day, 7 days a week. If you are in danger or need immediate medical attention call 911.  If you would like help to quit smoking, call 1-800-QUIT-NOW (9303876817) OR Espaol: 1-855-Djelo-Ya (7-564-332-9518) o para ms informacin haga clic aqu or Text READY to 841-660 to register via text  Ms. Arps - following are the goals we discussed in your visit today:   Goals Addressed   None      Social Worker will follow up in 30 days.   [name], [name], MHA [facility]  Managed Medicaid Social Worker [phone]   Following is a copy of your plan of care:  There are no care plans that you recently modified to display for this patient.

## 2023-07-26 NOTE — Patient Outreach (Signed)
Medicaid Managed Care Social Work Note  07/26/2023 Name:  Christina Dominguez MRN:  [id number] DOB:  1966-07-20  Christina Dominguez is an 57 y.o. year old female who is a primary patient of Allwardt, Alyssa M, PA-C.  The [facility] Managed Care Coordination team was consulted for assistance with:  Level of Care Concerns  Christina Dominguez was given information about Medicaid Managed Care Coordination team services today. Christina Dominguez Patient agreed to services and verbal consent obtained.  Engaged with patient  for by telephone forinitial visit in response to referral for case management and/or care coordination services.   Patient is participating in a Managed Medicaid Plan:  Yes  Assessments/Interventions:  Review of past medical history, allergies, medications, health status, including review of consultants reports, laboratory and other test data, was performed as part of comprehensive evaluation and provision of chronic care management services.  SDOH: (Social Drivers of Health) assessments and interventions performed: SDOH Interventions    Flowsheet Row Patient Outreach Telephone from 07/15/2023 in [place] POPULATION HEALTH DEPARTMENT Telephone from 06/11/2023 in  POPULATION HEALTH DEPARTMENT ED to Hosp-Admission (Discharged) from 10/03/2021 in [place] 2C CV PROGRESSIVE CARE  SDOH Interventions     Food Insecurity Interventions Intervention Not Indicated Intervention Not Indicated Intervention Not Indicated  Housing Interventions Other (Comment)  [Patient has discussed the issue with the apartment manager] -- Intervention Not Indicated  Transportation Interventions -- Intervention Not Indicated Intervention Not Indicated  Utilities Interventions Patient Declined -- --  Financial Strain Interventions -- -- Other (Comment)  [May need to consider disability]     BSW completed a telephone outreach with patient. She states she would like PCS.BSW explained the process to  patient and will send information over to PCP. Patient states no other resources are needed at this time.  Advanced Directives Status:  Not addressed in this encounter.  Care Plan                 Allergies  Allergen Reactions   Aspirin Nausea And Vomiting   Egg-Derived Products Swelling   Influenza Vaccines Swelling    Medications Reviewed Today   Medications were not reviewed in this encounter     Patient Active Problem List   Diagnosis Date Noted   Left leg cellulitis 06/20/2023   Cellulitis 06/19/2023   Cellulitis of lower extremity 06/18/2023   Transaminitis 06/06/2023   Chest pain 06/05/2023   Cellulitis of left lower extremity 06/04/2023   Nocturnal hypoxemia 07/03/2022   Arthritis 04/16/2022   Asthma 04/16/2022   Hematoma of left lower leg    Wheezing 11/23/2021   Snoring 11/23/2021   Other secondary pulmonary hypertension (HCC) 11/23/2021   Hyponatremia 10/11/2021   Dyslipidemia 10/04/2021   Mediastinal mass 10/04/2021   Obstructive sleep apnea 08/25/2021   RUQ pain 08/19/2021   Hepatic steatosis 08/19/2021   Hepatomegaly 08/19/2021   Aortic atherosclerosis (HCC) 08/19/2021   Diverticulosis 08/19/2021   Anxiety 08/02/2021   Bipolar 1 disorder (HCC) 08/02/2021   Chronic diastolic CHF (congestive heart failure) (HCC) 08/02/2021   Depression 08/02/2021   Joint pain 08/02/2021   Screening for malignant neoplasm of colon 08/02/2021   Diastolic dysfunction 08/02/2021   Prediabetes 12/22/2019   Vitamin D insufficiency 12/22/2019   Class 3 severe obesity with serious comorbidity and body mass index (BMI) greater than or equal to 60 in adult [facility]) 12/21/2019   Essential hypertension 04/30/2019   Mild intermittent asthma without complication 04/30/2019    Conditions to be addressed/monitored per  PCP order:   PCS Referral  There are no care plans that you recently modified to display for this patient.   Follow up:  Patient agrees to Care Plan and  Follow-up.  Plan: The Managed Medicaid care management team will reach out to the patient again over the next 30 days.  Date/time of next scheduled Social Work care management/care coordination outreach:  08/27/23 Christina Dominguez, Christina Dominguez, [facility] Health  Managed [facility] Social Worker [phone]

## 2023-07-30 DIAGNOSIS — E66813 Obesity, class 3: Secondary | ICD-10-CM | POA: Insufficient documentation

## 2023-08-01 DIAGNOSIS — I890 Lymphedema, not elsewhere classified: Secondary | ICD-10-CM | POA: Diagnosis not present

## 2023-08-01 DIAGNOSIS — I509 Heart failure, unspecified: Secondary | ICD-10-CM | POA: Diagnosis not present

## 2023-08-01 DIAGNOSIS — I700 Atherosclerosis of aorta: Secondary | ICD-10-CM | POA: Diagnosis not present

## 2023-08-01 DIAGNOSIS — G4733 Obstructive sleep apnea (adult) (pediatric): Secondary | ICD-10-CM | POA: Diagnosis not present

## 2023-08-01 DIAGNOSIS — F319 Bipolar disorder, unspecified: Secondary | ICD-10-CM | POA: Diagnosis not present

## 2023-08-01 DIAGNOSIS — R600 Localized edema: Secondary | ICD-10-CM | POA: Diagnosis not present

## 2023-08-01 DIAGNOSIS — I5032 Chronic diastolic (congestive) heart failure: Secondary | ICD-10-CM | POA: Diagnosis not present

## 2023-08-01 DIAGNOSIS — K760 Fatty (change of) liver, not elsewhere classified: Secondary | ICD-10-CM | POA: Diagnosis not present

## 2023-08-01 DIAGNOSIS — I2729 Other secondary pulmonary hypertension: Secondary | ICD-10-CM | POA: Diagnosis not present

## 2023-08-01 DIAGNOSIS — I10 Essential (primary) hypertension: Secondary | ICD-10-CM | POA: Diagnosis not present

## 2023-08-01 DIAGNOSIS — E785 Hyperlipidemia, unspecified: Secondary | ICD-10-CM | POA: Diagnosis not present

## 2023-08-02 ENCOUNTER — Telehealth: Payer: Medicaid Other | Admitting: Physician Assistant

## 2023-08-02 NOTE — Progress Notes (Signed)
Sent patient a message asking further questions.  Patient has not responded by end of shift.  Will mark no charge.

## 2023-08-07 DIAGNOSIS — I509 Heart failure, unspecified: Secondary | ICD-10-CM | POA: Diagnosis not present

## 2023-08-07 DIAGNOSIS — R531 Weakness: Secondary | ICD-10-CM | POA: Diagnosis not present

## 2023-08-07 DIAGNOSIS — G4733 Obstructive sleep apnea (adult) (pediatric): Secondary | ICD-10-CM | POA: Diagnosis not present

## 2023-08-12 NOTE — Therapy (Deleted)
 OUTPATIENT PHYSICAL THERAPY LOWER EXTREMITY EVALUATION   Patient Name: Christina Dominguez MRN: [id number] DOB:14-May-1966, 58 y.o., female Today's Date: 08/12/2023  END OF SESSION:   Past Medical History:  Diagnosis Date   Anxiety    Aortic atherosclerosis (HCC) 08/19/2021   Arthritis    Asthma    Bipolar 1 disorder (HCC)    Chronic diastolic CHF (congestive heart failure) (HCC)    Class 3 severe obesity with serious comorbidity and body mass index (BMI) greater than or equal to 70 in adult [facility]) 12/21/2019   Depression    Diastolic dysfunction 08/02/2021   Diverticulosis 08/19/2021   Edema of both lower extremities    Hepatic steatosis 08/19/2021   Hepatomegaly 08/19/2021   High blood pressure    Hyperlipidemia    Joint pain    Morbid obesity (HCC)    Pre-diabetes    Past Surgical History:  Procedure Laterality Date   APPLICATION OF WOUND VAC Left 12/20/2021   Procedure: APPLICATION OF WOUND VAC;  Surgeon: Harden Jerona GAILS, MD;  Location: [facility] OR;  Service: Orthopedics;  Laterality: Left;   I & D EXTREMITY Left 12/20/2021   Procedure: EXCISIONAL DEBRIDEMENT HEMATOMA LEFT LEG;  Surgeon: Harden Jerona GAILS, MD;  Location: [facility];  Service: Orthopedics;  Laterality: Left;   NO PAST SURGERIES     RIGHT/LEFT HEART CATH AND CORONARY ANGIOGRAPHY N/A 10/06/2021   Procedure: RIGHT/LEFT HEART CATH AND CORONARY ANGIOGRAPHY;  Surgeon: Dann Candyce RAMAN, MD;  Location: [facility] INVASIVE CV LAB;  Service: Cardiovascular;  Laterality: N/A;   Patient Active Problem List   Diagnosis Date Noted   Left leg cellulitis 06/20/2023   Cellulitis 06/19/2023   Cellulitis of lower extremity 06/18/2023   Transaminitis 06/06/2023   Chest pain 06/05/2023   Cellulitis of left lower extremity 06/04/2023   Nocturnal hypoxemia 07/03/2022   Arthritis 04/16/2022   Asthma 04/16/2022   Hematoma of left lower leg    Wheezing 11/23/2021   Snoring 11/23/2021   Other secondary pulmonary hypertension (HCC) 11/23/2021    Hyponatremia 10/11/2021   Dyslipidemia 10/04/2021   Mediastinal mass 10/04/2021   Obstructive sleep apnea 08/25/2021   RUQ pain 08/19/2021   Hepatic steatosis 08/19/2021   Hepatomegaly 08/19/2021   Aortic atherosclerosis (HCC) 08/19/2021   Diverticulosis 08/19/2021   Anxiety 08/02/2021   Bipolar 1 disorder (HCC) 08/02/2021   Chronic diastolic CHF (congestive heart failure) (HCC) 08/02/2021   Depression 08/02/2021   Joint pain 08/02/2021   Screening for malignant neoplasm of colon 08/02/2021   Diastolic dysfunction 08/02/2021   Prediabetes 12/22/2019   Vitamin D  insufficiency 12/22/2019   Class 3 severe obesity with serious comorbidity and body mass index (BMI) greater than or equal to 60 in adult [facility]) 12/21/2019   Essential hypertension 04/30/2019   Mild intermittent asthma without complication 04/30/2019    PCP: Allwardt, Mardy HERO, PA-C  REFERRING PROVIDER: Jonel Lonni SQUIBB, MD   REFERRING DIAG: ***  THERAPY DIAG:  No diagnosis found.  Rationale for Evaluation and Treatment: Rehabilitation  ONSET DATE: 07/23/23  SUBJECTIVE:   SUBJECTIVE STATEMENT: ***  PERTINENT HISTORY: L leg cellulitis, transaminitis, chest pain, nocturnal hypoxemia, arthritis, chronic diastolic CHF, Bipolar, anxiety, morbid obesity, depression, joint pain PAIN:  Are you having pain? {OPRCPAIN:27236}  PRECAUTIONS: Fall  RED FLAGS: None   WEIGHT BEARING RESTRICTIONS: No  FALLS:  Has patient fallen in last 6 months? {fallsyesno:27318}  LIVING ENVIRONMENT: Lives with: {OPRC lives with:25569::lives with their family} Lives in: {Lives in:25570} Stairs: {opstairs:27293} Has following equipment at  home: {Assistive devices:23999}  OCCUPATION: ***  PLOF: {PLOF:24004}  PATIENT GOALS: ***  NEXT MD VISIT: ***  OBJECTIVE:  Note: Objective measures were completed at Evaluation unless otherwise noted.  DIAGNOSTIC FINDINGS:  Lumbar X ray 06/27/23 Radiographs of the lumbar spine  show spondylosis worst at L4 over L5 with  grade 1 anterior listhesis L4 over L5.  No acute finding.   PATIENT SURVEYS:  {rehab surveys:24030}  COGNITION: Overall cognitive status: {cognition:24006}     SENSATION: {sensation:27233}  EDEMA:  {edema:24020}  MUSCLE LENGTH: Hamstrings: Right *** deg; Left *** deg Debby test: Right *** deg; Left *** deg  POSTURE: {posture:25561}  PALPATION: ***  LOWER EXTREMITY ROM:  {AROM/PROM:27142} ROM Right eval Left eval  Hip flexion    Hip extension    Hip abduction    Hip adduction    Hip internal rotation    Hip external rotation    Knee flexion    Knee extension    Ankle dorsiflexion    Ankle plantarflexion    Ankle inversion    Ankle eversion     (Blank rows = not tested)  LOWER EXTREMITY MMT:  MMT Right eval Left eval  Hip flexion    Hip extension    Hip abduction    Hip adduction    Hip internal rotation    Hip external rotation    Knee flexion    Knee extension    Ankle dorsiflexion    Ankle plantarflexion    Ankle inversion    Ankle eversion     (Blank rows = not tested)  LOWER EXTREMITY SPECIAL TESTS:  {LEspecialtests:26242}  FUNCTIONAL TESTS:  {Functional tests:24029}  GAIT: Distance walked: *** Assistive device utilized: {Assistive devices:23999} Level of assistance: {Levels of assistance:24026} Comments: ***                                                                                                                                TREATMENT DATE:  08/13/23  Education   PATIENT EDUCATION:  Education details: POC Person educated: Patient Education method: Explanation Education comprehension: verbalized understanding  HOME EXERCISE PROGRAM: ***  ASSESSMENT:  CLINICAL IMPRESSION: Patient is a 58 y.o. who was seen today for physical therapy evaluation and treatment for Muscle weakness after having LLE cellulitis. 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   ACTIVITY LIMITATIONS: carrying, lifting, bending, standing, squatting, stairs, transfers, bed mobility, and locomotion level  PARTICIPATION LIMITATIONS: meal prep, cleaning, laundry, driving, shopping, and community activity  PERSONAL FACTORS: 3+ comorbidities: CHF, recent leg cellulitis, morbid obesity  are also affecting patient's functional outcome.   REHAB POTENTIAL: Good  CLINICAL DECISION MAKING: Stable/uncomplicated  EVALUATION COMPLEXITY: Moderate   GOALS: Goals reviewed  with patient? Yes  SHORT TERM GOALS: Target date: 08/27/23 I with initial HEP Baseline: Goal status: INITIAL  2.  *** Baseline:  Goal status: INITIAL  3.  *** Baseline:  Goal status: INITIAL  4.  *** Baseline:  Goal status: INITIAL  5.  *** Baseline:  Goal status: INITIAL  6.  *** Baseline:  Goal status: INITIAL  LONG TERM GOALS: Target date: ***  I with final HEP Baseline:  Goal status: INITIAL  2.  Complete 5 x STS in < 20 sec Baseline:  Goal status: INITIAL  3.  Increase BLE strength to at least 4/5. Baseline:  Goal status: INITIAL  4.  Patient will be able to walk x at least 6 minutes without seated rest break. Baseline:  Goal status: INITIAL  5.  *** Baseline:  Goal status: INITIAL  6.  *** Baseline:  Goal status: INITIAL   PLAN:  PT FREQUENCY: 2x/week  PT DURATION: {rehab duration:25117}  PLANNED INTERVENTIONS: {rehab planned interventions:25118::97110-Therapeutic exercises,97530- Therapeutic [phone]- Neuromuscular re-education,97535- Self Rjmz,02859- Manual therapy}  PLAN FOR NEXT SESSION: ***   Devere CHRISTELLA Mean, DPT 08/12/2023, 2:29 PM

## 2023-08-13 ENCOUNTER — Ambulatory Visit: Payer: Medicaid Other | Admitting: Physical Therapy

## 2023-08-13 ENCOUNTER — Ambulatory Visit: Payer: Medicaid Other | Admitting: Occupational Therapy

## 2023-08-14 ENCOUNTER — Other Ambulatory Visit: Payer: Self-pay | Admitting: Cardiology

## 2023-08-14 ENCOUNTER — Other Ambulatory Visit: Payer: Self-pay | Admitting: *Deleted

## 2023-08-14 NOTE — Patient Instructions (Signed)
 Visit Information  Ms. Christina Dominguez  - as a part of your Medicaid benefit, you are eligible for care management and care coordination services at no cost or copay. I was unable to reach you by phone today but would be happy to help you with your health related needs. Please feel free to call me @ [phone].   A member of the Managed Medicaid care management team will reach out to you again over the next 7 days.   Andrea Dimes RN, BSN [place]  Value-Based [facility] Health RN Care Coordinator [phone]

## 2023-08-14 NOTE — Patient Outreach (Signed)
  Medicaid Managed Care   Unsuccessful Attempt Note   08/14/2023 Name: Christina Dominguez MRN: [id number] DOB: 1965-12-01  Referred by: Allwardt, Mardy HERO, PA-C Reason for referral : High Risk Managed Medicaid (Unsuccessful RNCM follow up telephone outreach)   An unsuccessful telephone outreach was attempted today. The patient was referred to the case management team for assistance with care management and care coordination.    Follow Up Plan: The Managed Medicaid care management team will reach out to the patient again over the next 7 days.    Andrea Dimes RN, BSN [place]  Value-Based [facility] Health RN Care Coordinator [phone]

## 2023-08-15 ENCOUNTER — Other Ambulatory Visit: Payer: Self-pay | Admitting: Cardiology

## 2023-08-15 NOTE — Telephone Encounter (Signed)
 Rx refill sent to pharmacy.

## 2023-08-20 DIAGNOSIS — Z6841 Body Mass Index (BMI) 40.0 and over, adult: Secondary | ICD-10-CM | POA: Diagnosis not present

## 2023-08-20 DIAGNOSIS — F54 Psychological and behavioral factors associated with disorders or diseases classified elsewhere: Secondary | ICD-10-CM | POA: Diagnosis not present

## 2023-08-26 NOTE — Therapy (Deleted)
OUTPATIENT OCCUPATIONAL THERAPY ORTHO EVALUATION  Patient Name: Christina Dominguez MRN: [id number] DOB:1966/06/29, 58 y.o., female Today's Date: 08/26/2023  PCP: Gerrit Friends PA-C REFERRING PROVIDER: Joen Laura MD  END OF SESSION:   Past Medical History:  Diagnosis Date   Anxiety    Aortic atherosclerosis (HCC) 08/19/2021   Arthritis    Asthma    Bipolar 1 disorder (HCC)    Chronic diastolic CHF (congestive heart failure) (HCC)    Class 3 severe obesity with serious comorbidity and body mass index (BMI) greater than or equal to 70 in adult [facility]) 12/21/2019   Depression    Diastolic dysfunction 08/02/2021   Diverticulosis 08/19/2021   Edema of both lower extremities    Hepatic steatosis 08/19/2021   Hepatomegaly 08/19/2021   High blood pressure    Hyperlipidemia    Joint pain    Morbid obesity (HCC)    Pre-diabetes    Past Surgical History:  Procedure Laterality Date   APPLICATION OF WOUND VAC Left 12/20/2021   Procedure: APPLICATION OF WOUND VAC;  Surgeon: Nadara Mustard, MD;  Location: [facility] OR;  Service: Orthopedics;  Laterality: Left;   I & D EXTREMITY Left 12/20/2021   Procedure: EXCISIONAL DEBRIDEMENT HEMATOMA LEFT LEG;  Surgeon: Nadara Mustard, MD;  Location: [facility] OR;  Service: Orthopedics;  Laterality: Left;   NO PAST SURGERIES     RIGHT/LEFT HEART CATH AND CORONARY ANGIOGRAPHY N/A 10/06/2021   Procedure: RIGHT/LEFT HEART CATH AND CORONARY ANGIOGRAPHY;  Surgeon: Corky Crafts, MD;  Location: [facility] INVASIVE CV LAB;  Service: Cardiovascular;  Laterality: N/A;   Patient Active Problem List   Diagnosis Date Noted   Left leg cellulitis 06/20/2023   Cellulitis 06/19/2023   Cellulitis of lower extremity 06/18/2023   Transaminitis 06/06/2023   Chest pain 06/05/2023   Cellulitis of left lower extremity 06/04/2023   Nocturnal hypoxemia 07/03/2022   Arthritis 04/16/2022   Asthma 04/16/2022   Hematoma of left lower leg    Wheezing 11/23/2021   Snoring  11/23/2021   Other secondary pulmonary hypertension (HCC) 11/23/2021   Hyponatremia 10/11/2021   Dyslipidemia 10/04/2021   Mediastinal mass 10/04/2021   Obstructive sleep apnea 08/25/2021   RUQ pain 08/19/2021   Hepatic steatosis 08/19/2021   Hepatomegaly 08/19/2021   Aortic atherosclerosis (HCC) 08/19/2021   Diverticulosis 08/19/2021   Anxiety 08/02/2021   Bipolar 1 disorder (HCC) 08/02/2021   Chronic diastolic CHF (congestive heart failure) (HCC) 08/02/2021   Depression 08/02/2021   Joint pain 08/02/2021   Screening for malignant neoplasm of colon 08/02/2021   Diastolic dysfunction 08/02/2021   Prediabetes 12/22/2019   Vitamin D insufficiency 12/22/2019   Class 3 severe obesity with serious comorbidity and body mass index (BMI) greater than or equal to 60 in adult [facility]) 12/21/2019   Essential hypertension 04/30/2019   Mild intermittent asthma without complication 04/30/2019    ONSET DATE: 06/08/23- referral date  REFERRING DIAG: R 53.1- weakness  THERAPY DIAG:  No diagnosis found.  Rationale for Evaluation and Treatment: Rehabilitation  SUBJECTIVE:   SUBJECTIVE STATEMENT: *** Pt accompanied by: {accompnied:27141}  PERTINENT HISTORY:  Per chart-57 y.o. female with medical history significant of chronic HFpEF, hypertension, hyperlipidemia, pulmonary hypertension, asthma, chronic bronchitis, lymphedema, severe morbid obesity, OSA, anxiety, depression, bipolar disorder, prediabetes, chronic left lower extremity wound who was recently admitted 10/29-11/2 for left lower extremity cellulitis and was discharged on cefadroxil x 10 days.  Left lower extremity Doppler ultrasound done during previous admission was negative for DVT.  Patient returned  to the hospital 06/18/23 with increasing drainage from the wound with shortness of breath.  She had desaturation with pulse ox of 80% on ambulation and was put on 2 L of nasal cannula on presentation. Pt d/c home 06/21/23.  PRECAUTIONS:  Fall  RED FLAGS: {PT Red Flags:29287}   WEIGHT BEARING RESTRICTIONS: {Yes ***/No:24003}  PAIN:  Are you having pain? {OPRCPAIN:27236}  FALLS: Has patient fallen in last 6 months? {fallsyesno:27318}  LIVING ENVIRONMENT: Lives with: {OPRC lives with:25569::"lives with their family"} Lives in: {Lives in:25570} Stairs: {opstairs:27293} Has following equipment at home: {Assistive devices:23999}  PLOF: {PLOF:24004}  PATIENT GOALS: ***  NEXT MD VISIT: ***  OBJECTIVE:  Note: Objective measures were completed at Evaluation unless otherwise noted.  HAND DOMINANCE: {MISC; OT HAND DOMINANCE:4082193296}  ADLs: {ADLs OT:31716}  FUNCTIONAL OUTCOME MEASURES: {OTFUNCTIONALMEASURES:27238}  UPPER EXTREMITY ROM:     {AROM/PROM:27142} ROM Right eval Left eval  Shoulder flexion    Shoulder abduction    Shoulder adduction    Shoulder extension    Shoulder internal rotation    Shoulder external rotation    Elbow flexion    Elbow extension    Wrist flexion    Wrist extension    Wrist ulnar deviation    Wrist radial deviation    Wrist pronation    Wrist supination    (Blank rows = not tested)  {AROM/PROM:27142} ROM Right eval Left eval  Thumb MCP (0-60)    Thumb IP (0-80)    Thumb Radial abd/add (0-55)     Thumb Palmar abd/add (0-45)     Thumb Opposition to Small Finger     Index MCP (0-90)     Index PIP (0-100)     Index DIP (0-70)      Long MCP (0-90)      Long PIP (0-100)      Long DIP (0-70)      Ring MCP (0-90)      Ring PIP (0-100)      Ring DIP (0-70)      Little MCP (0-90)      Little PIP (0-100)      Little DIP (0-70)      (Blank rows = not tested)   UPPER EXTREMITY MMT:     MMT Right eval Left eval  Shoulder flexion    Shoulder abduction    Shoulder adduction    Shoulder extension    Shoulder internal rotation    Shoulder external rotation    Middle trapezius    Lower trapezius    Elbow flexion    Elbow extension    Wrist flexion    Wrist  extension    Wrist ulnar deviation    Wrist radial deviation    Wrist pronation    Wrist supination    (Blank rows = not tested)  HAND FUNCTION: {handfunction:27230}  COORDINATION: {otcoordination:27237}  SENSATION: {sensation:27233}  EDEMA: ***  COGNITION: Overall cognitive status: {cognition:24006} Areas of impairment: {impairedcognition:27234}  OBSERVATIONS: ***   TREATMENT DATE: ***  Modalities: {OPRCMODALITIES:31717}     PATIENT EDUCATION: Education details: *** Person educated: {Person educated:25204} Education method: {Education Method:25205} Education comprehension: {Education Comprehension:25206}  HOME EXERCISE PROGRAM: ***  GOALS: Goals reviewed with patient? {yes/no:20286}  SHORT TERM GOALS: Target date: ***  *** Baseline: Goal status: INITIAL  2.  *** Baseline:  Goal status: INITIAL  3.  *** Baseline:  Goal status: INITIAL  4.  *** Baseline:  Goal status: INITIAL  5.  *** Baseline:  Goal status: INITIAL  6.  *** Baseline:  Goal status: INITIAL  LONG TERM GOALS: Target date: ***  *** Baseline:  Goal status: INITIAL  2.  *** Baseline:  Goal status: INITIAL  3.  *** Baseline:  Goal status: INITIAL  4.  *** Baseline:  Goal status: INITIAL  5.  *** Baseline:  Goal status: INITIAL  6.  *** Baseline:  Goal status: INITIAL  ASSESSMENT:  CLINICAL IMPRESSION: Pt is a 58 y.o. female with medical history significant of chronic HFpEF, hypertension, hyperlipidemia, pulmonary hypertension, asthma, chronic bronchitis, lymphedema, severe morbid obesity, OSA, anxiety, depression, bipolar disorder, prediabetes, chronic left lower extremity wound who was recently admitted 10/29-11/2 for left lower extremity cellulitis . Pt was re-hopitalized 06/18/23-06/21/23 with increaseding drainage from LE wound. Pt  presents with the following deficits:  PERFORMANCE DEFICITS: in functional skills including {OT physical skills:25468}, cognitive skills including {OT cognitive skills:25469}, and psychosocial skills including {OT psychosocial skills:25470}.   IMPAIRMENTS: are limiting patient from {OT performance deficits:25471}.   COMORBIDITIES: {Comorbidities:25485} that affects occupational performance. Patient will benefit from skilled OT to address above impairments and improve overall function.  MODIFICATION OR ASSISTANCE TO COMPLETE EVALUATION: {OT modification:25474}  OT OCCUPATIONAL PROFILE AND HISTORY: {OT PROFILE AND HISTORY:25484}  CLINICAL DECISION MAKING: {OT CDM:25475}  REHAB POTENTIAL: {rehabpotential:25112}  EVALUATION COMPLEXITY: {Evaluation complexity:25115}      PLAN:  OT FREQUENCY: {rehab frequency:25116}  OT DURATION: {rehab duration:25117}  PLANNED INTERVENTIONS: {OT Interventions:25467}  RECOMMENDED OTHER SERVICES: ***  CONSULTED AND AGREED WITH PLAN OF CARE: {WUJ:81191}  PLAN FOR NEXT SESSION: ***   Keene Breath, OT 08/26/2023, 8:43 AM

## 2023-08-27 ENCOUNTER — Other Ambulatory Visit: Payer: Self-pay

## 2023-08-27 ENCOUNTER — Ambulatory Visit (HOSPITAL_BASED_OUTPATIENT_CLINIC_OR_DEPARTMENT_OTHER): Payer: Medicaid Other | Admitting: Pulmonary Disease

## 2023-08-27 ENCOUNTER — Encounter (HOSPITAL_BASED_OUTPATIENT_CLINIC_OR_DEPARTMENT_OTHER): Payer: Self-pay | Admitting: Pulmonary Disease

## 2023-08-27 ENCOUNTER — Ambulatory Visit: Payer: Medicaid Other | Admitting: Occupational Therapy

## 2023-08-27 ENCOUNTER — Ambulatory Visit: Payer: Medicaid Other | Admitting: Physical Therapy

## 2023-08-27 VITALS — BP 132/72 | HR 76

## 2023-08-27 DIAGNOSIS — G4733 Obstructive sleep apnea (adult) (pediatric): Secondary | ICD-10-CM | POA: Diagnosis not present

## 2023-08-27 NOTE — Patient Instructions (Signed)
Mild OSA with nocturnal hypoxemia and apnea PLMS Pulmonary hypertension --ORDER home sleep study --CONTINUE 2L O2 via [place]   Intermittent asthma - asymptomatic --CONTINUE Albuterola as needed

## 2023-08-27 NOTE — Progress Notes (Signed)
Subjective:   PATIENT ID: [name] GENDER: female DOB: 1966-05-13, MRN: [id number]   HPI  Chief Complaint  Patient presents with   Sleep Apnea    Reason for Visit: F/u  Ms. Christina Dominguez is a 58 year old female never smoker with morbid obesity, clinical asthma, hx of chronic bronchitis (no recent issues in 6 years), HTN, HLD, moderate pulmonary hypertension, chronic diastolic heart failure, OSA who presents for follow-up for concern for OSA.  Initial consult She was incidentally diagnosed with COVID in March 2023 during her hospitalization for heart failure. Since then she reports her shortness of breath and wheezing has remained. She has been more active with water aerobics and changed her diet. Wheezing at night has resolved.   While in the hospital she reports episodes of witnessed apnea by staff and was started on CPAP. Tolerated it well and reported improved sleep quality. At home she would wake up every two hours due to nocturia. Now has fatigue off the machine.  01/30/22 She reports developing left lower extremity edema in the last 24 hours. She reports debridement of left leg hematoma on 12/20/21 on the same limb. She was walking however last week had GI illness and was not as ambulatory. Since starting her oxygen, her quality of sleep is improved however she had better control on CPAP while in the hospital. This helps with her activity level and energy. She has some wheezing a few times a week. Occasional cough. Shortness of breath with moderate exertion. Does not limit activity.  07/03/22 Since our last visit she reports her breathing has improved. Has rarely needed her albuterol. Denies wheezing, cough. Shortness of breath with exertion but attributes to her inactivity after surgery. Wears oxygen nightly and feels this helps. She continues to have nocturia so unable to stay asleep. Wakes up every few hours. Has not been contacted about CPAP  supplies.  08/27/23 Since our last visit it has been >1 year. She has been able to obtain CPAP due to insurance denials over the year. She reports she is sleepier and more fatigued. She has nocturnal awakenings and nocturia every 2-3 hours. She is not getting good quality sleep. She reports shortness of breath has improved and not on maintenance inhalers. She has had less swelling since being on lasix 80 mg daily and this has improved her breathing. 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   Past Medical History:  Diagnosis Date   Anxiety    Aortic atherosclerosis (HCC) 08/19/2021   Arthritis    Asthma    Bipolar 1 disorder (HCC)    Chronic diastolic CHF (congestive heart failure) (HCC)    Class 3 severe obesity with serious comorbidity and body mass index (BMI) greater than or equal to 70 in adult [facility]) 12/21/2019   Depression    Diastolic dysfunction 08/02/2021   Diverticulosis 08/19/2021   Edema of both lower extremities    Hepatic steatosis 08/19/2021   Hepatomegaly 08/19/2021   High blood pressure    Hyperlipidemia    Joint pain    Morbid obesity (HCC)    Pre-diabetes      Family History  Problem Relation Age of Onset   Heart Problems Mother        Broken heart syndrome   Hypertension Mother    Hypercholesterolemia Mother    Bone cancer Father  Testicular cancer Father    Heart attack Sister    Diabetes Brother    Colon cancer Maternal Grandfather    Dementia Paternal Grandmother      Social History   Occupational History   Occupation: Advertising account planner, work from home  Tobacco Use   Smoking status: Never    Passive exposure: Never   Smokeless tobacco: Never  Vaping Use   Vaping status: Never Used  Substance and Sexual Activity   Alcohol use: Not Currently    Comment: Maybe 2 glasses of wine a month   Drug use: Never   Sexual activity:  Not on file    Allergies  Allergen Reactions   Aspirin Nausea And Vomiting   Egg-Derived Products Swelling   Influenza Vaccines Swelling     Outpatient Medications Prior to Visit  Medication Sig Dispense Refill   acetaminophen (TYLENOL) 650 MG CR tablet Take 1,300 mg by mouth every 8 (eight) hours as needed for pain.     albuterol (VENTOLIN HFA) 108 (90 Base) MCG/ACT inhaler Inhale 2 puffs into the lungs every 6 (six) hours as needed for wheezing or shortness of breath. 8 g 6   atorvastatin (LIPITOR) 40 MG tablet Take 1 tablet by mouth daily.     clopidogrel (PLAVIX) 75 MG tablet Take 1 tablet by mouth daily.     dicyclomine (BENTYL) 10 MG capsule Take 1 capsule (10 mg total) by mouth 4 (four) times daily -  before meals and at bedtime. (Patient taking differently: Take 10-20 mg by mouth as needed for spasms.) 120 capsule 2   diphenhydrAMINE (BENADRYL) 25 MG tablet Take 50 mg by mouth at bedtime as needed for sleep.     diphenhydrAMINE HCl, Sleep, (ZZZQUIL PO) Take 3 tablets by mouth at bedtime.     famotidine (PEPCID) 20 MG tablet Take by mouth.     furosemide (LASIX) 80 MG tablet Take 1 tablet (80 mg total) by mouth daily. 1rst attempt, patient needs and appt for additional refills 30 tablet 0   gabapentin (NEURONTIN) 100 MG capsule Take 1 capsule (100 mg total) by mouth 3 (three) times daily. 90 capsule 1   methocarbamol (ROBAXIN) 500 MG tablet Take 1-2 tablets (500-1,000 mg total) by mouth every 8 (eight) hours as needed for muscle spasms (For side pain.). 60 tablet 0   potassium chloride (KLOR-CON) 10 MEQ tablet Take by mouth.     potassium chloride SA (KLOR-CON M) 20 MEQ tablet Take 1 tablet (20 mEq total) by mouth daily. Pt needs office visit before future refills. 30 tablet 0   valsartan (DIOVAN) 40 MG tablet Take 1 tablet (40 mg total) by mouth daily. 90 tablet 1   No facility-administered medications prior to visit.    Review of Systems  Constitutional:  Positive for  malaise/fatigue. Negative for chills, diaphoresis, fever and weight loss.  HENT:  Negative for congestion.   Respiratory:  Positive for shortness of breath. Negative for cough, hemoptysis, sputum production and wheezing.   Cardiovascular:  Negative for chest pain, palpitations and leg swelling.     Objective:   Vitals:   08/27/23 1533  BP: 132/72  Pulse: 76  SpO2: 94%    SpO2: 94 %  Physical Exam: General: Well-appearing, no acute distress HENT: [place], AT Eyes: EOMI, no scleral icterus Respiratory: Clear to auscultation bilaterally.  No crackles, wheezing or rales Cardiovascular: RRR, -M/R/G, no JVD Extremities:-Edema,-tenderness Neuro: AAO x4, CNII-XII grossly intact Psych: Normal mood, normal affect  Data Reviewed:  Imaging:  CTA 10/03/21 - No PE. Dilation of main pulmonary artery suggestive of PH. 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 Pulmonary artery enlargement. CXR 10/09/2021-bibasilar atelectasis CTA 06/18/23 - Neg for PE, bilateral mosaic attenuation, right paraspinal posterior mediastinal mass 3 x 2.1 cm, previously 4.5 x 3 cm characterized as benign cystic lesion on MRI 10/05/21  PFT: 01/30/22 FVC 1.24 (42%) FEV1 1.02 (44%) Ratio 75  DLCO 70% Interpretation: Severe obstructive defect based on FEV1/SVC. Partial bronchodilator response however not significant. This does not preclude the benefit of bronchodilators. Mildly reduced DLCO.   Labs: CBC    Component Value Date/Time   WBC 9.6 06/21/2023 0521   RBC 4.05 06/21/2023 0521   HGB 11.7 (L) 06/21/2023 0521   HCT 37.0 06/21/2023 0521   PLT 367 06/21/2023 0521   MCV 91.4 06/21/2023 0521   MCH 28.9 06/21/2023 0521   MCHC 31.6 06/21/2023 0521   RDW 15.1 06/21/2023 0521   LYMPHSABS 1.8  06/18/2023 1654   MONOABS 0.6 06/18/2023 1654   EOSABS 0.2 06/18/2023 1654   BASOSABS 0.1 06/18/2023 1654   Absolute eos 10/03/21 - 200  Sleep: Split night 12/28/21 - Mild OSA and PLMS with AHI 7.7 Nadir SpO2 75%. Recommend therapy including CPAP, oral appliance, or surgical assessment.    Assessment & Plan:   Discussion: 58 year old female never smoker with morbid obesity, asthma, hx of chronic bronchitis, HTN, HLD, moderate PH secondary to OSA, chronic diastolic heart failure who presents for follow-up. Persistent symptoms related to uncontrolled sleep apnea. Compliant with home O2. Will re-order new sleep study due to new insurance.  Mild OSA with nocturnal hypoxemia and apnea PLMS Pulmonary hypertension --ORDER home sleep study --CONTINUE 2L O2 via [place]  Intermittent asthma - asymptomatic --CONTINUE Albuterola as needed  Health Maintenance Immunization History  Administered Date(s) Administered   Janssen (J&J) SARS-COV-2 Vaccination 11/16/2019   PNEUMOCOCCAL CONJUGATE-20 06/21/2023   Tdap 04/01/2019   Zoster Recombinant(Shingrix) 10/26/2021    Orders Placed This Encounter  Procedures   Home sleep test    Standing Status:   Future    Expiration Date:   08/26/2024    Where should this test be performed::   [facility]   No orders of the defined types were placed in this encounter.   Return in about 3 months (around 11/25/2023).   I have spent a total time of 30-minutes on the day of the appointment including chart review, data review, collecting history, coordinating care and discussing medical diagnosis and plan with the patient/family. Past medical history, allergies, medications were reviewed. Pertinent imaging, labs and tests included in this note have been reviewed and interpreted independently by me.  Christina Preston Mechele Collin, MD [facility] 08/27/2023 4:26 PM

## 2023-08-27 NOTE — Therapy (Deleted)
OUTPATIENT PHYSICAL THERAPY LOWER EXTREMITY EVALUATION   Patient Name: Christina Dominguez MRN: [id number] DOB:1966/06/13, 59 y.o., female Today's Date: 08/27/2023  END OF SESSION:   Past Medical History:  Diagnosis Date   Anxiety    Aortic atherosclerosis (HCC) 08/19/2021   Arthritis    Asthma    Bipolar 1 disorder (HCC)    Chronic diastolic CHF (congestive heart failure) (HCC)    Class 3 severe obesity with serious comorbidity and body mass index (BMI) greater than or equal to 70 in adult [facility]) 12/21/2019   Depression    Diastolic dysfunction 08/02/2021   Diverticulosis 08/19/2021   Edema of both lower extremities    Hepatic steatosis 08/19/2021   Hepatomegaly 08/19/2021   High blood pressure    Hyperlipidemia    Joint pain    Morbid obesity (HCC)    Pre-diabetes    Past Surgical History:  Procedure Laterality Date   APPLICATION OF WOUND VAC Left 12/20/2021   Procedure: APPLICATION OF WOUND VAC;  Surgeon: Nadara Mustard, MD;  Location: [facility] OR;  Service: Orthopedics;  Laterality: Left;   I & D EXTREMITY Left 12/20/2021   Procedure: EXCISIONAL DEBRIDEMENT HEMATOMA LEFT LEG;  Surgeon: Nadara Mustard, MD;  Location: [facility] OR;  Service: Orthopedics;  Laterality: Left;   NO PAST SURGERIES     RIGHT/LEFT HEART CATH AND CORONARY ANGIOGRAPHY N/A 10/06/2021   Procedure: RIGHT/LEFT HEART CATH AND CORONARY ANGIOGRAPHY;  Surgeon: Corky Crafts, MD;  Location: [facility] INVASIVE CV LAB;  Service: Cardiovascular;  Laterality: N/A;   Patient Active Problem List   Diagnosis Date Noted   Left leg cellulitis 06/20/2023   Cellulitis 06/19/2023   Cellulitis of lower extremity 06/18/2023   Transaminitis 06/06/2023   Chest pain 06/05/2023   Cellulitis of left lower extremity 06/04/2023   Nocturnal hypoxemia 07/03/2022   Arthritis 04/16/2022   Asthma 04/16/2022   Hematoma of left lower leg    Wheezing 11/23/2021   Snoring 11/23/2021   Other secondary pulmonary hypertension (HCC) 11/23/2021    Hyponatremia 10/11/2021   Dyslipidemia 10/04/2021   Mediastinal mass 10/04/2021   Obstructive sleep apnea 08/25/2021   RUQ pain 08/19/2021   Hepatic steatosis 08/19/2021   Hepatomegaly 08/19/2021   Aortic atherosclerosis (HCC) 08/19/2021   Diverticulosis 08/19/2021   Anxiety 08/02/2021   Bipolar 1 disorder (HCC) 08/02/2021   Chronic diastolic CHF (congestive heart failure) (HCC) 08/02/2021   Depression 08/02/2021   Joint pain 08/02/2021   Screening for malignant neoplasm of colon 08/02/2021   Diastolic dysfunction 08/02/2021   Prediabetes 12/22/2019   Vitamin D insufficiency 12/22/2019   Class 3 severe obesity with serious comorbidity and body mass index (BMI) greater than or equal to 60 in adult [facility]) 12/21/2019   Essential hypertension 04/30/2019   Mild intermittent asthma without complication 04/30/2019    PCP: Allwardt, Crist Infante, PA-C  REFERRING PROVIDER: Alberteen Sam, MD   REFERRING DIAG: R53.1  THERAPY DIAG:  No diagnosis found.  Rationale for Evaluation and Treatment: Rehabilitation  ONSET DATE: 07/23/23 (date of referral)  SUBJECTIVE:   SUBJECTIVE STATEMENT: ***  PERTINENT HISTORY: L leg cellulitis, transaminitis, chest pain, nocturnal hypoxemia, arthritis, chronic diastolic CHF, Bipolar, anxiety, morbid obesity, depression, joint pain PAIN:  Are you having pain? {OPRCPAIN:27236}  PRECAUTIONS: Fall  RED FLAGS: None   WEIGHT BEARING RESTRICTIONS: No  FALLS:  Has patient fallen in last 6 months? {fallsyesno:27318}  LIVING ENVIRONMENT: Lives with: {OPRC lives with:25569::"lives with their family"} Lives in: {Lives in:25570} Stairs: {opstairs:27293} Has  following equipment at home: {Assistive devices:23999}  OCCUPATION: ***  PLOF: {PLOF:24004}  PATIENT GOALS: ***  NEXT MD VISIT: ***  OBJECTIVE:  Note: Objective measures were completed at Evaluation unless otherwise noted.  DIAGNOSTIC FINDINGS:  Lumbar X ray 06/27/23 Radiographs  of the lumbar spine show spondylosis worst at L4 over L5 with  grade 1 anterior listhesis L4 over L5.  No acute finding.   PATIENT SURVEYS:  {rehab surveys:24030}  COGNITION: Overall cognitive status: {cognition:24006}     SENSATION: {sensation:27233}  EDEMA:  {edema:24020}  MUSCLE LENGTH: Hamstrings: Right *** deg; Left *** deg Maisie Fus test: Right *** deg; Left *** deg  POSTURE: {posture:25561}  PALPATION: ***  LOWER EXTREMITY ROM:  {AROM/PROM:27142} ROM Right eval Left eval  Hip flexion    Hip extension    Hip abduction    Hip adduction    Hip internal rotation    Hip external rotation    Knee flexion    Knee extension    Ankle dorsiflexion    Ankle plantarflexion    Ankle inversion    Ankle eversion     (Blank rows = not tested)  LOWER EXTREMITY MMT:  MMT Right eval Left eval  Hip flexion    Hip extension    Hip abduction    Hip adduction    Hip internal rotation    Hip external rotation    Knee flexion    Knee extension    Ankle dorsiflexion    Ankle plantarflexion    Ankle inversion    Ankle eversion     (Blank rows = not tested)  LOWER EXTREMITY SPECIAL TESTS:  {LEspecialtests:26242}  FUNCTIONAL TESTS:  {Functional tests:24029}  GAIT: Distance walked: *** Assistive device utilized: {Assistive devices:23999} Level of assistance: {Levels of assistance:24026} Comments: ***                                                                                                                                TREATMENT DATE:  08/13/23  Education   PATIENT EDUCATION:  Education details: POC Person educated: Patient Education method: Explanation Education comprehension: verbalized understanding  HOME EXERCISE PROGRAM: ***  ASSESSMENT:  CLINICAL IMPRESSION: Patient is a 58 y.o. who was seen today for physical therapy evaluation and treatment for Muscle weakness after having LLE cellulitis. 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   ACTIVITY LIMITATIONS: carrying, lifting, bending, standing, squatting, stairs, transfers, bed mobility, and locomotion level  PARTICIPATION LIMITATIONS: meal prep, cleaning, laundry, driving, shopping, and community activity  PERSONAL FACTORS: 3+ comorbidities: CHF, recent leg cellulitis, morbid obesity  are also affecting patient's functional outcome.   REHAB POTENTIAL: Good  CLINICAL DECISION MAKING: Stable/uncomplicated  EVALUATION COMPLEXITY: Moderate  GOALS: Goals reviewed with patient? Yes  SHORT TERM GOALS: Target date: 08/27/23 I with initial HEP Baseline: Goal status: INITIAL  2.  *** Baseline:  Goal status: INITIAL  3.  *** Baseline:  Goal status: INITIAL  4.  *** Baseline:  Goal status: INITIAL  5.  *** Baseline:  Goal status: INITIAL  6.  *** Baseline:  Goal status: INITIAL  LONG TERM GOALS: Target date: ***  I with final HEP Baseline:  Goal status: INITIAL  2.  Complete 5 x STS in < 20 sec Baseline:  Goal status: INITIAL  3.  Increase BLE strength to at least 4/5. Baseline:  Goal status: INITIAL  4.  Patient will be able to walk x at least 6 minutes without seated rest break. Baseline:  Goal status: INITIAL  5.  *** Baseline:  Goal status: INITIAL  6.  *** Baseline:  Goal status: INITIAL   PLAN:  PT FREQUENCY: 2x/week  PT DURATION: {rehab duration:25117}  PLANNED INTERVENTIONS: {rehab planned interventions:25118::"97110-Therapeutic exercises","97530- Therapeutic [phone]- Neuromuscular re-education","97535- Self GEXB","28413- Manual therapy"}  PLAN FOR NEXT  SESSION: ***   Milon Dethloff April Ma L Kelden Lavallee, PT, DPT 08/27/2023, 12:16 PM

## 2023-08-27 NOTE — Patient Instructions (Signed)
 Visit Information  Ms. Christina Dominguez was given information about Medicaid Managed Care team care coordination services as a part of their [facility] Community Plan Medicaid benefit. Christina Dominguez verbally consented to engagement with the [facility] Managed Care team.   If you are experiencing a medical emergency, please call 911 or report to your local emergency department or urgent care.   If you have a non-emergency medical problem during routine business hours, please contact your provider's office and ask to speak with a nurse.   For questions related to your [facility], please call: [phone] or visit the homepage here: [email or web address]  If you would like to schedule transportation through your [facility], please call the following number at least 2 days in advance of your appointment: [phone]   Rides for urgent appointments can also be made after hours by calling Member Services.  Call the Behavioral Health Crisis Line at [phone], at any time, 24 hours a day, 7 days a week. If you are in danger or need immediate medical attention call 911.  If you would like help to quit smoking, call 1-800-QUIT-NOW (9303876817) OR Espaol: 1-855-Djelo-Ya (7-564-332-9518) o para ms informacin haga clic aqu or Text READY to 841-660 to register via text  Ms. Arps - following are the goals we discussed in your visit today:   Goals Addressed   None      Social Worker will follow up in 30 days.   Christina Dominguez, Christina Dominguez, MHA [facility]  Managed Medicaid Social Worker [phone]   Following is a copy of your plan of care:  There are no care plans that you recently modified to display for this patient.

## 2023-08-27 NOTE — Patient Outreach (Signed)
Medicaid Managed Care Social Work Note  08/27/2023 Name:  Christina Dominguez MRN:  [id number] DOB:  1966-04-03  Christina Dominguez is an 58 y.o. year old female who is a primary patient of Allwardt, Alyssa M, PA-C.  The [facility] Managed Care Coordination team was consulted for assistance with:  Level of Care Concerns  Christina Dominguez was given information about Medicaid Managed Care Coordination team services today. Christina Dominguez Patient agreed to services and verbal consent obtained.  Engaged with patient  for by telephone forfollow up visit in response to referral for case management and/or care coordination services.   Patient is participating in a Managed Medicaid Plan:  Yes  Assessments/Interventions:  Review of past medical history, allergies, medications, health status, including review of consultants reports, laboratory and other test data, was performed as part of comprehensive evaluation and provision of chronic care management services.  SDOH: (Social Drivers of Health) assessments and interventions performed: SDOH Interventions    Flowsheet Row Patient Outreach Telephone from 07/15/2023 in [place] POPULATION HEALTH DEPARTMENT Telephone from 06/11/2023 in [place] POPULATION HEALTH DEPARTMENT ED to Hosp-Admission (Discharged) from 10/03/2021 in [place] 2C CV PROGRESSIVE CARE  SDOH Interventions     Food Insecurity Interventions Intervention Not Indicated Intervention Not Indicated Intervention Not Indicated  Housing Interventions Other (Comment)  [Patient has discussed the issue with the apartment manager] -- Intervention Not Indicated  Transportation Interventions -- Intervention Not Indicated Intervention Not Indicated  Utilities Interventions Patient Declined -- --  Financial Strain Interventions -- -- Other (Comment)  [May need to consider disability]      BSW completed a telephone outreach with patient, she states she has not heard anything from PCP or insurance  provider about PCS. She states she has not received her Cpap machine or a sleep study. Patient states she is no longer working and does not have a need for any resources at this time. BSW will send PCP a message to make sure PCS form was received via fax. Advanced Directives Status:  Not addressed in this encounter.  Care Plan                 Allergies  Allergen Reactions   Aspirin Nausea And Vomiting   Egg-Derived Products Swelling   Influenza Vaccines Swelling    Medications Reviewed Today   Medications were not reviewed in this encounter     Patient Active Problem List   Diagnosis Date Noted   Left leg cellulitis 06/20/2023   Cellulitis 06/19/2023   Cellulitis of lower extremity 06/18/2023   Transaminitis 06/06/2023   Chest pain 06/05/2023   Cellulitis of left lower extremity 06/04/2023   Nocturnal hypoxemia 07/03/2022   Arthritis 04/16/2022   Asthma 04/16/2022   Hematoma of left lower leg    Wheezing 11/23/2021   Snoring 11/23/2021   Other secondary pulmonary hypertension (HCC) 11/23/2021   Hyponatremia 10/11/2021   Dyslipidemia 10/04/2021   Mediastinal mass 10/04/2021   Obstructive sleep apnea 08/25/2021   RUQ pain 08/19/2021   Hepatic steatosis 08/19/2021   Hepatomegaly 08/19/2021   Aortic atherosclerosis (HCC) 08/19/2021   Diverticulosis 08/19/2021   Anxiety 08/02/2021   Bipolar 1 disorder (HCC) 08/02/2021   Chronic diastolic CHF (congestive heart failure) (HCC) 08/02/2021   Depression 08/02/2021   Joint pain 08/02/2021   Screening for malignant neoplasm of colon 08/02/2021   Diastolic dysfunction 08/02/2021   Prediabetes 12/22/2019   Vitamin D insufficiency 12/22/2019   Class 3 severe obesity with serious comorbidity and body  mass index (BMI) greater than or equal to 60 in adult [facility]) 12/21/2019   Essential hypertension 04/30/2019   Mild intermittent asthma without complication 04/30/2019    Conditions to be addressed/monitored per PCP order:    PCS  There are no care plans that you recently modified to display for this patient.   Follow up:  Patient agrees to Care Plan and Follow-up.  Plan: The Managed Medicaid care management team will reach out to the patient again over the next 30 days.  Date/time of next scheduled Social Work care management/care coordination outreach:  09/27/23  [name], [name], [facility]  Managed [facility] Social Worker [phone]

## 2023-08-30 DIAGNOSIS — I10 Essential (primary) hypertension: Secondary | ICD-10-CM | POA: Diagnosis not present

## 2023-08-30 DIAGNOSIS — E785 Hyperlipidemia, unspecified: Secondary | ICD-10-CM | POA: Diagnosis not present

## 2023-08-30 DIAGNOSIS — G4733 Obstructive sleep apnea (adult) (pediatric): Secondary | ICD-10-CM | POA: Diagnosis not present

## 2023-08-30 DIAGNOSIS — I2729 Other secondary pulmonary hypertension: Secondary | ICD-10-CM | POA: Diagnosis not present

## 2023-08-30 DIAGNOSIS — F319 Bipolar disorder, unspecified: Secondary | ICD-10-CM | POA: Diagnosis not present

## 2023-08-30 DIAGNOSIS — I890 Lymphedema, not elsewhere classified: Secondary | ICD-10-CM | POA: Diagnosis not present

## 2023-08-30 DIAGNOSIS — I700 Atherosclerosis of aorta: Secondary | ICD-10-CM | POA: Diagnosis not present

## 2023-08-30 DIAGNOSIS — R7303 Prediabetes: Secondary | ICD-10-CM | POA: Diagnosis not present

## 2023-08-30 DIAGNOSIS — K760 Fatty (change of) liver, not elsewhere classified: Secondary | ICD-10-CM | POA: Diagnosis not present

## 2023-08-30 DIAGNOSIS — I5032 Chronic diastolic (congestive) heart failure: Secondary | ICD-10-CM | POA: Diagnosis not present

## 2023-08-30 DIAGNOSIS — Z6841 Body Mass Index (BMI) 40.0 and over, adult: Secondary | ICD-10-CM | POA: Diagnosis not present

## 2023-09-02 ENCOUNTER — Other Ambulatory Visit: Payer: Self-pay | Admitting: *Deleted

## 2023-09-02 NOTE — Patient Outreach (Signed)
Medicaid Managed Care   Nurse Care Manager Note  09/02/2023 Name:  Christina Dominguez MRN:  [id number] DOB:  November 09, 1965  Christina Dominguez is an 58 y.o. year old female who is a primary patient of Allwardt, Alyssa M, PA-C.  The [facility] Managed Care Coordination team was consulted for assistance with:    CHF  Christina Dominguez was given information about Medicaid Managed Care Coordination team services today. Christina Dominguez Patient agreed to services and verbal consent obtained.  Engaged with patient by telephone for follow up visit in response to provider referral for case management and/or care coordination services.   Patient is participating in a Managed Medicaid Plan:  Yes  Assessments/Interventions:  Review of past medical history, allergies, medications, health status, including review of consultants reports, laboratory and other test data, was performed as part of comprehensive evaluation and provision of chronic care management services.  SDOH (Social Drivers of Health) assessments and interventions performed: SDOH Interventions    Flowsheet Row Patient Outreach Telephone from 09/02/2023 in [place] POPULATION HEALTH DEPARTMENT Patient Outreach Telephone from 07/15/2023 in [place] POPULATION HEALTH DEPARTMENT Telephone from 06/11/2023 in [place] POPULATION HEALTH DEPARTMENT ED to Hosp-Admission (Discharged) from 10/03/2021 in [place] 2C CV PROGRESSIVE CARE  SDOH Interventions      Food Insecurity Interventions Intervention Not Indicated Intervention Not Indicated Intervention Not Indicated Intervention Not Indicated  Housing Interventions Intervention Not Indicated Other (Comment)  [Patient has discussed the issue with the apartment manager] -- Intervention Not Indicated  Transportation Interventions Payor Benefit -- Intervention Not Indicated Intervention Not Indicated  Utilities Interventions Intervention Not Indicated Patient Declined -- --  Financial Strain  Interventions -- -- -- Other (Comment)  [May need to consider disability]       Care Plan  Allergies  Allergen Reactions   Aspirin Nausea And Vomiting   Egg-Derived Products Swelling   Influenza Vaccines Swelling    Medications Reviewed Today     Reviewed by Heidi Dach, RN (Registered Nurse) on 09/02/23 at 1045  Med List Status: <None>   Medication Order Taking? Sig Documenting Provider Last Dose Status Informant  acetaminophen (TYLENOL) 650 MG CR tablet [id number] Yes Take 1,300 mg by mouth every 8 (eight) hours as needed for pain. [provider] Taking Active Self, Pharmacy Records  albuterol (VENTOLIN HFA) 108 (90 Base) MCG/ACT inhaler [id number] Yes Inhale 2 puffs into the lungs every 6 (six) hours as needed for wheezing or shortness of breath. Luciano Cutter, MD Taking Active Self, Pharmacy Records  atorvastatin (LIPITOR) 40 MG tablet [id number] Yes Take 1 tablet by mouth daily. [provider] Taking Active   clopidogrel (PLAVIX) 75 MG tablet [id number] Yes Take 1 tablet by mouth daily. [provider] Taking Active   dicyclomine (BENTYL) 10 MG capsule [id number] Yes Take 1 capsule (10 mg total) by mouth 4 (four) times daily -  before meals and at bedtime.  Patient taking differently: Take 10-20 mg by mouth as needed for spasms.   Allwardt, Crist Infante, PA-C Taking Active Self, Pharmacy Records  diphenhydrAMINE (BENADRYL) 25 MG tablet [id number] Yes Take 50 mg by mouth at bedtime as needed for sleep. [provider] Taking Active Self, Pharmacy Records  diphenhydrAMINE HCl, Sleep, (ZZZQUIL PO) [id number] No Take 3 tablets by mouth at bedtime.  Patient not taking: Reported on 09/02/2023   [provider] Not Taking Active Self, Pharmacy Records  famotidine (PEPCID) 20 MG tablet [id number] Yes Take by mouth. [provider] Taking  Active   furosemide (LASIX) 80 MG tablet [id number] Yes Take 1 tablet (80 mg total) by mouth daily.  1rst attempt, patient needs and appt for additional refills Georgeanna Lea, MD Taking Active   gabapentin (NEURONTIN) 100 MG capsule [id number] No Take 1 capsule (100 mg total) by mouth 3 (three) times daily.  Patient not taking: Reported on 09/02/2023   Adonis Huguenin, NP Not Taking Active   methocarbamol (ROBAXIN) 500 MG tablet [id number] No Take 1-2 tablets (500-1,000 mg total) by mouth every 8 (eight) hours as needed for muscle spasms (For side pain.).  Patient not taking: Reported on 09/02/2023   Dulce Sellar, NP Not Taking Active   potassium chloride (KLOR-CON) 10 MEQ tablet [id number] No Take by mouth.  Patient not taking: Reported on 09/02/2023   [provider] Not Taking Active   potassium chloride SA (KLOR-CON M) 20 MEQ tablet [id number] Yes Take 1 tablet (20 mEq total) by mouth daily. Pt needs office visit before future refills. Georgeanna Lea, MD Taking Active   valsartan (DIOVAN) 40 MG tablet [id number] Yes Take 1 tablet (40 mg total) by mouth daily. Georgeanna Lea, MD Taking Active Self, Pharmacy Records            Patient Active Problem List   Diagnosis Date Noted   Left leg cellulitis 06/20/2023   Cellulitis 06/19/2023   Cellulitis of lower extremity 06/18/2023   Transaminitis 06/06/2023   Chest pain 06/05/2023   Cellulitis of left lower extremity 06/04/2023   Nocturnal hypoxemia 07/03/2022   Arthritis 04/16/2022   Asthma 04/16/2022   Hematoma of left lower leg    Wheezing 11/23/2021   Snoring 11/23/2021   Other secondary pulmonary hypertension (HCC) 11/23/2021   Hyponatremia 10/11/2021   Dyslipidemia 10/04/2021   Mediastinal mass 10/04/2021   Obstructive sleep apnea 08/25/2021   RUQ pain 08/19/2021   Hepatic steatosis 08/19/2021   Hepatomegaly 08/19/2021   Aortic atherosclerosis (HCC) 08/19/2021   Diverticulosis 08/19/2021   Anxiety 08/02/2021   Bipolar 1 disorder (HCC) 08/02/2021   Chronic diastolic CHF (congestive heart failure)  (HCC) 08/02/2021   Depression 08/02/2021   Joint pain 08/02/2021   Screening for malignant neoplasm of colon 08/02/2021   Diastolic dysfunction 08/02/2021   Prediabetes 12/22/2019   Vitamin D insufficiency 12/22/2019   Class 3 severe obesity with serious comorbidity and body mass index (BMI) greater than or equal to 60 in adult [facility]) 12/21/2019   Essential hypertension 04/30/2019   Mild intermittent asthma without complication 04/30/2019    Conditions to be addressed/monitored per PCP order:  CHF  Care Plan : RN Care Manager Plan of Care  Updates made by Heidi Dach, RN since 09/02/2023 12:00 AM     Problem: Health Management needs related to CHF      Long-Range Goal: Development of Plan of Care to address Health Management needs related to CHF   Start Date: 07/15/2023  Expected End Date: 10/13/2023  Note:   Current Barriers:  Chronic Disease Management support and education needs related to CHF   RNCM Clinical Goal(s):  Patient will verbalize understanding of plan for management of CHF as evidenced by patient reports take all medications exactly as prescribed and will call provider for medication related questions as evidenced by patient reports attend all scheduled medical appointments: PT/OT Evaluation 09/10/23 and reschedule with Cardiology as evidenced by provider documentation continue to work with RN Care Manager to address care management and care coordination needs related to  CHF as  evidenced by adherence to CM Team Scheduled appointments work with Child psychotherapist to address  related to the management of Level of care concerns related to the management of CHF as evidenced by review of EMR and patient or Child psychotherapist report through collaboration with Medical illustrator, provider, and care team.   Interventions: Evaluation of current treatment plan related to  self management and patient's adherence to plan as established by provider   Heart Failure Interventions:  (Status:   Goal on track:  Yes.) Long Term Goal Provided education on low sodium diet Reviewed role of diuretics in prevention of fluid overload and management of heart failure; Discussed the importance of keeping all appointments with provider Provided patient with education about the role of exercise in the management of heart failure Assessed social determinant of health barriers  Provided patient with information for medical transportation provided by [facility] [phone] Advised patient to reschedule missed appointment with Cardiology-revisited BSW referral for assistance with PCS-follow up scheduled on 09/27/23 Collaborated with PCP requesting shower chair-discussed contacting [employer] [phone] Contact Pulmonology if sleep study not scheduled by the first of next week.  Patient Goals/Self-Care Activities: Take all medications as prescribed Attend all scheduled provider appointments Call provider office for new concerns or questions  Work with the social worker to address care coordination needs and will continue to work with the clinical team to address health care and disease management related needs call office if I gain more than 2 pounds in one day or 5 pounds in one week use salt in moderation watch for swelling in feet, ankles and legs every day eat more whole grains, fruits and vegetables, lean meats and healthy fats  Follow Up Plan:  Telephone follow up appointment with care management team member scheduled for:   at 9am      Follow Up:  Patient agrees to Care Plan and Follow-up.  Plan: The Managed Medicaid care management team will reach out to the patient again over the next 30 days.  Date/time of next scheduled RN care management/care coordination outreach:  10/03/23 at 10:30am  Estanislado Emms RN, BSN [place]  Value-Based [facility] Health RN Care Manager [phone]

## 2023-09-02 NOTE — Patient Instructions (Signed)
Visit Information  Christina Dominguez was given information about Medicaid Managed Care team care coordination services as a part of their [facility] Community Plan Medicaid benefit. Christina Dominguez verbally consented to engagement with the [facility] Managed Care team.   If you are experiencing a medical emergency, please call 911 or report to your local emergency department or urgent care.   If you have a non-emergency medical problem during routine business hours, please contact your provider's office and ask to speak with a nurse.   For questions related to your [facility], please call: [phone] or visit the homepage here: [email or web address]  If you would like to schedule transportation through your [facility], please call the following number at least 2 days in advance of your appointment: [phone]   Rides for urgent appointments can also be made after hours by calling Member Services.  Call the Behavioral Health Crisis Line at [phone], at any time, 24 hours a day, 7 days a week. If you are in danger or need immediate medical attention call 911.  If you would like help to quit smoking, call 1-800-QUIT-NOW (512-524-6352) OR Espaol: 1-855-Djelo-Ya (2-536-644-0347) o para ms informacin haga clic aqu or Text READY to 425-956 to register via text  Christina Dominguez,   Please see education materials related to HF provided by MyChart link.  Patient verbalizes understanding of instructions and care plan provided today and agrees to view in MyChart. Active MyChart status and patient understanding of how to access instructions and care plan via MyChart confirmed with patient.     Telephone follow up appointment with Managed Medicaid care management team member scheduled for:10/03/23 at 10:30am  Estanislado Emms RN, BSN [place]  [facility]  Health RN Care Manager [phone]   Following is a copy of your plan of care:  Care Plan : RN Care Manager Plan of Care  Updates made by Heidi Dach, RN since 09/02/2023 12:00 AM     Problem: Health Management needs related to CHF      Long-Range Goal: Development of Plan of Care to address Health Management needs related to CHF   Start Date: 07/15/2023  Expected End Date: 10/13/2023  Note:   Current Barriers:  Chronic Disease Management support and education needs related to CHF   RNCM Clinical Goal(s):  Patient will verbalize understanding of plan for management of CHF as evidenced by patient reports take all medications exactly as prescribed and will call provider for medication related questions as evidenced by patient reports attend all scheduled medical appointments: PT/OT Evaluation 09/10/23 and reschedule with Cardiology as evidenced by provider documentation continue to work with RN Care Manager to address care management and care coordination needs related to  CHF as evidenced by adherence to CM Team Scheduled appointments work with social worker to address  related to the management of Level of care concerns related to the management of CHF as evidenced by review of EMR and patient or social worker report through collaboration with Medical illustrator, provider, and care team.   Interventions: Evaluation of current treatment plan related to  self management and patient's adherence to plan as established by provider   Heart Failure Interventions:  (Status:  Goal on track:  Yes.) Long Term Goal Provided education on low sodium diet Reviewed role of diuretics in prevention of fluid overload and management of heart failure; Discussed the importance of keeping all appointments with provider Provided patient with education about  the role of exercise in the management of heart failure Assessed social determinant of health barriers  Provided patient with information for medical  transportation provided by [facility] [phone] Advised patient to reschedule missed appointment with Cardiology-revisited BSW referral for assistance with PCS-follow up scheduled on 09/27/23 Collaborated with PCP requesting shower chair-discussed contacting [employer] [phone] Contact Pulmonology if sleep study not scheduled by the first of next week.  Patient Goals/Self-Care Activities: Take all medications as prescribed Attend all scheduled provider appointments Call provider office for new concerns or questions  Work with the social worker to address care coordination needs and will continue to work with the clinical team to address health care and disease management related needs call office if I gain more than 2 pounds in one day or 5 pounds in one week use salt in moderation watch for swelling in feet, ankles and legs every day eat more whole grains, fruits and vegetables, lean meats and healthy fats  Follow Up Plan:  Telephone follow up appointment with care management team member scheduled for:   at 9am

## 2023-09-07 DIAGNOSIS — R531 Weakness: Secondary | ICD-10-CM | POA: Diagnosis not present

## 2023-09-07 DIAGNOSIS — G4733 Obstructive sleep apnea (adult) (pediatric): Secondary | ICD-10-CM | POA: Diagnosis not present

## 2023-09-07 DIAGNOSIS — I509 Heart failure, unspecified: Secondary | ICD-10-CM | POA: Diagnosis not present

## 2023-09-10 ENCOUNTER — Encounter: Payer: Self-pay | Admitting: Occupational Therapy

## 2023-09-10 ENCOUNTER — Ambulatory Visit: Payer: Medicaid Other | Admitting: Occupational Therapy

## 2023-09-10 ENCOUNTER — Ambulatory Visit: Payer: Medicaid Other | Attending: Family Medicine

## 2023-09-10 DIAGNOSIS — R2689 Other abnormalities of gait and mobility: Secondary | ICD-10-CM | POA: Diagnosis present

## 2023-09-10 DIAGNOSIS — R2681 Unsteadiness on feet: Secondary | ICD-10-CM

## 2023-09-10 DIAGNOSIS — M6281 Muscle weakness (generalized): Secondary | ICD-10-CM | POA: Diagnosis present

## 2023-09-10 DIAGNOSIS — R5381 Other malaise: Secondary | ICD-10-CM | POA: Insufficient documentation

## 2023-09-10 DIAGNOSIS — M79604 Pain in right leg: Secondary | ICD-10-CM | POA: Insufficient documentation

## 2023-09-10 DIAGNOSIS — Z7409 Other reduced mobility: Secondary | ICD-10-CM | POA: Insufficient documentation

## 2023-09-10 DIAGNOSIS — M79605 Pain in left leg: Secondary | ICD-10-CM | POA: Diagnosis present

## 2023-09-10 DIAGNOSIS — R269 Unspecified abnormalities of gait and mobility: Secondary | ICD-10-CM | POA: Diagnosis present

## 2023-09-10 NOTE — Therapy (Addendum)
 OUTPATIENT PHYSICAL THERAPY LOWER EXTREMITY EVALUATION   Patient Name: Christina Dominguez MRN: [id number] DOB:08-05-1966, 58 y.o., female Today's Date: 09/10/2023  END OF SESSION:   Past Medical History:  Diagnosis Date   Anxiety    Aortic atherosclerosis (HCC) 08/19/2021   Arthritis    Asthma    Bipolar 1 disorder (HCC)    Chronic diastolic CHF (congestive heart failure) (HCC)    Class 3 severe obesity with serious comorbidity and body mass index (BMI) greater than or equal to 70 in adult [facility]) 12/21/2019   Depression    Diastolic dysfunction 08/02/2021   Diverticulosis 08/19/2021   Edema of both lower extremities    Hepatic steatosis 08/19/2021   Hepatomegaly 08/19/2021   High blood pressure    Hyperlipidemia    Joint pain    Morbid obesity (HCC)    Pre-diabetes    Past Surgical History:  Procedure Laterality Date   APPLICATION OF WOUND VAC Left 12/20/2021   Procedure: APPLICATION OF WOUND VAC;  Surgeon: Harden Jerona GAILS, MD;  Location: [facility] OR;  Service: Orthopedics;  Laterality: Left;   I & D EXTREMITY Left 12/20/2021   Procedure: EXCISIONAL DEBRIDEMENT HEMATOMA LEFT LEG;  Surgeon: Harden Jerona GAILS, MD;  Location: [facility] OR;  Service: Orthopedics;  Laterality: Left;   NO PAST SURGERIES     RIGHT/LEFT HEART CATH AND CORONARY ANGIOGRAPHY N/A 10/06/2021   Procedure: RIGHT/LEFT HEART CATH AND CORONARY ANGIOGRAPHY;  Surgeon: Dann Candyce RAMAN, MD;  Location: [facility] INVASIVE CV LAB;  Service: Cardiovascular;  Laterality: N/A;   Patient Active Problem List   Diagnosis Date Noted   Left leg cellulitis 06/20/2023   Cellulitis 06/19/2023   Cellulitis of lower extremity 06/18/2023   Transaminitis 06/06/2023   Chest pain 06/05/2023   Cellulitis of left lower extremity 06/04/2023   Nocturnal hypoxemia 07/03/2022   Arthritis 04/16/2022   Asthma 04/16/2022   Hematoma of left lower leg    Wheezing 11/23/2021   Snoring 11/23/2021   Other secondary pulmonary hypertension (HCC) 11/23/2021    Hyponatremia 10/11/2021   Dyslipidemia 10/04/2021   Mediastinal mass 10/04/2021   Obstructive sleep apnea 08/25/2021   RUQ pain 08/19/2021   Hepatic steatosis 08/19/2021   Hepatomegaly 08/19/2021   Aortic atherosclerosis (HCC) 08/19/2021   Diverticulosis 08/19/2021   Anxiety 08/02/2021   Bipolar 1 disorder (HCC) 08/02/2021   Chronic diastolic CHF (congestive heart failure) (HCC) 08/02/2021   Depression 08/02/2021   Joint pain 08/02/2021   Screening for malignant neoplasm of colon 08/02/2021   Diastolic dysfunction 08/02/2021   Prediabetes 12/22/2019   Vitamin D  insufficiency 12/22/2019   Class 3 severe obesity with serious comorbidity and body mass index (BMI) greater than or equal to 60 in adult [facility]) 12/21/2019   Essential hypertension 04/30/2019   Mild intermittent asthma without complication 04/30/2019    PCP: Dr. Mardy Dyke  REFERRING PROVIDER: Dr. Lonni Dalton  REFERRING DIAG: Weakness  THERAPY DIAG:  No diagnosis found.  Rationale for Evaluation and Treatment: Rehabilitation  ONSET DATE: 06/08/23 - referral date  SUBJECTIVE:   SUBJECTIVE STATEMENT: Pt stated feeling good today. When the order was first placed she was bedridden. Now able to walk utilizing a roller walker. She was able to use a SPC. Unable to stand for long periods of time. Not able to cook due to unable to stand for a prolonged period of time. Last hospitalization was for a back injury, feels it is almost fully healed. Weakness from being bedridden for about 5 wks. Reported no  pain in back.   PERTINENT HISTORY: Aortic atherosclerosis Bipolar CHF Severe Obesity Bilateral LE Edema Hypertension Hyperlipidemia Prediabetes Asthma arthritis PSH: heart cath, L leg excisional debridement hematoma PAIN:  Are you having pain? Yes: NPRS scale: 5-8/10  Pain location: right hip Pain description: pulling, sharp pain Aggravating factors: prolonged standing Relieving factors:  [place] Described feeling pain in knees and ankles generally  PRECAUTIONS: Fall  RED FLAGS: None   WEIGHT BEARING RESTRICTIONS: No  FALLS:  Has patient fallen in last 6 months? 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   NEXT MD VISIT: none scheduled   OBJECTIVE:  Note: Objective measures were completed at Evaluation unless otherwise noted.  DIAGNOSTIC FINDINGS: none recent   COGNITION: Overall cognitive status: Within functional limits for tasks assessed     SENSATION: WFL  POSTURE: rounded shoulders, increased lumbar lordosis, anterior pelvic tilt, and flexed trunk   LOWER EXTREMITY ROM: resting pain 4/10, 8/10 with movements  Active ROM Right eval Left eval  Hip flexion [facility]  Hip extension    Hip abduction    Hip adduction    Hip internal rotation    Hip external rotation    Knee flexion [facility] WFL  Knee extension [facility]  Ankle dorsiflexion [facility] WFL  Ankle plantarflexion [facility] WFL  Ankle inversion    Ankle eversion     (Blank rows = not tested)  LOWER EXTREMITY MMT:  MMT Right eval Left eval  Hip flexion 4-* 3+*  Hip extension    Hip abduction 4* 4*  Hip adduction 4* 4*  Hip internal rotation    Hip external rotation    Knee flexion    Knee extension 3+* 3+*  Ankle dorsiflexion 5 5  Ankle plantarflexion    Ankle inversion    Ankle eversion     (Blank rows = not tested)  FUNCTIONAL TESTS:  30 seconds chair stand test Timed up and go (TUG): 26.37 30 second STS: 4    GAIT: Distance walked: in clinic distances Assistive device utilized: Environmental Consultant - 2 wheeled Level of assistance: Modified independence                                                                                                    TREATMENT DATE: 09/10/23 EVAL    PATIENT EDUCATION:  Education details: POC and HEP Person educated: Patient Education method: Medical Illustrator Education comprehension: verbalized understanding and returned demonstration  HOME EXERCISE PROGRAM: Access Code: [email or web address] URL: [email or web address] Date: 09/10/2023 Prepared by: Almetta Fam Exercises - Seated March  - 1 x daily - 7 x weekly - 2 sets - 10 reps - Seated Long Arc Quad  - 1 x daily - 7 x weekly - 2 sets - 10 reps - Seated Hip Adduction Isometrics with Ball  - 1 x daily -  7 x weekly - 2 sets - 10 reps - Seated Heel Raise  - 1 x daily - 7 x weekly - 2 sets - 10 reps   ASSESSMENT:  CLINICAL IMPRESSION: Patient is a 58  y.o. female who was seen today for physical therapy evaluation and treatment for generalized weakness. She has a constant pain of 4/10 which jumps up to an 8/10 with any movement or resisted exercise. Some of her strength may be limited due to the pain she feels with movement. When assessing her knee extension MMT, I could feel that there was no cartilage in her knees. She has an overall decreased activity tolerance. She had some shortness of breath after the TUG test.  She currently lives alone and has to do her transfers on her own. Her goals are to get back to her PLOF which includes ambulating with a cane. Pt will benefit from skilled PT to improve strength, cardiovascular endurance, and muscular endurance.  OBJECTIVE IMPAIRMENTS: Abnormal gait, cardiopulmonary status limiting activity, decreased activity tolerance, decreased balance, decreased endurance, difficulty walking, decreased strength, improper body mechanics, postural dysfunction, obesity, and pain.   ACTIVITY LIMITATIONS: carrying, lifting, bending, sitting, standing, squatting, and stairs  PARTICIPATION LIMITATIONS: meal prep, cleaning, laundry, driving, shopping, community activity,  occupation, and yard work  PERSONAL FACTORS: Age, Behavior pattern, Education, Fitness, and Time since onset of injury/illness/exacerbation are also affecting patient's functional outcome.   REHAB POTENTIAL: Good  CLINICAL DECISION MAKING: Stable/uncomplicated  EVALUATION COMPLEXITY: Low   GOALS: Goals reviewed with patient? Yes  SHORT TERM GOALS: Target date: 10/22/23  Patient will be independent with initial HEP. Baseline: given 09/10/23 Goal status: INITIAL   LONG TERM GOALS: Target date:12/03/23  Patient will be independent with advanced/ongoing HEP to improve outcomes and carryover.  Baseline:  Goal status: INITIAL  2.  Patient will report at least 75% improvement in bilateral LE pain to improve QOL. Baseline: 4/10 resting, 8/10 with movement (09/10/23) Goal status: INITIAL  3.  Patient will increase standing tolerance to 10 mins or longer to be able to complete household chores. Baseline: unable to do Goal status: INITIAL  4.  Patient will be able to ambulate 200' with LRAD and normal gait pattern without increased pain to access community.  Baseline: TBD Goal status: INITIAL  5.  Patient will perform 7 STS in 30 seconds Baseline: 4 (09/10/23) Goal status: INITIAL   PLAN:  PT FREQUENCY: 2x/week  PT DURATION: 12 weeks  PLANNED INTERVENTIONS: 97110-Therapeutic exercises, 97530- Therapeutic activity, [id number]- Neuromuscular re-education, 97535- Self Care, [id number]- Manual therapy, [phone]- Gait training, [id number]- Aquatic Therapy, Stair training, DME instructions, Cryotherapy, and Moist heat  PLAN FOR NEXT SESSION: bilateral LE strengthening, improve muscular and cardiovascular endurance   Larraine Nordmann, Student-PT 09/10/2023, 1:33 PM

## 2023-09-10 NOTE — Therapy (Signed)
 OUTPATIENT OCCUPATIONAL THERAPY ORTHO EVALUATION  Patient Name: Christina Dominguez MRN: [id number] DOB:Mar 05, 1966, 58 y.o., female Today's Date: 09/10/2023  PCP: Kathrene Mess PA-C REFERRING PROVIDER: Jonel Bruckner MD  END OF SESSION:  OT End of Session - 09/10/23 1521     Visit Number 1    Number of Visits 4    Date for OT Re-Evaluation 11/12/23    Authorization Time Period 9    OT Start Time 1450    OT Stop Time 1525    OT Time Calculation (min) 35 min    Activity Tolerance Patient tolerated treatment well    Behavior During Therapy [facility] for tasks assessed/performed             Past Medical History:  Diagnosis Date   Anxiety    Aortic atherosclerosis (HCC) 08/19/2021   Arthritis    Asthma    Bipolar 1 disorder (HCC)    Chronic diastolic CHF (congestive heart failure) (HCC)    Class 3 severe obesity with serious comorbidity and body mass index (BMI) greater than or equal to 70 in adult [facility]) 12/21/2019   Depression    Diastolic dysfunction 08/02/2021   Diverticulosis 08/19/2021   Edema of both lower extremities    Hepatic steatosis 08/19/2021   Hepatomegaly 08/19/2021   High blood pressure    Hyperlipidemia    Joint pain    Morbid obesity (HCC)    Pre-diabetes    Past Surgical History:  Procedure Laterality Date   APPLICATION OF WOUND VAC Left 12/20/2021   Procedure: APPLICATION OF WOUND VAC;  Surgeon: Harden Jerona GAILS, MD;  Location: [facility] OR;  Service: Orthopedics;  Laterality: Left;   I & D EXTREMITY Left 12/20/2021   Procedure: EXCISIONAL DEBRIDEMENT HEMATOMA LEFT LEG;  Surgeon: Harden Jerona GAILS, MD;  Location: [facility] OR;  Service: Orthopedics;  Laterality: Left;   NO PAST SURGERIES     RIGHT/LEFT HEART CATH AND CORONARY ANGIOGRAPHY N/A 10/06/2021   Procedure: RIGHT/LEFT HEART CATH AND CORONARY ANGIOGRAPHY;  Surgeon: Dann Candyce RAMAN, MD;  Location: [facility] INVASIVE CV LAB;  Service: Cardiovascular;  Laterality: N/A;   Patient Active Problem List   Diagnosis  Date Noted   Left leg cellulitis 06/20/2023   Cellulitis 06/19/2023   Cellulitis of lower extremity 06/18/2023   Transaminitis 06/06/2023   Chest pain 06/05/2023   Cellulitis of left lower extremity 06/04/2023   Nocturnal hypoxemia 07/03/2022   Arthritis 04/16/2022   Asthma 04/16/2022   Hematoma of left lower leg    Wheezing 11/23/2021   Snoring 11/23/2021   Other secondary pulmonary hypertension (HCC) 11/23/2021   Hyponatremia 10/11/2021   Dyslipidemia 10/04/2021   Mediastinal mass 10/04/2021   Obstructive sleep apnea 08/25/2021   RUQ pain 08/19/2021   Hepatic steatosis 08/19/2021   Hepatomegaly 08/19/2021   Aortic atherosclerosis (HCC) 08/19/2021   Diverticulosis 08/19/2021   Anxiety 08/02/2021   Bipolar 1 disorder (HCC) 08/02/2021   Chronic diastolic CHF (congestive heart failure) (HCC) 08/02/2021   Depression 08/02/2021   Joint pain 08/02/2021   Screening for malignant neoplasm of colon 08/02/2021   Diastolic dysfunction 08/02/2021   Prediabetes 12/22/2019   Vitamin D  insufficiency 12/22/2019   Class 3 severe obesity with serious comorbidity and body mass index (BMI) greater than or equal to 60 in adult [facility]) 12/21/2019   Essential hypertension 04/30/2019   Mild intermittent asthma without complication 04/30/2019    ONSET DATE: 06/08/23- referral date  REFERRING DIAG: R 53.1- weakness  THERAPY DIAG:  Muscle weakness (generalized) -  Plan: Ot plan of care cert/re-cert  Other abnormalities of gait and mobility - Plan: Ot plan of care cert/re-cert  Unsteadiness on feet - Plan: Ot plan of care cert/re-cert  Rationale for Evaluation and Treatment: Rehabilitation  SUBJECTIVE:   SUBJECTIVE STATEMENT: Pt reports she lives alone Pt accompanied by: self  PERTINENT HISTORY:  Per chart-57 y.o. female with medical history significant of chronic HFpEF, hypertension, hyperlipidemia, pulmonary hypertension, asthma, chronic bronchitis, lymphedema, severe morbid obesity, OSA,  anxiety, depression, bipolar disorder, prediabetes, chronic left lower extremity wound who was recently admitted 10/29-11/2 for left lower extremity cellulitis and was discharged on cefadroxil  x 10 days.  Left lower extremity Doppler ultrasound done during previous admission was negative for DVT.  Patient returned to the hospital 06/18/23 with increasing drainage from the wound with shortness of breath.  She had desaturation with pulse ox of 80% on ambulation and was put on 2 L of nasal cannula on presentation. Pt d/c home 06/21/23.  PRECAUTIONS: Fall  RED FLAGS: None   WEIGHT BEARING RESTRICTIONS: No  PAIN:  Are you having pain? No  FALLS: Has patient fallen in last 6 months? No  LIVING ENVIRONMENT: Lives with: lives alone Lives in: House/apartment Stairs: No Has following equipment at home: Single point cane, bed side commode, and rollator  PLOF: Independent  PATIENT GOALS: increase I  NEXT MD VISIT: unknown  OBJECTIVE:  Note: Objective measures were completed at Evaluation unless otherwise noted.  HAND DOMINANCE: Right  ADLs: Upper body dressing: mod I Lower body dressing: perfroms with difficulty and increased time required  Toileting: mod I uses BSC over toilet Bathing: has transferred to tub but therapist recommends pt gets a tub transfer bench  Tub shower transfers: has transferred     UPPER EXTREMITY ROM:     Active ROM Right eval Left eval  Shoulder flexion 105 100  Shoulder abduction 90 100  Shoulder adduction    Shoulder extension    Shoulder internal rotation    Shoulder external rotation    Elbow flexion    Elbow extension    Wrist flexion    Wrist extension    Wrist ulnar deviation    Wrist radial deviation    Wrist pronation    Wrist supination    (Blank rows = not tested)     UPPER EXTREMITY MMT:     MMT Right eval Left eval  Shoulder flexion 4-/5 3+/5  Shoulder abduction    Shoulder adduction    Shoulder extension    Shoulder  internal rotation    Shoulder external rotation    Middle trapezius    Lower trapezius    Elbow flexion 4+/5 4/+/5  Elbow extension 4/5 4+/5  Wrist flexion    Wrist extension    Wrist ulnar deviation    Wrist radial deviation    Wrist pronation    Wrist supination    (Blank rows = not tested)  HAND FUNCTION: Grip strength: Right: 55 lbs; Left: 65 lbs    SENSATION: WFL    COGNITION: Overall cognitive status: Within functional limits for tasks assessed   OBSERVATIONS: Pleasant female agreeable to participate in OT   TREATMENT DATE: 09/10/23- eval completed, education initated regarding AE/ DME. Information provided regarding tub transfer bench  PATIENT EDUCATION: Education details: role of OT, potential goals,  Person educated: Patient Education method: Chief Technology Officer Education comprehension: verbalized understanding  HOME EXERCISE PROGRAM: n/a  GOALS: Goals reviewed with patient? Yes    LONG TERM GOALS: Target date: 11/12/23  I with HEP for UE strength Baseline: dependent Goal status: INITIAL  2.  pt will verbalize understanding of adapted strategies for ADLS / IADLs to maximize safety and I with daily acitivities  Baseline: dependent Goal status: INITIAL  3.  Pt will verbalize understanding of energy conservation techniques Baseline: dependent Goal status: INITIAL 4. Pt will increase bilateral shoulder felxion A/ROM by 5* for iimproved functional reach.  Baeline; see above  Goal status:inital    ASSESSMENT:  CLINICAL IMPRESSION: Pt is a 58 y.o. female with medical history significant of chronic HFpEF, hypertension, hyperlipidemia, pulmonary hypertension, asthma, chronic bronchitis, lymphedema, severe morbid obesity, OSA, anxiety, depression, bipolar disorder, prediabetes, chronic left lower extremity wound who was  recently admitted 10/29-11/2 for left lower extremity cellulitis . Pt was re-hopitalized 06/18/23-06/21/23 with increaseding drainage from LE wound. Pt can benefit from skilled occupation al therapy to address perfromance deficits below in order to maximize pt's safety and I with daily activities.  PERFORMANCE DEFICITS: in functional skills including ADLs, IADLs, strength, flexibility, mobility, balance, endurance, decreased knowledge of precautions, decreased knowledge of use of DME, and UE functional use, , and psychosocial skills including coping strategies, environmental adaptation, habits, interpersonal interactions, and routines and behaviors.   IMPAIRMENTS: are limiting patient from ADLs, IADLs, rest and sleep, work, play, leisure, and social participation.   COMORBIDITIES: may have co-morbidities  that affects occupational performance. Patient will benefit from skilled OT to address above impairments and improve overall function.  MODIFICATION OR ASSISTANCE TO COMPLETE EVALUATION: No modification of tasks or assist necessary to complete an evaluation.  OT OCCUPATIONAL PROFILE AND HISTORY: Detailed assessment: Review of records and additional review of physical, cognitive, psychosocial history related to current functional performance.  CLINICAL DECISION MAKING: LOW - limited treatment options, no task modification necessary  REHAB POTENTIAL: Good  EVALUATION COMPLEXITY: Low      PLAN:  OT FREQUENCY:  3visits plus eval  OT DURATION: 8 weeks  PLANNED INTERVENTIONS: 97168 OT Re-evaluation, 97535 self care/ADL training, 02889 therapeutic exercise, 97530 therapeutic activity, 97140 manual therapy, 97035 ultrasound, 97018 paraffin, 02989 moist heat, 97010 cryotherapy, 97034 contrast bath, passive range of motion, balance training, functional mobility training, energy conservation, coping strategies training, patient/family education, and DME and/or AE instructions  RECOMMENDED OTHER  SERVICES: PT  CONSULTED AND AGREED WITH PLAN OF CARE: Patient  PLAN FOR NEXT SESSION: inital HEP, energy conservation   Nikala Walsworth, OT 09/10/2023, 4:35 PM

## 2023-09-12 ENCOUNTER — Other Ambulatory Visit: Payer: Self-pay | Admitting: Cardiology

## 2023-09-12 ENCOUNTER — Telehealth: Payer: Self-pay

## 2023-09-12 NOTE — Telephone Encounter (Signed)
 Copied from CRM [id number]. Topic: General - Other >> Sep 11, 2023  4:29 PM Burnard DEL wrote: Reason for CRM: patient called in stating that an order was placed to [employer] for bariatric shower chair but the order needs to be changed to a transfer bench.  Please see pt call note regarding order placed.

## 2023-09-12 NOTE — Telephone Encounter (Signed)
 Please contact patient and advise appt is needed; PCP approves VV if better for patient

## 2023-09-17 DIAGNOSIS — Z713 Dietary counseling and surveillance: Secondary | ICD-10-CM | POA: Diagnosis not present

## 2023-09-18 ENCOUNTER — Encounter (HOSPITAL_BASED_OUTPATIENT_CLINIC_OR_DEPARTMENT_OTHER): Payer: Self-pay | Admitting: Pulmonary Disease

## 2023-09-18 ENCOUNTER — Ambulatory Visit: Payer: Medicaid Other

## 2023-09-19 ENCOUNTER — Ambulatory Visit: Payer: Medicaid Other | Admitting: Physical Therapy

## 2023-09-19 ENCOUNTER — Ambulatory Visit: Payer: Medicaid Other

## 2023-09-19 DIAGNOSIS — M79604 Pain in right leg: Secondary | ICD-10-CM

## 2023-09-19 DIAGNOSIS — M6281 Muscle weakness (generalized): Secondary | ICD-10-CM | POA: Diagnosis not present

## 2023-09-19 DIAGNOSIS — R5381 Other malaise: Secondary | ICD-10-CM

## 2023-09-19 DIAGNOSIS — R2681 Unsteadiness on feet: Secondary | ICD-10-CM

## 2023-09-19 DIAGNOSIS — R269 Unspecified abnormalities of gait and mobility: Secondary | ICD-10-CM

## 2023-09-19 DIAGNOSIS — R2689 Other abnormalities of gait and mobility: Secondary | ICD-10-CM

## 2023-09-19 DIAGNOSIS — Z7409 Other reduced mobility: Secondary | ICD-10-CM

## 2023-09-19 NOTE — Therapy (Signed)
OUTPATIENT PHYSICAL THERAPY LOWER EXTREMITY TREATMENT   Patient Name: Christina Dominguez MRN: [id number] DOB:02-18-1966, 58 y.o., female Today's Date: 09/19/2023  END OF SESSION:   Past Medical History:  Diagnosis Date   Anxiety    Aortic atherosclerosis (HCC) 08/19/2021   Arthritis    Asthma    Bipolar 1 disorder (HCC)    Chronic diastolic CHF (congestive heart failure) (HCC)    Class 3 severe obesity with serious comorbidity and body mass index (BMI) greater than or equal to 70 in adult [facility]) 12/21/2019   Depression    Diastolic dysfunction 08/02/2021   Diverticulosis 08/19/2021   Edema of both lower extremities    Hepatic steatosis 08/19/2021   Hepatomegaly 08/19/2021   High blood pressure    Hyperlipidemia    Joint pain    Morbid obesity (HCC)    Pre-diabetes    Past Surgical History:  Procedure Laterality Date   APPLICATION OF WOUND VAC Left 12/20/2021   Procedure: APPLICATION OF WOUND VAC;  Surgeon: Nadara Mustard, MD;  Location: [facility] OR;  Service: Orthopedics;  Laterality: Left;   I & D EXTREMITY Left 12/20/2021   Procedure: EXCISIONAL DEBRIDEMENT HEMATOMA LEFT LEG;  Surgeon: Nadara Mustard, MD;  Location: [facility] OR;  Service: Orthopedics;  Laterality: Left;   NO PAST SURGERIES     RIGHT/LEFT HEART CATH AND CORONARY ANGIOGRAPHY N/A 10/06/2021   Procedure: RIGHT/LEFT HEART CATH AND CORONARY ANGIOGRAPHY;  Surgeon: Corky Crafts, MD;  Location: [facility] INVASIVE CV LAB;  Service: Cardiovascular;  Laterality: N/A;   Patient Active Problem List   Diagnosis Date Noted   Left leg cellulitis 06/20/2023   Cellulitis 06/19/2023   Cellulitis of lower extremity 06/18/2023   Transaminitis 06/06/2023   Chest pain 06/05/2023   Cellulitis of left lower extremity 06/04/2023   Nocturnal hypoxemia 07/03/2022   Arthritis 04/16/2022   Asthma 04/16/2022   Hematoma of left lower leg    Wheezing 11/23/2021   Snoring 11/23/2021   Other secondary pulmonary hypertension (HCC) 11/23/2021    Hyponatremia 10/11/2021   Dyslipidemia 10/04/2021   Mediastinal mass 10/04/2021   Obstructive sleep apnea 08/25/2021   RUQ pain 08/19/2021   Hepatic steatosis 08/19/2021   Hepatomegaly 08/19/2021   Aortic atherosclerosis (HCC) 08/19/2021   Diverticulosis 08/19/2021   Anxiety 08/02/2021   Bipolar 1 disorder (HCC) 08/02/2021   Chronic diastolic CHF (congestive heart failure) (HCC) 08/02/2021   Depression 08/02/2021   Joint pain 08/02/2021   Screening for malignant neoplasm of colon 08/02/2021   Diastolic dysfunction 08/02/2021   Prediabetes 12/22/2019   Vitamin D insufficiency 12/22/2019   Class 3 severe obesity with serious comorbidity and body mass index (BMI) greater than or equal to 60 in adult [facility]) 12/21/2019   Essential hypertension 04/30/2019   Mild intermittent asthma without complication 04/30/2019    PCP: Dr. Ila Mcgill  REFERRING PROVIDER: Dr. Joen Laura  REFERRING DIAG: Weakness  THERAPY DIAG:  No diagnosis found.  Rationale for Evaluation and Treatment: Rehabilitation  ONSET DATE: 06/08/23 - referral date  SUBJECTIVE:   SUBJECTIVE STATEMENT: Pt stated feeling good today. When the order was first placed she was bedridden. Now able to walk utilizing a roller walker. She was able to use a SPC. Unable to stand for long periods of time. Not able to cook due to unable to stand for a prolonged period of time. Last hospitalization was for a back injury, feels it is almost fully healed. Weakness from being bedridden for about 5 wks. Reported no  pain in back.   PERTINENT HISTORY: Aortic atherosclerosis Bipolar CHF Severe Obesity Bilateral LE Edema Hypertension Hyperlipidemia Prediabetes Asthma arthritis PSH: heart cath, L leg excisional debridement hematoma PAIN:  Are you having pain? Yes: NPRS scale: 7/10  Pain location: right hip Pain description: pulling, sharp pain Aggravating factors: prolonged standing Relieving factors:  [place] Described feeling pain in knees and ankles generally  PRECAUTIONS: Fall  RED FLAGS: None   WEIGHT BEARING RESTRICTIONS: No  FALLS:  Has patient fallen in last 6 months? 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  DIAGNOSTIC FINDINGS: none recent   COGNITION: Overall cognitive status: Within functional limits for tasks assessed     SENSATION: WFL  POSTURE: rounded shoulders, increased lumbar lordosis, anterior pelvic tilt, and flexed trunk   LOWER EXTREMITY ROM: resting pain 4/10, 8/10 with movements  Active ROM Right eval Left eval  Hip flexion [facility]  Hip extension    Hip abduction    Hip adduction    Hip internal rotation    Hip external rotation    Knee flexion [facility]  Knee extension [facility]  Ankle dorsiflexion [facility] WFL  Ankle plantarflexion [facility] WFL  Ankle inversion    Ankle eversion     (Blank rows = not tested)  LOWER EXTREMITY MMT:  MMT Right eval Left eval  Hip flexion 4-* 3+*  Hip extension    Hip abduction 4* 4*  Hip adduction 4* 4*  Hip internal rotation    Hip external rotation    Knee flexion    Knee extension 3+* 3+*  Ankle dorsiflexion 5 5  Ankle plantarflexion    Ankle inversion    Ankle eversion     (Blank rows = not tested)  FUNCTIONAL TESTS:  30 seconds chair stand test Timed up and go (TUG): 26.37 30 second STS: 4    GAIT: Distance walked: in clinic distances Assistive device utilized: Environmental consultant - 2 wheeled Level of assistance: Modified independence                                                                                                    TREATMENT DATE:  09/19/23 NuStep L3 x 4 min, broken up into 2 minute intervals HS Curls, red band, 2x8 Seated Marches, 2x20 LAQ, 2x10  09/10/23 EVAL    PATIENT EDUCATION:  Education details: POC and HEP Person educated: Patient Education method: Medical illustrator Education comprehension: verbalized understanding and returned demonstration  HOME EXERCISE PROGRAM: Access Code: [email or web address] URL: [email or web address] Date: 09/10/2023 Prepared by: Cassie Freer Exercises  - Seated March  - 1 x daily - 7 x weekly - 2 sets - 10 reps  - Seated Long Arc Quad  -  1 x daily - 7 x weekly - 2 sets - 10 reps  - Seated Hip Adduction Isometrics with Ball  - 1 x daily - 7 x weekly - 2 sets - 10 reps  - Seated Heel Raise  - 1 x daily - 7 x weekly - 2 sets - 10 reps   ASSESSMENT:  CLINICAL IMPRESSION: Patient is a 58  y.o. female who was seen today for physical therapy treatment for generalized weakness. She had more pain today being at a 7/10. She initially reported a 4/10 constant pain at the evaluation. Pain still increases with any motions but is bearable. When doing the NuStep, she did 2 minutes then need a break. We had her complete 4 minutes total. When initially trying LAQ with no weight she had some painful clicking in her left knee. I chose to not complete the rest of that set, but after she stretched out her leg some it felt better and she was able to complete the LAQ. I tried pushing her left patella medially while she complete the LAQ which she reported helped the pain she typically feels. She most likely has some patellar mistracking in her left knee. Pt required longer rest breaks throughout the session. She also reported feeling better after the session. She expressed wanting to work on STS so she has less difficulty with them. Pt will continue benefit from skilled PT to address her generalized weakness and reach  her goals of walking with a cane.  OBJECTIVE IMPAIRMENTS: Abnormal gait, cardiopulmonary status limiting activity, decreased activity tolerance, decreased balance, decreased endurance, difficulty walking, decreased strength, improper body mechanics, postural dysfunction, obesity, and pain.   ACTIVITY LIMITATIONS: carrying, lifting, bending, sitting, standing, squatting, and stairs  PARTICIPATION LIMITATIONS: meal prep, cleaning, laundry, driving, shopping, community activity, occupation, and yard work  PERSONAL FACTORS: Age, Behavior pattern, Education, Fitness, and Time since onset of injury/illness/exacerbation are also affecting patient's functional outcome.   REHAB POTENTIAL: Good  CLINICAL DECISION MAKING: Stable/uncomplicated  EVALUATION COMPLEXITY: Low   GOALS: Goals reviewed with patient? Yes  SHORT TERM GOALS: Target date: 10/22/23  Patient will be independent with initial HEP. Baseline: given 09/10/23 Goal status: INITIAL   LONG TERM GOALS: Target date:12/03/23  Patient will be independent with advanced/ongoing HEP to improve outcomes and carryover.  Baseline:  Goal status: INITIAL  2.  Patient will report at least 75% improvement in bilateral LE pain to improve QOL. Baseline: 4/10 resting, 8/10 with movement (09/10/23) Goal status: INITIAL  3.  Patient will increase standing tolerance to 10 mins or longer to be able to complete household chores. Baseline: unable to do Goal status: INITIAL  4.  Patient will be able to ambulate 200' with LRAD and normal gait pattern without increased pain to access community.  Baseline: TBD Goal status: INITIAL  5.  Patient will perform 7 STS in 30 seconds Baseline: 4 (09/10/23) Goal status: INITIAL   PLAN:  PT FREQUENCY: 2x/week  PT DURATION: 12 weeks  PLANNED INTERVENTIONS: 97110-Therapeutic exercises, 97530- Therapeutic activity, [id number]- Neuromuscular re-education, 97535- Self Care, 16109- Manual therapy, [id number]- Gait  training, [phone]- Aquatic Therapy, Stair training, DME instructions, Cryotherapy, and Moist heat  PLAN FOR NEXT SESSION: bilateral LE strengthening, improve muscular and cardiovascular endurance   Pascal Lux, Student-PT 09/19/2023, 2:12 PM

## 2023-09-23 ENCOUNTER — Encounter: Payer: Self-pay | Admitting: Physical Therapy

## 2023-09-23 ENCOUNTER — Ambulatory Visit: Payer: Medicaid Other | Admitting: Physical Therapy

## 2023-09-23 DIAGNOSIS — R5381 Other malaise: Secondary | ICD-10-CM

## 2023-09-23 DIAGNOSIS — R269 Unspecified abnormalities of gait and mobility: Secondary | ICD-10-CM

## 2023-09-23 DIAGNOSIS — M6281 Muscle weakness (generalized): Secondary | ICD-10-CM

## 2023-09-23 DIAGNOSIS — Z7409 Other reduced mobility: Secondary | ICD-10-CM

## 2023-09-23 DIAGNOSIS — R2681 Unsteadiness on feet: Secondary | ICD-10-CM

## 2023-09-23 NOTE — Therapy (Signed)
 OUTPATIENT PHYSICAL THERAPY LOWER EXTREMITY TREATMENT   Patient Name: Christina Dominguez MRN: [id number] DOB:1966-02-05, 58 y.o., female Today's Date: 09/23/2023  END OF SESSION:  PT End of Session - 09/23/23 1429     Visit Number 3    Date for PT Re-Evaluation 12/03/23    PT Start Time 1430    PT Stop Time 1515    PT Time Calculation (min) 45 min    Activity Tolerance Patient tolerated treatment well    Behavior During Therapy [facility] for tasks assessed/performed             Past Medical History:  Diagnosis Date   Anxiety    Aortic atherosclerosis (HCC) 08/19/2021   Arthritis    Asthma    Bipolar 1 disorder (HCC)    Chronic diastolic CHF (congestive heart failure) (HCC)    Class 3 severe obesity with serious comorbidity and body mass index (BMI) greater than or equal to 70 in adult (HCC) 12/21/2019   Depression    Diastolic dysfunction 08/02/2021   Diverticulosis 08/19/2021   Edema of both lower extremities    Hepatic steatosis 08/19/2021   Hepatomegaly 08/19/2021   High blood pressure    Hyperlipidemia    Joint pain    Morbid obesity (HCC)    Pre-diabetes    Past Surgical History:  Procedure Laterality Date   APPLICATION OF WOUND VAC Left 12/20/2021   Procedure: APPLICATION OF WOUND VAC;  Surgeon: Nadara Mustard, MD;  Location: [facility] OR;  Service: Orthopedics;  Laterality: Left;   I & D EXTREMITY Left 12/20/2021   Procedure: EXCISIONAL DEBRIDEMENT HEMATOMA LEFT LEG;  Surgeon: Nadara Mustard, MD;  Location: [facility] OR;  Service: Orthopedics;  Laterality: Left;   NO PAST SURGERIES     RIGHT/LEFT HEART CATH AND CORONARY ANGIOGRAPHY N/A 10/06/2021   Procedure: RIGHT/LEFT HEART CATH AND CORONARY ANGIOGRAPHY;  Surgeon: Corky Crafts, MD;  Location: [facility] INVASIVE CV LAB;  Service: Cardiovascular;  Laterality: N/A;   Patient Active Problem List   Diagnosis Date Noted   Left leg cellulitis 06/20/2023   Cellulitis 06/19/2023   Cellulitis of lower extremity 06/18/2023    Transaminitis 06/06/2023   Chest pain 06/05/2023   Cellulitis of left lower extremity 06/04/2023   Nocturnal hypoxemia 07/03/2022   Arthritis 04/16/2022   Asthma 04/16/2022   Hematoma of left lower leg    Wheezing 11/23/2021   Snoring 11/23/2021   Other secondary pulmonary hypertension (HCC) 11/23/2021   Hyponatremia 10/11/2021   Dyslipidemia 10/04/2021   Mediastinal mass 10/04/2021   Obstructive sleep apnea 08/25/2021   RUQ pain 08/19/2021   Hepatic steatosis 08/19/2021   Hepatomegaly 08/19/2021   Aortic atherosclerosis (HCC) 08/19/2021   Diverticulosis 08/19/2021   Anxiety 08/02/2021   Bipolar 1 disorder (HCC) 08/02/2021   Chronic diastolic CHF (congestive heart failure) (HCC) 08/02/2021   Depression 08/02/2021   Joint pain 08/02/2021   Screening for malignant neoplasm of colon 08/02/2021   Diastolic dysfunction 08/02/2021   Prediabetes 12/22/2019   Vitamin D insufficiency 12/22/2019   Class 3 severe obesity with serious comorbidity and body mass index (BMI) greater than or equal to 60 in adult [facility]) 12/21/2019   Essential hypertension 04/30/2019   Mild intermittent asthma without complication 04/30/2019    PCP: Dr. Ila Mcgill  REFERRING PROVIDER: Dr. Joen Laura  REFERRING DIAG: Weakness  THERAPY DIAG:  Muscle weakness (generalized)  Impaired functional mobility and endurance  Unsteadiness on feet  Abnormal gait  Physical deconditioning  Rationale for Evaluation  and Treatment: Rehabilitation  ONSET DATE: 06/08/23 - referral date  SUBJECTIVE:   SUBJECTIVE STATEMENT: "Ive been really good lately despite the weather"   PERTINENT HISTORY: Aortic atherosclerosis Bipolar CHF Severe Obesity Bilateral LE Edema Hypertension Hyperlipidemia Prediabetes Asthma arthritis PSH: heart cath, L leg excisional debridement hematoma PAIN:  Are you having pain? Yes: NPRS scale: 5/10  Pain location: Knees R right hip Pain description: pulling, sharp  pain Aggravating factors: prolonged standing Relieving factors: Volteran Described feeling pain in knees and ankles generally  PRECAUTIONS: Fall  RED FLAGS: None   WEIGHT BEARING RESTRICTIONS: No  FALLS:  Has patient fallen in last 6 months? 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  DIAGNOSTIC FINDINGS: none recent   COGNITION: Overall cognitive status: Within functional limits for tasks assessed     SENSATION: WFL  POSTURE: rounded shoulders, increased lumbar lordosis, anterior pelvic tilt, and flexed trunk   LOWER EXTREMITY ROM: resting pain 4/10, 8/10 with movements  Active ROM Right eval Left eval  Hip flexion [facility]  Hip extension    Hip abduction    Hip adduction    Hip internal rotation    Hip external rotation    Knee flexion [facility]  Knee extension [facility]  Ankle dorsiflexion [facility] WFL  Ankle plantarflexion [facility] WFL  Ankle inversion    Ankle eversion     (Blank rows = not tested)  LOWER EXTREMITY MMT:  MMT Right eval Left eval  Hip flexion 4-* 3+*  Hip extension    Hip abduction 4* 4*  Hip adduction 4* 4*  Hip internal rotation    Hip external rotation    Knee flexion    Knee extension 3+* 3+*  Ankle dorsiflexion 5 5  Ankle plantarflexion    Ankle inversion    Ankle eversion     (Blank rows = not tested)  FUNCTIONAL TESTS:  30 seconds chair stand test Timed up and go (TUG): 26.37 30 second STS: 4    GAIT: Distance walked: in clinic distances Assistive device utilized: Environmental consultant - 2 wheeled Level of  assistance: Modified independence                                                                                                   TREATMENT DATE:  09/23/23 S2S x5 pt uses momentum leans to her R using RUE extending knees and pursing against table, Then x3 less momentum both Ue's limiting the push against table increase knee pain.   LAQ 1.5lb 2x10 Seated March 2x10 Seated OHP yellow ball 2x10 Seated HS curls yellow 2x10  09/19/23 NuStep L3 x 4 min, broken up into 2 minute intervals HS Curls, red band, 2x8 Seated  [address] 2x20 LAQ, 2x10  09/10/23 EVAL    PATIENT EDUCATION:  Education details: POC and HEP Person educated: Patient Education method: Medical illustrator Education comprehension: verbalized understanding and returned demonstration  HOME EXERCISE PROGRAM: Access Code: [email or web address] URL: [email or web address] Date: 09/10/2023 Prepared by: Cassie Freer Exercises  - Seated March  - 1 x daily - 7 x weekly - 2 sets - 10 reps  - Seated Long Arc Quad  - 1 x daily - 7 x weekly - 2 sets - 10 reps  - Seated Hip Adduction Isometrics with Ball  - 1 x daily - 7 x weekly - 2 sets - 10 reps  - Seated Heel Raise  - 1 x daily - 7 x weekly - 2 sets - 10 reps   ASSESSMENT:  CLINICAL IMPRESSION: Patient is a 58  y.o. female who was seen today for physical therapy treatment for generalized weakness. She had less pain today being at a 5/10.  Pain still increases with any motions but is bearable. Session started with sit to stands with some compensation, when standing the proper way pt reported increase knee pain.  Pt required longer rest breaks throughout the session. She was able to complete LAQ and HS curls without issue. Seated marches were difficult. . Pt will continue benefit from skilled PT to address her generalized weakness and reach her goals of walking with a cane.  OBJECTIVE IMPAIRMENTS: Abnormal gait, cardiopulmonary status limiting activity, decreased activity  tolerance, decreased balance, decreased endurance, difficulty walking, decreased strength, improper body mechanics, postural dysfunction, obesity, and pain.   ACTIVITY LIMITATIONS: carrying, lifting, bending, sitting, standing, squatting, and stairs  PARTICIPATION LIMITATIONS: meal prep, cleaning, laundry, driving, shopping, community activity, occupation, and yard work  PERSONAL FACTORS: Age, Behavior pattern, Education, Fitness, and Time since onset of injury/illness/exacerbation are also affecting patient's functional outcome.   REHAB POTENTIAL: Good  CLINICAL DECISION MAKING: Stable/uncomplicated  EVALUATION COMPLEXITY: Low   GOALS: Goals reviewed with patient? Yes  SHORT TERM GOALS: Target date: 10/22/23  Patient will be independent with initial HEP. Baseline: given 09/10/23 Goal status: INITIAL   LONG TERM GOALS: Target date:12/03/23  Patient will be independent with advanced/ongoing HEP to improve outcomes and carryover.  Baseline:  Goal status: INITIAL  2.  Patient will report at least 75% improvement in bilateral LE pain to improve QOL. Baseline: 4/10 resting, 8/10 with movement (09/10/23) Goal status: INITIAL  3.  Patient will increase standing tolerance to 10 mins or longer to be able to complete household chores. Baseline: unable to do Goal status: INITIAL  4.  Patient will be able to ambulate 200' with LRAD and normal gait pattern without increased pain to access community.  Baseline: TBD Goal status: INITIAL  5.  Patient will perform 7 STS in 30 seconds Baseline: 4 (09/10/23) Goal status: INITIAL   PLAN:  PT FREQUENCY: 2x/week  PT DURATION: 12 weeks  PLANNED INTERVENTIONS: 97110-Therapeutic exercises, 97530- Therapeutic activity, [id number]- Neuromuscular re-education, 97535- Self Care, 40981- Manual therapy, [phone]- Gait training, [phone]- Aquatic Therapy, Stair training, DME instructions, Cryotherapy, and Moist heat  PLAN FOR NEXT SESSION: bilateral LE  strengthening, improve muscular and cardiovascular endurance   Grayce Sessions, PTA 09/23/2023, 2:30 PM

## 2023-09-24 ENCOUNTER — Ambulatory Visit: Payer: Medicaid Other | Admitting: Pulmonary Disease

## 2023-09-24 ENCOUNTER — Ambulatory Visit: Payer: Medicaid Other | Admitting: Physician Assistant

## 2023-09-24 DIAGNOSIS — G4733 Obstructive sleep apnea (adult) (pediatric): Secondary | ICD-10-CM

## 2023-09-24 NOTE — Progress Notes (Deleted)
  Cardiology Office Note:  .   Date:  09/24/2023  ID:  Christina Dominguez, DOB 02-12-1966, MRN [id number] PCP: Allwardt, Crist Infante, PA-C  [facility] Providers Cardiologist:  Norman Herrlich, MD {  History of Present Illness: .   Christina Dominguez is a 58 y.o. female with a past medical history of chronic diastolic heart failure, pulmonary hypertension, OSA, hypertension, hyperlipidemia, prediabetes, and morbid obesity here for follow-up appointment.  History includes echo in January 2023 revealing EF 55 to 60%, no RWMA, normal LV function, mild concentric LVH, G1 DD, normal RV systolic function, no significant valvular abnormalities.  [facility] 10/2021 revealed no evidence of CAD, EF 55 to 65%, moderate elevated LVEDP, normal LV systolic function, moderate pulmonary hypertension with mean PA pressure of 43 mmHg.  Seen in the office 02/13/2022 and was mildly fluid volume overloaded.  Labs are stable.  Advised to continue Lasix 120 mg daily.  She presented for follow-up 04/12/2022 and was stable from a cardiac standpoint.  Feeling much better and energy levels were up.  Denied dyspnea and edema.  Noted her weight had increased over the past month but she thinks it could be attributed to a brat diet which she was on for IBS.  Noted a dry cough in the mornings.  Not been able to participate in regular water aerobics.  Otherwise, reported feeling well.  Today, she***  ROS: Pertinent ROS in HPI  Studies Reviewed: .        *** Risk Assessment/Calculations:   {Does this patient have ATRIAL FIBRILLATION?:[phone]} No BP recorded.  {Refresh Note OR Click here to enter BP  :1}***       Physical Exam:   VS:  There were no vitals taken for this visit.   Wt Readings from Last 3 Encounters:  06/28/23 (!) 382 lb (173.3 kg)  06/18/23 (!) 400 lb (181.4 kg)  06/08/23 (!) 352 lb 11.8 oz (160 kg)    GEN: Well nourished, well developed in no acute distress NECK: No JVD; No carotid bruits CARDIAC: ***RRR,  no murmurs, rubs, gallops RESPIRATORY:  Clear to auscultation without rales, wheezing or rhonchi  ABDOMEN: Soft, non-tender, non-distended EXTREMITIES:  No edema; No deformity   ASSESSMENT AND PLAN: .   Chronic diastolic heart failure Pulmonary hypertension Hypertension Hyperlipidemia Prediabetes    {Are you ordering a CV Procedure (e.g. stress test, cath, DCCV, TEE, etc)?   Press F2        :[id number]}  Dispo: ***  Signed, Sharlene Dory, PA-C

## 2023-09-26 ENCOUNTER — Ambulatory Visit: Payer: Medicaid Other

## 2023-09-26 DIAGNOSIS — M6281 Muscle weakness (generalized): Secondary | ICD-10-CM | POA: Diagnosis not present

## 2023-09-26 DIAGNOSIS — R269 Unspecified abnormalities of gait and mobility: Secondary | ICD-10-CM

## 2023-09-26 DIAGNOSIS — M79604 Pain in right leg: Secondary | ICD-10-CM

## 2023-09-26 DIAGNOSIS — R2681 Unsteadiness on feet: Secondary | ICD-10-CM

## 2023-09-26 DIAGNOSIS — Z7409 Other reduced mobility: Secondary | ICD-10-CM

## 2023-09-26 DIAGNOSIS — R5381 Other malaise: Secondary | ICD-10-CM

## 2023-09-26 DIAGNOSIS — R2689 Other abnormalities of gait and mobility: Secondary | ICD-10-CM

## 2023-09-26 NOTE — Therapy (Signed)
 OUTPATIENT PHYSICAL THERAPY LOWER EXTREMITY TREATMENT   Patient Name: Christina Dominguez MRN: [id number] DOB:12/04/1965, 58 y.o., female Today's Date: 09/26/2023  END OF SESSION:  PT End of Session - 09/26/23 1542     Visit Number 4    Date for PT Re-Evaluation 12/03/23    PT Start Time 1545    PT Stop Time 1630    PT Time Calculation (min) 45 min    Activity Tolerance Patient tolerated treatment well    Behavior During Therapy [facility] for tasks assessed/performed              Past Medical History:  Diagnosis Date   Anxiety    Aortic atherosclerosis (HCC) 08/19/2021   Arthritis    Asthma    Bipolar 1 disorder (HCC)    Chronic diastolic CHF (congestive heart failure) (HCC)    Class 3 severe obesity with serious comorbidity and body mass index (BMI) greater than or equal to 70 in adult (HCC) 12/21/2019   Depression    Diastolic dysfunction 08/02/2021   Diverticulosis 08/19/2021   Edema of both lower extremities    Hepatic steatosis 08/19/2021   Hepatomegaly 08/19/2021   High blood pressure    Hyperlipidemia    Joint pain    Morbid obesity (HCC)    Pre-diabetes    Past Surgical History:  Procedure Laterality Date   APPLICATION OF WOUND VAC Left 12/20/2021   Procedure: APPLICATION OF WOUND VAC;  Surgeon: Nadara Mustard, MD;  Location: [facility] OR;  Service: Orthopedics;  Laterality: Left;   I & D EXTREMITY Left 12/20/2021   Procedure: EXCISIONAL DEBRIDEMENT HEMATOMA LEFT LEG;  Surgeon: Nadara Mustard, MD;  Location: [facility] OR;  Service: Orthopedics;  Laterality: Left;   NO PAST SURGERIES     RIGHT/LEFT HEART CATH AND CORONARY ANGIOGRAPHY N/A 10/06/2021   Procedure: RIGHT/LEFT HEART CATH AND CORONARY ANGIOGRAPHY;  Surgeon: Corky Crafts, MD;  Location: [facility] INVASIVE CV LAB;  Service: Cardiovascular;  Laterality: N/A;   Patient Active Problem List   Diagnosis Date Noted   Left leg cellulitis 06/20/2023   Cellulitis 06/19/2023   Cellulitis of lower extremity 06/18/2023    Transaminitis 06/06/2023   Chest pain 06/05/2023   Cellulitis of left lower extremity 06/04/2023   Nocturnal hypoxemia 07/03/2022   Arthritis 04/16/2022   Asthma 04/16/2022   Hematoma of left lower leg    Wheezing 11/23/2021   Snoring 11/23/2021   Other secondary pulmonary hypertension (HCC) 11/23/2021   Hyponatremia 10/11/2021   Dyslipidemia 10/04/2021   Mediastinal mass 10/04/2021   Obstructive sleep apnea 08/25/2021   RUQ pain 08/19/2021   Hepatic steatosis 08/19/2021   Hepatomegaly 08/19/2021   Aortic atherosclerosis (HCC) 08/19/2021   Diverticulosis 08/19/2021   Anxiety 08/02/2021   Bipolar 1 disorder (HCC) 08/02/2021   Chronic diastolic CHF (congestive heart failure) (HCC) 08/02/2021   Depression 08/02/2021   Joint pain 08/02/2021   Screening for malignant neoplasm of colon 08/02/2021   Diastolic dysfunction 08/02/2021   Prediabetes 12/22/2019   Vitamin D insufficiency 12/22/2019   Class 3 severe obesity with serious comorbidity and body mass index (BMI) greater than or equal to 60 in adult [facility]) 12/21/2019   Essential hypertension 04/30/2019   Mild intermittent asthma without complication 04/30/2019    PCP: Dr. Ila Mcgill  REFERRING PROVIDER: Dr. Joen Laura  REFERRING DIAG: Weakness  THERAPY DIAG:  Muscle weakness (generalized)  Physical deconditioning  Other abnormalities of gait and mobility  Impaired functional mobility and endurance  Unsteadiness on  feet  Pain in both lower extremities  Abnormal gait  Rationale for Evaluation and Treatment: Rehabilitation  ONSET DATE: 06/08/23 - referral date  SUBJECTIVE:   SUBJECTIVE STATEMENT: I am feeling good despite the cold weather. My knees hurt I leave here. It usually gets better after 24hrs. It wasn't that bad after last visit with Windy Fast.   PERTINENT HISTORY: Aortic atherosclerosis Bipolar CHF Severe Obesity Bilateral LE  Edema Hypertension Hyperlipidemia Prediabetes Asthma arthritis PSH: heart cath, L leg excisional debridement hematoma PAIN:  Are you having pain? Yes: NPRS scale: 5/10  Pain location: Knees R right hip Pain description: pulling, sharp pain Aggravating factors: prolonged standing Relieving factors: Volteran Described feeling pain in knees and ankles generally  PRECAUTIONS: Fall  RED FLAGS: None   WEIGHT BEARING RESTRICTIONS: No  FALLS:  Has patient fallen in last 6 months? 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  DIAGNOSTIC FINDINGS: none recent   COGNITION: Overall cognitive status: Within functional limits for tasks assessed     SENSATION: WFL  POSTURE: rounded shoulders, increased lumbar lordosis, anterior pelvic tilt, and flexed trunk   LOWER EXTREMITY ROM: resting pain 4/10, 8/10 with movements  Active ROM Right eval Left eval  Hip flexion [facility]  Hip extension    Hip abduction    Hip adduction    Hip internal rotation    Hip external rotation    Knee flexion [facility]  Knee extension [facility]  Ankle dorsiflexion [facility] WFL  Ankle plantarflexion [facility] WFL  Ankle inversion    Ankle eversion     (Blank rows = not tested)  LOWER EXTREMITY MMT:  MMT Right eval Left eval  Hip flexion 4-* 3+*  Hip extension    Hip abduction 4* 4*  Hip adduction 4* 4*  Hip internal rotation    Hip external rotation    Knee flexion    Knee extension 3+* 3+*  Ankle dorsiflexion 5 5  Ankle plantarflexion    Ankle inversion    Ankle  eversion     (Blank rows = not tested)  FUNCTIONAL TESTS:  30 seconds chair stand test Timed up and go (TUG): 26.37 30 second STS: 4    GAIT: Distance walked: in clinic distances Assistive device utilized: Environmental consultant - 2 wheeled Level of assistance: Modified independence                                                                                                   TREATMENT DATE:  09/26/23 LAQ 2# 2x5 HS curls red 2x8 Seated row with red band 2x10  STS 2x3 with UE help needed Fitter pushes 1 blue band 2x10  09/23/23 S2S x5 pt uses momentum  leans to her R using RUE extending knees and pursing against table, Then x3 less momentum both Ue's limiting the push against table increase knee pain.   LAQ 1.5lb 2x10 Seated March 2x10 Seated OHP yellow ball 2x10 Seated HS curls yellow 2x10  09/19/23 NuStep L3 x 4 min, broken up into 2 minute intervals HS Curls, red band, 2x8 Seated Marches, 2x20 LAQ, 2x10  09/10/23 EVAL    PATIENT EDUCATION:  Education details: POC and HEP Person educated: Patient Education method: Medical illustrator Education comprehension: verbalized understanding and returned demonstration  HOME EXERCISE PROGRAM: Access Code: [email or web address] URL: [email or web address] Date: 09/10/2023 Prepared by: Cassie Freer Exercises  - Seated March  - 1 x daily - 7 x weekly - 2 sets - 10 reps  - Seated Long Arc Quad  - 1 x daily - 7 x weekly - 2 sets - 10 reps  - Seated Hip Adduction Isometrics with Ball  - 1 x daily - 7 x weekly - 2 sets - 10 reps  - Seated Heel Raise  - 1 x daily - 7 x weekly - 2 sets - 10 reps   ASSESSMENT:  CLINICAL IMPRESSION: Patient is a 58  y.o. female who was seen today for physical therapy treatment for generalized weakness. She had less pain today being at a 5/10. Completed mostly seated exercises. Sit to stands still done with compensation and needing UE push off. Pt required longer rest breaks throughout the session. Pt will  continue benefit from skilled PT to address her generalized weakness and reach her goals of walking with a cane.  OBJECTIVE IMPAIRMENTS: Abnormal gait, cardiopulmonary status limiting activity, decreased activity tolerance, decreased balance, decreased endurance, difficulty walking, decreased strength, improper body mechanics, postural dysfunction, obesity, and pain.   ACTIVITY LIMITATIONS: carrying, lifting, bending, sitting, standing, squatting, and stairs  PARTICIPATION LIMITATIONS: meal prep, cleaning, laundry, driving, shopping, community activity, occupation, and yard work  PERSONAL FACTORS: Age, Behavior pattern, Education, Fitness, and Time since onset of injury/illness/exacerbation are also affecting patient's functional outcome.   REHAB POTENTIAL: Good  CLINICAL DECISION MAKING: Stable/uncomplicated  EVALUATION COMPLEXITY: Low   GOALS: Goals reviewed with patient? Yes  SHORT TERM GOALS: Target date: 10/22/23  Patient will be independent with initial HEP. Baseline: given 09/10/23 Goal status: INITIAL   LONG TERM GOALS: Target date:12/03/23  Patient will be independent with advanced/ongoing HEP to improve outcomes and carryover.  Baseline:  Goal status: INITIAL  2.  Patient will report at least 75% improvement in bilateral LE pain to improve QOL. Baseline: 4/10 resting, 8/10 with movement (09/10/23) Goal status: INITIAL  3.  Patient will increase standing tolerance to 10 mins or longer to be able to complete household chores. Baseline: unable to do Goal status: INITIAL  4.  Patient will be able to ambulate 200' with LRAD and normal gait pattern without increased pain to access community.  Baseline: TBD Goal status: INITIAL  5.  Patient will perform 7 STS in 30 seconds Baseline: 4 (09/10/23) Goal status: INITIAL   PLAN:  PT FREQUENCY: 2x/week  PT DURATION: 12 weeks  PLANNED INTERVENTIONS: 97110-Therapeutic exercises, 97530- Therapeutic activity, [id number]  Neuromuscular re-education, 97535- Self Care, 16109- Manual therapy, [phone]- Gait training, [phone]- Aquatic Therapy, Stair training, DME instructions, Cryotherapy, and Moist heat  PLAN FOR NEXT SESSION: bilateral LE strengthening, improve muscular and cardiovascular endurance   Cassie Freer, PT 09/26/2023, 4:28 PM

## 2023-09-27 ENCOUNTER — Other Ambulatory Visit: Payer: Self-pay

## 2023-09-27 NOTE — Patient Outreach (Signed)
  Medicaid Managed Care   Unsuccessful Outreach Note  09/27/2023 Name: Christina Dominguez MRN: [id number] DOB: May 18, 1966  Referred by: Allwardt, Crist Infante, PA-C Reason for referral : High Risk Managed Medicaid (MM social work unsuccessful telephone outreach /)   An unsuccessful telephone outreach was attempted today. The patient was referred to the case management team for assistance with care management and care coordination.   Follow Up Plan: The care management team will reach out to the patient again over the next 7-15 days.   Abelino Derrick, MHA [facility]  Managed [facility] Social Worker [phone]

## 2023-09-27 NOTE — Patient Instructions (Signed)
  Medicaid Managed Care   Unsuccessful Outreach Note  09/27/2023 Name: Christina Dominguez MRN: [id number] DOB: [birth year]  Referred by: Allwardt, Crist Infante, PA-C Reason for referral : High Risk Managed Medicaid (MM social work unsuccessful telephone outreach /)   An unsuccessful telephone outreach was attempted today. The patient was referred to the case management team for assistance with care management and care coordination.   Follow Up Plan: The care management team will reach out to the patient again over the next 7-10 days.   Abelino Derrick, MHA [facility]  Managed [facility] Social Worker [phone]

## 2023-09-30 NOTE — Therapy (Signed)
 OUTPATIENT PHYSICAL THERAPY LOWER EXTREMITY TREATMENT   Patient Name: Christina Dominguez MRN: [id number] DOB:05-20-1966, 58 y.o., female Today's Date: 10/01/2023  END OF SESSION:  PT End of Session - 10/01/23 1543     Visit Number 5    Date for PT Re-Evaluation 12/03/23    PT Start Time 1545    PT Stop Time 1630    PT Time Calculation (min) 45 min    Activity Tolerance Patient tolerated treatment well    Behavior During Therapy [facility] for tasks assessed/performed               Past Medical History:  Diagnosis Date   Anxiety    Aortic atherosclerosis (HCC) 08/19/2021   Arthritis    Asthma    Bipolar 1 disorder (HCC)    Chronic diastolic CHF (congestive heart failure) (HCC)    Class 3 severe obesity with serious comorbidity and body mass index (BMI) greater than or equal to 70 in adult (HCC) 12/21/2019   Depression    Diastolic dysfunction 08/02/2021   Diverticulosis 08/19/2021   Edema of both lower extremities    Hepatic steatosis 08/19/2021   Hepatomegaly 08/19/2021   High blood pressure    Hyperlipidemia    Joint pain    Morbid obesity (HCC)    Pre-diabetes    Past Surgical History:  Procedure Laterality Date   APPLICATION OF WOUND VAC Left 12/20/2021   Procedure: APPLICATION OF WOUND VAC;  Surgeon: Nadara Mustard, MD;  Location: [facility] OR;  Service: Orthopedics;  Laterality: Left;   I & D EXTREMITY Left 12/20/2021   Procedure: EXCISIONAL DEBRIDEMENT HEMATOMA LEFT LEG;  Surgeon: Nadara Mustard, MD;  Location: [facility] OR;  Service: Orthopedics;  Laterality: Left;   NO PAST SURGERIES     RIGHT/LEFT HEART CATH AND CORONARY ANGIOGRAPHY N/A 10/06/2021   Procedure: RIGHT/LEFT HEART CATH AND CORONARY ANGIOGRAPHY;  Surgeon: Corky Crafts, MD;  Location: [facility] INVASIVE CV LAB;  Service: Cardiovascular;  Laterality: N/A;   Patient Active Problem List   Diagnosis Date Noted   Left leg cellulitis 06/20/2023   Cellulitis 06/19/2023   Cellulitis of lower extremity 06/18/2023    Transaminitis 06/06/2023   Chest pain 06/05/2023   Cellulitis of left lower extremity 06/04/2023   Nocturnal hypoxemia 07/03/2022   Arthritis 04/16/2022   Asthma 04/16/2022   Hematoma of left lower leg    Wheezing 11/23/2021   Snoring 11/23/2021   Other secondary pulmonary hypertension (HCC) 11/23/2021   Hyponatremia 10/11/2021   Dyslipidemia 10/04/2021   Mediastinal mass 10/04/2021   Obstructive sleep apnea 08/25/2021   RUQ pain 08/19/2021   Hepatic steatosis 08/19/2021   Hepatomegaly 08/19/2021   Aortic atherosclerosis (HCC) 08/19/2021   Diverticulosis 08/19/2021   Anxiety 08/02/2021   Bipolar 1 disorder (HCC) 08/02/2021   Chronic diastolic CHF (congestive heart failure) (HCC) 08/02/2021   Depression 08/02/2021   Joint pain 08/02/2021   Screening for malignant neoplasm of colon 08/02/2021   Diastolic dysfunction 08/02/2021   Prediabetes 12/22/2019   Vitamin D insufficiency 12/22/2019   Class 3 severe obesity with serious comorbidity and body mass index (BMI) greater than or equal to 60 in adult [facility]) 12/21/2019   Essential hypertension 04/30/2019   Mild intermittent asthma without complication 04/30/2019    PCP: Dr. Ila Mcgill  REFERRING PROVIDER: Dr. Joen Laura  REFERRING DIAG: Weakness  THERAPY DIAG:  Muscle weakness (generalized)  Physical deconditioning  Other abnormalities of gait and mobility  Abnormal gait  Unsteadiness on feet  Pain in both lower extremities  Impaired functional mobility and endurance  Rationale for Evaluation and Treatment: Rehabilitation  ONSET DATE: 06/08/23 - referral date  SUBJECTIVE:   SUBJECTIVE STATEMENT: I am feeling good despite the cold weather. My knees hurt I leave here. It usually gets better after 24hrs. It wasn't that bad after last visit with Windy Fast.   PERTINENT HISTORY: Aortic atherosclerosis Bipolar CHF Severe Obesity Bilateral LE  Edema Hypertension Hyperlipidemia Prediabetes Asthma arthritis PSH: heart cath, L leg excisional debridement hematoma PAIN:  Are you having pain? Yes: NPRS scale: 5/10  Pain location: Knees R right hip Pain description: pulling, sharp pain Aggravating factors: prolonged standing Relieving factors: [place] Described feeling pain in knees and ankles generally  PRECAUTIONS: Fall  RED FLAGS: None   WEIGHT BEARING RESTRICTIONS: No  FALLS:  Has patient fallen in last 6 months? 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  DIAGNOSTIC FINDINGS: none recent   COGNITION: Overall cognitive status: Within functional limits for tasks assessed     SENSATION: WFL  POSTURE: rounded shoulders, increased lumbar lordosis, anterior pelvic tilt, and flexed trunk   LOWER EXTREMITY ROM: resting pain 4/10, 8/10 with movements  Active ROM Right eval Left eval  Hip flexion [facility]  Hip extension    Hip abduction    Hip adduction    Hip internal rotation    Hip external rotation    Knee flexion [facility]  Knee extension [facility]  Ankle dorsiflexion [facility] WFL  Ankle plantarflexion [facility] WFL  Ankle inversion    Ankle eversion     (Blank rows = not tested)  LOWER EXTREMITY MMT:  MMT Right eval Left eval  Hip flexion 4-* 3+*  Hip extension    Hip abduction 4* 4*  Hip adduction 4* 4*  Hip internal rotation    Hip external rotation    Knee flexion    Knee extension 3+* 3+*  Ankle dorsiflexion 5 5  Ankle plantarflexion    Ankle inversion    Ankle  eversion     (Blank rows = not tested)  FUNCTIONAL TESTS:  30 seconds chair stand test Timed up and go (TUG): 26.37 30 second STS: 4    GAIT: Distance walked: in clinic distances Assistive device utilized: Environmental consultant - 2 wheeled Level of assistance: Modified independence                                                                                                   TREATMENT DATE:  10/01/23 NuStep L4 x5 mins, break at 2 mins  Seated march 20 reps alt x2  Standing march with walker 20 reps x2 Hip abd against red band 2x10 Walking laps 2  mins- able to complete 2 laps without a break  Seated OHP yellow ball 2x10 Seated chest press with yellow ball 2x10  09/26/23 LAQ 2# 2x5 HS curls red 2x8 Seated row with red band 2x10  STS 2x3 with UE help needed Fitter pushes 1 blue band 2x10  09/23/23 S2S x5 pt uses momentum leans to her R using RUE extending knees and pursing against table, Then x3 less momentum both Ue's limiting the push against table increase knee pain.   LAQ 1.5lb 2x10 Seated March 2x10 Seated OHP yellow ball 2x10 Seated HS curls yellow 2x10  09/19/23 NuStep L3 x 4 min, broken up into 2 minute intervals HS Curls, red band, 2x8 Seated Marches, 2x20 LAQ, 2x10  09/10/23 EVAL    PATIENT EDUCATION:  Education details: POC and HEP Person educated: Patient Education method: Medical illustrator Education comprehension: verbalized understanding and returned demonstration  HOME EXERCISE PROGRAM: Access Code: [email or web address] URL: [email or web address] Date: 09/10/2023 Prepared by: Cassie Freer Exercises  - Seated March  - 1 x daily - 7 x weekly - 2 sets - 10 reps  - Seated Long Arc Quad  - 1 x daily - 7 x weekly - 2 sets - 10 reps  - Seated Hip Adduction Isometrics with Ball  - 1 x daily - 7 x weekly - 2 sets - 10 reps  - Seated Heel Raise  - 1 x daily - 7 x weekly - 2 sets - 10 reps   ASSESSMENT:  CLINICAL IMPRESSION: Patient is a 58  y.o. female who  was seen today for physical therapy treatment for generalized weakness. Pain level today is a 5/10. Tried to do more activity today seated and standing. She was able to walk 2 laps in the gym without rest break. Pt required longer rest breaks throughout the session. Pt will continue benefit from skilled PT to address her generalized weakness and reach her goals of walking with a cane.  OBJECTIVE IMPAIRMENTS: Abnormal gait, cardiopulmonary status limiting activity, decreased activity tolerance, decreased balance, decreased endurance, difficulty walking, decreased strength, improper body mechanics, postural dysfunction, obesity, and pain.   ACTIVITY LIMITATIONS: carrying, lifting, bending, sitting, standing, squatting, and stairs  PARTICIPATION LIMITATIONS: meal prep, cleaning, laundry, driving, shopping, community activity, occupation, and yard work  PERSONAL FACTORS: Age, Behavior pattern, Education, Fitness, and Time since onset of injury/illness/exacerbation are also affecting patient's functional outcome.   REHAB POTENTIAL: Good  CLINICAL DECISION MAKING: Stable/uncomplicated  EVALUATION COMPLEXITY: Low   GOALS: Goals reviewed with patient? Yes  SHORT TERM GOALS: Target date: 10/22/23  Patient will be independent with initial HEP. Baseline: given 09/10/23 Goal status: INITIAL   LONG TERM GOALS: Target date:12/03/23  Patient will be independent with advanced/ongoing HEP to improve outcomes and carryover.  Baseline:  Goal status: INITIAL  2.  Patient will report at least 75% improvement in bilateral LE pain to improve QOL. Baseline: 4/10 resting, 8/10 with movement (09/10/23) Goal status: INITIAL  3.  Patient will increase standing tolerance to 10 mins or longer to be able to complete household chores. Baseline: unable to do Goal status: INITIAL  4.  Patient will be able to ambulate 200' with LRAD and normal gait pattern without increased pain to access community.  Baseline:  TBD Goal status: INITIAL  5.  Patient will perform 7 STS in 30 seconds Baseline: 4 (09/10/23) Goal status: INITIAL   PLAN:  PT FREQUENCY: 2x/week  PT DURATION: 12 weeks  PLANNED INTERVENTIONS: 97110-Therapeutic exercises, 97530- Therapeutic activity, [id number]- Neuromuscular  re-education, [phone]- Self Care, 66440- Manual therapy, [phone]- Gait training, [phone]- Aquatic Therapy, Stair training, DME instructions, Cryotherapy, and Moist heat  PLAN FOR NEXT SESSION: bilateral LE strengthening, improve muscular and cardiovascular endurance   Cassie Freer, PT 10/01/2023, 4:30 PM

## 2023-10-01 ENCOUNTER — Encounter: Payer: Self-pay | Admitting: Occupational Therapy

## 2023-10-01 ENCOUNTER — Ambulatory Visit: Payer: Medicaid Other

## 2023-10-01 ENCOUNTER — Ambulatory Visit: Payer: Medicaid Other | Admitting: Occupational Therapy

## 2023-10-01 DIAGNOSIS — M6281 Muscle weakness (generalized): Secondary | ICD-10-CM | POA: Diagnosis not present

## 2023-10-01 DIAGNOSIS — Z7409 Other reduced mobility: Secondary | ICD-10-CM

## 2023-10-01 DIAGNOSIS — R2689 Other abnormalities of gait and mobility: Secondary | ICD-10-CM

## 2023-10-01 DIAGNOSIS — R2681 Unsteadiness on feet: Secondary | ICD-10-CM

## 2023-10-01 DIAGNOSIS — R5381 Other malaise: Secondary | ICD-10-CM

## 2023-10-01 DIAGNOSIS — R269 Unspecified abnormalities of gait and mobility: Secondary | ICD-10-CM

## 2023-10-01 DIAGNOSIS — M79604 Pain in right leg: Secondary | ICD-10-CM

## 2023-10-01 NOTE — Patient Instructions (Addendum)
  Strengthening: Resisted Flexion    Resisted Horizontal Abduction: Bilateral   Sit or stand, tubing in both hands, arms out in front. Keeping arms straight, pinch shoulder blades together and stretch arms out. Repeat _10___ times per set. Do _1-2___ sessions per day, every other day.   Elbow Flexion: Resisted   With tubing held in ___one___ hand(s) and other end secured under foot, curl arm up as far as possible. Repeat _10___ times per set. Do _1-2___ sessions per day, every other day.    Elbow Extension: Resisted   Sit in chair with resistive band secured at armrest (or hold with other hand) and _____one__ elbow bent. Straighten elbow. Repeat _10___ times per set.  Do _1-2___ sessions per day, every other day.  Arm: Rowing    Sit facing tubing at chest level, arms outstretched. wrap band around something stable that will not move . From a slight forward lean, pulling arms back to chest,  Repeat __10-20__ times. Do ___1_ sessions every other day.

## 2023-10-01 NOTE — Therapy (Unsigned)
 OUTPATIENT OCCUPATIONAL THERAPY ORTHO Treatment  Patient Name: Christina Dominguez MRN: [id number] DOB:[birth year], 58 y.o., female Today's Date: 10/01/2023  PCP: Gerrit Friends PA-C REFERRING PROVIDER: Joen Laura MD  END OF SESSION:  OT End of Session - 10/01/23 1517     Visit Number 2    Number of Visits 4    Authorization - Visit Number 1    Authorization - Number of Visits 3    OT Start Time 1455    OT Stop Time 1530    OT Time Calculation (min) 35 min    Activity Tolerance Patient tolerated treatment well    Behavior During Therapy [facility] for tasks assessed/performed              Past Medical History:  Diagnosis Date   Anxiety    Aortic atherosclerosis (HCC) 08/19/2021   Arthritis    Asthma    Bipolar 1 disorder (HCC)    Chronic diastolic CHF (congestive heart failure) (HCC)    Class 3 severe obesity with serious comorbidity and body mass index (BMI) greater than or equal to 70 in adult (HCC) 12/21/2019   Depression    Diastolic dysfunction 08/02/2021   Diverticulosis 08/19/2021   Edema of both lower extremities    Hepatic steatosis 08/19/2021   Hepatomegaly 08/19/2021   High blood pressure    Hyperlipidemia    Joint pain    Morbid obesity (HCC)    Pre-diabetes    Past Surgical History:  Procedure Laterality Date   APPLICATION OF WOUND VAC Left 12/20/2021   Procedure: APPLICATION OF WOUND VAC;  Surgeon: Nadara Mustard, MD;  Location: [facility] OR;  Service: Orthopedics;  Laterality: Left;   I & D EXTREMITY Left 12/20/2021   Procedure: EXCISIONAL DEBRIDEMENT HEMATOMA LEFT LEG;  Surgeon: Nadara Mustard, MD;  Location: [facility] OR;  Service: Orthopedics;  Laterality: Left;   NO PAST SURGERIES     RIGHT/LEFT HEART CATH AND CORONARY ANGIOGRAPHY N/A 10/06/2021   Procedure: RIGHT/LEFT HEART CATH AND CORONARY ANGIOGRAPHY;  Surgeon: Corky Crafts, MD;  Location: [facility] INVASIVE CV LAB;  Service: Cardiovascular;  Laterality: N/A;   Patient Active Problem List    Diagnosis Date Noted   Left leg cellulitis 06/20/2023   Cellulitis 06/19/2023   Cellulitis of lower extremity 06/18/2023   Transaminitis 06/06/2023   Chest pain 06/05/2023   Cellulitis of left lower extremity 06/04/2023   Nocturnal hypoxemia 07/03/2022   Arthritis 04/16/2022   Asthma 04/16/2022   Hematoma of left lower leg    Wheezing 11/23/2021   Snoring 11/23/2021   Other secondary pulmonary hypertension (HCC) 11/23/2021   Hyponatremia 10/11/2021   Dyslipidemia 10/04/2021   Mediastinal mass 10/04/2021   Obstructive sleep apnea 08/25/2021   RUQ pain 08/19/2021   Hepatic steatosis 08/19/2021   Hepatomegaly 08/19/2021   Aortic atherosclerosis (HCC) 08/19/2021   Diverticulosis 08/19/2021   Anxiety 08/02/2021   Bipolar 1 disorder (HCC) 08/02/2021   Chronic diastolic CHF (congestive heart failure) (HCC) 08/02/2021   Depression 08/02/2021   Joint pain 08/02/2021   Screening for malignant neoplasm of colon 08/02/2021   Diastolic dysfunction 08/02/2021   Prediabetes 12/22/2019   Vitamin D insufficiency 12/22/2019   Class 3 severe obesity with serious comorbidity and body mass index (BMI) greater than or equal to 60 in adult [facility]) 12/21/2019   Essential hypertension 04/30/2019   Mild intermittent asthma without complication 04/30/2019    ONSET DATE: 06/08/23- referral date  REFERRING DIAG: R 53.1- weakness  THERAPY DIAG:  Muscle  weakness (generalized)  Other abnormalities of gait and mobility  Unsteadiness on feet  Impaired functional mobility and endurance  Rationale for Evaluation and Treatment: Rehabilitation  SUBJECTIVE:   SUBJECTIVE STATEMENT: Pt reports she is doing better Pt accompanied by: self  PERTINENT HISTORY:  Per chart-57 y.o. female with medical history significant of chronic HFpEF, hypertension, hyperlipidemia, pulmonary hypertension, asthma, chronic bronchitis, lymphedema, severe morbid obesity, OSA, anxiety, depression, bipolar disorder, prediabetes,  chronic left lower extremity wound who was recently admitted 10/29-11/2 for left lower extremity cellulitis and was discharged on cefadroxil x 10 days.  Left lower extremity Doppler ultrasound done during previous admission was negative for DVT.  Patient returned to the hospital 06/18/23 with increasing drainage from the wound with shortness of breath.  She had desaturation with pulse ox of 80% on ambulation and was put on 2 L of nasal cannula on presentation. Pt d/c home 06/21/23.  PRECAUTIONS: Fall  RED FLAGS: None   WEIGHT BEARING RESTRICTIONS: No  PAIN:  Are you having pain? No  FALLS: Has patient fallen in last 6 months? No  LIVING ENVIRONMENT: Lives with: lives alone Lives in: House/apartment Stairs: No Has following equipment at home: Single point cane, bed side commode, and rollator  PLOF: Independent  PATIENT GOALS: increase I  NEXT MD VISIT: unknown  OBJECTIVE:  Note: Objective measures were completed at Evaluation unless otherwise noted.  HAND DOMINANCE: Right  ADLs: Upper body dressing: mod I Lower body dressing: perfroms with difficulty and increased time required  Toileting: mod I uses BSC over toilet Bathing: has transferred to tub but therapist recommends pt gets a tub transfer bench  Tub shower transfers: has transferred     UPPER EXTREMITY ROM:     Active ROM Right eval Left eval  Shoulder flexion 105 100  Shoulder abduction 90 100  Shoulder adduction    Shoulder extension    Shoulder internal rotation    Shoulder external rotation    Elbow flexion    Elbow extension    Wrist flexion    Wrist extension    Wrist ulnar deviation    Wrist radial deviation    Wrist pronation    Wrist supination    (Blank rows = not tested)     UPPER EXTREMITY MMT:     MMT Right eval Left eval  Shoulder flexion 4-/5 3+/5  Shoulder abduction    Shoulder adduction    Shoulder extension    Shoulder internal rotation    Shoulder external rotation     Middle trapezius    Lower trapezius    Elbow flexion 4+/5 4/+/5  Elbow extension 4/5 4+/5  Wrist flexion    Wrist extension    Wrist ulnar deviation    Wrist radial deviation    Wrist pronation    Wrist supination    (Blank rows = not tested)  HAND FUNCTION: Grip strength: Right: 55 lbs; Left: 65 lbs    SENSATION: WFL    COGNITION: Overall cognitive status: Within functional limits for tasks assessed   OBSERVATIONS: Pleasant female agreeable to participate in OT   TREATMENT DATE: 10/01/23- Cane exercises for shoulder flexion, abduction, chest press 10 reps each, min v.c  Pt was educated regarding red theraband HEP, 10-15 reps each see pt instuctions. Pt has contacted her MD regarding tub bench but has not recieved yet.  09/10/23- eval completed, education initated regarding AE/ DME. Information provided regarding tub transfer bench  PATIENT EDUCATION: Education details: red theraband HEP , education regarding sock aide use and reacher use- pt has at home Person educated: Patient Education method: Explanation and Handouts, demonstration, v.c Education comprehension: verbalized understanding, returned demonstration  HOME EXERCISE PROGRAM: red theraband   GOALS: Goals reviewed with patient? Yes    LONG TERM GOALS: Target date: 11/12/23  I with HEP for UE strength Baseline: dependent Goal status:  ongoing, 10/01/23  2.  pt will verbalize understanding of adapted strategies for ADLS / IADLs to maximize safety and I with daily acitivities  Baseline: dependent Goal status:ongoing  3.  Pt will verbalize understanding of energy conservation techniques Baseline: dependent Goal status: INITIAL 4. Pt will increase bilateral shoulder flexion A/ROM by 5* for improved functional reach.  [place]; see above  Goal status:inital     ASSESSMENT:  CLINICAL IMPRESSION: Pt is progressing towards goals with improving strength. She demonstrates understanding of HEP. PERFORMANCE DEFICITS: in functional skills including ADLs, IADLs, strength, flexibility, mobility, balance, endurance, decreased knowledge of precautions, decreased knowledge of use of DME, and UE functional use, , and psychosocial skills including coping strategies, environmental adaptation, habits, interpersonal interactions, and routines and behaviors.   IMPAIRMENTS: are limiting patient from ADLs, IADLs, rest and sleep, work, play, leisure, and social participation.   COMORBIDITIES: may have co-morbidities  that affects occupational performance. Patient will benefit from skilled OT to address above impairments and improve overall function.  MODIFICATION OR ASSISTANCE TO COMPLETE EVALUATION: No modification of tasks or assist necessary to complete an evaluation.  OT OCCUPATIONAL PROFILE AND HISTORY: Detailed assessment: Review of records and additional review of physical, cognitive, psychosocial history related to current functional performance.  CLINICAL DECISION MAKING: LOW - limited treatment options, no task modification necessary  REHAB POTENTIAL: Good  EVALUATION COMPLEXITY: Low      PLAN:  OT FREQUENCY:  3visits plus eval  OT DURATION: 8 weeks  PLANNED INTERVENTIONS: 97168 OT Re-evaluation, 97535 self care/ADL training, 62130 therapeutic exercise, 97530 therapeutic activity, 97140 manual therapy, 97035 ultrasound, 97018 paraffin, 86578 moist heat, 97010 cryotherapy, 97034 contrast bath, passive range of motion, balance training, functional mobility training, energy conservation, coping strategies training, patient/family education, and DME and/or AE instructions  RECOMMENDED OTHER SERVICES: PT  CONSULTED AND AGREED WITH PLAN OF CARE: Patient  PLAN FOR NEXT SESSION: review inital HEP, energy conservation   Hadleigh Felber, OT 10/01/2023,  3:24 PM

## 2023-10-02 ENCOUNTER — Other Ambulatory Visit: Payer: Self-pay | Admitting: Cardiology

## 2023-10-02 ENCOUNTER — Telehealth: Payer: Self-pay | Admitting: Physician Assistant

## 2023-10-02 NOTE — Telephone Encounter (Unsigned)
 Copied from CRM [id number]. Topic: Appointments - Scheduling Inquiry for Clinic >> Oct 02, 2023  4:27 PM Tiffany H wrote: Reason for CRM: Patient called to advise that she would like to move forward with transfer orders as requested from [employer]. Patient was told that she needs a bariatric transfer bench back in December. Wrong product was delivered.   Advised patient of 09/27/23 missed social worker call. Patient would like to reschedule. Please assist.

## 2023-10-03 ENCOUNTER — Other Ambulatory Visit: Payer: Self-pay | Admitting: *Deleted

## 2023-10-03 NOTE — Patient Instructions (Signed)
 Visit Information  Ms. Simon was given information about Medicaid Managed Care team care coordination services as a part of their [facility] Community Plan Medicaid benefit. [name] verbally consented to engagement with the [facility] Managed Care team.   If you are experiencing a medical emergency, please call 911 or report to your local emergency department or urgent care.   If you have a non-emergency medical problem during routine business hours, please contact your provider's office and ask to speak with a nurse.   For questions related to your [facility], please call: [phone] or visit the homepage here: [email or web address]  If you would like to schedule transportation through your [facility], please call the following number at least 2 days in advance of your appointment: [phone]   Rides for urgent appointments can also be made after hours by calling Member Services.  Call the Behavioral Health Crisis Line at [phone], at any time, 24 hours a day, 7 days a week. If you are in danger or need immediate medical attention call 911.  If you would like help to quit smoking, call 1-800-QUIT-NOW (316-155-5862) OR Espaol: 1-855-Djelo-Ya (1-324-401-0272) o para ms informacin haga clic aqu or Text READY to 536-644 to register via text  Ms. Paparella,   Please see education materials related to sleep hygiene provided by MyChart link.  Patient verbalizes understanding of instructions and care plan provided today and agrees to view in MyChart. Active MyChart status and patient understanding of how to access instructions and care plan via MyChart confirmed with patient.     Telephone follow up appointment with Managed Medicaid care management team member scheduled for:11/04/23 at 1:15pm  Estanislado Emms RN, BSN   Value-Based Care  [facility] RN Care Manager [phone]   Following is a copy of your plan of care:  Care Plan : RN Care Manager Plan of Care  Updates made by Heidi Dach, RN since 10/03/2023 12:00 AM     Problem: Health Management needs related to CHF      Long-Range Goal: Development of Plan of Care to address Health Management needs related to CHF   Start Date: 07/15/2023  Expected End Date: 10/13/2023  Note:   Current Barriers:  Chronic Disease Management support and education needs related to CHF   RNCM Clinical Goal(s):  Patient will verbalize understanding of plan for management of CHF as evidenced by patient reports take all medications exactly as prescribed and will call provider for medication related questions as evidenced by patient reports attend all scheduled medical appointments: PT/OT twice weekly visits, 10/10/23 with Cardiology and Pulmonology on 11/25/23 as evidenced by provider documentation continue to work with RN Care Manager to address care management and care coordination needs related to  CHF as evidenced by adherence to CM Team Scheduled appointments work with social worker to address  related to the management of Level of care concerns related to the management of CHF as evidenced by review of EMR and patient or Child psychotherapist report through collaboration with Medical illustrator, provider, and care team.   Interventions: Evaluation of current treatment plan related to  self management and patient's adherence to plan as established by provider   Heart Failure Interventions:  (Status:  Goal on track:  Yes.) Long Term Goal Provided education on low sodium diet Reviewed role of diuretics in prevention of fluid overload and management of heart failure; Discussed the importance of keeping all appointments with provider  Provided patient with education about the role of exercise in the management of heart failure Assessed social determinant of health barriers   Provided patient with information for medical transportation provided by [facility] [phone] Advised patient to reschedule missed appointment with Cardiology-scheduled on 10/10/23 Discussed patient no longer needing PCS, she has gained mobility and ability to perform ADLs with PT Collaborated with PCP requesting shower chair-discussed contacting [employer] [phone] Contact Pulmonology to discuss results of recent sleep study, follow up scheduled on 11/25/23  Patient Goals/Self-Care Activities: Take all medications as prescribed Attend all scheduled provider appointments Call provider office for new concerns or questions  Work with the social worker to address care coordination needs and will continue to work with the clinical team to address health care and disease management related needs call office if I gain more than 2 pounds in one day or 5 pounds in one week use salt in moderation watch for swelling in feet, ankles and legs every day eat more whole grains, fruits and vegetables, lean meats and healthy fats  Follow Up Plan:  Telephone follow up appointment with care management team member scheduled for:   11/04/23 at 9am

## 2023-10-03 NOTE — Telephone Encounter (Signed)
 Please contact patient and schedule an office visit per PCP

## 2023-10-03 NOTE — Patient Outreach (Signed)
 Medicaid Managed Care   Nurse Care Manager Note  10/03/2023 Name:  Christina Dominguez MRN:  [id number] DOB:  1966/01/25  Christina Dominguez is an 58 y.o. year old female who is a primary patient of Allwardt, Christina M, PA-C.  The [facility] Managed Care Coordination team was consulted for assistance with:    CHF  Christina Dominguez was given information about Medicaid Managed Care Coordination team services today. Christina Dominguez Patient agreed to services and verbal consent obtained.  Engaged with patient by telephone for follow up visit in response to provider referral for case management and/or care coordination services.   Patient is participating in a Managed Medicaid Plan:  Yes  Assessments/Interventions:  Review of past medical history, allergies, medications, health status, including review of consultants reports, laboratory and other test data, was performed as part of comprehensive evaluation and provision of chronic care management services.  SDOH (Social Drivers of Health) assessments and interventions performed: SDOH Interventions    Flowsheet Row Patient Outreach Telephone from 09/02/2023 in [place] POPULATION HEALTH DEPARTMENT Patient Outreach Telephone from 07/15/2023 in [place] POPULATION HEALTH DEPARTMENT Telephone from 06/11/2023 in [place] POPULATION HEALTH DEPARTMENT ED to Hosp-Admission (Discharged) from 10/03/2021 in [place] 2[facility] CV PROGRESSIVE CARE  SDOH Interventions      Food Insecurity Interventions Intervention Not Indicated Intervention Not Indicated Intervention Not Indicated Intervention Not Indicated  Housing Interventions Intervention Not Indicated Other (Comment)  [Patient has discussed the issue with the apartment manager] -- Intervention Not Indicated  Transportation Interventions Payor Benefit -- Intervention Not Indicated Intervention Not Indicated  Utilities Interventions Intervention Not Indicated Patient Declined -- --  Financial Strain  Interventions -- -- -- Other (Comment)  [May need to consider disability]       Care Plan  Allergies  Allergen Reactions   Aspirin Nausea And Vomiting   Egg-Derived Products Swelling   Influenza Vaccines Swelling    Medications Reviewed Today     Reviewed by Heidi Dach, RN (Registered Nurse) on 10/03/23 at 1042  Med List Status: <None>   Medication Order Taking? Sig Documenting Provider Last Dose Status Informant  acetaminophen (TYLENOL) 650 MG CR tablet [id number] Yes Take 1,300 mg by mouth every 8 (eight) hours as needed for pain. [provider] Taking Active Self, Pharmacy Records  albuterol (VENTOLIN HFA) 108 (90 Base) MCG/ACT inhaler [id number] Yes Inhale 2 puffs into the lungs every 6 (six) hours as needed for wheezing or shortness of breath. Luciano Cutter, MD Taking Active Self, Pharmacy Records  atorvastatin (LIPITOR) 40 MG tablet [id number] Yes Take 1 tablet by mouth daily. [provider] Taking Active   dicyclomine (BENTYL) 10 MG capsule [id number] No Take 1 capsule (10 mg total) by mouth 4 (four) times daily -  before meals and at bedtime.  Patient not taking: Reported on 10/03/2023   Allwardt, Christina Infante, PA-C Not Taking Active Self, Pharmacy Records  diphenhydrAMINE (BENADRYL) 25 MG tablet [id number] No Take 50 mg by mouth at bedtime as needed for sleep.  Patient not taking: Reported on 10/03/2023   [provider] Not Taking Active Self, Pharmacy Records  diphenhydrAMINE HCl, Sleep, (ZZZQUIL PO) [id number] Yes Take 3 tablets by mouth at bedtime. [provider] Taking Active Self, Pharmacy Records           Med Note (Kelsa Jaworowski A   Thu Oct 03, 2023 10:42 AM) Taking 2 capsules  famotidine (PEPCID) 20 MG tablet [id number] Yes Take by mouth. [provider]  Taking Active   furosemide (LASIX) 80 MG tablet [id number] Yes Take 1 tablet (80 mg total) by mouth daily. 2nd attempt, patient needs and appt for additional refills  Christina Lea, MD Taking Active   gabapentin (NEURONTIN) 100 MG capsule [id number] No Take 1 capsule (100 mg total) by mouth 3 (three) times daily.  Patient not taking: Reported on 10/03/2023   Adonis Huguenin, NP Not Taking Active   methocarbamol (ROBAXIN) 500 MG tablet [id number] No Take 1-2 tablets (500-1,000 mg total) by mouth every 8 (eight) hours as needed for muscle spasms (For side pain.).  Patient not taking: Reported on 10/03/2023   Christina Sellar, NP Not Taking Active   potassium chloride (KLOR-CON) 10 MEQ tablet [id number] No Take by mouth.  Patient not taking: Reported on 10/03/2023   [provider] Not Taking Active   potassium chloride SA (KLOR-CON Dominguez) 20 MEQ tablet [id number] Yes Take 1 tablet (20 mEq total) by mouth daily. 2nd attempt, patient needs an appt for additional refills Christina Lea, MD Taking Active   Semaglutide-Weight Management [facility]) 0.5 MG/0.5ML Ivory Broad [id number] No Inject into the skin.  Patient not taking: Reported on 10/03/2023   [provider] Not Taking Active   valsartan (DIOVAN) 40 MG tablet [id number] Yes Take 1 tablet (40 mg total) by mouth daily. Christina Lea, MD Taking Active Self, Pharmacy Records  WEGOVY 0.25 MG/0.5ML Ivory Broad [id number] Yes Inject into the skin. [provider] Taking Active             Patient Active Problem List   Diagnosis Date Noted   Left leg cellulitis 06/20/2023   Cellulitis 06/19/2023   Cellulitis of lower extremity 06/18/2023   Transaminitis 06/06/2023   Chest pain 06/05/2023   Cellulitis of left lower extremity 06/04/2023   Nocturnal hypoxemia 07/03/2022   Arthritis 04/16/2022   Asthma 04/16/2022   Hematoma of left lower leg    Wheezing 11/23/2021   Snoring 11/23/2021   Other secondary pulmonary hypertension (HCC) 11/23/2021   Hyponatremia 10/11/2021   Dyslipidemia 10/04/2021   Mediastinal mass 10/04/2021   Obstructive sleep apnea 08/25/2021   RUQ pain 08/19/2021    Hepatic steatosis 08/19/2021   Hepatomegaly 08/19/2021   Aortic atherosclerosis (HCC) 08/19/2021   Diverticulosis 08/19/2021   Anxiety 08/02/2021   Bipolar 1 disorder (HCC) 08/02/2021   Chronic diastolic CHF (congestive heart failure) (HCC) 08/02/2021   Depression 08/02/2021   Joint pain 08/02/2021   Screening for malignant neoplasm of colon 08/02/2021   Diastolic dysfunction 08/02/2021   Prediabetes 12/22/2019   Vitamin D insufficiency 12/22/2019   Class 3 severe obesity with serious comorbidity and body mass index (BMI) greater than or equal to 60 in adult [facility]) 12/21/2019   Essential hypertension 04/30/2019   Mild intermittent asthma without complication 04/30/2019    Conditions to be addressed/monitored per PCP order:  CHF  Care Plan : RN Care Manager Plan of Care  Updates made by Heidi Dach, RN since 10/03/2023 12:00 AM     Problem: Health Management needs related to CHF      Long-Range Goal: Development of Plan of Care to address Health Management needs related to CHF   Start Date: 07/15/2023  Expected End Date: 10/13/2023  Note:   Current Barriers:  Chronic Disease Management support and education needs related to CHF   RNCM Clinical Goal(s):  Patient will verbalize understanding of plan for management of CHF as evidenced by patient reports take all medications exactly as prescribed and  will call provider for medication related questions as evidenced by patient reports attend all scheduled medical appointments: PT/OT twice weekly visits, 10/10/23 with Cardiology and Pulmonology on 11/25/23 as evidenced by provider documentation continue to work with RN Care Manager to address care management and care coordination needs related to  CHF as evidenced by adherence to CM Team Scheduled appointments work with social worker to address  related to the management of Level of care concerns related to the management of CHF as evidenced by review of EMR and patient or Child psychotherapist  report through collaboration with Medical illustrator, provider, and care team.   Interventions: Evaluation of current treatment plan related to  self management and patient's adherence to plan as established by provider   Heart Failure Interventions:  (Status:  Goal on track:  Yes.) Long Term Goal Provided education on low sodium diet Reviewed role of diuretics in prevention of fluid overload and management of heart failure; Discussed the importance of keeping all appointments with provider Provided patient with education about the role of exercise in the management of heart failure Assessed social determinant of health barriers  Provided patient with information for medical transportation provided by [facility] [phone] Advised patient to reschedule missed appointment with Cardiology-scheduled on 10/10/23 Discussed patient no longer needing PCS, she has gained mobility and ability to perform ADLs with PT Collaborated with PCP requesting shower chair-discussed contacting [employer] [phone] Contact Pulmonology to discuss results of recent sleep study, follow up scheduled on 11/25/23  Patient Goals/Self-Care Activities: Take all medications as prescribed Attend all scheduled provider appointments Call provider office for new concerns or questions  Work with the social worker to address care coordination needs and will continue to work with the clinical team to address health care and disease management related needs call office if I gain more than 2 pounds in one day or 5 pounds in one week use salt in moderation watch for swelling in feet, ankles and legs every day eat more whole grains, fruits and vegetables, lean meats and healthy fats  Follow Up Plan:  Telephone follow up appointment with care management team member scheduled for:   11/04/23 at 9am      Follow Up:  Patient agrees to Care Plan and Follow-up.  Plan: The Managed Medicaid care management team will reach out to the patient  again over the next 30 days.  Date/time of next scheduled RN care management/care coordination outreach:  11/04/23 at 1:15pm  Estanislado Emms RN, BSN [place]  Value-Based Care [facility] RN Care Manager [phone]

## 2023-10-05 DIAGNOSIS — I509 Heart failure, unspecified: Secondary | ICD-10-CM | POA: Diagnosis not present

## 2023-10-05 DIAGNOSIS — G4733 Obstructive sleep apnea (adult) (pediatric): Secondary | ICD-10-CM | POA: Diagnosis not present

## 2023-10-05 DIAGNOSIS — R531 Weakness: Secondary | ICD-10-CM | POA: Diagnosis not present

## 2023-10-07 NOTE — Progress Notes (Unsigned)
 Cardiology Clinic Note   Patient Name: Christina Dominguez Date of Encounter: 10/10/2023  Primary Care Provider:  Allwardt, Crist Infante, PA-C Primary Cardiologist:  Norman Herrlich, MD  Patient Profile    Christina Dominguez 58 year old female presents to the clinic today for follow-up evaluation of her chronic diastolic CHF and pulmonary hypertension.  Past Medical History    Past Medical History:  Diagnosis Date   Anxiety    Aortic atherosclerosis (HCC) 08/19/2021   Arthritis    Asthma    Bipolar 1 disorder (HCC)    Chronic diastolic CHF (congestive heart failure) (HCC)    Class 3 severe obesity with serious comorbidity and body mass index (BMI) greater than or equal to 70 in adult [facility]) 12/21/2019   Depression    Diastolic dysfunction 08/02/2021   Diverticulosis 08/19/2021   Edema of both lower extremities    Hepatic steatosis 08/19/2021   Hepatomegaly 08/19/2021   High blood pressure    Hyperlipidemia    Joint pain    Morbid obesity (HCC)    Pre-diabetes    Past Surgical History:  Procedure Laterality Date   APPLICATION OF WOUND VAC Left 12/20/2021   Procedure: APPLICATION OF WOUND VAC;  Surgeon: Nadara Mustard, MD;  Location: [facility] OR;  Service: Orthopedics;  Laterality: Left;   I & D EXTREMITY Left 12/20/2021   Procedure: EXCISIONAL DEBRIDEMENT HEMATOMA LEFT LEG;  Surgeon: Nadara Mustard, MD;  Location: [facility] OR;  Service: Orthopedics;  Laterality: Left;   NO PAST SURGERIES     RIGHT/LEFT HEART CATH AND CORONARY ANGIOGRAPHY N/A 10/06/2021   Procedure: RIGHT/LEFT HEART CATH AND CORONARY ANGIOGRAPHY;  Surgeon: Corky Crafts, MD;  Location: [facility] INVASIVE CV LAB;  Service: Cardiovascular;  Laterality: N/A;    Allergies  Allergies  Allergen Reactions   Aspirin Nausea And Vomiting   Egg-Derived Products Swelling   Influenza Vaccines Swelling    History of Present Illness    Christina Dominguez has a PMH of chronic diastolic CHF, pulmonary hypertension, OSA, HTN, HLD,  morbid obesity, and prediabetes.  Echocardiogram 1/23 showed an EF of 55-60%, mild concentric LVH, G1 DD, normal RV systolic function and no significant valvular abnormalities.  Right and left heart cath 3/23 showed no evidence of CAD and EF of 55-65% with moderately elevated LVEDP, moderate pulmonary hypertension with a mean PA pressure of 43 mmHg.  She followed up in the office 02/13/2022 and was mildly fluid volume overloaded.  Her lab work was stable.  She was advised to continue furosemide 120 mg daily.  She was seen again in follow-up by Bernadene Person, NP on 04/12/2022.  During that time she remained stable from a cardiac standpoint.  She reports that she was feeling better.  Her energy level was increased.  She denied lower extremity swelling and trouble with her breathing.  She did note that her weight had increased over the prior month.  She felt this was related to her diet due to her IBS.  She did note a dry cough in the mornings.  She was not able to participate in regular water aerobics due to a fall around that time.  She had fallen out of a chair at home and injured her left foot.  She had no further concerns.  She presents to the clinic today for follow-up evaluation and states she is recovering well.  She had hospitalizations and November for cellulitis.  Her hospitalizations were at [facility].  We reviewed her most recent echocardiogram.  Will be discussed her diastolic CHF.  She expressed understanding.  Her blood pressure today is 126/80.  Her pulse is 74.  She reports that she needs refills of her atorvastatin Lasix and potassium.  She is taking Wegovy.  She has not noticed significant weight loss at this time.  She reports that she will be returning to her PCP for up titration.  She is also waiting on her CPAP machine.  She is working with pulmonology on this.  I will order a BMP today, have her continue fluid restriction, and continue low-sodium diet.  We will plan follow-up in 4 to 6  months.  Today she denies chest pain, lower extremity edema, fatigue, palpitations, melena, hematuria, hemoptysis, diaphoresis, weakness, presyncope, syncope, orthopnea, and PND.    Home Medications    Prior to Admission medications   Medication Sig Start Date End Date Taking? Authorizing Provider  acetaminophen (TYLENOL) 650 MG CR tablet Take 1,300 mg by mouth every 8 (eight) hours as needed for pain.    [provider]  albuterol (VENTOLIN HFA) 108 (90 Base) MCG/ACT inhaler Inhale 2 puffs into the lungs every 6 (six) hours as needed for wheezing or shortness of breath. 01/30/22   Luciano Cutter, MD  atorvastatin (LIPITOR) 40 MG tablet Take 1 tablet by mouth daily. 07/04/23 07/03/24  [provider]  dicyclomine (BENTYL) 10 MG capsule Take 1 capsule (10 mg total) by mouth 4 (four) times daily -  before meals and at bedtime. Patient not taking: Reported on 10/03/2023 10/11/22   Allwardt, Crist Infante, PA-C  diphenhydrAMINE (BENADRYL) 25 MG tablet Take 50 mg by mouth at bedtime as needed for sleep. Patient not taking: Reported on 10/03/2023    [provider]  diphenhydrAMINE HCl, Sleep, (ZZZQUIL PO) Take 3 tablets by mouth at bedtime.    [provider]  famotidine (PEPCID) 20 MG tablet Take by mouth. 07/03/23 10/03/23  [provider]  furosemide (LASIX) 80 MG tablet Take 1 tablet (80 mg total) by mouth daily. 2nd attempt, patient needs and appt for additional refills 09/12/23   Georgeanna Lea, MD  gabapentin (NEURONTIN) 100 MG capsule Take 1 capsule (100 mg total) by mouth 3 (three) times daily. Patient not taking: Reported on 10/03/2023 06/27/23   Adonis Huguenin, NP  methocarbamol (ROBAXIN) 500 MG tablet Take 1-2 tablets (500-1,000 mg total) by mouth every 8 (eight) hours as needed for muscle spasms (For side pain.). Patient not taking: Reported on 10/03/2023 06/28/23   Dulce Sellar, NP  potassium chloride (KLOR-CON) 10 MEQ tablet Take by  mouth. Patient not taking: Reported on 10/03/2023 08/09/21   [provider]  potassium chloride SA (KLOR-CON M) 20 MEQ tablet Take 1 tablet (20 mEq total) by mouth daily. 2nd attempt, patient needs an appt for additional refills 09/12/23   Georgeanna Lea, MD  Semaglutide-Weight Management [facility]) 0.5 MG/0.5ML SOAJ Inject into the skin. Patient not taking: Reported on 10/03/2023 10/07/23   [provider]  valsartan (DIOVAN) 40 MG tablet Take 1 tablet (40 mg total) by mouth daily. 10/11/22   Georgeanna Lea, MD  WEGOVY 0.25 MG/0.5ML SOAJ Inject into the skin. 09/09/23   [provider]    Family History    Family History  Problem Relation Age of Onset   Heart Problems Mother        Broken heart syndrome   Hypertension Mother    Hypercholesterolemia Mother    Bone cancer Father    Testicular cancer  Father    Heart attack Sister    Diabetes Brother    Colon cancer Maternal Grandfather    Dementia Paternal Grandmother    She indicated that her mother is deceased. She indicated that her father is deceased. She indicated that only one of her three sisters is alive. She indicated that only one of her two brothers is alive. She indicated that her maternal grandmother is alive. She indicated that her maternal grandfather is deceased. She indicated that her paternal grandmother is deceased.  Social History    Social History   Socioeconomic History   Marital status: Single    Spouse name: Not on file   Number of children: Not on file   Years of education: Not on file   Highest education level: Not on file  Occupational History   Occupation: Advertising account planner, work from home  Tobacco Use   Smoking status: Never    Passive exposure: Never   Smokeless tobacco: Never  Vaping Use   Vaping status: Never Used  Substance and Sexual Activity   Alcohol use: Not Currently    Comment: Maybe 2 glasses of wine a month   Drug use: Never   Sexual activity: Not on file   Other Topics Concern   Not on file  Social History Narrative   Not on file   Social Drivers of Health   Financial Resource Strain: Patient Declined (08/01/2023)   Received from [facility]   Overall Financial Resource Strain (CARDIA)    Difficulty of Paying Living Expenses: Patient declined  Food Insecurity: Food Insecurity Present (09/02/2023)   Hunger Vital Sign    Worried About Running Out of Food in the Last Year: Sometimes true    Ran Out of Food in the Last Year: Sometimes true  Transportation Needs: No Transportation Needs (09/02/2023)   PRAPARE - Administrator, Civil Service (Medical): No    Lack of Transportation (Non-Medical): No  Physical Activity: Not on file  Stress: Not on file  Social Connections: Not on file  Intimate Partner Violence: Not At Risk (07/15/2023)   Humiliation, Afraid, Rape, and Kick questionnaire    Fear of Current or Ex-Partner: No    Emotionally Abused: No    Physically Abused: No    Sexually Abused: No     Review of Systems    General:  No chills, fever, night sweats or weight changes.  Cardiovascular:  No chest pain, dyspnea on exertion, edema, orthopnea, palpitations, paroxysmal nocturnal dyspnea. Dermatological: No rash, lesions/masses Respiratory: No cough, dyspnea Urologic: No hematuria, dysuria Abdominal:   No nausea, vomiting, diarrhea, bright red blood per rectum, melena, or hematemesis Neurologic:  No visual changes, wkns, changes in mental status. All other systems reviewed and are otherwise negative except as noted above.  Physical Exam    VS:  BP 126/82 (BP Location: Left Wrist, Patient Position: Sitting, Cuff Size: Normal)   Pulse 74   Ht 5' (1.524 m)   Wt (!) 382 lb 3.2 oz (173.4 kg)   SpO2 98%   BMI 74.64 kg/m  , BMI Body mass index is 74.64 kg/m. GEN: Well nourished, well developed, in no acute distress. HEENT: normal. Neck: Supple, no JVD, carotid bruits, or masses. Cardiac: RRR, no murmurs, rubs, or  gallops. No clubbing, cyanosis, edema.  Radials/DP/PT 2+ and equal bilaterally.  Respiratory:  Respirations regular and unlabored, clear to auscultation bilaterally. GI: Soft, nontender, nondistended, BS + x 4. MS: no deformity or atrophy. Skin: warm and  dry, no rash. Neuro:  Strength and sensation are intact. Psych: Normal affect.  Accessory Clinical Findings    Recent Labs: 06/06/2023: ALT 48 06/18/2023: B Natriuretic Peptide 61.4 06/21/2023: BUN 24; Creatinine, Ser 0.67; Hemoglobin 11.7; Magnesium 2.1; Platelets 367; Potassium 3.5; Sodium 134   Recent Lipid Panel    Component Value Date/Time   CHOL 158 12/21/2019 1350   TRIG 58 12/21/2019 1350   HDL 58 12/21/2019 1350   LDLCALC 88 12/21/2019 1350         ECG personally reviewed by me today- None today.    Echocardiogram 08/20/2021  IMPRESSIONS     1. Left ventricular ejection fraction, by estimation, is 55 to 60%. The  left ventricle has normal function. The left ventricle has no regional  wall motion abnormalities. There is mild concentric left ventricular  hypertrophy. Left ventricular diastolic  parameters are consistent with Grade I diastolic dysfunction (impaired  relaxation).   2. Right ventricular systolic function is normal. The right ventricular  size is normal. Tricuspid regurgitation signal is inadequate for assessing  PA pressure.   3. The mitral valve is normal in structure. No evidence of mitral valve  regurgitation. No evidence of mitral stenosis.   4. The aortic valve was not well visualized. Aortic valve regurgitation  is not visualized. No aortic stenosis is present.   5. The inferior vena cava is normal in size with <50% respiratory  variability, suggesting right atrial pressure of 8 mmHg.   Comparison(s): No prior Echocardiogram.   FINDINGS   Left Ventricle: Left ventricular ejection fraction, by estimation, is 55  to 60%. The left ventricle has normal function. The left ventricle has no   regional wall motion abnormalities. The left ventricular internal cavity  size was normal in size. There is   mild concentric left ventricular hypertrophy. Left ventricular diastolic  parameters are consistent with Grade I diastolic dysfunction (impaired  relaxation).   Right Ventricle: The right ventricular size is normal. No increase in  right ventricular wall thickness. Right ventricular systolic function is  normal. Tricuspid regurgitation signal is inadequate for assessing PA  pressure.   Left [facility]: Left atrial size was normal in size.   Right [facility]: Right atrial size was normal in size.   Pericardium: Trivial pericardial effusion is present. Presence of  epicardial fat layer.   Mitral Valve: The mitral valve is normal in structure. No evidence of  mitral valve regurgitation. No evidence of mitral valve stenosis.   Tricuspid Valve: The tricuspid valve is normal in structure. Tricuspid  valve regurgitation is not demonstrated. No evidence of tricuspid  stenosis.   Aortic Valve: The aortic valve was not well visualized. Aortic valve  regurgitation is not visualized. No aortic stenosis is present. Aortic  valve peak gradient measures 8.6 mmHg.   Pulmonic Valve: The pulmonic valve was not well visualized. Pulmonic valve  regurgitation is not visualized. No evidence of pulmonic stenosis.   Aorta: The aortic root and ascending aorta are structurally normal, with  no evidence of dilitation.   Venous: The inferior vena cava is normal in size with less than 50%  respiratory variability, suggesting right atrial pressure of 8 mmHg.   IAS/Shunts: No atrial level shunt detected by color flow Doppler.        Assessment & Plan   1.  Chronic diastolic CHF-euvolemic today.  Weight today 382.2 pounds.  Echocardiogram 08/20/2021 showed LVEF of 55-60%, G1 DD, and no significant valvular abnormalities. Heart healthy low-sodium diet Daily weights Elevate  lower extremities when  not active Continue furosemide, valsartan Order BMP  Pulmonary hypertension-  Underwent right and left heart cath 3/23 which showed no evidence of CAD and moderately elevated LVEDP with a mean pulmonary artery pressure of 43.  This was felt to be secondary to untreated OSA and diastolic CHF. Continue furosemide, potassium  OSA-waiting on CPAP device.  Working with pulmonology.  Sleeping with her head elevated. Continue CPAP use Avoid supine sleeping Follows with pulmonology  Essential hypertension-BP today 126/82 Heart healthy low-sodium diet Maintain blood pressure log Continue furosemide, valsartan  Hyperlipidemia-refill lipitor High-fiber diet Increase physical activity as tolerated Follows with PCP Fasting lipids and lfts-lab slips provided  Obesity-weight today 382.2. Reduced calorie diet Continue daily weights Increase physical activity as tolerated  Disposition: Follow-up with Dr. Dulce Sellar or me in 4-6 months.   Thomasene Ripple. Alizay Bronkema NP-C     10/10/2023, 4:25 PM [facility] [address] Office [phone] Fax [phone]    I spent 14 minutes examining this patient, reviewing medications, and using patient centered shared decision making involving their cardiac care.   I spent  20 minutes reviewing past medical history,  medications, and prior cardiac tests.

## 2023-10-08 ENCOUNTER — Ambulatory Visit: Payer: Medicaid Other | Attending: Family Medicine | Admitting: Physical Therapy

## 2023-10-08 ENCOUNTER — Encounter: Payer: Self-pay | Admitting: Physical Therapy

## 2023-10-08 ENCOUNTER — Ambulatory Visit: Admitting: Occupational Therapy

## 2023-10-08 ENCOUNTER — Encounter: Payer: Self-pay | Admitting: Occupational Therapy

## 2023-10-08 DIAGNOSIS — M6281 Muscle weakness (generalized): Secondary | ICD-10-CM | POA: Diagnosis present

## 2023-10-08 DIAGNOSIS — R2689 Other abnormalities of gait and mobility: Secondary | ICD-10-CM | POA: Diagnosis present

## 2023-10-08 DIAGNOSIS — R2681 Unsteadiness on feet: Secondary | ICD-10-CM

## 2023-10-08 DIAGNOSIS — Z7409 Other reduced mobility: Secondary | ICD-10-CM | POA: Diagnosis present

## 2023-10-08 DIAGNOSIS — R5381 Other malaise: Secondary | ICD-10-CM | POA: Insufficient documentation

## 2023-10-08 DIAGNOSIS — M79605 Pain in left leg: Secondary | ICD-10-CM | POA: Diagnosis present

## 2023-10-08 DIAGNOSIS — M79604 Pain in right leg: Secondary | ICD-10-CM | POA: Diagnosis present

## 2023-10-08 DIAGNOSIS — G4733 Obstructive sleep apnea (adult) (pediatric): Secondary | ICD-10-CM | POA: Diagnosis not present

## 2023-10-08 DIAGNOSIS — R269 Unspecified abnormalities of gait and mobility: Secondary | ICD-10-CM | POA: Insufficient documentation

## 2023-10-08 NOTE — Therapy (Signed)
 OUTPATIENT PHYSICAL THERAPY LOWER EXTREMITY TREATMENT   Patient Name: Christina Dominguez MRN: [id number] DOB:[birth year], 58 y.o., female Today's Date: 10/08/2023  END OF SESSION:  PT End of Session - 10/08/23 1334     Visit Number 6    Date for PT Re-Evaluation 12/03/23    PT Start Time 1332    PT Stop Time 1414    PT Time Calculation (min) 42 min    Activity Tolerance Patient tolerated treatment well    Behavior During Therapy [facility] for tasks assessed/performed                Past Medical History:  Diagnosis Date   Anxiety    Aortic atherosclerosis (HCC) 08/19/2021   Arthritis    Asthma    Bipolar 1 disorder (HCC)    Chronic diastolic CHF (congestive heart failure) (HCC)    Class 3 severe obesity with serious comorbidity and body mass index (BMI) greater than or equal to 70 in adult (HCC) 12/21/2019   Depression    Diastolic dysfunction 08/02/2021   Diverticulosis 08/19/2021   Edema of both lower extremities    Hepatic steatosis 08/19/2021   Hepatomegaly 08/19/2021   High blood pressure    Hyperlipidemia    Joint pain    Morbid obesity (HCC)    Pre-diabetes    Past Surgical History:  Procedure Laterality Date   APPLICATION OF WOUND VAC Left 12/20/2021   Procedure: APPLICATION OF WOUND VAC;  Surgeon: Nadara Mustard, MD;  Location: [facility] OR;  Service: Orthopedics;  Laterality: Left;   I & D EXTREMITY Left 12/20/2021   Procedure: EXCISIONAL DEBRIDEMENT HEMATOMA LEFT LEG;  Surgeon: Nadara Mustard, MD;  Location: [facility] OR;  Service: Orthopedics;  Laterality: Left;   NO PAST SURGERIES     RIGHT/LEFT HEART CATH AND CORONARY ANGIOGRAPHY N/A 10/06/2021   Procedure: RIGHT/LEFT HEART CATH AND CORONARY ANGIOGRAPHY;  Surgeon: Corky Crafts, MD;  Location: [facility] INVASIVE CV LAB;  Service: Cardiovascular;  Laterality: N/A;   Patient Active Problem List   Diagnosis Date Noted   Left leg cellulitis 06/20/2023   Cellulitis 06/19/2023   Cellulitis of lower extremity 06/18/2023    Transaminitis 06/06/2023   Chest pain 06/05/2023   Cellulitis of left lower extremity 06/04/2023   Nocturnal hypoxemia 07/03/2022   Arthritis 04/16/2022   Asthma 04/16/2022   Hematoma of left lower leg    Wheezing 11/23/2021   Snoring 11/23/2021   Other secondary pulmonary hypertension (HCC) 11/23/2021   Hyponatremia 10/11/2021   Dyslipidemia 10/04/2021   Mediastinal mass 10/04/2021   Obstructive sleep apnea 08/25/2021   RUQ pain 08/19/2021   Hepatic steatosis 08/19/2021   Hepatomegaly 08/19/2021   Aortic atherosclerosis (HCC) 08/19/2021   Diverticulosis 08/19/2021   Anxiety 08/02/2021   Bipolar 1 disorder (HCC) 08/02/2021   Chronic diastolic CHF (congestive heart failure) (HCC) 08/02/2021   Depression 08/02/2021   Joint pain 08/02/2021   Screening for malignant neoplasm of colon 08/02/2021   Diastolic dysfunction 08/02/2021   Prediabetes 12/22/2019   Vitamin D insufficiency 12/22/2019   Class 3 severe obesity with serious comorbidity and body mass index (BMI) greater than or equal to 60 in adult [facility]) 12/21/2019   Essential hypertension 04/30/2019   Mild intermittent asthma without complication 04/30/2019    PCP: Dr. Ila Mcgill  REFERRING PROVIDER: Dr. Joen Laura  REFERRING DIAG: Weakness  THERAPY DIAG:  Muscle weakness (generalized)  Other abnormalities of gait and mobility  Unsteadiness on feet  Impaired functional mobility and endurance  Rationale for Evaluation and Treatment: Rehabilitation  ONSET DATE: 06/08/23 - referral date  SUBJECTIVE:   SUBJECTIVE STATEMENT:  Doing OK, pain is getting better since starting therapy. Agility is improving, able to get up a little easier from sitting    PERTINENT HISTORY: Aortic atherosclerosis Bipolar CHF Severe Obesity Bilateral LE Edema Hypertension Hyperlipidemia Prediabetes Asthma arthritis PSH: heart cath, L leg excisional debridement hematoma PAIN:  Are you having pain? Yes: NPRS  scale: 3/10  Pain location: Knees R right hip Pain description: pulling, sharp pain Aggravating factors: prolonged standing Relieving factors: [place] Described feeling pain in knees and ankles generally  PRECAUTIONS: Fall  RED FLAGS: None   WEIGHT BEARING RESTRICTIONS: No  FALLS:  Has patient fallen in last 6 months? 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  DIAGNOSTIC FINDINGS: none recent   COGNITION: Overall cognitive status: Within functional limits for tasks assessed     SENSATION: WFL  POSTURE: rounded shoulders, increased lumbar lordosis, anterior pelvic tilt, and flexed trunk   LOWER EXTREMITY ROM: resting pain 4/10, 8/10 with movements  Active ROM Right eval Left eval  Hip flexion [facility]  Hip extension    Hip abduction    Hip adduction    Hip internal rotation    Hip external rotation    Knee flexion [facility]  Knee extension [facility]  Ankle dorsiflexion [facility]  Ankle plantarflexion [facility]  Ankle inversion    Ankle eversion     (Blank rows = not tested)  LOWER EXTREMITY MMT:  MMT Right eval Left eval Right 10/08/23 Left 10/08/23  Hip flexion 4-* 3+* 4 4  Hip extension      Hip abduction 4* 4* 4+ 4+  Hip adduction 4* 4*    Hip internal rotation      Hip external rotation      Knee flexion   4 4-  Knee extension 3+* 3+* 4 4  Ankle dorsiflexion 5 5    Ankle plantarflexion      Ankle inversion      Ankle eversion       (Blank rows = not tested)  FUNCTIONAL TESTS:  30 seconds chair stand test Timed up  and go (TUG): 26.37 30 second STS: 4    GAIT: Distance walked: in clinic distances Assistive device utilized: Environmental consultant - 2 wheeled Level of assistance: Modified independence                                                                                                   TREATMENT DATE:   10/08/23  MMT update (see above)  Nustep L4x6 minutes (breaks every 2 minutes) Gait x3 laps with RW (untimed, no breaks)  2 lap walk walk with 1# each leg   LAQ 1# x10 B alternating Chest press yellow ball x12 Seated marches 1# x10 B alternating  OH press 2# each UE x10 B alternating   Sitting UE ABD 2# each UE x12           10/01/23 NuStep L4 x5 mins, break at 2 mins  Seated march 20 reps alt x2  Standing march with walker 20 reps x2 Hip abd against red band 2x10 Walking laps 2 mins- able to complete 2 laps without a break  Seated OHP yellow ball 2x10 Seated chest press with yellow ball 2x10  09/26/23 LAQ 2# 2x5 HS curls red 2x8 Seated row with red band 2x10  STS 2x3 with UE help needed Fitter pushes 1 blue band 2x10  09/23/23 S2S x5 pt uses momentum leans to her R using RUE extending knees and pursing against table, Then x3 less momentum both Ue's limiting the push against table increase knee pain.   LAQ 1.5lb 2x10 Seated March 2x10 Seated OHP yellow ball 2x10 Seated HS curls yellow 2x10  09/19/23 NuStep L3 x 4 min, broken up into 2 minute intervals HS Curls, red band, 2x8 Seated Marches, 2x20 LAQ, 2x10  09/10/23 EVAL    PATIENT EDUCATION:  Education details: POC and HEP Person educated: Patient Education method: Medical illustrator Education comprehension: verbalized understanding and returned demonstration  HOME EXERCISE PROGRAM: Access Code: [email or web address] URL: [email or web address] Date: 09/10/2023 Prepared by: Cassie Freer Exercises  - Seated March  - 1 x daily - 7 x weekly - 2 sets - 10 reps  - Seated Long Arc Quad  - 1 x daily - 7 x weekly - 2  sets - 10 reps  - Seated Hip Adduction Isometrics with Ball  - 1 x daily - 7 x weekly - 2 sets - 10 reps  - Seated Heel Raise  - 1 x daily - 7 x weekly - 2 sets - 10 reps   ASSESSMENT:  CLINICAL IMPRESSION:  Pt arrives today doing pretty well, we rechecked her strength today and she is making good progress with PT. We continued working on functional strengthening and functional activity tolerance as tolerated. She is really motivated to improve, anticipate she will continue to make good progress towards all goals.     OBJECTIVE IMPAIRMENTS: Abnormal gait, cardiopulmonary status limiting activity, decreased activity tolerance, decreased balance, decreased endurance, difficulty walking, decreased strength, improper body mechanics, postural dysfunction, obesity, and pain.   ACTIVITY LIMITATIONS: carrying, lifting, bending, sitting, standing, squatting, and stairs  PARTICIPATION LIMITATIONS: meal prep, cleaning, laundry, driving, shopping, community activity, occupation, and yard work  PERSONAL FACTORS: Age, Behavior pattern, Education, Fitness, and Time since onset of injury/illness/exacerbation are also affecting patient's functional outcome.   REHAB POTENTIAL: Good  CLINICAL DECISION MAKING: Stable/uncomplicated  EVALUATION COMPLEXITY: Low   GOALS: Goals reviewed with patient? Yes  SHORT TERM GOALS: Target date: 10/22/23  Patient will be independent with initial HEP. Baseline: given 09/10/23 Goal status: INITIAL   LONG TERM GOALS: Target date:12/03/23  Patient will be independent with advanced/ongoing HEP to improve outcomes and carryover.  Baseline:  Goal status: INITIAL  2.  Patient will report at least 75% improvement in bilateral LE pain to improve QOL. Baseline: 4/10 resting, 8/10 with movement (09/10/23) Goal status: INITIAL  3.  Patient will increase standing tolerance to 10 mins or longer to be able to complete household chores. Baseline: unable to do Goal status:  INITIAL  4.  Patient will be able to ambulate 200' with LRAD and normal gait pattern without increased pain to access community.  Baseline: TBD Goal status: INITIAL  5.  Patient will perform 7 STS in 30 seconds Baseline: 4 (09/10/23) Goal status: INITIAL   PLAN:  PT FREQUENCY: 2x/week  PT DURATION: 12 weeks  PLANNED INTERVENTIONS: 97110-Therapeutic exercises, 97530- Therapeutic activity, [id number]- Neuromuscular re-education, 97535- Self Care, 98119- Manual therapy, [id number]- Gait training, [phone]- Aquatic Therapy, Stair training, DME instructions, Cryotherapy, and Moist heat  PLAN FOR NEXT SESSION: bilateral LE strengthening, improve muscular and cardiovascular endurance, progressions PRN   Nedra Hai, PT, DPT 10/08/23 2:15 PM

## 2023-10-08 NOTE — Patient Instructions (Signed)

## 2023-10-08 NOTE — Therapy (Signed)
 OUTPATIENT OCCUPATIONAL THERAPY ORTHO Treatment  Patient Name: Christina Dominguez MRN: [id number] DOB:02-28-1966, 58 y.o., female Today's Date: 10/08/2023  PCP: Gerrit Friends PA-C REFERRING PROVIDER: Joen Laura MD  END OF SESSION:  OT End of Session - 10/08/23 1423     Visit Number 3    Number of Visits 4    Date for OT Re-Evaluation 11/12/23    Authorization - Visit Number 2    Authorization - Number of Visits 3              Past Medical History:  Diagnosis Date   Anxiety    Aortic atherosclerosis (HCC) 08/19/2021   Arthritis    Asthma    Bipolar 1 disorder (HCC)    Chronic diastolic CHF (congestive heart failure) (HCC)    Class 3 severe obesity with serious comorbidity and body mass index (BMI) greater than or equal to 70 in adult [facility]) 12/21/2019   Depression    Diastolic dysfunction 08/02/2021   Diverticulosis 08/19/2021   Edema of both lower extremities    Hepatic steatosis 08/19/2021   Hepatomegaly 08/19/2021   High blood pressure    Hyperlipidemia    Joint pain    Morbid obesity (HCC)    Pre-diabetes    Past Surgical History:  Procedure Laterality Date   APPLICATION OF WOUND VAC Left 12/20/2021   Procedure: APPLICATION OF WOUND VAC;  Surgeon: Nadara Mustard, MD;  Location: [facility] OR;  Service: Orthopedics;  Laterality: Left;   I & D EXTREMITY Left 12/20/2021   Procedure: EXCISIONAL DEBRIDEMENT HEMATOMA LEFT LEG;  Surgeon: Nadara Mustard, MD;  Location: [facility] OR;  Service: Orthopedics;  Laterality: Left;   NO PAST SURGERIES     RIGHT/LEFT HEART CATH AND CORONARY ANGIOGRAPHY N/A 10/06/2021   Procedure: RIGHT/LEFT HEART CATH AND CORONARY ANGIOGRAPHY;  Surgeon: Corky Crafts, MD;  Location: [facility] INVASIVE CV LAB;  Service: Cardiovascular;  Laterality: N/A;   Patient Active Problem List   Diagnosis Date Noted   Left leg cellulitis 06/20/2023   Cellulitis 06/19/2023   Cellulitis of lower extremity 06/18/2023   Transaminitis 06/06/2023   Chest pain  06/05/2023   Cellulitis of left lower extremity 06/04/2023   Nocturnal hypoxemia 07/03/2022   Arthritis 04/16/2022   Asthma 04/16/2022   Hematoma of left lower leg    Wheezing 11/23/2021   Snoring 11/23/2021   Other secondary pulmonary hypertension (HCC) 11/23/2021   Hyponatremia 10/11/2021   Dyslipidemia 10/04/2021   Mediastinal mass 10/04/2021   Obstructive sleep apnea 08/25/2021   RUQ pain 08/19/2021   Hepatic steatosis 08/19/2021   Hepatomegaly 08/19/2021   Aortic atherosclerosis (HCC) 08/19/2021   Diverticulosis 08/19/2021   Anxiety 08/02/2021   Bipolar 1 disorder (HCC) 08/02/2021   Chronic diastolic CHF (congestive heart failure) (HCC) 08/02/2021   Depression 08/02/2021   Joint pain 08/02/2021   Screening for malignant neoplasm of colon 08/02/2021   Diastolic dysfunction 08/02/2021   Prediabetes 12/22/2019   Vitamin D insufficiency 12/22/2019   Class 3 severe obesity with serious comorbidity and body mass index (BMI) greater than or equal to 60 in adult [facility]) 12/21/2019   Essential hypertension 04/30/2019   Mild intermittent asthma without complication 04/30/2019    ONSET DATE: 06/08/23- referral date  REFERRING DIAG: R 53.1- weakness  THERAPY DIAG:  Muscle weakness (generalized)  Other abnormalities of gait and mobility  Unsteadiness on feet  Abnormal gait  Rationale for Evaluation and Treatment: Rehabilitation  SUBJECTIVE:   SUBJECTIVE STATEMENT: Pt reports  things are  about the same  Pt accompanied by: self  PERTINENT HISTORY:  Per chart-57 y.o. female with medical history significant of chronic HFpEF, hypertension, hyperlipidemia, pulmonary hypertension, asthma, chronic bronchitis, lymphedema, severe morbid obesity, OSA, anxiety, depression, bipolar disorder, prediabetes, chronic left lower extremity wound who was recently admitted 10/29-11/2 for left lower extremity cellulitis and was discharged on cefadroxil x 10 days.  Left lower extremity Doppler  ultrasound done during previous admission was negative for DVT.  Patient returned to the hospital 06/18/23 with increasing drainage from the wound with shortness of breath.  She had desaturation with pulse ox of 80% on ambulation and was put on 2 L of nasal cannula on presentation. Pt d/c home 06/21/23.  PRECAUTIONS: Fall  RED FLAGS: None   WEIGHT BEARING RESTRICTIONS: No  PAIN: PAIN:  Are you having pain? Yes: NPRS scale: 3/10 Pain location: knees  Pain description: aching Aggravating factors: walking Relieving factors: rest      FALLS: Has patient fallen in last 6 months? No  LIVING ENVIRONMENT: Lives with: lives alone Lives in: House/apartment Stairs: No Has following equipment at home: Single point cane, bed side commode, and rollator  PLOF: Independent  PATIENT GOALS: increase I  NEXT MD VISIT: unknown  OBJECTIVE:  Note: Objective measures were completed at Evaluation unless otherwise noted.  HAND DOMINANCE: Right  ADLs: Upper body dressing: mod I Lower body dressing: perfroms with difficulty and increased time required  Toileting: mod I uses BSC over toilet Bathing: has transferred to tub but therapist recommends pt gets a tub transfer bench  Tub shower transfers: has transferred     UPPER EXTREMITY ROM:     Active ROM Right eval Left eval  Shoulder flexion 105 100  Shoulder abduction 90 100  Shoulder adduction    Shoulder extension    Shoulder internal rotation    Shoulder external rotation    Elbow flexion    Elbow extension    Wrist flexion    Wrist extension    Wrist ulnar deviation    Wrist radial deviation    Wrist pronation    Wrist supination    (Blank rows = not tested)     UPPER EXTREMITY MMT:     MMT Right eval Left eval  Shoulder flexion 4-/5 3+/5  Shoulder abduction    Shoulder adduction    Shoulder extension    Shoulder internal rotation    Shoulder external rotation    Middle trapezius    Lower trapezius    Elbow  flexion 4+/5 4/+/5  Elbow extension 4/5 4+/5  Wrist flexion    Wrist extension    Wrist ulnar deviation    Wrist radial deviation    Wrist pronation    Wrist supination    (Blank rows = not tested)  HAND FUNCTION: Grip strength: Right: 55 lbs; Left: 65 lbs    SENSATION: WFL    COGNITION: Overall cognitive status: Within functional limits for tasks assessed   OBSERVATIONS: Pleasant female agreeable to participate in OT   TREATMENT DATE: 10/08/23-  UBE x 5 mins level 1 for conditioning Cane exercises for shoulder flexion 2 sets of 10 reps, min v.c Reviewed theraband exercises for rowing and triceps ext 2 sets of 10 reps, min v.c with upgraded red band. Shoulder flexion and biceps curls 2 sets of 10 reps with 2 lbs weight in each hand, min v.c Pt attempted shoulder abduction with 2 lbs weight in each hand, pt was unable to achieve good positioning without compensation,  so task was discontinued. Education provided regarding recommendations for safe perfromance of lundry, using rollator to trnasport lundry bag. Therpist recommends pt has assist with heavier cleaning such as tub/ shower and vacuuming/ sweeping due to balance or she perfroms tasks in seated on rollator. Pt plans to pusrsue assistance.    10/01/23- Cane exercises for shoulder flexion, abduction, chest press 10 reps each, min v.c  Pt was educated regarding red theraband HEP, 10-15 reps each see pt instuctions. Pt has contacted her MD regarding tub bench but has not recieved yet.  09/10/23- eval completed, education initated regarding AE/ DME. Information provided regarding tub transfer bench                                                                                                                                 PATIENT EDUCATION: Education details:  red theraband and use of light 2 lbs weights, energy conservation techniques. Person educated: Patient Education method: Explanation and Handouts,  demonstration, v.c Education comprehension: verbalized understanding, returned demonstration  HOME EXERCISE PROGRAM: red theraband   GOALS: Goals reviewed with patient? Yes    LONG TERM GOALS: Target date: 11/12/23  I with HEP for UE strength Baseline: dependent Goal status:  ongoing, 10/01/23  2.  pt will verbalize understanding of adapted strategies for ADLS / IADLs to maximize safety and I with daily acitivities  Baseline: dependent Goal status:ongoing, pt was shown AE for LB dressing, recommendation for use of rollator to transport laundry.  3.  Pt will verbalize understanding of energy conservation techniques Baseline: dependent Goal status:met,  issued 10/08/23 4. Pt will increase bilateral shoulder flexion A/ROM by 5* for improved functional reach.  [place]; see above  Goal status:ongoing,    ASSESSMENT:  CLINICAL IMPRESSION: Pt is progressing towards goals with improving strength and mobility. She verbalizes understanding of energy conservation techniques. PERFORMANCE DEFICITS: in functional skills including ADLs, IADLs, strength, flexibility, mobility, balance, endurance, decreased knowledge of precautions, decreased knowledge of use of DME, and UE functional use, , and psychosocial skills including coping strategies, environmental adaptation, habits, interpersonal interactions, and routines and behaviors.   IMPAIRMENTS: are limiting patient from ADLs, IADLs, rest and sleep, work, play, leisure, and social participation.   COMORBIDITIES: may have co-morbidities  that affects occupational performance. Patient will benefit from skilled OT to address above impairments and improve overall function.  MODIFICATION OR ASSISTANCE TO COMPLETE EVALUATION: No modification of tasks or assist necessary to complete an evaluation.  OT OCCUPATIONAL PROFILE AND HISTORY: Detailed assessment: Review of records and additional review of physical, cognitive, psychosocial history related to  current functional performance.  CLINICAL DECISION MAKING: LOW - limited treatment options, no task modification necessary  REHAB POTENTIAL: Good  EVALUATION COMPLEXITY: Low      PLAN:  OT FREQUENCY:  3visits plus eval  OT DURATION: 8 weeks  PLANNED INTERVENTIONS: 97168 OT Re-evaluation, 97535 self care/ADL training, 47829 therapeutic exercise, 97530 therapeutic activity, 97140 manual therapy,  16109 ultrasound, 60454 paraffin, 97010 moist heat, 97010 cryotherapy, 97034 contrast bath, passive range of motion, balance training, functional mobility training, energy conservation, coping strategies training, patient/family education, and DME and/or AE instructions  RECOMMENDED OTHER SERVICES: PT  CONSULTED AND AGREED WITH PLAN OF CARE: Patient  PLAN FOR NEXT SESSION:  progress, HEP, check goals, d/c next visit   Kahdijah Errickson, OT 10/08/2023, 2:24 PM

## 2023-10-10 ENCOUNTER — Encounter: Payer: Self-pay | Admitting: General Practice

## 2023-10-10 ENCOUNTER — Ambulatory Visit: Payer: Medicaid Other | Attending: General Practice | Admitting: General Practice

## 2023-10-10 VITALS — BP 126/82 | HR 74 | Ht 60.0 in | Wt 382.2 lb

## 2023-10-10 DIAGNOSIS — E785 Hyperlipidemia, unspecified: Secondary | ICD-10-CM

## 2023-10-10 DIAGNOSIS — I10 Essential (primary) hypertension: Secondary | ICD-10-CM | POA: Diagnosis not present

## 2023-10-10 DIAGNOSIS — I272 Pulmonary hypertension, unspecified: Secondary | ICD-10-CM | POA: Diagnosis not present

## 2023-10-10 DIAGNOSIS — G4733 Obstructive sleep apnea (adult) (pediatric): Secondary | ICD-10-CM | POA: Diagnosis not present

## 2023-10-10 DIAGNOSIS — E66813 Obesity, class 3: Secondary | ICD-10-CM

## 2023-10-10 DIAGNOSIS — Z6841 Body Mass Index (BMI) 40.0 and over, adult: Secondary | ICD-10-CM

## 2023-10-10 DIAGNOSIS — I5032 Chronic diastolic (congestive) heart failure: Secondary | ICD-10-CM | POA: Diagnosis not present

## 2023-10-10 MED ORDER — ATORVASTATIN CALCIUM 40 MG PO TABS: 40.0000 mg | ORAL_TABLET | Freq: Every day | ORAL | 1 refills | Status: DC

## 2023-10-10 MED ORDER — FUROSEMIDE 80 MG PO TABS: 80.0000 mg | ORAL_TABLET | Freq: Every day | ORAL | 1 refills | Status: DC

## 2023-10-10 MED ORDER — POTASSIUM CHLORIDE CRYS ER 20 MEQ PO TBCR: 20.0000 meq | EXTENDED_RELEASE_TABLET | Freq: Every day | ORAL | 1 refills | Status: DC

## 2023-10-10 NOTE — Addendum Note (Signed)
 Addended by: Alyson Ingles on: 10/10/2023 04:43 PM   Modules accepted: Orders

## 2023-10-10 NOTE — Patient Instructions (Addendum)
 Medication Instructions:  The current medical regimen is effective;  continue present plan and medications as directed. Please refer to the Current Medication list given to you today.  If you need a refill on your cardiac medications before your next appointment, please call your pharmacy*  Lab Work: FASTING LIPID AND LFT IN 1 MONTH  BMET TODAY If you have labs (blood work) drawn today and your tests are completely normal, you will receive your results only by:  MyChart Message (if you have MyChart) OR  A paper copy in the mail If you have any lab test that is abnormal or we need to change your treatment, we will call you to review the results.  Other Instructions PLEASE READ AND FOLLOW ATTACHED  SALTY 6  FLUID RESTRICTION LESS THEN 48 OUNCES DAILY DAILY WEIGHTS  Follow-Up: At [facility], you and your health needs are our priority.  As part of our continuing mission to provide you with exceptional heart care, we have created designated Provider Care Teams.  These Care Teams include your primary Cardiologist (physician) and Advanced Practice Providers (APPs -  Physician Assistants and Nurse Practitioners) who all work together to provide you with the care you need, when you need it.  Your next appointment:   4-6 month(s)  Provider:   Norman Herrlich, MD  or Edd Fabian, FNP

## 2023-10-11 LAB — BASIC METABOLIC PANEL
BUN/Creatinine Ratio: 24 — ABNORMAL HIGH (ref 9–23)
BUN: 19 mg/dL (ref 6–24)
CO2: 26 mmol/L (ref 20–29)
Calcium: 9.4 mg/dL (ref 8.7–10.2)
Chloride: 101 mmol/L (ref 96–106)
Creatinine, Ser: 0.8 mg/dL (ref 0.57–1.00)
Glucose: 83 mg/dL (ref 70–99)
Potassium: 5.2 mmol/L (ref 3.5–5.2)
Sodium: 140 mmol/L (ref 134–144)
eGFR: 85 mL/min/{1.73_m2} (ref >59)

## 2023-10-14 NOTE — Therapy (Signed)
 OUTPATIENT PHYSICAL THERAPY LOWER EXTREMITY TREATMENT   Patient Name: Christina Dominguez MRN: [id number] DOB:1966-01-21, 58 y.o., female Today's Date: 10/15/2023  END OF SESSION:  PT End of Session - 10/15/23 1456     Visit Number 7    Date for PT Re-Evaluation 12/03/23    PT Start Time 1500    PT Stop Time 1545    PT Time Calculation (min) 45 min    Activity Tolerance Patient tolerated treatment well    Behavior During Therapy [facility] for tasks assessed/performed                 Past Medical History:  Diagnosis Date   Anxiety    Aortic atherosclerosis (HCC) 08/19/2021   Arthritis    Asthma    Bipolar 1 disorder (HCC)    Chronic diastolic CHF (congestive heart failure) (HCC)    Class 3 severe obesity with serious comorbidity and body mass index (BMI) greater than or equal to 70 in adult (HCC) 12/21/2019   Depression    Diastolic dysfunction 08/02/2021   Diverticulosis 08/19/2021   Edema of both lower extremities    Hepatic steatosis 08/19/2021   Hepatomegaly 08/19/2021   High blood pressure    Hyperlipidemia    Joint pain    Morbid obesity (HCC)    Pre-diabetes    Past Surgical History:  Procedure Laterality Date   APPLICATION OF WOUND VAC Left 12/20/2021   Procedure: APPLICATION OF WOUND VAC;  Surgeon: Nadara Mustard, MD;  Location: [facility] OR;  Service: Orthopedics;  Laterality: Left;   I & D EXTREMITY Left 12/20/2021   Procedure: EXCISIONAL DEBRIDEMENT HEMATOMA LEFT LEG;  Surgeon: Nadara Mustard, MD;  Location: [facility] OR;  Service: Orthopedics;  Laterality: Left;   NO PAST SURGERIES     RIGHT/LEFT HEART CATH AND CORONARY ANGIOGRAPHY N/A 10/06/2021   Procedure: RIGHT/LEFT HEART CATH AND CORONARY ANGIOGRAPHY;  Surgeon: Corky Crafts, MD;  Location: [facility] INVASIVE CV LAB;  Service: Cardiovascular;  Laterality: N/A;   Patient Active Problem List   Diagnosis Date Noted   Left leg cellulitis 06/20/2023   Cellulitis 06/19/2023   Cellulitis of lower extremity 06/18/2023    Transaminitis 06/06/2023   Chest pain 06/05/2023   Cellulitis of left lower extremity 06/04/2023   Nocturnal hypoxemia 07/03/2022   Arthritis 04/16/2022   Asthma 04/16/2022   Hematoma of left lower leg    Wheezing 11/23/2021   Snoring 11/23/2021   Other secondary pulmonary hypertension (HCC) 11/23/2021   Hyponatremia 10/11/2021   Dyslipidemia 10/04/2021   Mediastinal mass 10/04/2021   Obstructive sleep apnea 08/25/2021   RUQ pain 08/19/2021   Hepatic steatosis 08/19/2021   Hepatomegaly 08/19/2021   Aortic atherosclerosis (HCC) 08/19/2021   Diverticulosis 08/19/2021   Anxiety 08/02/2021   Bipolar 1 disorder (HCC) 08/02/2021   Chronic diastolic CHF (congestive heart failure) (HCC) 08/02/2021   Depression 08/02/2021   Joint pain 08/02/2021   Screening for malignant neoplasm of colon 08/02/2021   Diastolic dysfunction 08/02/2021   Prediabetes 12/22/2019   Vitamin D insufficiency 12/22/2019   Class 3 severe obesity with serious comorbidity and body mass index (BMI) greater than or equal to 60 in adult [facility]) 12/21/2019   Essential hypertension 04/30/2019   Mild intermittent asthma without complication 04/30/2019    PCP: Dr. Ila Mcgill  REFERRING PROVIDER: Dr. Joen Laura  REFERRING DIAG: Weakness  THERAPY DIAG:  Muscle weakness (generalized)  Other abnormalities of gait and mobility  Unsteadiness on feet  Abnormal gait  Physical  deconditioning  Rationale for Evaluation and Treatment: Rehabilitation  ONSET DATE: 06/08/23 - referral date  SUBJECTIVE:   SUBJECTIVE STATEMENT:  Things have been okay. I have been getting up at home better in some way. Can get out the couch easier.    PERTINENT HISTORY: Aortic atherosclerosis Bipolar CHF Severe Obesity Bilateral LE Edema Hypertension Hyperlipidemia Prediabetes Asthma arthritis PSH: heart cath, L leg excisional debridement hematoma PAIN:  Are you having pain? Yes: NPRS scale: 3/10  Pain  location: Knees R right hip Pain description: pulling, sharp pain Aggravating factors: prolonged standing Relieving factors: [place] Described feeling pain in knees and ankles generally  PRECAUTIONS: Fall  RED FLAGS: None   WEIGHT BEARING RESTRICTIONS: No  FALLS:  Has patient fallen in last 6 months? 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  DIAGNOSTIC FINDINGS: none recent   COGNITION: Overall cognitive status: Within functional limits for tasks assessed     SENSATION: WFL  POSTURE: rounded shoulders, increased lumbar lordosis, anterior pelvic tilt, and flexed trunk   LOWER EXTREMITY ROM: resting pain 4/10, 8/10 with movements  Active ROM Right eval Left eval  Hip flexion [facility]  Hip extension    Hip abduction    Hip adduction    Hip internal rotation    Hip external rotation    Knee flexion [facility]  Knee extension [facility]  Ankle dorsiflexion [facility]  Ankle plantarflexion [facility]  Ankle inversion    Ankle eversion     (Blank rows = not tested)  LOWER EXTREMITY MMT:  MMT Right eval Left eval Right 10/08/23 Left 10/08/23  Hip flexion 4-* 3+* 4 4  Hip extension      Hip abduction 4* 4* 4+ 4+  Hip adduction 4* 4*    Hip internal rotation      Hip external rotation      Knee flexion   4 4-  Knee extension 3+* 3+* 4 4  Ankle dorsiflexion 5 5    Ankle plantarflexion      Ankle inversion      Ankle eversion       (Blank rows = not tested)  FUNCTIONAL TESTS:  30 seconds chair stand test Timed up and go (TUG):  26.37 30 second STS: 4    GAIT: Distance walked: in clinic distances Assistive device utilized: Environmental consultant - 2 wheeled Level of assistance: Modified independence                                                                                                   TREATMENT DATE:  10/15/23 Walking laps with 2# weights  Marching 2# 2x10 LAQ 2# 2x10 Seated calf raises 2x12 Seated toe raises 2x12  Seated rows red 2x10 HS curls red 2x10 Mini squats x10   10/08/23  MMT update (see above)  Nustep L4x6 minutes (breaks every 2 minutes) Gait x3 laps with RW (untimed, no breaks) 2 lap walk walk with 1# each leg   LAQ 1# x10 B alternating Chest press yellow ball x12 Seated marches 1# x10 B alternating  OH press 2# each UE x10 B alternating   Sitting UE ABD 2# each UE x12    10/01/23 NuStep L4 x5 mins, break at 2 mins  Seated march 20 reps alt x2  Standing march with walker 20 reps x2 Hip abd against red band 2x10 Walking laps 2 mins- able to complete 2 laps without a break  Seated OHP yellow ball 2x10 Seated chest press with yellow ball 2x10  09/26/23 LAQ 2# 2x5 HS curls red 2x8 Seated row with red band 2x10  STS 2x3 with UE help needed Fitter pushes 1 blue band 2x10  09/23/23 S2S x5 pt uses momentum leans to her R using RUE extending knees and pursing against table, Then x3 less momentum both Ue's limiting the push against table increase knee pain.   LAQ 1.5lb 2x10 Seated March 2x10 Seated OHP yellow ball 2x10 Seated HS curls yellow 2x10  09/19/23 NuStep L3 x 4 min, broken up into 2 minute intervals HS Curls, red band, 2x8 Seated Marches, 2x20 LAQ, 2x10  09/10/23 EVAL    PATIENT EDUCATION:  Education details: POC and HEP Person educated: Patient Education method: Medical illustrator Education comprehension: verbalized understanding and returned demonstration  HOME EXERCISE PROGRAM: Access Code: [email or web address] URL: [email or web address] Date: 09/10/2023  Prepared by: Cassie Freer Exercises  - Seated March  - 1 x daily - 7 x weekly - 2 sets - 10 reps  - Seated Long Arc Quad  - 1 x daily - 7 x weekly - 2 sets - 10 reps  - Seated Hip Adduction Isometrics with Ball  - 1 x daily - 7 x weekly - 2 sets - 10 reps  - Seated Heel Raise  - 1 x daily - 7 x weekly - 2 sets - 10 reps   ASSESSMENT:  CLINICAL IMPRESSION: Pt arrives today doing pretty well, reports getting up and down has gotten some easier. We continued working on functional strengthening and functional activity tolerance as tolerated. Difficulty with mini squats because she is unable to bend her knees. Tried to adjust by tapping the table instead almost like doing a hip hinge. Reports pain in knees.     OBJECTIVE IMPAIRMENTS: Abnormal gait, cardiopulmonary status limiting activity, decreased activity tolerance, decreased balance, decreased endurance, difficulty walking, decreased strength, improper body mechanics, postural dysfunction, obesity, and pain.   ACTIVITY LIMITATIONS: carrying, lifting, bending, sitting, standing, squatting, and stairs  PARTICIPATION LIMITATIONS: meal prep, cleaning, laundry, driving, shopping, community activity, occupation, and yard work  PERSONAL FACTORS: Age, Behavior pattern, Education, Fitness, and Time since onset of injury/illness/exacerbation are also affecting patient's functional outcome.   REHAB POTENTIAL: Good  CLINICAL DECISION MAKING: Stable/uncomplicated  EVALUATION COMPLEXITY: Low   GOALS: Goals reviewed with patient? Yes  SHORT TERM GOALS: Target date: 10/22/23  Patient will be independent with initial HEP. Baseline: given 09/10/23 Goal status: INITIAL   LONG TERM GOALS: Target date:12/03/23  Patient will be independent with advanced/ongoing HEP to improve outcomes and carryover.  Baseline:  Goal status: INITIAL  2.  Patient will report at least 75% improvement in bilateral LE pain to improve QOL. Baseline: 4/10 resting, 8/10 with  movement (09/10/23) Goal status: INITIAL  3.  Patient will increase standing tolerance to 10 mins or longer to be able to complete household chores. Baseline: unable to do Goal status: INITIAL  4.  Patient will be able to ambulate 200' with LRAD and normal gait pattern without increased pain to access community.  Baseline: TBD Goal status: INITIAL  5.  Patient will perform 7 STS in 30 seconds Baseline: 4 (09/10/23) Goal status: INITIAL   PLAN:  PT FREQUENCY: 2x/week  PT DURATION: 12 weeks  PLANNED INTERVENTIONS: 97110-Therapeutic exercises, 97530- Therapeutic activity, [id number]- Neuromuscular re-education, 97535- Self Care, 91478- Manual therapy, [id number]- Gait training, [phone]- Aquatic Therapy, Stair training, DME instructions, Cryotherapy, and Moist heat  PLAN FOR NEXT SESSION: bilateral LE strengthening, improve muscular and cardiovascular endurance, progressions PRN   Cassie Freer, PT, DPT 10/15/23 3:45 PM

## 2023-10-15 ENCOUNTER — Ambulatory Visit: Payer: Medicaid Other

## 2023-10-15 DIAGNOSIS — M6281 Muscle weakness (generalized): Secondary | ICD-10-CM

## 2023-10-15 DIAGNOSIS — R269 Unspecified abnormalities of gait and mobility: Secondary | ICD-10-CM

## 2023-10-15 DIAGNOSIS — R5381 Other malaise: Secondary | ICD-10-CM

## 2023-10-15 DIAGNOSIS — R2689 Other abnormalities of gait and mobility: Secondary | ICD-10-CM

## 2023-10-15 DIAGNOSIS — R2681 Unsteadiness on feet: Secondary | ICD-10-CM

## 2023-10-16 ENCOUNTER — Other Ambulatory Visit (HOSPITAL_BASED_OUTPATIENT_CLINIC_OR_DEPARTMENT_OTHER): Payer: Self-pay

## 2023-10-16 ENCOUNTER — Encounter (HOSPITAL_BASED_OUTPATIENT_CLINIC_OR_DEPARTMENT_OTHER): Payer: Self-pay | Admitting: Pulmonary Disease

## 2023-10-16 ENCOUNTER — Other Ambulatory Visit: Payer: Self-pay

## 2023-10-16 ENCOUNTER — Telehealth: Payer: Self-pay | Admitting: Physician Assistant

## 2023-10-16 DIAGNOSIS — G4733 Obstructive sleep apnea (adult) (pediatric): Secondary | ICD-10-CM

## 2023-10-16 NOTE — Telephone Encounter (Signed)
 [facility] faxed Home Health Certificate (Order [place] [id number]), to be filled out by provider. Patient requested to send it back via Fax within 5-days. Document is located in providers tray at front office.Please advise at  [phone].

## 2023-10-16 NOTE — Patient Outreach (Signed)
 Medicaid Managed Care Social Work Note  10/16/2023 Name:  Christina Dominguez MRN:  [id number] DOB:  Dec 31, 1965  Christina Dominguez is an 58 y.o. year old female who is a primary patient of Allwardt, Alyssa M, PA-C.  The [facility] Managed Care Coordination team was consulted for assistance with:   PCS  Ms. Plant was given information about Medicaid Managed Care Coordination team services today. Christina Dominguez Patient agreed to services and verbal consent obtained.  Engaged with patient  for by telephone forfollow up visit in response to referral for case management and/or care coordination services.   Patient is participating in a Managed Medicaid Plan:  Yes  Assessments/Interventions:  Review of past medical history, allergies, medications, health status, including review of consultants reports, laboratory and other test data, was performed as part of comprehensive evaluation and provision of chronic care management services.  SDOH: (Social Drivers of Health) assessments and interventions performed: SDOH Interventions    Flowsheet Row Patient Outreach Telephone from 09/02/2023 in [place] POPULATION HEALTH DEPARTMENT Patient Outreach Telephone from 07/15/2023 in [place] POPULATION HEALTH DEPARTMENT Telephone from 06/11/2023 in  POPULATION HEALTH DEPARTMENT ED to Hosp-Admission (Discharged) from 10/03/2021 in [place] 2C CV PROGRESSIVE CARE  SDOH Interventions      Food Insecurity Interventions Intervention Not Indicated Intervention Not Indicated Intervention Not Indicated Intervention Not Indicated  Housing Interventions Intervention Not Indicated Other (Comment)  [Patient has discussed the issue with the apartment manager] -- Intervention Not Indicated  Transportation Interventions Payor Benefit -- Intervention Not Indicated Intervention Not Indicated  Utilities Interventions Intervention Not Indicated Patient Declined -- --  Financial Strain Interventions -- -- --  Other (Comment)  [May need to consider disability]     BSW completed a telephone outreach with patient, BSW received a message from PCP stating that she had not seen patient. Patient states she no longer needs PCS, and is able to do basic activities on her own. Patient states she has an appointment with PCP coming up to get a transfer chair. Patient states no additional resources are needed at this time. BSW provided contact information for any future needs.   Advanced Directives Status:  Not addressed in this encounter.  Care Plan                 Allergies  Allergen Reactions   Aspirin Nausea And Vomiting   Egg-Derived Products Swelling   Influenza Vaccines Swelling    Medications Reviewed Today   Medications were not reviewed in this encounter     Patient Active Problem List   Diagnosis Date Noted   Left leg cellulitis 06/20/2023   Cellulitis 06/19/2023   Cellulitis of lower extremity 06/18/2023   Transaminitis 06/06/2023   Chest pain 06/05/2023   Cellulitis of left lower extremity 06/04/2023   Nocturnal hypoxemia 07/03/2022   Arthritis 04/16/2022   Asthma 04/16/2022   Hematoma of left lower leg    Wheezing 11/23/2021   Snoring 11/23/2021   Other secondary pulmonary hypertension (HCC) 11/23/2021   Hyponatremia 10/11/2021   Dyslipidemia 10/04/2021   Mediastinal mass 10/04/2021   Obstructive sleep apnea 08/25/2021   RUQ pain 08/19/2021   Hepatic steatosis 08/19/2021   Hepatomegaly 08/19/2021   Aortic atherosclerosis (HCC) 08/19/2021   Diverticulosis 08/19/2021   Anxiety 08/02/2021   Bipolar 1 disorder (HCC) 08/02/2021   Chronic diastolic CHF (congestive heart failure) (HCC) 08/02/2021   Depression 08/02/2021   Joint pain 08/02/2021   Screening for malignant neoplasm of colon 08/02/2021  Diastolic dysfunction 08/02/2021   Prediabetes 12/22/2019   Vitamin D insufficiency 12/22/2019   Class 3 severe obesity with serious comorbidity and body mass index (BMI) greater  than or equal to 60 in adult [facility]) 12/21/2019   Essential hypertension 04/30/2019   Mild intermittent asthma without complication 04/30/2019    Conditions to be addressed/monitored per PCP order:   PCS  There are no care plans that you recently modified to display for this patient.   Follow up:  Patient agrees to Care Plan and Follow-up.  Plan: The  Patient has been provided with contact information for the Managed Medicaid care management team and has been advised to call with any health related questions or concerns.    Abelino Derrick, MHA [facility]  Managed [facility] Social Worker [phone]

## 2023-10-16 NOTE — Progress Notes (Signed)
 Order placed

## 2023-10-16 NOTE — Patient Instructions (Signed)
 Visit Information  Christina Dominguez was given information about Medicaid Managed Care team care coordination services as a part of their [facility] Medicaid benefit. [name] verbally consented to engagement with the [facility] Managed Care team.   If you are experiencing a medical emergency, please call 911 or report to your local emergency department or urgent care.   If you have a non-emergency medical problem during routine business hours, please contact your provider's office and ask to speak with a nurse.   For questions related to your [facility], please call: [phone] or visit the homepage here: [email or web address]  If you would like to schedule transportation through your [facility], please call the following number at least 2 days in advance of your appointment: [phone]   Rides for urgent appointments can also be made after hours by calling Member Services.  Call the Behavioral Health Crisis Line at [phone], at any time, 24 hours a day, 7 days a week. If you are in danger or need immediate medical attention call 911.  If you would like help to quit smoking, call 1-800-QUIT-NOW (334-720-7142) OR Espaol: 1-855-Djelo-Ya (1-324-401-0272) o para ms informacin haga clic aqu or Text READY to 536-644 to register via text  Ms. Witherell - following are the goals we discussed in your visit today:   Goals Addressed   None      The  Patient                                              has been provided with contact information for the Managed Medicaid care management team and has been advised to call with any health related questions or concerns.   [name], [name], MHA [facility]  Managed Medicaid Social Worker [phone]   Following is a copy of your plan of care:  There are no care plans that you recently modified to display  for this patient.

## 2023-10-17 ENCOUNTER — Ambulatory Visit: Payer: Medicaid Other | Admitting: Physical Therapy

## 2023-10-17 NOTE — Telephone Encounter (Signed)
 Noted and agreed, thank you. Will address at o/v.

## 2023-10-17 NOTE — Telephone Encounter (Signed)
 Form in provider box; patient scheduled for appt 10/21/23

## 2023-10-21 ENCOUNTER — Ambulatory Visit (INDEPENDENT_AMBULATORY_CARE_PROVIDER_SITE_OTHER): Payer: Medicaid Other | Admitting: Physician Assistant

## 2023-10-21 ENCOUNTER — Encounter: Payer: Self-pay | Admitting: Physician Assistant

## 2023-10-21 VITALS — BP 138/86 | HR 67 | Temp 97.5°F | Ht 60.0 in | Wt 392.6 lb

## 2023-10-21 DIAGNOSIS — E559 Vitamin D deficiency, unspecified: Secondary | ICD-10-CM

## 2023-10-21 DIAGNOSIS — G4733 Obstructive sleep apnea (adult) (pediatric): Secondary | ICD-10-CM | POA: Diagnosis not present

## 2023-10-21 DIAGNOSIS — G479 Sleep disorder, unspecified: Secondary | ICD-10-CM

## 2023-10-21 DIAGNOSIS — R2681 Unsteadiness on feet: Secondary | ICD-10-CM

## 2023-10-21 DIAGNOSIS — Z6841 Body Mass Index (BMI) 40.0 and over, adult: Secondary | ICD-10-CM

## 2023-10-21 DIAGNOSIS — H6123 Impacted cerumen, bilateral: Secondary | ICD-10-CM | POA: Diagnosis not present

## 2023-10-21 DIAGNOSIS — E785 Hyperlipidemia, unspecified: Secondary | ICD-10-CM

## 2023-10-21 DIAGNOSIS — R2689 Other abnormalities of gait and mobility: Secondary | ICD-10-CM | POA: Diagnosis not present

## 2023-10-21 DIAGNOSIS — R928 Other abnormal and inconclusive findings on diagnostic imaging of breast: Secondary | ICD-10-CM

## 2023-10-21 DIAGNOSIS — E66813 Obesity, class 3: Secondary | ICD-10-CM

## 2023-10-21 DIAGNOSIS — R5383 Other fatigue: Secondary | ICD-10-CM

## 2023-10-21 NOTE — Patient Instructions (Signed)
 VISIT SUMMARY:  Today, we discussed your ongoing difficulty sleeping, which is primarily due to your obstructive sleep apnea. We also reviewed your management plan for bipolar disorder, ear wax impaction, and general health maintenance. Your efforts to improve your sleep consistency and reduce alcohol intake are commendable.  YOUR PLAN:  -OBSTRUCTIVE SLEEP APNEA: Obstructive sleep apnea is a condition where your airway becomes blocked during sleep, causing breathing interruptions. We are awaiting the delivery of your CPAP machine, which will help keep your airway open. You are encouraged to set a regular bedtime and avoid alcohol and caffeine to improve sleep quality. A referral to a sleep specialist has been made for further evaluation.  -EAR WAX IMPACTION: Ear wax impaction occurs when ear wax builds up and hardens, causing discomfort. You are advised to use Debrox drops to soften the ear wax and we will re-evaluate in 3-4 weeks to see if it can be flushed out.  -HYPERTENSION: Hypertension is high blood pressure. You are currently under the care of a cardiologist for this condition.  -PULMONARY HYPERTENSION: Pulmonary hypertension is high blood pressure in the lungs. You are under the care of a pulmonologist for this condition.  -CONGESTIVE HEART FAILURE: Congestive heart failure is a condition where the heart does not pump blood as well as it should. You are under the care of a cardiologist for this condition.  -GENERAL HEALTH MAINTENANCE: We discussed the importance of lifestyle modifications, such as reducing alcohol intake, increasing physical activity, and regulating your circadian rhythm. A mammogram has been ordered, and we will coordinate care for your transportation and home health needs. Continue to engage in outdoor activities and get regular sunlight exposure.  INSTRUCTIONS:  Please follow up in 3 months to assess your progress. We will perform blood work to check your cholesterol,  thyroid, and A1c levels. Ensure coordination of care.

## 2023-10-21 NOTE — Therapy (Signed)
 OUTPATIENT PHYSICAL THERAPY LOWER EXTREMITY TREATMENT   Patient Name: Christina Dominguez MRN: [id number] DOB:1966/06/12, 58 y.o., female Today's Date: 10/22/2023  END OF SESSION:  PT End of Session - 10/22/23 1509     Visit Number 8    Date for PT Re-Evaluation 12/03/23    PT Start Time 1509    PT Stop Time 1545    PT Time Calculation (min) 36 min    Activity Tolerance Patient tolerated treatment well    Behavior During Therapy [facility] for tasks assessed/performed                  Past Medical History:  Diagnosis Date   Anxiety    Aortic atherosclerosis (HCC) 08/19/2021   Arthritis    Asthma    Bipolar 1 disorder (HCC)    Chronic diastolic CHF (congestive heart failure) (HCC)    Class 3 severe obesity with serious comorbidity and body mass index (BMI) greater than or equal to 70 in adult (HCC) 12/21/2019   Depression    Diastolic dysfunction 08/02/2021   Diverticulosis 08/19/2021   Edema of both lower extremities    Hepatic steatosis 08/19/2021   Hepatomegaly 08/19/2021   High blood pressure    Hyperlipidemia    Joint pain    Morbid obesity (HCC)    Pre-diabetes    Past Surgical History:  Procedure Laterality Date   APPLICATION OF WOUND VAC Left 12/20/2021   Procedure: APPLICATION OF WOUND VAC;  Surgeon: Nadara Mustard, MD;  Location: [facility] OR;  Service: Orthopedics;  Laterality: Left;   I & D EXTREMITY Left 12/20/2021   Procedure: EXCISIONAL DEBRIDEMENT HEMATOMA LEFT LEG;  Surgeon: Nadara Mustard, MD;  Location: [facility] OR;  Service: Orthopedics;  Laterality: Left;   NO PAST SURGERIES     RIGHT/LEFT HEART CATH AND CORONARY ANGIOGRAPHY N/A 10/06/2021   Procedure: RIGHT/LEFT HEART CATH AND CORONARY ANGIOGRAPHY;  Surgeon: Corky Crafts, MD;  Location: [facility] INVASIVE CV LAB;  Service: Cardiovascular;  Laterality: N/A;   Patient Active Problem List   Diagnosis Date Noted   Hyperlipidemia 08/30/2023   Class 3 obesity 07/30/2023   Impaired mobility and ADLs 06/30/2023    Lumbar radiculopathy 06/30/2023   Lymphedema 06/30/2023   Left leg cellulitis 06/20/2023   Cellulitis 06/19/2023   Cellulitis of lower extremity 06/18/2023   Transaminitis 06/06/2023   Chest pain 06/05/2023   Cellulitis of left lower extremity 06/04/2023   Nocturnal hypoxemia 07/03/2022   Arthritis 04/16/2022   Asthma 04/16/2022   Hematoma of left lower leg    Wheezing 11/23/2021   Snoring 11/23/2021   Other secondary pulmonary hypertension (HCC) 11/23/2021   Hyponatremia 10/11/2021   Dyslipidemia 10/04/2021   Mediastinal mass 10/04/2021   Obstructive sleep apnea 08/25/2021   RUQ pain 08/19/2021   Hepatic steatosis 08/19/2021   Hepatomegaly 08/19/2021   Aortic atherosclerosis (HCC) 08/19/2021   Diverticulosis 08/19/2021   Anxiety 08/02/2021   Bipolar 1 disorder (HCC) 08/02/2021   Chronic diastolic CHF (congestive heart failure) (HCC) 08/02/2021   Depression 08/02/2021   Joint pain 08/02/2021   Screening for malignant neoplasm of colon 08/02/2021   Diastolic dysfunction 08/02/2021   Heart failure, unspecified (HCC) 08/02/2021   Prediabetes 12/22/2019   Vitamin D insufficiency 12/22/2019   Class 3 severe obesity with serious comorbidity and body mass index (BMI) greater than or equal to 60 in adult [facility]) 12/21/2019   Essential hypertension 04/30/2019   Mild intermittent asthma without complication 04/30/2019    PCP: Dr. Arlyss Repress  Allwardt  REFERRING PROVIDER: Dr. Joen Laura  REFERRING DIAG: Weakness  THERAPY DIAG:  Muscle weakness (generalized)  Physical deconditioning  Other abnormalities of gait and mobility  Impaired functional mobility and endurance  Unsteadiness on feet  Abnormal gait  Pain in both lower extremities  Rationale for Evaluation and Treatment: Rehabilitation  ONSET DATE: 06/08/23 - referral date  SUBJECTIVE:   SUBJECTIVE STATEMENT:  I fell on Wednesday, trying to get into the bed. I got bruised up. The ambulance had to come get  me up. Have a bad bruise on my arm.   PERTINENT HISTORY: Aortic atherosclerosis Bipolar CHF Severe Obesity Bilateral LE Edema Hypertension Hyperlipidemia Prediabetes Asthma arthritis PSH: heart cath, L leg excisional debridement hematoma PAIN:  Are you having pain? Yes: NPRS scale: 3/10  Pain location: Knees R right hip Pain description: pulling, sharp pain Aggravating factors: prolonged standing Relieving factors: [place] Described feeling pain in knees and ankles generally  PRECAUTIONS: Fall  RED FLAGS: None   WEIGHT BEARING RESTRICTIONS: No  FALLS:  Has patient fallen in last 6 months? 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  DIAGNOSTIC FINDINGS: none recent   COGNITION: Overall cognitive status: Within functional limits for tasks assessed     SENSATION: WFL  POSTURE: rounded shoulders, increased lumbar lordosis, anterior pelvic tilt, and flexed trunk   LOWER EXTREMITY ROM: resting pain 4/10, 8/10 with movements  Active ROM Right eval Left eval  Hip flexion [facility]  Hip extension    Hip abduction    Hip adduction    Hip internal rotation    Hip external rotation    Knee flexion [facility]  Knee extension [facility]  Ankle dorsiflexion [facility]  Ankle plantarflexion [facility]  Ankle inversion    Ankle eversion     (Blank rows = not tested)  LOWER EXTREMITY MMT:  MMT Right eval Left eval Right 10/08/23 Left 10/08/23  Hip flexion 4-* 3+* 4 4  Hip extension      Hip abduction 4* 4* 4+ 4+  Hip adduction 4* 4*     Hip internal rotation      Hip external rotation      Knee flexion   4 4-  Knee extension 3+* 3+* 4 4  Ankle dorsiflexion 5 5    Ankle plantarflexion      Ankle inversion      Ankle eversion       (Blank rows = not tested)  FUNCTIONAL TESTS:  30 seconds chair stand test Timed up and go (TUG): 26.37 30 second STS: 4    GAIT: Distance walked: in clinic distances Assistive device utilized: Walker - 2 wheeled Level of assistance: Modified independence  TREATMENT DATE:  10/22/23 NuStep L5x75mins  Fitter pushes 2x10 1 blue and 1 black band  Seated marches 2# 2x10 Seated 4" box taps laterally 2# 2x10 Seated horizontal abd red 2x10 Chest press yellow ball 2x10  OHP yellow ball 2x10   10/15/23 Walking laps with 2# weights  Marching 2# 2x10 LAQ 2# 2x10 Seated calf raises 2x12 Seated toe raises 2x12 Seated rows red 2x10 HS curls red 2x10 Mini squats x10   10/08/23  MMT update (see above)  Nustep L4x6 minutes (breaks every 2 minutes) Gait x3 laps with RW (untimed, no breaks) 2 lap walk walk with 1# each leg   LAQ 1# x10 B alternating Chest press yellow ball x12 Seated marches 1# x10 B alternating  OH press 2# each UE x10 B alternating   Sitting UE ABD 2# each UE x12    10/01/23 NuStep L4 x5 mins, break at 2 mins  Seated march 20 reps alt x2  Standing march with walker 20 reps x2 Hip abd against red band 2x10 Walking laps 2 mins- able to complete 2 laps without a break  Seated OHP yellow ball 2x10 Seated chest press with yellow ball 2x10  09/26/23 LAQ 2# 2x5 HS curls red 2x8 Seated row with red band 2x10  STS 2x3 with UE help needed Fitter pushes 1 blue band 2x10  09/23/23 S2S x5 pt uses momentum leans to her R using RUE extending knees and pursing against table, Then x3 less momentum both Ue's limiting the push against table increase knee pain.   LAQ 1.5lb  2x10 Seated March 2x10 Seated OHP yellow ball 2x10 Seated HS curls yellow 2x10  09/19/23 NuStep L3 x 4 min, broken up into 2 minute intervals HS Curls, red band, 2x8 Seated Marches, 2x20 LAQ, 2x10  09/10/23 EVAL    PATIENT EDUCATION:  Education details: POC and HEP Person educated: Patient Education method: Medical illustrator Education comprehension: verbalized understanding and returned demonstration  HOME EXERCISE PROGRAM: Access Code: [email or web address] URL: [email or web address] Date: 09/10/2023 Prepared by: Cassie Freer Exercises  - Seated March  - 1 x daily - 7 x weekly - 2 sets - 10 reps  - Seated Long Arc Quad  - 1 x daily - 7 x weekly - 2 sets - 10 reps  - Seated Hip Adduction Isometrics with Ball  - 1 x daily - 7 x weekly - 2 sets - 10 reps  - Seated Heel Raise  - 1 x daily - 7 x weekly - 2 sets - 10 reps   ASSESSMENT:  CLINICAL IMPRESSION: Pt arrives today doing pretty well, she had a fall  Wednesday trying to get in the bed. Had to call the ambulance to help her get up. She was a few mins late today. We continued working on functional strengthening and functional activity tolerance as tolerated. Reports pain in R knee with fitter pushes.   OBJECTIVE IMPAIRMENTS: Abnormal gait, cardiopulmonary status limiting activity, decreased activity tolerance, decreased balance, decreased endurance, difficulty walking, decreased strength, improper body mechanics, postural dysfunction, obesity, and pain.   ACTIVITY LIMITATIONS: carrying, lifting, bending, sitting, standing, squatting, and stairs  PARTICIPATION LIMITATIONS: meal prep, cleaning, laundry, driving, shopping, community activity, occupation, and yard work  PERSONAL FACTORS: Age, Behavior pattern, Education, Fitness, and Time since onset of injury/illness/exacerbation are also affecting patient's functional outcome.   REHAB POTENTIAL: Good  CLINICAL DECISION MAKING: Stable/uncomplicated  EVALUATION  COMPLEXITY: Low   GOALS: Goals reviewed with patient? Yes  SHORT TERM GOALS: Target date:  10/22/23  Patient will be independent with initial HEP. Baseline: given 09/10/23 Goal status: INITIAL   LONG TERM GOALS: Target date:12/03/23  Patient will be independent with advanced/ongoing HEP to improve outcomes and carryover.  Baseline:  Goal status: INITIAL  2.  Patient will report at least 75% improvement in bilateral LE pain to improve QOL. Baseline: 4/10 resting, 8/10 with movement (09/10/23) Goal status: INITIAL  3.  Patient will increase standing tolerance to 10 mins or longer to be able to complete household chores. Baseline: unable to do Goal status: INITIAL  4.  Patient will be able to ambulate 200' with LRAD and normal gait pattern without increased pain to access community.  Baseline: TBD Goal status: INITIAL  5.  Patient will perform 7 STS in 30 seconds Baseline: 4 (09/10/23) Goal status: INITIAL   PLAN:  PT FREQUENCY: 2x/week  PT DURATION: 12 weeks  PLANNED INTERVENTIONS: 97110-Therapeutic exercises, 97530- Therapeutic activity, [id number]- Neuromuscular re-education, 97535- Self Care, 40981- Manual therapy, [id number]- Gait training, [id number]- Aquatic Therapy, Stair training, DME instructions, Cryotherapy, and Moist heat  PLAN FOR NEXT SESSION: bilateral LE strengthening, improve muscular and cardiovascular endurance, progressions PRN   Cassie Freer, PT, DPT 10/22/23 3:48 PM

## 2023-10-21 NOTE — Progress Notes (Unsigned)
 Patient ID: Christina Dominguez, female    DOB: [birth year], 58 y.o.   MRN: [id number]   Assessment & Plan:  Decreased mobility -     AMB Referral [facility] Care Management -     For home use only DME Other see comment  Class 3 severe obesity due to excess calories with serious comorbidity and body mass index (BMI) greater than or equal to 70 in adult (HCC) -     AMB Referral [facility] Care Management -     CBC with Differential/Platelet -     Comprehensive metabolic panel -     Lipid panel -     TSH -     Hemoglobin A1c -     Vitamin B12 -     VITAMIN D 25 Hydroxy (Vit-D Deficiency, Fractures)  Abnormal mammogram -     MM 3D DIAGNOSTIC MAMMOGRAM BILATERAL BREAST; Future  OSA (obstructive sleep apnea) -     Ambulatory referral to Sleep Studies -     CBC with Differential/Platelet -     Comprehensive metabolic panel -     Lipid panel -     TSH -     Hemoglobin A1c -     Vitamin B12 -     VITAMIN D 25 Hydroxy (Vit-D Deficiency, Fractures)  Sleep difficulties -     Ambulatory referral to Sleep Studies -     CBC with Differential/Platelet -     Comprehensive metabolic panel -     Lipid panel -     TSH -     Hemoglobin A1c -     Vitamin B12 -     VITAMIN D 25 Hydroxy (Vit-D Deficiency, Fractures)  Gait instability -     For home use only DME Other see comment  Hyperlipidemia, unspecified hyperlipidemia type -     Lipid panel  Vitamin D insufficiency -     VITAMIN D 25 Hydroxy (Vit-D Deficiency, Fractures)  Other fatigue -     CBC with Differential/Platelet -     Comprehensive metabolic panel -     Lipid panel -     TSH -     Hemoglobin A1c -     Vitamin B12 -     VITAMIN D 25 Hydroxy (Vit-D Deficiency, Fractures)  Bilateral impacted cerumen   Assessment and Plan    Obstructive Sleep Apnea Confirmed by sleep studies, causing sleep disturbances and affecting weight loss. Alcohol and caffeine intake reduced. Sleep consistency improving. - Await CPAP machine delivery  and setup. - Refer to sleep specialist for further evaluation and management. - Encourage setting a regular bedtime and improving sleep consistency. - Advise against alcohol and caffeine consumption.   Ear Wax Impaction Ear discomfort due to hard, built-up wax in both ears. - Recommend Debrox drops to soften ear wax. - Re-evaluate in 3-4 weeks to assess if ear wax can be flushed.  Hypertension Under cardiologist care. High-normal reading today Will continue Valsartan 40 mg  Pulmonary Hypertension Under pulmonologist care.  Congestive Heart Failure Under cardiologist care.  General Health Maintenance Focused on lifestyle modifications, reducing alcohol, increasing physical activity, and regulating circadian rhythm. - Order mammogram. - Coordinate care for transportation and home health needs. - Very high Fall and hospital readmission risk - will complete paperwork for home health - agree 100% for need for continued home PT - Encourage outdoor activity and exposure to sunlight.  Update blood work today     Return in about  3 months (around 01/21/2024) for recheck/follow-up.    Subjective:    Chief Complaint  Patient presents with   Medical Management of Chronic Issues    Pt in office to discuss orders needed for [employer] for transfer; pt has been hospitalized 3 times since seeing PCP; pt has cellulitis; weaping wound; also hurt her back; Pt doing OT 1 monthly; PT 2 times weekly; pt finally getting CPAP machine delivered today;     HPI Discussed the use of AI scribe software for clinical note transcription with the patient, who gave verbal consent to proceed.  History of Present Illness   Christina Dominguez is a 58 year old female with obstructive sleep apnea who presents with difficulty sleeping.  She experiences significant difficulty sleeping, which she attributes to her obstructive sleep apnea. Despite undergoing two sleep studies and awaiting the delivery of a  CPAP machine, she continues to struggle with sleep consistency and quality. Her sleep pattern is erratic, often going to bed at 2 AM and sleeping for four to five hours straight, but waking frequently throughout the night. She finds it challenging to fall asleep before 12:45 AM. No daytime napping is reported.  She is working with a nutritionist and a bariatric specialist to address weight loss, which she believes is negatively impacted by her poor sleep. She has started walking outside instead of using a treadmill and is trying to expose herself to sunlight more regularly to help regulate her circadian rhythm. She has significantly reduced her alcohol consumption, which she previously used to manage racing thoughts associated with her bipolar disorder, and rarely consumes caffeine.  She has tried melatonin in the past without success.  Her past medical history includes congestive heart failure, pulmonary hypertension, hypertension, and bipolar disorder. She has been hospitalized recently and has moved, which she feels has further disrupted her sleep pattern. Prior to her hospitalization, she would wake every two hours to use the bathroom, which her pulmonologist suggested might be due to sleep apnea rather than a need to urinate.       Past Medical History:  Diagnosis Date   Anxiety    Aortic atherosclerosis (HCC) 08/19/2021   Arthritis    Asthma    Bipolar 1 disorder (HCC)    Chronic diastolic CHF (congestive heart failure) (HCC)    Class 3 severe obesity with serious comorbidity and body mass index (BMI) greater than or equal to 70 in adult [facility]) 12/21/2019   Depression    Diastolic dysfunction 08/02/2021   Diverticulosis 08/19/2021   Edema of both lower extremities    Hepatic steatosis 08/19/2021   Hepatomegaly 08/19/2021   High blood pressure    Hyperlipidemia    Joint pain    Morbid obesity (HCC)    Pre-diabetes     Past Surgical History:  Procedure Laterality Date    APPLICATION OF WOUND VAC Left 12/20/2021   Procedure: APPLICATION OF WOUND VAC;  Surgeon: Nadara Mustard, MD;  Location: [facility] OR;  Service: Orthopedics;  Laterality: Left;   I & D EXTREMITY Left 12/20/2021   Procedure: EXCISIONAL DEBRIDEMENT HEMATOMA LEFT LEG;  Surgeon: Nadara Mustard, MD;  Location: [facility] OR;  Service: Orthopedics;  Laterality: Left;   NO PAST SURGERIES     RIGHT/LEFT HEART CATH AND CORONARY ANGIOGRAPHY N/A 10/06/2021   Procedure: RIGHT/LEFT HEART CATH AND CORONARY ANGIOGRAPHY;  Surgeon: Corky Crafts, MD;  Location: [facility] INVASIVE CV LAB;  Service: Cardiovascular;  Laterality: N/A;    Family History  Problem Relation Age of Onset   Heart Problems Mother        Broken heart syndrome   Hypertension Mother    Hypercholesterolemia Mother    Bone cancer Father    Testicular cancer Father    Heart attack Sister    Diabetes Brother    Colon cancer Maternal Grandfather    Dementia Paternal Grandmother     Social History   Tobacco Use   Smoking status: Never    Passive exposure: Never   Smokeless tobacco: Never  Vaping Use   Vaping status: Never Used  Substance Use Topics   Alcohol use: Not Currently    Comment: Maybe 2 glasses of wine a month   Drug use: Never     Allergies  Allergen Reactions   Aspirin Nausea And Vomiting   Egg-Derived Products Swelling   Influenza Vaccines Swelling    Review of Systems NEGATIVE UNLESS OTHERWISE INDICATED IN HPI      Objective:     BP 138/86 (BP Location: Left Arm, Patient Position: Sitting, Cuff Size: Large)   Pulse 67   Temp (!) 97.5 F (36.4 C) (Temporal)   Ht 5' (1.524 m)   Wt (!) 392 lb 9.6 oz (178.1 kg)   SpO2 96%   BMI 76.67 kg/m   Wt Readings from Last 3 Encounters:  10/21/23 (!) 392 lb 9.6 oz (178.1 kg)  10/10/23 (!) 382 lb 3.2 oz (173.4 kg)  06/28/23 (!) 382 lb (173.3 kg)    BP Readings from Last 3 Encounters:  10/21/23 138/86  10/10/23 126/82  08/27/23 132/72     Physical  Exam Constitutional:      Appearance: Normal appearance. She is morbidly obese.     Comments: Using a walker  HENT:     Right Ear: There is impacted cerumen.     Left Ear: There is impacted cerumen.     Nose: No congestion.  Eyes:     Extraocular Movements: Extraocular movements intact.     Conjunctiva/sclera: Conjunctivae normal.     Pupils: Pupils are equal, round, and reactive to light.  Cardiovascular:     Rate and Rhythm: Normal rate and regular rhythm.     Pulses: Normal pulses.     Heart sounds: Normal heart sounds.  Pulmonary:     Effort: Pulmonary effort is normal.     Breath sounds: Normal breath sounds.  Neurological:     Mental Status: She is alert.  Psychiatric:        Mood and Affect: Mood normal.        Behavior: Behavior normal.           Time Spent: 43 minutes of total time was spent on the date of the encounter performing the following actions: chart review prior to seeing the patient, obtaining history, performing a medically necessary exam, counseling on the treatment plan, placing orders, and documenting in our EHR.       Jalene Lacko M Matalyn Nawaz, PA-C

## 2023-10-22 ENCOUNTER — Other Ambulatory Visit: Payer: Self-pay | Admitting: Physician Assistant

## 2023-10-22 ENCOUNTER — Telehealth: Payer: Self-pay

## 2023-10-22 ENCOUNTER — Ambulatory Visit: Payer: Medicaid Other

## 2023-10-22 DIAGNOSIS — R5381 Other malaise: Secondary | ICD-10-CM

## 2023-10-22 DIAGNOSIS — R269 Unspecified abnormalities of gait and mobility: Secondary | ICD-10-CM

## 2023-10-22 DIAGNOSIS — R928 Other abnormal and inconclusive findings on diagnostic imaging of breast: Secondary | ICD-10-CM

## 2023-10-22 DIAGNOSIS — M79605 Pain in left leg: Secondary | ICD-10-CM

## 2023-10-22 DIAGNOSIS — R2689 Other abnormalities of gait and mobility: Secondary | ICD-10-CM

## 2023-10-22 DIAGNOSIS — M6281 Muscle weakness (generalized): Secondary | ICD-10-CM

## 2023-10-22 DIAGNOSIS — R2681 Unsteadiness on feet: Secondary | ICD-10-CM

## 2023-10-22 DIAGNOSIS — Z7409 Other reduced mobility: Secondary | ICD-10-CM

## 2023-10-22 LAB — COMPREHENSIVE METABOLIC PANEL
ALT: 16 U/L (ref 0–35)
AST: 18 U/L (ref 0–37)
Albumin: 4.2 g/dL (ref 3.5–5.2)
Alkaline Phosphatase: 136 U/L — ABNORMAL HIGH (ref 39–117)
BUN: 15 mg/dL (ref 6–23)
CO2: 32 meq/L (ref 19–32)
Calcium: 9 mg/dL (ref 8.4–10.5)
Chloride: 99 meq/L (ref 96–112)
Creatinine, Ser: 0.72 mg/dL (ref 0.40–1.20)
GFR: 91.99 mL/min (ref >60.00)
Glucose, Bld: 90 mg/dL (ref 70–99)
Potassium: 4.1 meq/L (ref 3.5–5.1)
Sodium: 138 meq/L (ref 135–145)
Total Bilirubin: 0.4 mg/dL (ref 0.2–1.2)
Total Protein: 7.4 g/dL (ref 6.0–8.3)

## 2023-10-22 LAB — CBC WITH DIFFERENTIAL/PLATELET
Basophils Absolute: 0 10*3/uL (ref 0.0–0.1)
Basophils Relative: 0.6 % (ref 0.0–3.0)
Eosinophils Absolute: 0.2 10*3/uL (ref 0.0–0.7)
Eosinophils Relative: 2.6 % (ref 0.0–5.0)
HCT: 37.3 % (ref 36.0–46.0)
Hemoglobin: 12.3 g/dL (ref 12.0–15.0)
Lymphocytes Relative: 13.2 % (ref 12.0–46.0)
Lymphs Abs: 1 10*3/uL (ref 0.7–4.0)
MCHC: 32.9 g/dL (ref 30.0–36.0)
MCV: 92.2 fl (ref 78.0–100.0)
Monocytes Absolute: 0.4 10*3/uL (ref 0.1–1.0)
Monocytes Relative: 4.9 % (ref 3.0–12.0)
Neutro Abs: 5.8 10*3/uL (ref 1.4–7.7)
Neutrophils Relative %: 78.7 % — ABNORMAL HIGH (ref 43.0–77.0)
Platelets: 348 10*3/uL (ref 150.0–400.0)
RBC: 4.05 Mil/uL (ref 3.87–5.11)
RDW: 14.8 % (ref 11.5–15.5)
WBC: 7.4 10*3/uL (ref 4.0–10.5)

## 2023-10-22 LAB — LIPID PANEL
Cholesterol: 122 mg/dL (ref 0–200)
HDL: 55.5 mg/dL (ref >39.00)
LDL Cholesterol: 57 mg/dL (ref 0–99)
NonHDL: 66.17
Total CHOL/HDL Ratio: 2
Triglycerides: 46 mg/dL (ref 0.0–149.0)
VLDL: 9.2 mg/dL (ref 0.0–40.0)

## 2023-10-22 LAB — VITAMIN D 25 HYDROXY (VIT D DEFICIENCY, FRACTURES): VITD: 18.25 ng/mL — ABNORMAL LOW (ref 30.00–100.00)

## 2023-10-22 LAB — HEMOGLOBIN A1C: Hgb A1c MFr Bld: 5.9 % (ref 4.6–6.5)

## 2023-10-22 LAB — VITAMIN B12: Vitamin B-12: 567 pg/mL (ref 211–911)

## 2023-10-22 LAB — TSH: TSH: 1.34 u[IU]/mL (ref 0.35–5.50)

## 2023-10-22 NOTE — Progress Notes (Signed)
 Complex Care Management Note  Care Guide Note 10/22/2023 Name: Christina Dominguez MRN: [id number] DOB: Dec 01, 1965  Christina Dominguez is a 58 y.o. year old female who sees Allwardt, Crist Infante, PA-C for primary care. I reached out to Christina Dominguez by phone today to offer complex care management services.  [name] was given information about Complex Care Management services today including:   The Complex Care Management services include support from the care team which includes your Nurse Care Manager, Clinical Social Worker, or Pharmacist.  The Complex Care Management team is here to help remove barriers to the health concerns and goals most important to you. Complex Care Management services are voluntary, and the patient may decline or stop services at any time by request to their care team member.   Complex Care Management Consent Status: Patient wishes to consider information provided and/or speak with a member of the care team before deciding to participate in complex care management services.   Follow up plan:  Telephone appointment with complex care management team member scheduled for:  10/23/23 at 3:00 p.m.   Encounter Outcome:  Patient Scheduled  [facility]  [facility], [facility] Health Care Management Assistant Direct Dial: [phone]  Fax: [phone]

## 2023-10-23 ENCOUNTER — Ambulatory Visit: Payer: Self-pay

## 2023-10-23 ENCOUNTER — Telehealth (HOSPITAL_BASED_OUTPATIENT_CLINIC_OR_DEPARTMENT_OTHER): Payer: Self-pay | Admitting: Pulmonary Disease

## 2023-10-23 ENCOUNTER — Telehealth: Payer: Self-pay | Admitting: Physician Assistant

## 2023-10-23 NOTE — Telephone Encounter (Signed)
CMN signed and faxed

## 2023-10-23 NOTE — Telephone Encounter (Signed)
 Patient dropped off document  Personal care request form , to be filled out by provider. Patient requested to send it back via Fax within 5-days. Document is located in providers tray at front office.Please advise at Mobile [phone] (mobile)

## 2023-10-23 NOTE — Patient Instructions (Signed)
 Visit Information  Thank you for taking time to visit with me today. Please don't hesitate to contact me if I can be of assistance to you.   Following are the goals we discussed today:  Patient to contact Medicaid Transportation for upcoming appointments. Patient to await contact from [facility] for [facility] services.   Our next appointment is by telephone on 11/15/23 at 3pm  Please call the care guide team at [phone] if you need to cancel or reschedule your appointment.   If you are experiencing a Mental Health or Behavioral Health Crisis or need someone to talk to, please call 911  Patient verbalizes understanding of instructions and care plan provided today and agrees to view in MyChart. Active MyChart status and patient understanding of how to access instructions and care plan via MyChart confirmed with patient.     Telephone follow up appointment with care management team member scheduled for:11/15/23 at 3pm.   Lysle Morales, BSW   [facility], [facility] Social Worker Direct Dial: [phone]  Fax: [phone] Website: [email or web address]

## 2023-10-23 NOTE — Patient Outreach (Signed)
 Care Coordination   Initial Visit Note   10/23/2023 Name: Christina Dominguez MRN: [id number] DOB: September 02, 1965  Christina Dominguez is a 58 y.o. year old female who sees Allwardt, Crist Infante, PA-C for primary care. I spoke with  Christina Dominguez by phone today.  What matters to the patients health and wellness today?  Patient has not family in the area. There are times when she needs to see a specialist and has less than 3-5 days notice. Patient is not able to afford paying for [facility] services.  SW provides a direct number to [employer] for a shorter wait time.   Goals Addressed             This Visit's Progress    Care Coordination Activities       Interventions Today    Flowsheet Row Most Recent Value  Chronic Disease   Chronic disease during today's visit Congestive Heart Failure (CHF), Hypertension (HTN)  General Interventions   General Interventions Discussed/Reviewed General Interventions Discussed, General Interventions Reviewed, Community Resources  [Pt has access to [employer] but sometimes has specialist appts w/little notice.SW recommends pt pay for rides but she can't afford the cost.Pt will contact [phone] for request.PCS application completed for care in the home.]              SDOH assessments and interventions completed:  Yes  SDOH Interventions Today    Flowsheet Row Most Recent Value  SDOH Interventions   Food Insecurity Interventions Intervention Not Indicated  Housing Interventions Intervention Not Indicated  Transportation Interventions Other (Comment)  [Will contact Medicaid Transportation for visits]  Utilities Interventions Intervention Not Indicated        Care Coordination Interventions:  Yes, provided   Follow up plan: Follow up call scheduled for 11/15/23 at 3pm.    Encounter Outcome:  Patient Visit Completed

## 2023-10-24 ENCOUNTER — Telehealth: Payer: Self-pay

## 2023-10-24 ENCOUNTER — Ambulatory Visit: Payer: Medicaid Other | Admitting: Physical Therapy

## 2023-10-24 ENCOUNTER — Encounter: Payer: Self-pay | Admitting: Physician Assistant

## 2023-10-24 ENCOUNTER — Encounter: Payer: Self-pay | Admitting: Physical Therapy

## 2023-10-24 DIAGNOSIS — M6281 Muscle weakness (generalized): Secondary | ICD-10-CM

## 2023-10-24 DIAGNOSIS — R5381 Other malaise: Secondary | ICD-10-CM

## 2023-10-24 DIAGNOSIS — Z7409 Other reduced mobility: Secondary | ICD-10-CM

## 2023-10-24 DIAGNOSIS — R2689 Other abnormalities of gait and mobility: Secondary | ICD-10-CM

## 2023-10-24 NOTE — Telephone Encounter (Signed)
 Forms placed in provider folder for review to be completed

## 2023-10-24 NOTE — Therapy (Signed)
 OUTPATIENT PHYSICAL THERAPY LOWER EXTREMITY TREATMENT   Patient Name: Christina Dominguez MRN: [id number] DOB:01/10/1966, 58 y.o., female Today's Date: 10/24/2023  END OF SESSION:  PT End of Session - 10/24/23 1520     Visit Number 9    Date for PT Re-Evaluation 12/03/23    PT Start Time 1525   pt in bathroom   PT Stop Time 1557    PT Time Calculation (min) 32 min    Activity Tolerance Patient tolerated treatment well    Behavior During Therapy [facility] for tasks assessed/performed                   Past Medical History:  Diagnosis Date   Anxiety    Aortic atherosclerosis (HCC) 08/19/2021   Arthritis    Asthma    Bipolar 1 disorder (HCC)    Chronic diastolic CHF (congestive heart failure) (HCC)    Class 3 severe obesity with serious comorbidity and body mass index (BMI) greater than or equal to 70 in adult (HCC) 12/21/2019   Depression    Diastolic dysfunction 08/02/2021   Diverticulosis 08/19/2021   Edema of both lower extremities    Hepatic steatosis 08/19/2021   Hepatomegaly 08/19/2021   High blood pressure    Hyperlipidemia    Joint pain    Morbid obesity (HCC)    Pre-diabetes    Past Surgical History:  Procedure Laterality Date   APPLICATION OF WOUND VAC Left 12/20/2021   Procedure: APPLICATION OF WOUND VAC;  Surgeon: Nadara Mustard, MD;  Location: [facility] OR;  Service: Orthopedics;  Laterality: Left;   I & D EXTREMITY Left 12/20/2021   Procedure: EXCISIONAL DEBRIDEMENT HEMATOMA LEFT LEG;  Surgeon: Nadara Mustard, MD;  Location[facility] OR;  Service: Orthopedics;  Laterality: Left;   NO PAST SURGERIES     RIGHT/LEFT HEART CATH AND CORONARY ANGIOGRAPHY N/A 10/06/2021   Procedure: RIGHT/LEFT HEART CATH AND CORONARY ANGIOGRAPHY;  Surgeon: Corky Crafts, MD;  Location: [facility] INVASIVE CV LAB;  Service: Cardiovascular;  Laterality: N/A;   Patient Active Problem List   Diagnosis Date Noted   Hyperlipidemia 08/30/2023   Class 3 obesity 07/30/2023   Impaired mobility and  ADLs 06/30/2023   Lumbar radiculopathy 06/30/2023   Lymphedema 06/30/2023   Left leg cellulitis 06/20/2023   Cellulitis 06/19/2023   Cellulitis of lower extremity 06/18/2023   Transaminitis 06/06/2023   Chest pain 06/05/2023   Cellulitis of left lower extremity 06/04/2023   Nocturnal hypoxemia 07/03/2022   Arthritis 04/16/2022   Asthma 04/16/2022   Hematoma of left lower leg    Wheezing 11/23/2021   Snoring 11/23/2021   Other secondary pulmonary hypertension (HCC) 11/23/2021   Hyponatremia 10/11/2021   Dyslipidemia 10/04/2021   Mediastinal mass 10/04/2021   Obstructive sleep apnea 08/25/2021   RUQ pain 08/19/2021   Hepatic steatosis 08/19/2021   Hepatomegaly 08/19/2021   Aortic atherosclerosis (HCC) 08/19/2021   Diverticulosis 08/19/2021   Anxiety 08/02/2021   Bipolar 1 disorder (HCC) 08/02/2021   Chronic diastolic CHF (congestive heart failure) (HCC) 08/02/2021   Depression 08/02/2021   Joint pain 08/02/2021   Screening for malignant neoplasm of colon 08/02/2021   Diastolic dysfunction 08/02/2021   Heart failure, unspecified (HCC) 08/02/2021   Prediabetes 12/22/2019   Vitamin D insufficiency 12/22/2019   Class 3 severe obesity with serious comorbidity and body mass index (BMI) greater than or equal to 60 in adult [facility]) 12/21/2019   Essential hypertension 04/30/2019   Mild intermittent asthma without complication 04/30/2019  PCP: Dr. Ila Mcgill  REFERRING PROVIDER: Dr. Joen Laura  REFERRING DIAG: Weakness  THERAPY DIAG:  Muscle weakness (generalized)  Physical deconditioning  Other abnormalities of gait and mobility  Impaired functional mobility and endurance  Rationale for Evaluation and Treatment: Rehabilitation  ONSET DATE: 06/08/23 - referral date  SUBJECTIVE:   SUBJECTIVE STATEMENT:  Larey Seat the other day off the side of the bed (trying to get into it), just had some bruises from it but no broken bones or anything like this. Not much new  otherwise. Added walking outside to my routine, trying to hit goal of 2500 steps a day, trying to also exercise at least 30 minutes a day on my own outside of PT.    PERTINENT HISTORY: Aortic atherosclerosis Bipolar CHF Severe Obesity Bilateral LE Edema Hypertension Hyperlipidemia Prediabetes Asthma arthritis PSH: heart cath, L leg excisional debridement hematoma PAIN:  Are you having pain? Yes: NPRS scale: 3/10  Pain location: Knees R ankle  Pain description: aching  Aggravating factors: prolonged standing Relieving factors: [place] Described feeling pain in knees and ankles generally  PRECAUTIONS: Fall  RED FLAGS: None   WEIGHT BEARING RESTRICTIONS: No  FALLS:  Has patient fallen in last 6 months? 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  DIAGNOSTIC FINDINGS: none recent   COGNITION: Overall cognitive status: Within functional limits for tasks assessed     SENSATION: WFL  POSTURE: rounded shoulders, increased lumbar lordosis, anterior pelvic tilt, and flexed trunk   LOWER EXTREMITY ROM: resting pain 4/10, 8/10 with movements  Active ROM Right eval Left eval  Hip flexion [facility]  Hip extension    Hip abduction    Hip adduction    Hip internal rotation    Hip external rotation    Knee flexion [facility]  Knee extension [facility]  Ankle dorsiflexion [facility]  Ankle plantarflexion [facility]  Ankle inversion    Ankle eversion     (Blank rows = not tested)  LOWER EXTREMITY MMT:  MMT Right eval Left eval Right 10/08/23  Left 10/08/23  Hip flexion 4-* 3+* 4 4  Hip extension      Hip abduction 4* 4* 4+ 4+  Hip adduction 4* 4*    Hip internal rotation      Hip external rotation      Knee flexion   4 4-  Knee extension 3+* 3+* 4 4  Ankle dorsiflexion 5 5    Ankle plantarflexion      Ankle inversion      Ankle eversion       (Blank rows = not tested)  FUNCTIONAL TESTS:  30 seconds chair stand test Timed up and go (TUG): 26.37 30 second STS: 4    GAIT: Distance walked: in clinic distances Assistive device utilized: Walker - 2 wheeled Level of assistance: Modified independence  TREATMENT DATE:   10/24/23  Nustep L5x6 minutes all four extremities Fitter 2 blue bands 1x15 B  LAQs 2.5# B x10 Seated marches 2.5# B x10 (alternating) Walking laps with 2.5# each LE and RW 149ft    10/22/23 NuStep L5x58mins  Fitter pushes 2x10 1 blue and 1 black band  Seated marches 2# 2x10 Seated 4" box taps laterally 2# 2x10 Seated horizontal abd red 2x10 Chest press yellow ball 2x10  OHP yellow ball 2x10   10/15/23 Walking laps with 2# weights  Marching 2# 2x10 LAQ 2# 2x10 Seated calf raises 2x12 Seated toe raises 2x12 Seated rows red 2x10 HS curls red 2x10 Mini squats x10   10/08/23  MMT update (see above)  Nustep L4x6 minutes (breaks every 2 minutes) Gait x3 laps with RW (untimed, no breaks) 2 lap walk walk with 1# each leg   LAQ 1# x10 B alternating Chest press yellow ball x12 Seated marches 1# x10 B alternating  OH press 2# each UE x10 B alternating   Sitting UE ABD 2# each UE x12        PATIENT EDUCATION:  Education details: POC and HEP Person educated: Patient Education method: Medical illustrator Education comprehension: verbalized understanding and returned demonstration  HOME EXERCISE PROGRAM: Access Code: [email or web address] URL: [email or web address] Date:  09/10/2023 Prepared by: Cassie Freer Exercises  - Seated March  - 1 x daily - 7 x weekly - 2 sets - 10 reps  - Seated Long Arc Quad  - 1 x daily - 7 x weekly - 2 sets - 10 reps  - Seated Hip Adduction Isometrics with Ball  - 1 x daily - 7 x weekly - 2 sets - 10 reps  - Seated Heel Raise  - 1 x daily - 7 x weekly - 2 sets - 10 reps   ASSESSMENT:  CLINICAL IMPRESSION:  Continued working on functional strengthening and activity tolerance as able today. Happy to hear that she didn't have any major injuries since her fall. Will continue to progress challenges as able/tolerated.    OBJECTIVE IMPAIRMENTS: Abnormal gait, cardiopulmonary status limiting activity, decreased activity tolerance, decreased balance, decreased endurance, difficulty walking, decreased strength, improper body mechanics, postural dysfunction, obesity, and pain.   ACTIVITY LIMITATIONS: carrying, lifting, bending, sitting, standing, squatting, and stairs  PARTICIPATION LIMITATIONS: meal prep, cleaning, laundry, driving, shopping, community activity, occupation, and yard work  PERSONAL FACTORS: Age, Behavior pattern, Education, Fitness, and Time since onset of injury/illness/exacerbation are also affecting patient's functional outcome.   REHAB POTENTIAL: Good  CLINICAL DECISION MAKING: Stable/uncomplicated  EVALUATION COMPLEXITY: Low   GOALS: Goals reviewed with patient? Yes  SHORT TERM GOALS: Target date: 10/22/23  Patient will be independent with initial HEP. Baseline: given 09/10/23 Goal status: INITIAL   LONG TERM GOALS: Target date:12/03/23  Patient will be independent with advanced/ongoing HEP to improve outcomes and carryover.  Baseline:  Goal status: INITIAL  2.  Patient will report at least 75% improvement in bilateral LE pain to improve QOL. Baseline: 4/10 resting, 8/10 with movement (09/10/23) Goal status: INITIAL  3.  Patient will increase standing tolerance to 10 mins or longer to be able to complete  household chores. Baseline: unable to do Goal status: INITIAL  4.  Patient will be able to ambulate 200' with LRAD and normal gait pattern without increased pain to access community.  Baseline: TBD Goal status: INITIAL  5.  Patient will perform 7 STS in 30 seconds Baseline: 4 (09/10/23) Goal status: INITIAL   PLAN:  PT FREQUENCY: 2x/week  PT DURATION: 12 weeks  PLANNED INTERVENTIONS: 97110-Therapeutic exercises, 97530- Therapeutic activity, [id number]- Neuromuscular re-education, 97535- Self Care, [id number]- Manual therapy, [phone]- Gait training, [phone]- Aquatic Therapy, Stair training, DME instructions, Cryotherapy, and Moist heat  PLAN FOR NEXT SESSION: bilateral LE strengthening, improve muscular and cardiovascular endurance, progressions as tolerated   Nedra Hai, PT, DPT 10/24/23 3:57 PM

## 2023-10-24 NOTE — Telephone Encounter (Signed)
 Forms completed and faxed back to [facility]; received fax confirmation

## 2023-10-24 NOTE — Telephone Encounter (Signed)
 Noted and agreed, thank you.

## 2023-10-24 NOTE — Telephone Encounter (Signed)
 Copied from CRM [id number]. Topic: General - Other >> Oct 23, 2023  3:43 PM Almira Coaster wrote: Reason for CRM: Patient is calling to follow up with [facility] regarding  transfer orders from [facility].  Called pt to advise orders were faxed this week after her OV; pt was told orders need to be more specific. Advised patient would refax to [facility] with office notes. Faxed again to [employer] and received fax confirmation. Nothing further needed at this time.

## 2023-10-25 ENCOUNTER — Other Ambulatory Visit: Payer: Self-pay | Admitting: *Deleted

## 2023-10-25 DIAGNOSIS — R2681 Unsteadiness on feet: Secondary | ICD-10-CM | POA: Diagnosis not present

## 2023-10-25 DIAGNOSIS — G4733 Obstructive sleep apnea (adult) (pediatric): Secondary | ICD-10-CM | POA: Diagnosis not present

## 2023-10-25 DIAGNOSIS — I5022 Chronic systolic (congestive) heart failure: Secondary | ICD-10-CM | POA: Diagnosis not present

## 2023-10-25 MED ORDER — VITAMIN D (ERGOCALCIFEROL) 1.25 MG (50000 UNIT) PO CAPS: 50000.0000 [IU] | ORAL_CAPSULE | ORAL | 0 refills | Status: AC

## 2023-10-27 IMAGING — DX DG CHEST 2V
2 series · 2 of 2 positions shown · non-contrast
Comparison: None.

CLINICAL DATA: Shortness of breath.

EXAM:
CHEST - 2 VIEW

[chest pa]
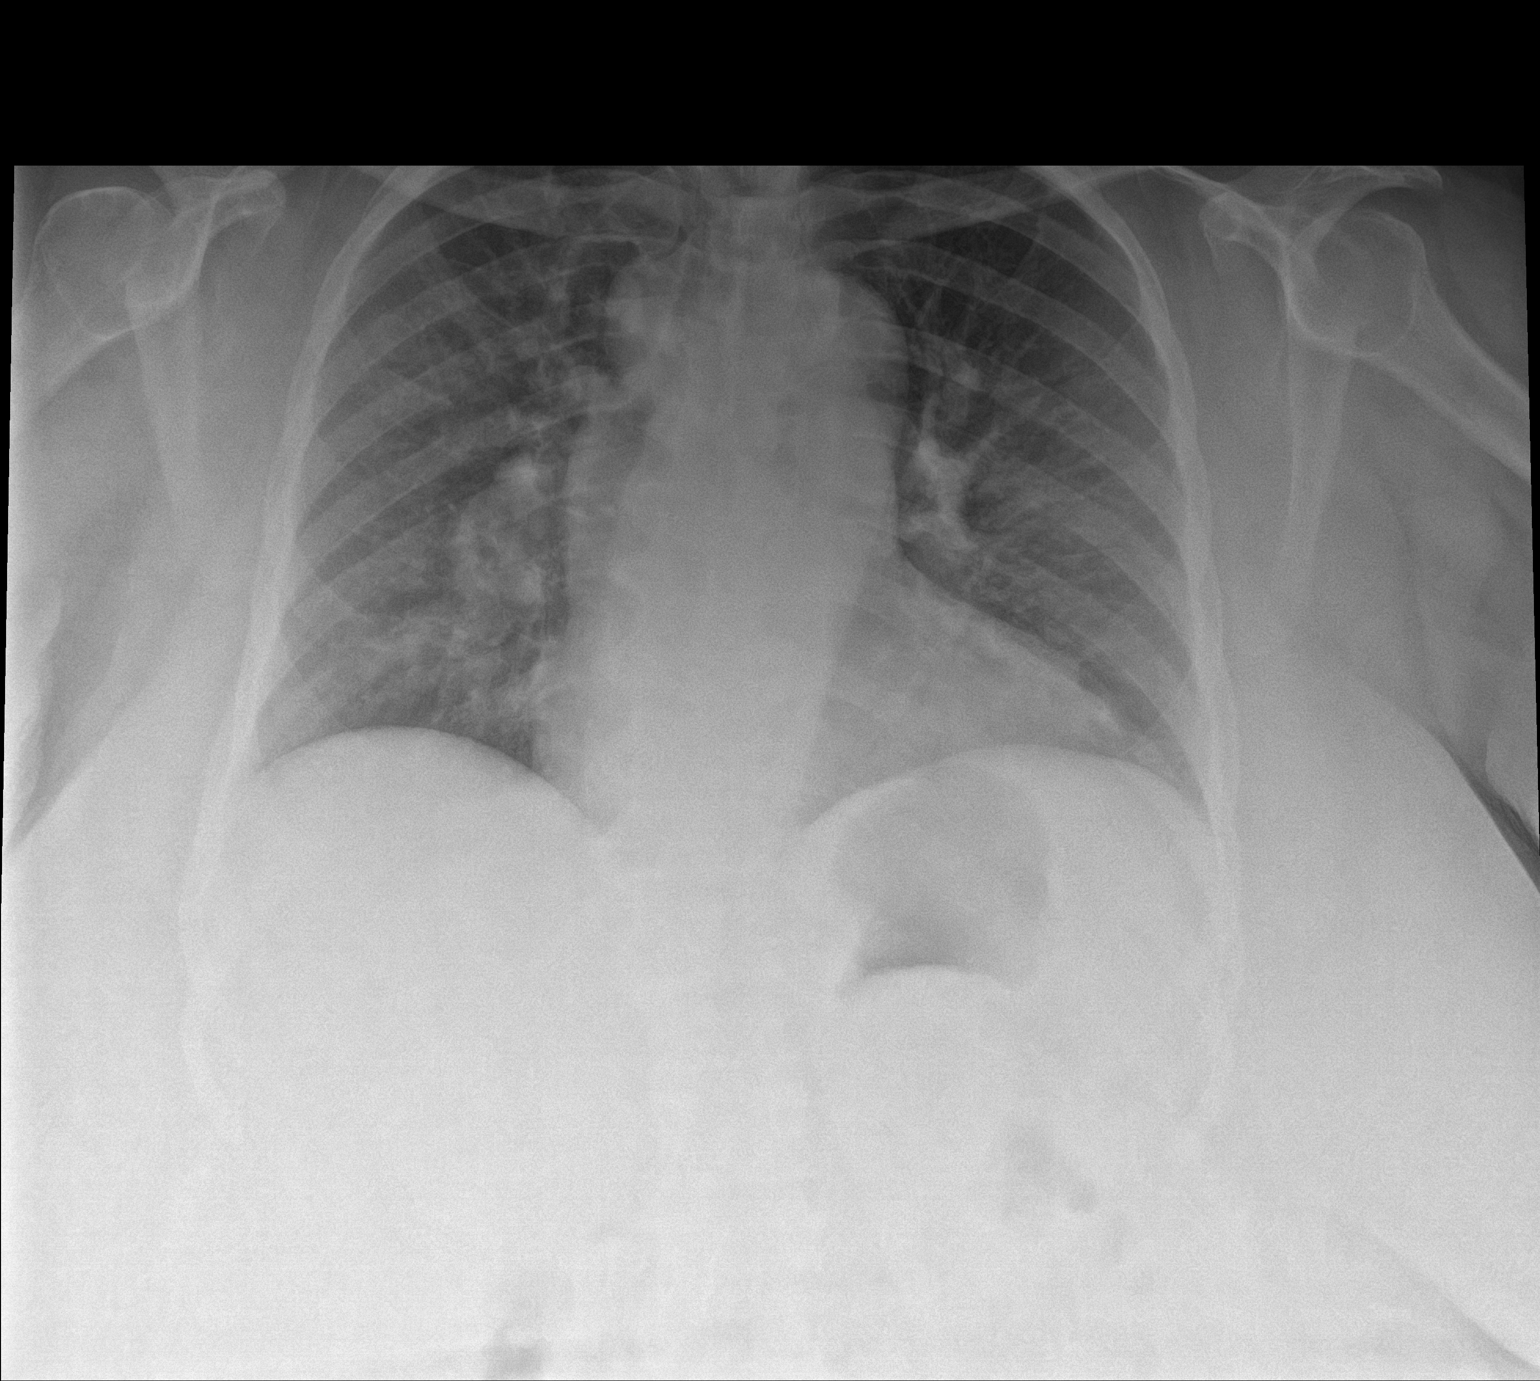

[chest lat]
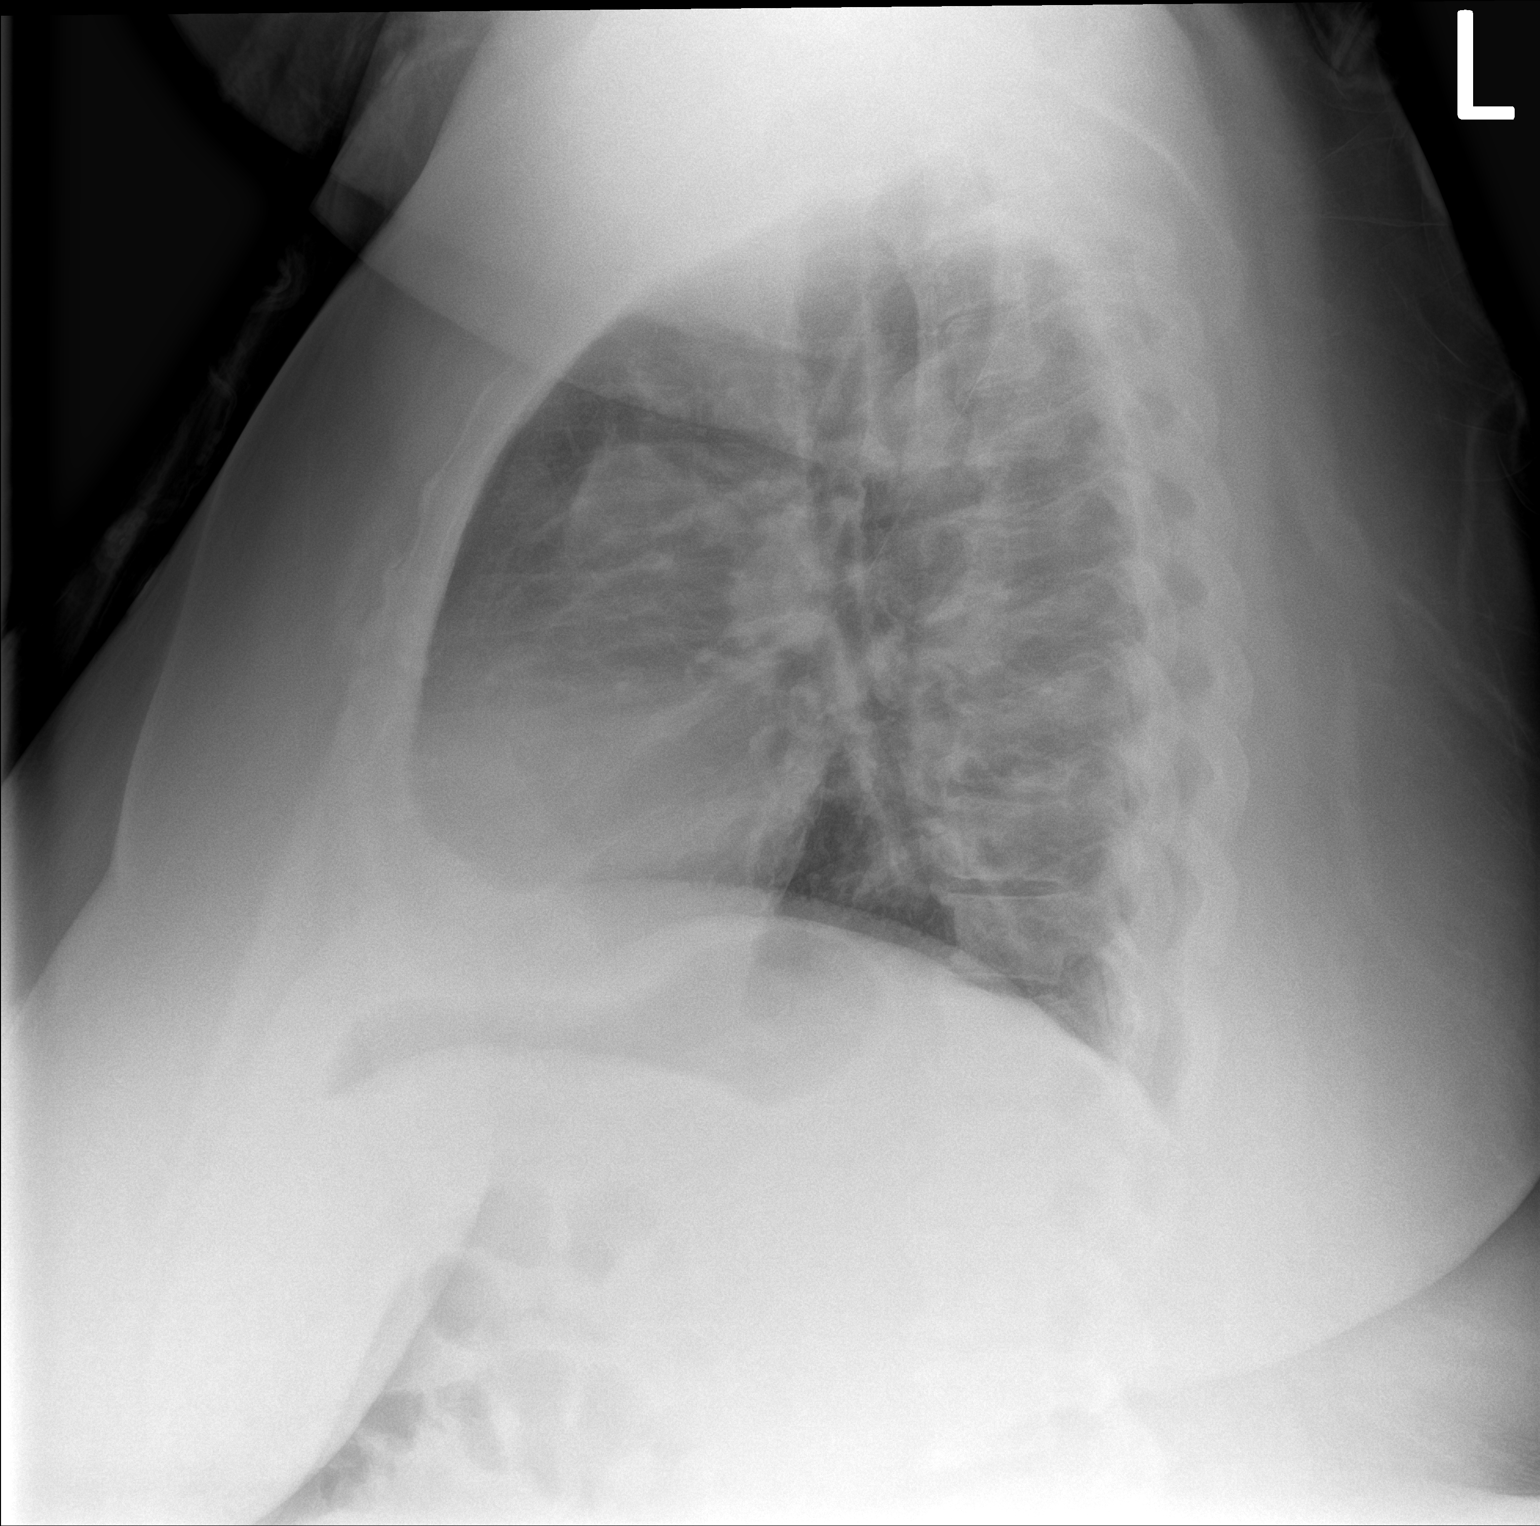

[2 of 2 positions shown; findings below may reference images not displayed]

FINDINGS: There is vascular congestion. No focal consolidation, pleural
effusion or pneumothorax. No acute osseous pathology. Degenerative
changes of the spine
IMPRESSION: Vascular congestion. No focal consolidation.

## 2023-10-29 ENCOUNTER — Ambulatory Visit: Payer: Medicaid Other

## 2023-10-29 DIAGNOSIS — Z6841 Body Mass Index (BMI) 40.0 and over, adult: Secondary | ICD-10-CM | POA: Diagnosis not present

## 2023-10-29 DIAGNOSIS — I509 Heart failure, unspecified: Secondary | ICD-10-CM | POA: Diagnosis not present

## 2023-10-29 DIAGNOSIS — I5032 Chronic diastolic (congestive) heart failure: Secondary | ICD-10-CM | POA: Diagnosis not present

## 2023-10-29 DIAGNOSIS — I700 Atherosclerosis of aorta: Secondary | ICD-10-CM | POA: Diagnosis not present

## 2023-10-29 DIAGNOSIS — Z9189 Other specified personal risk factors, not elsewhere classified: Secondary | ICD-10-CM | POA: Diagnosis not present

## 2023-10-30 DIAGNOSIS — Z713 Dietary counseling and surveillance: Secondary | ICD-10-CM | POA: Diagnosis not present

## 2023-10-30 NOTE — Therapy (Signed)
 OUTPATIENT PHYSICAL THERAPY LOWER EXTREMITY TREATMENT  Progress Note Reporting Period 09/10/23 to 10/31/23  See note below for Objective Data and Assessment of Progress/Goals.     Patient Name: Christina Dominguez MRN: [id number] DOB:July 21, 1966, 58 y.o., female Today's Date: 10/31/2023  END OF SESSION:  PT End of Session - 10/31/23 1543     Visit Number 10    Date for PT Re-Evaluation 12/03/23    PT Start Time 1545    PT Stop Time 1630    PT Time Calculation (min) 45 min    Activity Tolerance Patient tolerated treatment well    Behavior During Therapy [facility] for tasks assessed/performed                    Past Medical History:  Diagnosis Date   Anxiety    Aortic atherosclerosis (HCC) 08/19/2021   Arthritis    Asthma    Bipolar 1 disorder (HCC)    Chronic diastolic CHF (congestive heart failure) (HCC)    Class 3 severe obesity with serious comorbidity and body mass index (BMI) greater than or equal to 70 in adult (HCC) 12/21/2019   Depression    Diastolic dysfunction 08/02/2021   Diverticulosis 08/19/2021   Edema of both lower extremities    Hepatic steatosis 08/19/2021   Hepatomegaly 08/19/2021   High blood pressure    Hyperlipidemia    Joint pain    Morbid obesity (HCC)    Pre-diabetes    Past Surgical History:  Procedure Laterality Date   APPLICATION OF WOUND VAC Left 12/20/2021   Procedure: APPLICATION OF WOUND VAC;  Surgeon: Nadara Mustard, MD;  Location: [facility] OR;  Service: Orthopedics;  Laterality: Left;   I & D EXTREMITY Left 12/20/2021   Procedure: EXCISIONAL DEBRIDEMENT HEMATOMA LEFT LEG;  Surgeon: Nadara Mustard, MD;  Location: [facility] OR;  Service: Orthopedics;  Laterality: Left;   NO PAST SURGERIES     RIGHT/LEFT HEART CATH AND CORONARY ANGIOGRAPHY N/A 10/06/2021   Procedure: RIGHT/LEFT HEART CATH AND CORONARY ANGIOGRAPHY;  Surgeon: Corky Crafts, MD;  Location: [facility] INVASIVE CV LAB;  Service: Cardiovascular;  Laterality: N/A;   Patient Active Problem  List   Diagnosis Date Noted   Hyperlipidemia 08/30/2023   Class 3 obesity 07/30/2023   Impaired mobility and ADLs 06/30/2023   Lumbar radiculopathy 06/30/2023   Lymphedema 06/30/2023   Left leg cellulitis 06/20/2023   Cellulitis 06/19/2023   Cellulitis of lower extremity 06/18/2023   Transaminitis 06/06/2023   Chest pain 06/05/2023   Cellulitis of left lower extremity 06/04/2023   Nocturnal hypoxemia 07/03/2022   Arthritis 04/16/2022   Asthma 04/16/2022   Hematoma of left lower leg    Wheezing 11/23/2021   Snoring 11/23/2021   Other secondary pulmonary hypertension (HCC) 11/23/2021   Hyponatremia 10/11/2021   Dyslipidemia 10/04/2021   Mediastinal mass 10/04/2021   Obstructive sleep apnea 08/25/2021   RUQ pain 08/19/2021   Hepatic steatosis 08/19/2021   Hepatomegaly 08/19/2021   Aortic atherosclerosis (HCC) 08/19/2021   Diverticulosis 08/19/2021   Anxiety 08/02/2021   Bipolar 1 disorder (HCC) 08/02/2021   Chronic diastolic CHF (congestive heart failure) (HCC) 08/02/2021   Depression 08/02/2021   Joint pain 08/02/2021   Screening for malignant neoplasm of colon 08/02/2021   Diastolic dysfunction 08/02/2021   Heart failure, unspecified (HCC) 08/02/2021   Prediabetes 12/22/2019   Vitamin D insufficiency 12/22/2019   Class 3 severe obesity with serious comorbidity and body mass index (BMI) greater than or equal to 60  in adult [facility]) 12/21/2019   Essential hypertension 04/30/2019   Mild intermittent asthma without complication 04/30/2019    PCP: Dr. Ila Mcgill  REFERRING PROVIDER: Dr. Joen Laura  REFERRING DIAG: Weakness  THERAPY DIAG:  Muscle weakness (generalized)  Physical deconditioning  Other abnormalities of gait and mobility  Impaired functional mobility and endurance  Rationale for Evaluation and Treatment: Rehabilitation  ONSET DATE: 06/08/23 - referral date  SUBJECTIVE:   SUBJECTIVE STATEMENT:  Having some SOB, want to take it easy.     PERTINENT HISTORY: Aortic atherosclerosis Bipolar CHF Severe Obesity Bilateral LE Edema Hypertension Hyperlipidemia Prediabetes Asthma arthritis PSH: heart cath, L leg excisional debridement hematoma PAIN:  Are you having pain? Yes: NPRS scale: 3/10  Pain location: Knees R ankle  Pain description: aching  Aggravating factors: prolonged standing Relieving factors: [place] Described feeling pain in knees and ankles generally  PRECAUTIONS: Fall  RED FLAGS: None   WEIGHT BEARING RESTRICTIONS: No  FALLS:  Has patient fallen in last 6 months? 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  DIAGNOSTIC FINDINGS: none recent   COGNITION: Overall cognitive status: Within functional limits for tasks assessed     SENSATION: WFL  POSTURE: rounded shoulders, increased lumbar lordosis, anterior pelvic tilt, and flexed trunk   LOWER EXTREMITY ROM: resting pain 4/10, 8/10 with movements  Active ROM Right eval Left eval  Hip flexion [facility]  Hip extension    Hip abduction    Hip adduction    Hip internal rotation    Hip external rotation    Knee flexion [facility]  Knee extension [facility]  Ankle dorsiflexion [facility]  Ankle plantarflexion [facility]  Ankle inversion    Ankle eversion     (Blank rows = not tested)  LOWER EXTREMITY MMT:  MMT Right eval Left eval Right 10/08/23 Left 10/08/23  Hip flexion 4-* 3+* 4 4  Hip extension      Hip abduction 4* 4* 4+ 4+  Hip adduction 4* 4*    Hip internal rotation      Hip external rotation       Knee flexion   4 4-  Knee extension 3+* 3+* 4 4  Ankle dorsiflexion 5 5    Ankle plantarflexion      Ankle inversion      Ankle eversion       (Blank rows = not tested)  FUNCTIONAL TESTS:  30 seconds chair stand test Timed up and go (TUG): 26.37 30 second STS: 4    GAIT: Distance walked: in clinic distances Assistive device utilized: Walker - 2 wheeled Level of assistance: Modified independence  TREATMENT DATE:  10/31/23 Recheck goals  LAQ 3# 2x10 with 3s hold Standing marches 3# with walker  Seated heel to toe raises 3# 2x12 Seated chest press to shoulder flexion 3# 2x10   10/24/23  Nustep L5x6 minutes all four extremities Fitter 2 blue bands 1x15 B  LAQs 2.5# B x10 Seated marches 2.5# B x10 (alternating) Walking laps with 2.5# each LE and RW 159ft    10/22/23 NuStep L5x6mins  Fitter pushes 2x10 1 blue and 1 black band  Seated marches 2# 2x10 Seated 4" box taps laterally 2# 2x10 Seated horizontal abd red 2x10 Chest press yellow ball 2x10  OHP yellow ball 2x10   10/15/23 Walking laps with 2# weights  Marching 2# 2x10 LAQ 2# 2x10 Seated calf raises 2x12 Seated toe raises 2x12 Seated rows red 2x10 HS curls red 2x10 Mini squats x10   10/08/23  MMT update (see above)  Nustep L4x6 minutes (breaks every 2 minutes) Gait x3 laps with RW (untimed, no breaks) 2 lap walk walk with 1# each leg   LAQ 1# x10 B alternating Chest press yellow ball x12 Seated marches 1# x10 B alternating  OH press 2# each UE x10 B alternating   Sitting UE ABD 2# each UE x12        PATIENT EDUCATION:  Education details: POC and HEP Person educated: Patient Education method: Medical illustrator Education comprehension: verbalized understanding and returned demonstration  HOME EXERCISE PROGRAM: Access Code: [email or web address] URL: [email or web address] Date:  09/10/2023 Prepared by: Cassie Freer Exercises  - Seated March  - 1 x daily - 7 x weekly - 2 sets - 10 reps  - Seated Long Arc Quad  - 1 x daily - 7 x weekly - 2 sets - 10 reps  - Seated Hip Adduction Isometrics with Ball  - 1 x daily - 7 x weekly - 2 sets - 10 reps  - Seated Heel Raise  - 1 x daily - 7 x weekly - 2 sets - 10 reps   ASSESSMENT:  CLINICAL IMPRESSION: With 10th visit progress note, she has made some progression towards goals. Today she is SOB and requests we take it easy. Continued working on functional strengthening and activity tolerance as able today. Will continue to progress challenges as able/tolerated.    OBJECTIVE IMPAIRMENTS: Abnormal gait, cardiopulmonary status limiting activity, decreased activity tolerance, decreased balance, decreased endurance, difficulty walking, decreased strength, improper body mechanics, postural dysfunction, obesity, and pain.   ACTIVITY LIMITATIONS: carrying, lifting, bending, sitting, standing, squatting, and stairs  PARTICIPATION LIMITATIONS: meal prep, cleaning, laundry, driving, shopping, community activity, occupation, and yard work  PERSONAL FACTORS: Age, Behavior pattern, Education, Fitness, and Time since onset of injury/illness/exacerbation are also affecting patient's functional outcome.   REHAB POTENTIAL: Good  CLINICAL DECISION MAKING: Stable/uncomplicated  EVALUATION COMPLEXITY: Low   GOALS: Goals reviewed with patient? Yes  SHORT TERM GOALS: Target date: 10/22/23  Patient will be independent with initial HEP. Baseline: given 09/10/23 Goal status: MET 10/31/23   LONG TERM GOALS: Target date:12/03/23  Patient will be independent with advanced/ongoing HEP to improve outcomes and carryover.  Baseline:  Goal status: INITIAL  2.  Patient will report at least 75% improvement in bilateral LE pain to improve QOL. Baseline: 4/10 resting, 8/10 with movement (09/10/23) Goal status: IN PROGRESS maybe 50% 10/31/23  3.  Patient  will increase standing tolerance to 10 mins or longer to be able to complete household chores. Baseline: unable to do Goal status: MET 10/31/23  4.  Patient will be able to ambulate 200' with LRAD and normal gait pattern without increased pain to access community.  Baseline: 3 laps with walker 10/08/23 Goal status: REVISED 473ft   5.  Patient will perform 7 STS in 30 seconds Baseline: 4 (09/10/23) Goal status: IN PROGRESS 5 reps 10/31/23   PLAN:  PT FREQUENCY: 2x/week  PT DURATION: 12 weeks  PLANNED INTERVENTIONS: 97110-Therapeutic exercises, 97530- Therapeutic activity, 97112- Neuromuscular re-education, 97535- Self Care, 82956- Manual therapy, [id number]- Gait training, [phone]- Aquatic Therapy, Stair training, DME instructions, Cryotherapy, and Moist heat  PLAN FOR NEXT SESSION: bilateral LE strengthening, improve muscular and cardiovascular endurance, progressions as tolerated   Cassie Freer PT, DPT 10/31/23 4:30 PM

## 2023-10-31 ENCOUNTER — Ambulatory Visit: Payer: Medicaid Other

## 2023-10-31 DIAGNOSIS — M6281 Muscle weakness (generalized): Secondary | ICD-10-CM

## 2023-10-31 DIAGNOSIS — Z7409 Other reduced mobility: Secondary | ICD-10-CM

## 2023-10-31 DIAGNOSIS — R2689 Other abnormalities of gait and mobility: Secondary | ICD-10-CM

## 2023-10-31 DIAGNOSIS — R5381 Other malaise: Secondary | ICD-10-CM

## 2023-11-01 ENCOUNTER — Other Ambulatory Visit: Payer: Self-pay | Admitting: Physician Assistant

## 2023-11-01 DIAGNOSIS — G4733 Obstructive sleep apnea (adult) (pediatric): Secondary | ICD-10-CM

## 2023-11-01 DIAGNOSIS — G479 Sleep disorder, unspecified: Secondary | ICD-10-CM

## 2023-11-01 DIAGNOSIS — R2681 Unsteadiness on feet: Secondary | ICD-10-CM

## 2023-11-01 DIAGNOSIS — E785 Hyperlipidemia, unspecified: Secondary | ICD-10-CM

## 2023-11-01 DIAGNOSIS — R5383 Other fatigue: Secondary | ICD-10-CM

## 2023-11-01 DIAGNOSIS — H6123 Impacted cerumen, bilateral: Secondary | ICD-10-CM

## 2023-11-01 DIAGNOSIS — E559 Vitamin D deficiency, unspecified: Secondary | ICD-10-CM

## 2023-11-01 DIAGNOSIS — R2689 Other abnormalities of gait and mobility: Secondary | ICD-10-CM

## 2023-11-01 DIAGNOSIS — N6310 Unspecified lump in the right breast, unspecified quadrant: Secondary | ICD-10-CM

## 2023-11-04 ENCOUNTER — Other Ambulatory Visit: Payer: Self-pay | Admitting: *Deleted

## 2023-11-04 NOTE — Patient Instructions (Signed)
 Visit Information  Ms. Hechavarria was given information about Medicaid Managed Care team care coordination services as a part of their [facility] Community Plan Medicaid benefit. [name] verbally consented to engagement with the [facility] Managed Care team.   If you are experiencing a medical emergency, please call 911 or report to your local emergency department or urgent care.   If you have a non-emergency medical problem during routine business hours, please contact your provider's office and ask to speak with a nurse.   For questions related to your [facility], please call: [phone] or visit the homepage here: [email or web address]  If you would like to schedule transportation through your [facility] Department, please call the following number at least 2 days in advance of your appointment: [phone]   Rides for urgent appointments can also be made after hours by calling Member Services.  Call the Behavioral Health Crisis Line at [phone], at any time, 24 hours a day, 7 days a week. If you are in danger or need immediate medical attention call 911.  If you would like help to quit smoking, call 1-800-QUIT-NOW ((864)369-6600) OR Espaol: 1-855-Djelo-Ya ([phone]) o para ms informacin haga clic aqu or Text READY to 536-644 to register via text  Ms. Becka,   Please see education materials related to CPAP and heart healthy diet provided by MyChart link.  Patient verbalizes understanding of instructions and care plan provided today and agrees to view in MyChart. Active MyChart status and patient understanding of how to access instructions and care plan via MyChart confirmed with patient.     Telephone follow up appointment with Managed Medicaid care management team member scheduled for:12/04/23 at 2pm  Estanislado Emms RN, BSN [place]  Value-Based Care  [facility] RN Care Manager [phone]   Following is a copy of your plan of care:  Care Plan : RN Care Manager Plan of Care  Updates made by Heidi Dach, RN since 11/04/2023 12:00 AM     Problem: Health Management needs related to CHF      Long-Range Goal: Development of Plan of Care to address Health Management needs related to CHF   Start Date: 07/15/2023  Expected End Date: 12/04/2023  Note:   Current Barriers:  Chronic Disease Management support and education needs related to CHF   RNCM Clinical Goal(s):  Patient will verbalize understanding of plan for management of CHF as evidenced by patient reports take all medications exactly as prescribed and will call provider for medication related questions as evidenced by patient reports attend all scheduled medical appointments: PT/OT twice weekly visits, Mammogram 11/06/23, PCP on 11/14/23, BSW on 11/15/23 and Pulmonology on 11/25/23 as evidenced by provider documentation continue to work with RN Care Manager to address care management and care coordination needs related to  CHF as evidenced by adherence to CM Team Scheduled appointments work with social worker to address  related to the management of Level of care concerns related to the management of CHF as evidenced by review of EMR and patient or Child psychotherapist report through collaboration with Medical illustrator, provider, and care team.   Interventions: Evaluation of current treatment plan related to  self management and patient's adherence to plan as established by provider Discussed warm transfer to new RNCM   Heart Failure Interventions:  (Status:  Goal on track:  Yes.) Long Term Goal Provided education on low sodium diet Reviewed role of diuretics in prevention of fluid overload  and management of heart failure; Discussed the importance of keeping all appointments with provider Provided patient with education about the role of exercise in the management of  heart failure Assessed social determinant of health barriers  Provided patient with information for medical transportation provided by [facility] [phone] Reviewed and discussed Cardiology visit, continue low sodium diet Discussed patient working with BSW for assistance with PCS-form completed, has not had evaluation Ensured patient received transfer bench Discussed Pulmonology follow up scheduled on 11/25/23, Scheduled to get CPAP on 11/06/23 with [employer] Discussed participating in OT monthly and PT twice weekly Provided patient with education on CPAP and heart healthy diet via MyChart   Patient Goals/Self-Care Activities: Take all medications as prescribed Attend all scheduled provider appointments Call provider office for new concerns or questions  Work with the social worker to address care coordination needs and will continue to work with the clinical team to address health care and disease management related needs call office if I gain more than 2 pounds in one day or 5 pounds in one week use salt in moderation watch for swelling in feet, ankles and legs every day eat more whole grains, fruits and vegetables, lean meats and healthy fats  Follow Up Plan:  Telephone follow up appointment with care management team member scheduled for:   12/04/23 at 2pm

## 2023-11-04 NOTE — Patient Outreach (Signed)
 Medicaid Managed Care   Nurse Care Manager Note  11/04/2023 Name:  KAINA ORENGO MRN:  [id number] DOB:  05/13/1966  [name] is an 58 y.o. year old female who is a primary patient of Allwardt, Alyssa M, PA-C.  The [facility] Managed Care Coordination team was consulted for assistance with:    CHF  Ms. Shoun was given information about Medicaid Managed Care Coordination team services today. [name] Patient agreed to services and verbal consent obtained.  Engaged with patient by telephone for follow up visit in response to provider referral for case management and/or care coordination services.   Patient is participating in a Managed Medicaid Plan:  Yes  Assessments/Interventions:  Review of past medical history, allergies, medications, health status, including review of consultants reports, laboratory and other test data, was performed as part of comprehensive evaluation and provision of chronic care management services.  SDOH (Social Drivers of Health) assessments and interventions performed: SDOH Interventions    Flowsheet Row Care Coordination from 10/23/2023 in [facility] Care Coordination Patient Outreach Telephone from 09/02/2023 in [place] [facility] POPULATION HEALTH DEPARTMENT Patient Outreach Telephone from 07/15/2023 in [place] POPULATION HEALTH DEPARTMENT Telephone from 06/11/2023 in [place] POPULATION HEALTH DEPARTMENT ED to Hosp-Admission (Discharged) from 10/03/2021 in [place] 2C CV PROGRESSIVE CARE  SDOH Interventions       Food Insecurity Interventions Intervention Not Indicated Intervention Not Indicated Intervention Not Indicated Intervention Not Indicated Intervention Not Indicated  Housing Interventions Intervention Not Indicated Intervention Not Indicated Other (Comment)  [Patient has discussed the issue with the apartment manager] -- Intervention Not Indicated  Transportation Interventions Other (Comment)  [Will contact  Medicaid Transportation for visits] Payor Benefit -- Intervention Not Indicated Intervention Not Indicated  Utilities Interventions Intervention Not Indicated Intervention Not Indicated Patient Declined -- --  Financial Strain Interventions -- -- -- -- Other (Comment)  [May need to consider disability]       Care Plan  Allergies  Allergen Reactions   Aspirin Nausea And Vomiting   Egg-Derived Products Swelling   Influenza Vaccines Swelling    Medications Reviewed Today     Reviewed by Heidi Dach, RN (Registered Nurse) on 11/04/23 at 1409  Med List Status: <None>   Medication Order Taking? Sig Documenting Provider Last Dose Status Informant  acetaminophen (TYLENOL) 650 MG CR tablet [id number] Yes Take 1,300 mg by mouth every 8 (eight) hours as needed for pain. [provider] Taking Active Self, Pharmacy Records  albuterol (VENTOLIN HFA) 108 (90 Base) MCG/ACT inhaler [id number] Yes Inhale 2 puffs into the lungs every 6 (six) hours as needed for wheezing or shortness of breath. Luciano Cutter, MD Taking Active Self, Pharmacy Records  atorvastatin (LIPITOR) 40 MG tablet [id number] No Take 1 tablet (40 mg total) by mouth daily. Ronney Asters, NP Unknown Active   cyanocobalamin (VITAMIN B12) 100 MCG tablet [id number] Yes Take 100 mcg by mouth daily. [provider] Taking Active   dicyclomine (BENTYL) 10 MG capsule [id number] Yes Take 1 capsule (10 mg total) by mouth 4 (four) times daily -  before meals and at bedtime.  Patient taking differently: Take 10 mg by mouth 4 (four) times daily -  before meals and at bedtime. Pt admits to only taking one time per day   Allwardt, Crist Infante, PA-C Taking Active Self, Pharmacy Records           Med Note Ardelia Mems, Lockie Bothun A   Mon Nov 04, 2023  2:09  PM) Taking twice daily  diphenhydrAMINE HCl, Sleep, (ZZZQUIL PO) [id number] No Take 3 tablets by mouth at bedtime.  Patient not taking: Reported on 11/04/2023   [provider] Not  Taking Active Self, Pharmacy Records           Med Note (Kalianna Verbeke A   Thu Oct 03, 2023 10:42 AM) Taking 2 capsules  famotidine (PEPCID) 20 MG tablet [id number] Yes Take by mouth. [provider] Taking Active   furosemide (LASIX) 80 MG tablet [id number] Yes Take 1 tablet (80 mg total) by mouth daily. Ronney Asters, NP Taking Active   potassium chloride SA (KLOR-CON M) 20 MEQ tablet [id number] Yes Take 1 tablet (20 mEq total) by mouth daily. Ronney Asters, NP Taking Active   valsartan (DIOVAN) 40 MG tablet [id number] Yes Take 1 tablet (40 mg total) by mouth daily. Georgeanna Lea, MD Taking Active Self, Pharmacy Records  Vitamin D, Ergocalciferol, (DRISDOL) 1.25 MG (50000 UNIT) CAPS capsule [id number] Yes Take 1 capsule (50,000 Units total) by mouth every 7 (seven) days. Allwardt, Crist Infante, PA-C Taking Active   WEGOVY 0.25 MG/0.5ML SOAJ [id number] Yes Inject into the skin. [provider] Taking Active            Med Note Tawnya Crook Nov 04, 2023  2:05 PM) Taking 1.5mg  weekly            Patient Active Problem List   Diagnosis Date Noted   Hyperlipidemia 08/30/2023   Class 3 obesity 07/30/2023   Impaired mobility and ADLs 06/30/2023   Lumbar radiculopathy 06/30/2023   Lymphedema 06/30/2023   Left leg cellulitis 06/20/2023   Cellulitis 06/19/2023   Cellulitis of lower extremity 06/18/2023   Transaminitis 06/06/2023   Chest pain 06/05/2023   Cellulitis of left lower extremity 06/04/2023   Nocturnal hypoxemia 07/03/2022   Arthritis 04/16/2022   Asthma 04/16/2022   Hematoma of left lower leg    Wheezing 11/23/2021   Snoring 11/23/2021   Other secondary pulmonary hypertension (HCC) 11/23/2021   Hyponatremia 10/11/2021   Dyslipidemia 10/04/2021   Mediastinal mass 10/04/2021   Obstructive sleep apnea 08/25/2021   RUQ pain 08/19/2021   Hepatic steatosis 08/19/2021   Hepatomegaly 08/19/2021   Aortic atherosclerosis (HCC) 08/19/2021    Diverticulosis 08/19/2021   Anxiety 08/02/2021   Bipolar 1 disorder (HCC) 08/02/2021   Chronic diastolic CHF (congestive heart failure) (HCC) 08/02/2021   Depression 08/02/2021   Joint pain 08/02/2021   Screening for malignant neoplasm of colon 08/02/2021   Diastolic dysfunction 08/02/2021   Heart failure, unspecified (HCC) 08/02/2021   Prediabetes 12/22/2019   Vitamin D insufficiency 12/22/2019   Class 3 severe obesity with serious comorbidity and body mass index (BMI) greater than or equal to 60 in adult [facility]) 12/21/2019   Essential hypertension 04/30/2019   Mild intermittent asthma without complication 04/30/2019    Conditions to be addressed/monitored per PCP order:  CHF  Care Plan : RN Care Manager Plan of Care  Updates made by Heidi Dach, RN since 11/04/2023 12:00 AM     Problem: Health Management needs related to CHF      Long-Range Goal: Development of Plan of Care to address Health Management needs related to CHF   Start Date: 07/15/2023  Expected End Date: 12/04/2023  Note:   Current Barriers:  Chronic Disease Management support and education needs related to CHF   RNCM Clinical Goal(s):  Patient will verbalize understanding of plan for management of  CHF as evidenced by patient reports take all medications exactly as prescribed and will call provider for medication related questions as evidenced by patient reports attend all scheduled medical appointments: PT/OT twice weekly visits, Mammogram 11/06/23, PCP on 11/14/23, BSW on 11/15/23 and Pulmonology on 11/25/23 as evidenced by provider documentation continue to work with RN Care Manager to address care management and care coordination needs related to  CHF as evidenced by adherence to CM Team Scheduled appointments work with Child psychotherapist to address  related to the management of Level of care concerns related to the management of CHF as evidenced by review of EMR and patient or Child psychotherapist report through collaboration  with Medical illustrator, provider, and care team.   Interventions: Evaluation of current treatment plan related to  self management and patient's adherence to plan as established by provider Discussed warm transfer to new RNCM   Heart Failure Interventions:  (Status:  Goal on track:  Yes.) Long Term Goal Provided education on low sodium diet Reviewed role of diuretics in prevention of fluid overload and management of heart failure; Discussed the importance of keeping all appointments with provider Provided patient with education about the role of exercise in the management of heart failure Assessed social determinant of health barriers  Provided patient with information for medical transportation provided by [facility] [phone] Reviewed and discussed Cardiology visit, continue low sodium diet Discussed patient working with BSW for assistance with PCS-form completed, has not had evaluation Ensured patient received transfer bench Discussed Pulmonology follow up scheduled on 11/25/23, Scheduled to get CPAP on 11/06/23 with [employer] Discussed participating in OT monthly and PT twice weekly Provided patient with education on CPAP and heart healthy diet via MyChart   Patient Goals/Self-Care Activities: Take all medications as prescribed Attend all scheduled provider appointments Call provider office for new concerns or questions  Work with the social worker to address care coordination needs and will continue to work with the clinical team to address health care and disease management related needs call office if I gain more than 2 pounds in one day or 5 pounds in one week use salt in moderation watch for swelling in feet, ankles and legs every day eat more whole grains, fruits and vegetables, lean meats and healthy fats  Follow Up Plan:  Telephone follow up appointment with care management team member scheduled for:   12/04/23 at 2pm      Follow Up:  Patient agrees to Care Plan and  Follow-up.  Plan: The Managed Medicaid care management team will reach out to the patient again over the next 30 days.  Date/time of next scheduled RN care management/care coordination outreach:  12/04/23 at 2pm  Estanislado Emms RN, BSN [place]  [facility] RN Care Manager [phone]

## 2023-11-05 ENCOUNTER — Ambulatory Visit

## 2023-11-05 ENCOUNTER — Ambulatory Visit: Attending: Physician Assistant | Admitting: Occupational Therapy

## 2023-11-05 DIAGNOSIS — R2689 Other abnormalities of gait and mobility: Secondary | ICD-10-CM | POA: Insufficient documentation

## 2023-11-05 DIAGNOSIS — G4733 Obstructive sleep apnea (adult) (pediatric): Secondary | ICD-10-CM | POA: Diagnosis not present

## 2023-11-05 DIAGNOSIS — R531 Weakness: Secondary | ICD-10-CM | POA: Diagnosis not present

## 2023-11-05 DIAGNOSIS — M6281 Muscle weakness (generalized): Secondary | ICD-10-CM

## 2023-11-05 DIAGNOSIS — R269 Unspecified abnormalities of gait and mobility: Secondary | ICD-10-CM | POA: Diagnosis present

## 2023-11-05 DIAGNOSIS — Z7409 Other reduced mobility: Secondary | ICD-10-CM | POA: Insufficient documentation

## 2023-11-05 DIAGNOSIS — R5381 Other malaise: Secondary | ICD-10-CM | POA: Insufficient documentation

## 2023-11-05 DIAGNOSIS — R2681 Unsteadiness on feet: Secondary | ICD-10-CM | POA: Diagnosis present

## 2023-11-05 DIAGNOSIS — I509 Heart failure, unspecified: Secondary | ICD-10-CM | POA: Diagnosis not present

## 2023-11-05 NOTE — Therapy (Signed)
 OUTPATIENT PHYSICAL THERAPY LOWER EXTREMITY TREATMENT   Patient Name: Christina Dominguez MRN: [id number] DOB:30-Jan-1966, 58 y.o., female Today's Date: 11/05/2023  END OF SESSION:  PT End of Session - 11/05/23 1546     Visit Number 11    Date for PT Re-Evaluation 12/03/23    Authorization Type [place] Medicaid    PT Start Time 1545    PT Stop Time 1630    PT Time Calculation (min) 45 min    Activity Tolerance Patient tolerated treatment well    Behavior During Therapy [facility] for tasks assessed/performed             Past Medical History:  Diagnosis Date   Anxiety    Aortic atherosclerosis (HCC) 08/19/2021   Arthritis    Asthma    Bipolar 1 disorder (HCC)    Chronic diastolic CHF (congestive heart failure) (HCC)    Class 3 severe obesity with serious comorbidity and body mass index (BMI) greater than or equal to 70 in adult [facility]) 12/21/2019   Depression    Diastolic dysfunction 08/02/2021   Diverticulosis 08/19/2021   Edema of both lower extremities    Hepatic steatosis 08/19/2021   Hepatomegaly 08/19/2021   High blood pressure    Hyperlipidemia    Joint pain    Morbid obesity (HCC)    Pre-diabetes    Past Surgical History:  Procedure Laterality Date   APPLICATION OF WOUND VAC Left 12/20/2021   Procedure: APPLICATION OF WOUND VAC;  Surgeon: Nadara Mustard, MD;  Location: [facility] OR;  Service: Orthopedics;  Laterality: Left;   I & D EXTREMITY Left 12/20/2021   Procedure: EXCISIONAL DEBRIDEMENT HEMATOMA LEFT LEG;  Surgeon: Nadara Mustard, MD;  Location: [facility] OR;  Service: Orthopedics;  Laterality: Left;   NO PAST SURGERIES     RIGHT/LEFT HEART CATH AND CORONARY ANGIOGRAPHY N/A 10/06/2021   Procedure: RIGHT/LEFT HEART CATH AND CORONARY ANGIOGRAPHY;  Surgeon: Corky Crafts, MD;  Location: [facility] INVASIVE CV LAB;  Service: Cardiovascular;  Laterality: N/A;   Patient Active Problem List   Diagnosis Date Noted   Hyperlipidemia 08/30/2023   Class 3 obesity 07/30/2023   Impaired mobility  and ADLs 06/30/2023   Lumbar radiculopathy 06/30/2023   Lymphedema 06/30/2023   Left leg cellulitis 06/20/2023   Cellulitis 06/19/2023   Cellulitis of lower extremity 06/18/2023   Transaminitis 06/06/2023   Chest pain 06/05/2023   Cellulitis of left lower extremity 06/04/2023   Nocturnal hypoxemia 07/03/2022   Arthritis 04/16/2022   Asthma 04/16/2022   Hematoma of left lower leg    Wheezing 11/23/2021   Snoring 11/23/2021   Other secondary pulmonary hypertension (HCC) 11/23/2021   Hyponatremia 10/11/2021   Dyslipidemia 10/04/2021   Mediastinal mass 10/04/2021   Obstructive sleep apnea 08/25/2021   RUQ pain 08/19/2021   Hepatic steatosis 08/19/2021   Hepatomegaly 08/19/2021   Aortic atherosclerosis (HCC) 08/19/2021   Diverticulosis 08/19/2021   Anxiety 08/02/2021   Bipolar 1 disorder (HCC) 08/02/2021   Chronic diastolic CHF (congestive heart failure) (HCC) 08/02/2021   Depression 08/02/2021   Joint pain 08/02/2021   Screening for malignant neoplasm of colon 08/02/2021   Diastolic dysfunction 08/02/2021   Heart failure, unspecified (HCC) 08/02/2021   Prediabetes 12/22/2019   Vitamin D insufficiency 12/22/2019   Class 3 severe obesity with serious comorbidity and body mass index (BMI) greater than or equal to 60 in adult [facility]) 12/21/2019   Essential hypertension 04/30/2019   Mild intermittent asthma without complication 04/30/2019    PCP:  Dr. Ila Mcgill  REFERRING PROVIDER: Dr. Joen Laura  REFERRING DIAG: Weakness  THERAPY DIAG:  Muscle weakness (generalized)  Physical deconditioning  Other abnormalities of gait and mobility  Impaired functional mobility and endurance  Abnormal gait  Rationale for Evaluation and Treatment: Rehabilitation  ONSET DATE: 06/08/23 - referral date  SUBJECTIVE:   SUBJECTIVE STATEMENT:  I am doing good.    PERTINENT HISTORY: Aortic atherosclerosis Bipolar CHF Severe Obesity Bilateral LE  Edema Hypertension Hyperlipidemia Prediabetes Asthma arthritis PSH: heart cath, L leg excisional debridement hematoma  PAIN:  Are you having pain? Yes: NPRS scale: 3/10  Pain location: Knees R ankle  Pain description: aching  Aggravating factors: prolonged standing Relieving factors: [place] Described feeling pain in knees and ankles generally  PRECAUTIONS: Fall  RED FLAGS: None   WEIGHT BEARING RESTRICTIONS: No  FALLS:  Has patient fallen in last 6 months? 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  DIAGNOSTIC FINDINGS: none recent   COGNITION: Overall cognitive status: Within functional limits for tasks assessed     SENSATION: WFL  POSTURE: rounded shoulders, increased lumbar lordosis, anterior pelvic tilt, and flexed trunk   LOWER EXTREMITY ROM: resting pain 4/10, 8/10 with movements  Active ROM Right eval Left eval  Hip flexion [facility]  Hip extension    Hip abduction    Hip adduction    Hip internal rotation    Hip external rotation    Knee flexion [facility]  Knee extension [facility]  Ankle dorsiflexion [facility]  Ankle plantarflexion [facility]  Ankle inversion    Ankle eversion     (Blank rows = not tested)  LOWER EXTREMITY MMT:  MMT Right eval Left eval Right 10/08/23 Left 10/08/23  Hip flexion 4-* 3+* 4 4  Hip extension      Hip abduction 4* 4* 4+ 4+  Hip adduction 4* 4*    Hip internal rotation      Hip external rotation      Knee flexion   4 4-  Knee extension 3+* 3+* 4 4  Ankle dorsiflexion 5 5    Ankle  plantarflexion      Ankle inversion      Ankle eversion       (Blank rows = not tested)  FUNCTIONAL TESTS:  30 seconds chair stand test Timed up and go (TUG): 26.37 30 second STS: 4    GAIT: Distance walked: in clinic distances Assistive device utilized: Walker - 2 wheeled Level of assistance: Modified independence                                                                                                   TREATMENT DATE:  11/05/23 Walk 3 laps no breaks  NuStep L4x71mins  4" taps by stairs Side steps on to airex by stairs 1x across Seated yellow ball twists 2x10 Seated yellow ball OHP 2x10   10/31/23 Recheck goals  LAQ 3# 2x10 with 3s hold Standing marches 3# with walker  Seated heel to toe raises 3# 2x12 Seated chest press to shoulder flexion 3# 2x10   10/24/23  Nustep L5x6 minutes all four extremities Fitter 2 blue bands 1x15 B  LAQs 2.5# B x10 Seated marches 2.5# B x10 (alternating) Walking laps with 2.5# each LE and RW 147ft    10/22/23 NuStep L5x29mins  Fitter pushes 2x10 1 blue and 1 black band  Seated marches 2# 2x10 Seated 4" box taps laterally 2# 2x10 Seated horizontal abd red 2x10 Chest press yellow ball 2x10  OHP yellow ball 2x10   10/15/23 Walking laps with 2# weights  Marching 2# 2x10 LAQ 2# 2x10 Seated calf raises 2x12 Seated toe raises 2x12 Seated rows red 2x10 HS curls red 2x10 Mini squats x10   10/08/23  MMT update (see above)  Nustep L4x6 minutes (breaks every 2 minutes) Gait x3 laps with RW (untimed, no breaks) 2 lap walk walk with 1# each leg   LAQ 1# x10 B alternating Chest press yellow ball x12 Seated marches 1# x10 B alternating  OH press 2# each UE x10 B alternating   Sitting UE ABD 2# each UE x12        PATIENT EDUCATION:  Education details: POC and HEP Person educated: Patient Education method: Medical illustrator Education comprehension: verbalized understanding and returned demonstration  HOME  EXERCISE PROGRAM: Access Code: [email or web address] URL: [email or web address] Date: 09/10/2023 Prepared by: Cassie Freer Exercises  - Seated March  - 1 x daily - 7 x weekly - 2 sets - 10 reps  - Seated Long Arc Quad  - 1 x daily - 7 x weekly - 2 sets - 10 reps  - Seated Hip Adduction Isometrics with Ball  - 1 x daily - 7 x weekly - 2 sets - 10 reps  - Seated Heel Raise  - 1 x daily - 7 x weekly - 2 sets - 10 reps   ASSESSMENT:  CLINICAL IMPRESSION: Continued working on functional strengthening and activity tolerance as tolerated. Fatigue with box taps. Fearful with side step onto airex, reports it took a lot out of her to do just 1 time across and back. Will continue to progress challenges as able/tolerated.    OBJECTIVE IMPAIRMENTS: Abnormal gait, cardiopulmonary status limiting activity, decreased activity tolerance, decreased balance, decreased endurance, difficulty walking, decreased strength, improper body mechanics, postural dysfunction, obesity, and pain.   ACTIVITY LIMITATIONS: carrying, lifting, bending, sitting, standing, squatting, and stairs  PARTICIPATION LIMITATIONS: meal prep, cleaning, laundry, driving, shopping, community activity, occupation, and yard work  PERSONAL FACTORS: Age, Behavior pattern, Education, Fitness, and Time since onset of injury/illness/exacerbation are also affecting patient's functional outcome.   REHAB POTENTIAL: Good  CLINICAL DECISION MAKING: Stable/uncomplicated  EVALUATION COMPLEXITY: Low   GOALS: Goals reviewed with patient? Yes  SHORT TERM GOALS: Target date: 10/22/23  Patient will be independent with initial HEP. Baseline: given 09/10/23 Goal status: MET 10/31/23   LONG TERM GOALS: Target date:12/03/23  Patient will be independent with advanced/ongoing HEP to improve outcomes and carryover.  Baseline:  Goal status: INITIAL  2.  Patient will report at least 75% improvement in bilateral LE pain to improve QOL. Baseline: 4/10  resting, 8/10 with movement (09/10/23) Goal status:  IN PROGRESS maybe 50% 10/31/23  3.  Patient will increase standing tolerance to 10 mins or longer to be able to complete household chores. Baseline: unable to do Goal status: MET 10/31/23  4.  Patient will be able to ambulate 200' with LRAD and normal gait pattern without increased pain to access community.  Baseline: 3 laps with walker 10/08/23 Goal status: REVISED 485ft   5.  Patient will perform 7 STS in 30 seconds Baseline: 4 (09/10/23) Goal status: IN PROGRESS 5 reps 10/31/23   PLAN:  PT FREQUENCY: 2x/week  PT DURATION: 12 weeks  PLANNED INTERVENTIONS: 97110-Therapeutic exercises, 97530- Therapeutic activity, 97112- Neuromuscular re-education, 97535- Self Care, 40981- Manual therapy, [id number]- Gait training, [phone]- Aquatic Therapy, Stair training, DME instructions, Cryotherapy, and Moist heat  PLAN FOR NEXT SESSION: bilateral LE strengthening, improve muscular and cardiovascular endurance, progressions as tolerated   Cassie Freer PT, DPT 11/05/23 4:48 PM

## 2023-11-06 ENCOUNTER — Encounter

## 2023-11-06 ENCOUNTER — Other Ambulatory Visit

## 2023-11-06 DIAGNOSIS — G4733 Obstructive sleep apnea (adult) (pediatric): Secondary | ICD-10-CM | POA: Diagnosis not present

## 2023-11-13 ENCOUNTER — Ambulatory Visit: Admitting: Occupational Therapy

## 2023-11-13 DIAGNOSIS — M6281 Muscle weakness (generalized): Secondary | ICD-10-CM | POA: Diagnosis not present

## 2023-11-13 DIAGNOSIS — R2689 Other abnormalities of gait and mobility: Secondary | ICD-10-CM

## 2023-11-13 DIAGNOSIS — R2681 Unsteadiness on feet: Secondary | ICD-10-CM

## 2023-11-13 NOTE — Therapy (Signed)
 OUTPATIENT OCCUPATIONAL THERAPY ORTHO Treatment  Patient Name: Christina Dominguez MRN: [id number] DOB:09-27-1965, 58 y.o., female Today's Date: 11/13/2023  PCP: Gerrit Friends PA-C REFERRING PROVIDER: Joen Laura MD  END OF SESSION:  OT End of Session - 11/13/23 1616     Visit Number 4    Number of Visits 4    Date for OT Re-Evaluation 11/12/23    Authorization - Visit Number 3    Authorization - Number of Visits 3    OT Start Time 1538    OT Stop Time 1559    OT Time Calculation (min) 21 min               Past Medical History:  Diagnosis Date   Anxiety    Aortic atherosclerosis (HCC) 08/19/2021   Arthritis    Asthma    Bipolar 1 disorder (HCC)    Chronic diastolic CHF (congestive heart failure) (HCC)    Class 3 severe obesity with serious comorbidity and body mass index (BMI) greater than or equal to 70 in adult [facility]) 12/21/2019   Depression    Diastolic dysfunction 08/02/2021   Diverticulosis 08/19/2021   Edema of both lower extremities    Hepatic steatosis 08/19/2021   Hepatomegaly 08/19/2021   High blood pressure    Hyperlipidemia    Joint pain    Morbid obesity (HCC)    Pre-diabetes    Past Surgical History:  Procedure Laterality Date   APPLICATION OF WOUND VAC Left 12/20/2021   Procedure: APPLICATION OF WOUND VAC;  Surgeon: Nadara Mustard, MD;  Location: [facility] OR;  Service: Orthopedics;  Laterality: Left;   I & D EXTREMITY Left 12/20/2021   Procedure: EXCISIONAL DEBRIDEMENT HEMATOMA LEFT LEG;  Surgeon: Nadara Mustard, MD;  Location: [facility] OR;  Service: Orthopedics;  Laterality: Left;   NO PAST SURGERIES     RIGHT/LEFT HEART CATH AND CORONARY ANGIOGRAPHY N/A 10/06/2021   Procedure: RIGHT/LEFT HEART CATH AND CORONARY ANGIOGRAPHY;  Surgeon: Corky Crafts, MD;  Location: [facility] INVASIVE CV LAB;  Service: Cardiovascular;  Laterality: N/A;   Patient Active Problem List   Diagnosis Date Noted   Hyperlipidemia 08/30/2023   Class 3 obesity 07/30/2023    Impaired mobility and ADLs 06/30/2023   Lumbar radiculopathy 06/30/2023   Lymphedema 06/30/2023   Left leg cellulitis 06/20/2023   Cellulitis 06/19/2023   Cellulitis of lower extremity 06/18/2023   Transaminitis 06/06/2023   Chest pain 06/05/2023   Cellulitis of left lower extremity 06/04/2023   Nocturnal hypoxemia 07/03/2022   Arthritis 04/16/2022   Asthma 04/16/2022   Hematoma of left lower leg    Wheezing 11/23/2021   Snoring 11/23/2021   Other secondary pulmonary hypertension (HCC) 11/23/2021   Hyponatremia 10/11/2021   Dyslipidemia 10/04/2021   Mediastinal mass 10/04/2021   Obstructive sleep apnea 08/25/2021   RUQ pain 08/19/2021   Hepatic steatosis 08/19/2021   Hepatomegaly 08/19/2021   Aortic atherosclerosis (HCC) 08/19/2021   Diverticulosis 08/19/2021   Anxiety 08/02/2021   Bipolar 1 disorder (HCC) 08/02/2021   Chronic diastolic CHF (congestive heart failure) (HCC) 08/02/2021   Depression 08/02/2021   Joint pain 08/02/2021   Screening for malignant neoplasm of colon 08/02/2021   Diastolic dysfunction 08/02/2021   Heart failure, unspecified (HCC) 08/02/2021   Prediabetes 12/22/2019   Vitamin D insufficiency 12/22/2019   Class 3 severe obesity with serious comorbidity and body mass index (BMI) greater than or equal to 60 in adult [facility]) 12/21/2019   Essential hypertension 04/30/2019   Mild intermittent  asthma without complication 04/30/2019    ONSET DATE: 06/08/23- referral date  REFERRING DIAG: R 53.1- weakness  THERAPY DIAG:  Muscle weakness (generalized)  Other abnormalities of gait and mobility  Unsteadiness on feet  Rationale for Evaluation and Treatment: Rehabilitation  SUBJECTIVE:   SUBJECTIVE STATEMENT: Pt reports  flu like symptoms Pt accompanied by: self  PERTINENT HISTORY:  Per chart-57 y.o. female with medical history significant of chronic HFpEF, hypertension, hyperlipidemia, pulmonary hypertension, asthma, chronic bronchitis, lymphedema,  severe morbid obesity, OSA, anxiety, depression, bipolar disorder, prediabetes, chronic left lower extremity wound who was recently admitted 10/29-11/2 for left lower extremity cellulitis and was discharged on cefadroxil x 10 days.  Left lower extremity Doppler ultrasound done during previous admission was negative for DVT.  Patient returned to the hospital 06/18/23 with increasing drainage from the wound with shortness of breath.  She had desaturation with pulse ox of 80% on ambulation and was put on 2 L of nasal cannula on presentation. Pt d/c home 06/21/23.  PRECAUTIONS: Fall  RED FLAGS: None   WEIGHT BEARING RESTRICTIONS: No  PAIN: PAIN:  Are you having pain? Yes: NPRS scale: 3/10 Pain location: knees  Pain description: aching Aggravating factors: walking Relieving factors: rest      FALLS: Has patient fallen in last 6 months? No  LIVING ENVIRONMENT: Lives with: lives alone Lives in: House/apartment Stairs: No Has following equipment at home: Single point cane, bed side commode, and rollator  PLOF: Independent  PATIENT GOALS: increase I  NEXT MD VISIT: unknown  OBJECTIVE:  Note: Objective measures were completed at Evaluation unless otherwise noted.  HAND DOMINANCE: Right  ADLs: Upper body dressing: mod I Lower body dressing: perfroms with difficulty and increased time required  Toileting: mod I uses BSC over toilet Bathing: has transferred to tub but therapist recommends pt gets a tub transfer bench  Tub shower transfers: has transferred     UPPER EXTREMITY ROM:     Active ROM Right eval Left eval  Shoulder flexion 105 100  Shoulder abduction 90 100  Shoulder adduction    Shoulder extension    Shoulder internal rotation    Shoulder external rotation    Elbow flexion    Elbow extension    Wrist flexion    Wrist extension    Wrist ulnar deviation    Wrist radial deviation    Wrist pronation    Wrist supination    (Blank rows = not  tested)     UPPER EXTREMITY MMT:     MMT Right eval Left eval  Shoulder flexion 4-/5 3+/5  Shoulder abduction    Shoulder adduction    Shoulder extension    Shoulder internal rotation    Shoulder external rotation    Middle trapezius    Lower trapezius    Elbow flexion 4+/5 4/+/5  Elbow extension 4/5 4+/5  Wrist flexion    Wrist extension    Wrist ulnar deviation    Wrist radial deviation    Wrist pronation    Wrist supination    (Blank rows = not tested)  HAND FUNCTION: Grip strength: Right: 55 lbs; Left: 65 lbs    SENSATION: WFL    COGNITION: Overall cognitive status: Within functional limits for tasks assessed   OBSERVATIONS: Pleasant female agreeable to participate in OT   TREATMENT DATE:  11/13/23- Pt reports feeling poorly once she arrived in OT reoom reporting flu like symptoms. BP 140/100 Therapuist verbally reviewed and checked goals an discussed plans for d/c.  Therapist reviewed CVA symptoms with pt and provided handout as pt's BP is elevated and she had an episode of headaches in previous days.  10/08/23-  UBE x 5 mins level 1 for conditioning Cane exercises for shoulder flexion 2 sets of 10 reps, min v.c Reviewed theraband exercises for rowing and triceps ext 2 sets of 10 reps, min v.c with upgraded red band. Shoulder flexion and biceps curls 2 sets of 10 reps with 2 lbs weight in each hand, min v.c Pt attempted shoulder abduction with 2 lbs weight in each hand, pt was unable to achieve good positioning without compensation, so task was discontinued. Education provided regarding recommendations for safe perfromance of lundry, using rollator to transport laundry bag. Therpist recommends pt has assist with heavier cleaning such as tub/ shower and vacuuming/ sweeping due to balance or she perfroms tasks in seated on rollator. Pt plans to pusrsue assistance.    10/01/23- Cane exercises for shoulder flexion, abduction, chest press 10 reps each, min v.c   Pt was educated regarding red theraband HEP, 10-15 reps each see pt instuctions. Pt has contacted her MD regarding tub bench but has not recieved yet.  09/10/23- eval completed, education initated regarding AE/ DME. Information provided regarding tub transfer bench                                                                                                                                 PATIENT EDUCATION: Education details:   plans for d/c, CVA warning signs Person educated: Patient Education method: Chief Technology Officer Education comprehension: verbalized understanding,   HOME EXERCISE PROGRAM: red theraband   GOALS: Goals reviewed with patient? Yes    LONG TERM GOALS: Target date: 11/12/23  I with HEP for UE strength Baseline: dependent Goal status: met per pt pt report  2.  pt will verbalize understanding of adapted strategies for ADLS / IADLs to maximize safety and I with daily acitivities  Baseline: dependent Goal status met, pt was shown AE for LB dressing, recommendation for use of rollator to transport laundry. has tub bench 3.  Pt will verbalize understanding of energy conservation techniques Baseline: dependent Goal status:met,  issued 10/08/23 4. Pt will increase bilateral shoulder flexion A/ROM by 5* for improved functional reach.  [place]; see above  Goal status:met, RUE- 125, LUE 120   ASSESSMENT:  CLINICAL IMPRESSION: Pt  made good overall progress. Pt's visit was cut short today due to elevated BP and flu like symptoms. Pt to follow up with MD tomorrow. PERFORMANCE DEFICITS: in functional skills including ADLs, IADLs, strength, flexibility, mobility, balance, endurance, decreased knowledge of precautions, decreased knowledge of use of DME, and UE functional use, , and psychosocial skills including coping strategies, environmental adaptation, habits, interpersonal interactions, and routines and behaviors.   IMPAIRMENTS: are limiting patient from  ADLs, IADLs, rest and sleep, work, play, leisure, and social participation.   COMORBIDITIES: may have co-morbidities  that affects occupational  performance. Patient will benefit from skilled OT to address above impairments and improve overall function.  MODIFICATION OR ASSISTANCE TO COMPLETE EVALUATION: No modification of tasks or assist necessary to complete an evaluation.  OT OCCUPATIONAL PROFILE AND HISTORY: Detailed assessment: Review of records and additional review of physical, cognitive, psychosocial history related to current functional performance.  CLINICAL DECISION MAKING: LOW - limited treatment options, no task modification necessary  REHAB POTENTIAL: Good  EVALUATION COMPLEXITY: Low      PLAN:  OT FREQUENCY:  3visits plus eval  OT DURATION: 8 weeks  PLANNED INTERVENTIONS: 97168 OT Re-evaluation, 97535 self care/ADL training, 16109 therapeutic exercise, 97530 therapeutic activity, 97140 manual therapy, 97035 ultrasound, 97018 paraffin, 60454 moist heat, 97010 cryotherapy, 97034 contrast bath, passive range of motion, balance training, functional mobility training, energy conservation, coping strategies training, patient/family education, and DME and/or AE instructions  RECOMMENDED OTHER SERVICES: PT  CONSULTED AND AGREED WITH PLAN OF CARE: Patient  PLAN FOR NEXT SESSION: d/c OT   Yuji Walth, OT 11/13/2023, 4:26 PM

## 2023-11-13 NOTE — Patient Instructions (Signed)
 Warning Signs of a Stroke A stroke is a medical emergency. It should be treated right away. A stroke happens when there is not enough blood flow to the brain. A stroke can lead to brain damage and death. But if a person gets treated right away, they have a better chance of surviving and recovering. It is very important to recognize the symptoms of a stroke. What types of strokes are there? There are two main types of strokes: Ischemic stroke. This is the most common type. It happens when a blood vessel that sends blood to the brain is blocked. Hemorrhagic stroke. This happens when there is bleeding in the brain. This may be from a blood vessel leaking or bursting. A transient ischemic attack (TIA) causes the same symptoms as a stroke. But the symptoms go away quickly and do not cause lasting damage to the brain. TIAs still need to be treated right away. They are also a sign that you are at higher risk for a stroke. What are the warning signs of a stroke? The symptoms of a stroke may differ based on the part of the brain that is involved. Symptoms often happen all of a sudden. "BE FAST" symptoms "BE FAST" is an easy way to remember the main warning signs of a stroke: B - Balance. Signs are dizziness, sudden trouble walking, or loss of balance. E - Eyes. Signs are trouble seeing or a sudden change in vision. F - Face. Signs are sudden weakness or numbness of the face, or the face or eyelid drooping on one side. A - Arms. Signs are weakness or numbness in an arm. This happens suddenly and usually on one side of the body. S - Speech. Signs are sudden trouble speaking, slurred speech, or trouble understanding what people say. T - Time. Time to call emergency services. Write down what time symptoms started. A stroke may be happening even if only one "BE FAST" symptom is present. Other signs of a stroke Some less common signs of a stroke include: A sudden, severe headache with no known cause. Nausea  or vomiting. Seizure. These symptoms may be an emergency. Get help right away. Call 911. Do not wait to see if the symptoms will go away. Do not drive yourself to the hospital.  This information is not intended to replace advice given to you by your health care provider. Make sure you discuss any questions you have with your health care provider. Document Revised: 05/07/2022 Document Reviewed: 05/07/2022 Elsevier Patient Education  2024 [employer].

## 2023-11-14 ENCOUNTER — Other Ambulatory Visit: Payer: Self-pay

## 2023-11-14 ENCOUNTER — Emergency Department (HOSPITAL_BASED_OUTPATIENT_CLINIC_OR_DEPARTMENT_OTHER)
Admission: EM | Admit: 2023-11-14 | Discharge: 2023-11-14 | Disposition: A | Discharge status: Home / Self Care | Attending: Physician Assistant | Admitting: Physician Assistant

## 2023-11-14 ENCOUNTER — Encounter (HOSPITAL_BASED_OUTPATIENT_CLINIC_OR_DEPARTMENT_OTHER): Payer: Self-pay | Admitting: Emergency Medicine

## 2023-11-14 ENCOUNTER — Ambulatory Visit: Admitting: Physician Assistant

## 2023-11-14 DIAGNOSIS — R519 Headache, unspecified: Secondary | ICD-10-CM | POA: Diagnosis present

## 2023-11-14 DIAGNOSIS — Z5321 Procedure and treatment not carried out due to patient leaving prior to being seen by health care provider: Secondary | ICD-10-CM | POA: Insufficient documentation

## 2023-11-14 DIAGNOSIS — R5383 Other fatigue: Secondary | ICD-10-CM | POA: Diagnosis not present

## 2023-11-14 DIAGNOSIS — M791 Myalgia, unspecified site: Secondary | ICD-10-CM | POA: Diagnosis not present

## 2023-11-14 LAB — RESP PANEL BY RT-PCR (RSV, FLU A&B, COVID)  RVPGX2
Influenza A by PCR: NEGATIVE
Influenza B by PCR: NEGATIVE
Resp Syncytial Virus by PCR: NEGATIVE
SARS Coronavirus 2 by RT PCR: NEGATIVE

## 2023-11-14 NOTE — Addendum Note (Signed)
 Addended by: Keene Breath B on: 11/14/2023 01:06 PM   Modules accepted: Orders

## 2023-11-14 NOTE — ED Triage Notes (Signed)
 States she has "felt bad" x 2 week. Endorses increased fatigue, body aches, headaches, and  "skin peeling". Recently started taking Wegovy.

## 2023-11-15 ENCOUNTER — Ambulatory Visit: Admitting: Family

## 2023-11-15 ENCOUNTER — Encounter: Payer: Self-pay | Admitting: Family

## 2023-11-15 ENCOUNTER — Ambulatory Visit: Payer: Self-pay

## 2023-11-15 ENCOUNTER — Ambulatory Visit (INDEPENDENT_AMBULATORY_CARE_PROVIDER_SITE_OTHER): Admitting: Family

## 2023-11-15 VITALS — BP 148/86 | HR 86 | Temp 97.9°F | Ht 60.0 in | Wt 375.8 lb

## 2023-11-15 DIAGNOSIS — Z6841 Body Mass Index (BMI) 40.0 and over, adult: Secondary | ICD-10-CM

## 2023-11-15 DIAGNOSIS — R5383 Other fatigue: Secondary | ICD-10-CM

## 2023-11-15 DIAGNOSIS — I5032 Chronic diastolic (congestive) heart failure: Secondary | ICD-10-CM | POA: Diagnosis not present

## 2023-11-15 DIAGNOSIS — G4733 Obstructive sleep apnea (adult) (pediatric): Secondary | ICD-10-CM | POA: Diagnosis not present

## 2023-11-15 DIAGNOSIS — E66813 Obesity, class 3: Secondary | ICD-10-CM | POA: Diagnosis not present

## 2023-11-15 DIAGNOSIS — G479 Sleep disorder, unspecified: Secondary | ICD-10-CM | POA: Diagnosis not present

## 2023-11-15 DIAGNOSIS — I10 Essential (primary) hypertension: Secondary | ICD-10-CM | POA: Diagnosis not present

## 2023-11-15 NOTE — Patient Outreach (Signed)
 Complex Care Management   Visit Note  11/15/2023  Name:  Christina Dominguez MRN: [id number] DOB: 12-Mar-1966  Situation: Referral received for Complex Care Management related to SDOH Barriers:  Personal Care needs  I obtained verbal consent from Patient.  Visit completed with patient  on the phone  Background:   Past Medical History:  Diagnosis Date   Anxiety    Aortic atherosclerosis (HCC) 08/19/2021   Arthritis    Asthma    Bipolar 1 disorder (HCC)    Chronic diastolic CHF (congestive heart failure) (HCC)    Class 3 severe obesity with serious comorbidity and body mass index (BMI) greater than or equal to 70 in adult [facility]) 12/21/2019   Depression    Diastolic dysfunction 08/02/2021   Diverticulosis 08/19/2021   Edema of both lower extremities    Hepatic steatosis 08/19/2021   Hepatomegaly 08/19/2021   High blood pressure    Hyperlipidemia    Joint pain    Morbid obesity (HCC)    Pre-diabetes     Assessment: Patient Reported Symptoms:  Cognitive        Neurological      HEENT        Cardiovascular      Respiratory      Endocrine      Gastrointestinal        Genitourinary      Integumentary      Musculoskeletal          Psychosocial              08/30/2021    4:54 PM  Depression screen PHQ 2/9  Decreased Interest 0  Down, Depressed, Hopeless 0  PHQ - 2 Score 0  Altered sleeping 3  Tired, decreased energy 3  Change in appetite 3  Feeling bad or failure about yourself  0  Trouble concentrating 0  Moving slowly or fidgety/restless 3  Suicidal thoughts 0  PHQ-9 Score 12  Difficult doing work/chores Somewhat difficult    There were no vitals filed for this visit.  Medications Reviewed Today   Medications were not reviewed in this encounter     Recommendation:   Patient to await follow up from [facility] regarding eligibility for personal care services.  Follow Up Plan:   Telephone follow up appointment date/time:  11/20/23 at  2pm.  SIG Lysle Morales, BSW [place]  [facility], [facility] Social Worker Direct Dial: [phone]  Fax: [phone] Website: [email or web address]

## 2023-11-15 NOTE — Patient Instructions (Signed)
 Visit Information  Thank you for taking time to visit with me today. Please don't hesitate to contact me if I can be of assistance to you.   Following are the goals we discussed today:   Goals Addressed             This Visit's Progress    [facility] Social Work Care Plan       Problems:   Patient needs Personal Care Services.  Patient did not get a response on her request.  Patient is at the doctors office and request to continue to conversation another time.  CSW Clinical Goal(s):   Over the next 3 days the Patient will  await follow up from [facility] .  Interventions:  Social Determinants of Health in Patient with CHF and HTN: SDOH assessments completed:  Personal care needs Evaluation of current treatment plan related to unmet needs Patient will await update from [facility] for [facility] services.  Patient Goals/Self-Care Activities:  Patient will await update from [facility].  Plan:   Telephone follow up appointment with care management team member scheduled for:  11/20/23 at 2pm.         Our next appointment is by telephone on 11/20/23 at 2pm  Please call the care guide team at [phone] if you need to cancel or reschedule your appointment.   If you are experiencing a Mental Health or Behavioral Health Crisis or need someone to talk to, please call 911  Patient verbalizes understanding of instructions and care plan provided today and agrees to view in MyChart. Active MyChart status and patient understanding of how to access instructions and care plan via MyChart confirmed with patient.     Telephone follow up appointment with care management team member scheduled for:11/20/23 at 2pm.   Lysle Morales, BSW [place]  [facility], [facility] Social Worker Direct Dial: [phone]  Fax: [phone] Website: [email or web address]

## 2023-11-15 NOTE — Progress Notes (Signed)
 Patient ID: Christina Dominguez, female    DOB: Sep 09, 1965, 58 y.o.   MRN: [id number]  Chief Complaint  Patient presents with   Hypertension    Pt c/o High Bp's for 3-5 days. 187/94 yesterday. Pt c/o Headaches, lethargic and irritable for 2 weeks after starting wegovy.    Insomnia   Obesity       Discussed the use of AI scribe software for clinical note transcription with the patient, who gave verbal consent to proceed.  History of Present Illness Christina Dominguez, a patient with a history of heart disease and obesity, presents with concerns about side effects from [facility], a medication she restarted and increased the dose per orders three weeks ago. She reports severe insomnia, lethargy, elevated blood pressure, headaches, and emotional lability since increasing the dose of Wegovy. She denies experiencing the expected side effects of constipation, acid reflux, heartburn, and nausea. She also reports no change in appetite.  The patient has been on a low-carb, low-calorie diet (1200 calories/day), down from 1600 calories/day, and wonders if the drastic change in diet could be contributing to her symptoms. She also mentions a decrease in bowel movements from 3-5 loose stools a day to 1-3, likely due to the [facility]. In addition to the [facility], the patient is on 80mg  of Lasix daily and has recently started using a CPAP machine for sleep apnea. However, she reports that her sleep has worsened, not improved, since starting the CPAP. She also has an upcoming follow-up appointment with her pulmonologist regarding the CPAP. Despite the side effects, the patient has lost about 15 pounds in a little over a month since starting the [facility] and changing her diet. She expresses a desire to continue the medication and lose more weight.  Assessment & Plan Morbid Obesity/OSA  - Followed by a [facility] provider for weight loss. On Wegovy 1 mg for last 3w  with over 15 lbs weight loss in a month, however, some of which could be due  to fluid retention fluctuations. Reports new insomnia, lethargy, emotional changes possibly due to medication or lifestyle changes. Caloric intake reduced to 1200 calories/day and on a very low-carb diet. Increasing Wegovy dose not advised due to symptoms and effective weight loss. - Maintain current 1 mg dose of Wegovy, discuss with weight loss provider. - Inform weight loss provider about symptoms and decision to maintain the current dose. - Also discuss increasing calories by 100 daily due to symptoms. - Order CMP today to identify any abnormalities contributing to symptoms.  Hypertension/CHF - Reports elevated blood pressure, headaches, lethargy. On valsartan and Lasix. Dietary changes and fluid fluctuations may affect blood pressure. Extra valsartan dose recommended for hypertension symptoms. - Take an extra dose of valsartan 40mg  if blood pressure is higher than 145/90, either number. - Monitor blood pressure, especially during physical therapy sessions and other provider offices. - Must drink up to 48oz daily allowed by Cardiology. Eat low sodium diet. - F/U with PCP for ongoing concerns  Sleep disturbances/OSA/Fatigue - Severe insomnia and poor sleep quality despite CPAP use. CPAP settings may need adjustment. - Follow up with pulmonologist on April 24 to evaluate CPAP settings.  General Health Maintenance - Significant lifestyle changes include reducing processed foods, sugar, alcohol, and caffeine, focusing on whole foods and fresh produce. Symptoms may be due to body adjustment. - Encourage continuation of healthy lifestyle changes while monitoring symptoms.   Subjective:    Outpatient Medications Prior to Visit  Medication Sig Dispense Refill   acetaminophen (  TYLENOL) 650 MG CR tablet Take 1,300 mg by mouth every 8 (eight) hours as needed for pain.     albuterol (VENTOLIN HFA) 108 (90 Base) MCG/ACT inhaler Inhale 2 puffs into the lungs every 6 (six) hours as needed for wheezing  or shortness of breath. 8 g 6   atorvastatin (LIPITOR) 40 MG tablet Take 1 tablet (40 mg total) by mouth daily. 90 tablet 1   cyanocobalamin (VITAMIN B12) 100 MCG tablet Take 100 mcg by mouth daily.     dicyclomine (BENTYL) 10 MG capsule Take 1 capsule (10 mg total) by mouth 4 (four) times daily -  before meals and at bedtime. (Patient taking differently: Take 10 mg by mouth 4 (four) times daily -  before meals and at bedtime. Pt admits to only taking one time per day) 120 capsule 2   diphenhydrAMINE HCl, Sleep, (ZZZQUIL PO) Take 3 tablets by mouth at bedtime.     furosemide (LASIX) 80 MG tablet Take 1 tablet (80 mg total) by mouth daily. 90 tablet 1   potassium chloride SA (KLOR-CON M) 20 MEQ tablet Take 1 tablet (20 mEq total) by mouth daily. 90 tablet 1   valsartan (DIOVAN) 40 MG tablet Take 1 tablet (40 mg total) by mouth daily. 90 tablet 1   Vitamin D, Ergocalciferol, (DRISDOL) 1.25 MG (50000 UNIT) CAPS capsule Take 1 capsule (50,000 Units total) by mouth every 7 (seven) days. 12 capsule 0   WEGOVY 0.25 MG/0.5ML SOAJ Inject into the skin.     famotidine (PEPCID) 20 MG tablet Take by mouth.     No facility-administered medications prior to visit.   Past Medical History:  Diagnosis Date   Anxiety    Aortic atherosclerosis (HCC) 08/19/2021   Arthritis    Asthma    Bipolar 1 disorder (HCC)    Chronic diastolic CHF (congestive heart failure) (HCC)    Class 3 severe obesity with serious comorbidity and body mass index (BMI) greater than or equal to 70 in adult [facility]) 12/21/2019   Depression    Diastolic dysfunction 08/02/2021   Diverticulosis 08/19/2021   Edema of both lower extremities    Hepatic steatosis 08/19/2021   Hepatomegaly 08/19/2021   High blood pressure    Hyperlipidemia    Joint pain    Morbid obesity (HCC)    Pre-diabetes    Past Surgical History:  Procedure Laterality Date   APPLICATION OF WOUND VAC Left 12/20/2021   Procedure: APPLICATION OF WOUND VAC;  Surgeon:  Nadara Mustard, MD;  Location: [facility] OR;  Service: Orthopedics;  Laterality: Left;   I & D EXTREMITY Left 12/20/2021   Procedure: EXCISIONAL DEBRIDEMENT HEMATOMA LEFT LEG;  Surgeon: Nadara Mustard, MD;  Location: [facility] OR;  Service: Orthopedics;  Laterality: Left;   NO PAST SURGERIES     RIGHT/LEFT HEART CATH AND CORONARY ANGIOGRAPHY N/A 10/06/2021   Procedure: RIGHT/LEFT HEART CATH AND CORONARY ANGIOGRAPHY;  Surgeon: Corky Crafts, MD;  Location: [facility] INVASIVE CV LAB;  Service: Cardiovascular;  Laterality: N/A;   Allergies  Allergen Reactions   Aspirin Nausea And Vomiting   Egg-Derived Products Swelling   Influenza Vaccines Swelling      Objective:    Physical Exam Vitals and nursing note reviewed.  Constitutional:      Appearance: Normal appearance. She is morbidly obese.  Cardiovascular:     Rate and Rhythm: Normal rate and regular rhythm.  Pulmonary:     Effort: Pulmonary effort is normal.     Breath  sounds: Normal breath sounds.  Musculoskeletal:        General: Normal range of motion.     Right lower leg: 1+ Edema present.     Left lower leg: 1+ Edema present.  Skin:    General: Skin is warm and dry.  Neurological:     Mental Status: She is alert.  Psychiatric:        Mood and Affect: Mood normal.        Behavior: Behavior normal.    BP (!) 148/86 (BP Location: Left Arm, Patient Position: Sitting, Cuff Size: Large)   Pulse 86   Temp 97.9 F (36.6 C) (Temporal)   Ht 5' (1.524 m)   Wt (!) 375 lb 12.8 oz (170.5 kg)   SpO2 97%   BMI 73.39 kg/m  Wt Readings from Last 3 Encounters:  11/15/23 (!) 375 lb 12.8 oz (170.5 kg)  10/21/23 (!) 392 lb 9.6 oz (178.1 kg)  10/10/23 (!) 382 lb 3.2 oz (173.4 kg)   *Extra time ( ) spent with patient today due to complexity of symptoms/concerns, which consisted of chart review, discussing diagnoses, work up, treatment, answering questions, and documentation.     Dulce Sellar, NP

## 2023-11-16 LAB — COMPLETE METABOLIC PANEL WITHOUT GFR
AG Ratio: 1.3 (calc) (ref 1.0–2.5)
ALT: 12 U/L (ref 6–29)
AST: 16 U/L (ref 10–35)
Albumin: 4.3 g/dL (ref 3.6–5.1)
Alkaline phosphatase (APISO): 118 U/L (ref 37–153)
BUN: 12 mg/dL (ref 7–25)
CO2: 26 mmol/L (ref 20–32)
Calcium: 9.2 mg/dL (ref 8.6–10.4)
Chloride: 98 mmol/L (ref 98–110)
Creat: 0.79 mg/dL (ref 0.50–1.03)
Globulin: 3.2 g/dL (ref 1.9–3.7)
Glucose, Bld: 84 mg/dL (ref 65–99)
Potassium: 4.6 mmol/L (ref 3.5–5.3)
Sodium: 136 mmol/L (ref 135–146)
Total Bilirubin: 0.4 mg/dL (ref 0.2–1.2)
Total Protein: 7.5 g/dL (ref 6.1–8.1)

## 2023-11-18 ENCOUNTER — Ambulatory Visit: Admitting: Occupational Therapy

## 2023-11-18 ENCOUNTER — Encounter: Payer: Self-pay | Admitting: Family

## 2023-11-20 ENCOUNTER — Other Ambulatory Visit: Payer: Self-pay

## 2023-11-20 NOTE — Patient Outreach (Signed)
 Complex Care Management   Visit Note  11/20/2023  Name:  Christina Dominguez MRN: [id number] DOB: 04-18-1966  Situation: Referral received for Complex Care Management related to SDOH Barriers:  PCS services  I obtained verbal consent from Patient.  Visit completed with patient  on the phone  Background:   Past Medical History:  Diagnosis Date   Anxiety    Aortic atherosclerosis (HCC) 08/19/2021   Arthritis    Asthma    Bipolar 1 disorder (HCC)    Chronic diastolic CHF (congestive heart failure) (HCC)    Class 3 severe obesity with serious comorbidity and body mass index (BMI) greater than or equal to 70 in adult [facility]) 12/21/2019   Depression    Diastolic dysfunction 08/02/2021   Diverticulosis 08/19/2021   Edema of both lower extremities    Hepatic steatosis 08/19/2021   Hepatomegaly 08/19/2021   High blood pressure    Hyperlipidemia    Joint pain    Morbid obesity (HCC)    Pre-diabetes     Assessment: Patient Reported Symptoms:  Cognitive        Neurological      HEENT        Cardiovascular      Respiratory      Endocrine      Gastrointestinal        Genitourinary      Integumentary      Musculoskeletal     Falls in the past year?: Yes Number of falls in past year: 1 or less Was there an injury with Fall?: No Fall Risk Category Calculator: 1 Patient Fall Risk Level: Low Fall Risk Patient at Risk for Falls Due to: Impaired balance/gait, Impaired mobility  Psychosocial       Quality of Family Relationships: involved Do you feel physically threatened by others?: No      08/30/2021    4:54 PM  Depression screen PHQ 2/9  Decreased Interest 0  Down, Depressed, Hopeless 0  PHQ - 2 Score 0  Altered sleeping 3  Tired, decreased energy 3  Change in appetite 3  Feeling bad or failure about yourself  0  Trouble concentrating 0  Moving slowly or fidgety/restless 3  Suicidal thoughts 0  PHQ-9 Score 12  Difficult doing work/chores Somewhat difficult     There were no vitals filed for this visit.  Medications Reviewed Today   Medications were not reviewed in this encounter     Recommendation:   SW will resend [facility] request to [facility] for approval.  Follow Up Plan:   Telephone follow up appointment date/time:  12/11/23 at 1pm.  Dallis Dues, BSW [place]  [facility], [facility] Social Worker Direct Dial: [phone]  Fax: [phone] Website: [email or web address]

## 2023-11-21 ENCOUNTER — Ambulatory Visit

## 2023-11-21 DIAGNOSIS — M6281 Muscle weakness (generalized): Secondary | ICD-10-CM

## 2023-11-21 DIAGNOSIS — R269 Unspecified abnormalities of gait and mobility: Secondary | ICD-10-CM

## 2023-11-21 DIAGNOSIS — R2681 Unsteadiness on feet: Secondary | ICD-10-CM

## 2023-11-21 DIAGNOSIS — Z7409 Other reduced mobility: Secondary | ICD-10-CM

## 2023-11-21 DIAGNOSIS — R5381 Other malaise: Secondary | ICD-10-CM

## 2023-11-21 DIAGNOSIS — R2689 Other abnormalities of gait and mobility: Secondary | ICD-10-CM

## 2023-11-21 NOTE — Patient Instructions (Signed)
 Visit Information  Thank you for taking time to visit with me today. Please don't hesitate to contact me if I can be of assistance to you before our next scheduled appointment.  Your next care management appointment is by telephone on 12/11/23 at 1pm   Please call the care guide team at [phone] if you need to cancel, schedule, or reschedule an appointment.   Please call 911 if you are experiencing a Mental Health or Behavioral Health Crisis or need someone to talk to.  Dallis Dues, BSW [place]  [facility], [facility] Social Worker Direct Dial: [phone]  Fax: [phone] Website: [email or web address]

## 2023-11-21 NOTE — Therapy (Signed)
 OUTPATIENT PHYSICAL THERAPY LOWER EXTREMITY TREATMENT   Patient Name: Christina Dominguez MRN: [id number] DOB:July 29, 1966, 58 y.o., female Today's Date: 11/21/2023  END OF SESSION:  PT End of Session - 11/21/23 1503     Visit Number 12    Date for PT Re-Evaluation 12/03/23    Authorization Type [place] [employer]    PT Start Time 1503    PT Stop Time 1545    PT Time Calculation (min) 42 min    Activity Tolerance Patient tolerated treatment well    Behavior During Therapy [facility] for tasks assessed/performed              Past Medical History:  Diagnosis Date   Anxiety    Aortic atherosclerosis (HCC) 08/19/2021   Arthritis    Asthma    Bipolar 1 disorder (HCC)    Chronic diastolic CHF (congestive heart failure) (HCC)    Class 3 severe obesity with serious comorbidity and body mass index (BMI) greater than or equal to 70 in adult [facility]) 12/21/2019   Depression    Diastolic dysfunction 08/02/2021   Diverticulosis 08/19/2021   Edema of both lower extremities    Hepatic steatosis 08/19/2021   Hepatomegaly 08/19/2021   High blood pressure    Hyperlipidemia    Joint pain    Morbid obesity (HCC)    Pre-diabetes    Past Surgical History:  Procedure Laterality Date   APPLICATION OF WOUND VAC Left 12/20/2021   Procedure: APPLICATION OF WOUND VAC;  Surgeon: Nadara Mustard, MD;  Location: [facility] OR;  Service: Orthopedics;  Laterality: Left;   I & D EXTREMITY Left 12/20/2021   Procedure: EXCISIONAL DEBRIDEMENT HEMATOMA LEFT LEG;  Surgeon: Nadara Mustard, MD;  Location: [facility] OR;  Service: Orthopedics;  Laterality: Left;   NO PAST SURGERIES     RIGHT/LEFT HEART CATH AND CORONARY ANGIOGRAPHY N/A 10/06/2021   Procedure: RIGHT/LEFT HEART CATH AND CORONARY ANGIOGRAPHY;  Surgeon: Corky Crafts, MD;  Location: [facility] INVASIVE CV LAB;  Service: Cardiovascular;  Laterality: N/A;   Patient Active Problem List   Diagnosis Date Noted   Hyperlipidemia 08/30/2023   Class 3 obesity 07/30/2023   Impaired  mobility and ADLs 06/30/2023   Lumbar radiculopathy 06/30/2023   Lymphedema 06/30/2023   Left leg cellulitis 06/20/2023   Cellulitis 06/19/2023   Cellulitis of lower extremity 06/18/2023   Transaminitis 06/06/2023   Chest pain 06/05/2023   Cellulitis of left lower extremity 06/04/2023   Nocturnal hypoxemia 07/03/2022   Arthritis 04/16/2022   Asthma 04/16/2022   Hematoma of left lower leg    Wheezing 11/23/2021   Snoring 11/23/2021   Other secondary pulmonary hypertension (HCC) 11/23/2021   Hyponatremia 10/11/2021   Dyslipidemia 10/04/2021   Mediastinal mass 10/04/2021   Obstructive sleep apnea 08/25/2021   RUQ pain 08/19/2021   Hepatic steatosis 08/19/2021   Hepatomegaly 08/19/2021   Aortic atherosclerosis (HCC) 08/19/2021   Diverticulosis 08/19/2021   Anxiety 08/02/2021   Bipolar 1 disorder (HCC) 08/02/2021   Chronic diastolic CHF (congestive heart failure) (HCC) 08/02/2021   Depression 08/02/2021   Joint pain 08/02/2021   Screening for malignant neoplasm of colon 08/02/2021   Diastolic dysfunction 08/02/2021   Heart failure, unspecified (HCC) 08/02/2021   Prediabetes 12/22/2019   Vitamin D insufficiency 12/22/2019   Class 3 severe obesity with serious comorbidity and body mass index (BMI) greater than or equal to 60 in adult [facility]) 12/21/2019   Essential hypertension 04/30/2019   Mild intermittent asthma without complication 04/30/2019  PCP: Dr. Marylyn Sofia  REFERRING PROVIDER: Dr. Fernande Howells  REFERRING DIAG: Weakness  THERAPY DIAG:  Muscle weakness (generalized)  Other abnormalities of gait and mobility  Unsteadiness on feet  Physical deconditioning  Impaired functional mobility and endurance  Abnormal gait  Rationale for Evaluation and Treatment: Rehabilitation  ONSET DATE: 06/08/23 - referral date  SUBJECTIVE:   SUBJECTIVE STATEMENT:  Swelling in feet has gone down so I can put shoes on.    PERTINENT HISTORY: Aortic  atherosclerosis Bipolar CHF Severe Obesity Bilateral LE Edema Hypertension Hyperlipidemia Prediabetes Asthma arthritis PSH: heart cath, L leg excisional debridement hematoma  PAIN:  Are you having pain? Yes: NPRS scale: 3/10  Pain location: Knees R ankle  Pain description: aching  Aggravating factors: prolonged standing Relieving factors: [place] Described feeling pain in knees and ankles generally  PRECAUTIONS: Fall  RED FLAGS: None   WEIGHT BEARING RESTRICTIONS: No  FALLS:  Has patient fallen in last 6 months? 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  DIAGNOSTIC FINDINGS: none recent   COGNITION: Overall cognitive status: Within functional limits for tasks assessed     SENSATION: WFL  POSTURE: rounded shoulders, increased lumbar lordosis, anterior pelvic tilt, and flexed trunk   LOWER EXTREMITY ROM: resting pain 4/10, 8/10 with movements  Active ROM Right eval Left eval  Hip flexion [facility]  Hip extension    Hip abduction    Hip adduction    Hip internal rotation    Hip external rotation    Knee flexion [facility]  Knee extension [facility]  Ankle dorsiflexion [facility]  Ankle plantarflexion [facility]  Ankle inversion    Ankle eversion     (Blank rows = not tested)  LOWER EXTREMITY MMT:  MMT Right eval Left eval Right 10/08/23 Left 10/08/23  Hip flexion 4-* 3+* 4 4  Hip extension      Hip abduction 4* 4* 4+ 4+  Hip adduction 4* 4*    Hip internal rotation      Hip external rotation      Knee flexion   4 4-  Knee  extension 3+* 3+* 4 4  Ankle dorsiflexion 5 5    Ankle plantarflexion      Ankle inversion      Ankle eversion       (Blank rows = not tested)  FUNCTIONAL TESTS:  30 seconds chair stand test Timed up and go (TUG): 26.37 30 second STS: 4    GAIT: Distance walked: in clinic distances Assistive device utilized: Walker - 2 wheeled Level of assistance: Modified independence  TREATMENT DATE:  11/21/23 Walking 3 laps no break Side steps in bars  Hip abd in bars 2x10 Step ups on airex in bars x5 each side  NuStep L5x63mins    11/05/23 Walk 3 laps no breaks  NuStep L4x9mins  4" taps by stairs Side steps on to airex by stairs 1x across Seated yellow ball twists 2x10 Seated yellow ball OHP 2x10   10/31/23 Recheck goals  LAQ 3# 2x10 with 3s hold Standing marches 3# with walker  Seated heel to toe raises 3# 2x12 Seated chest press to shoulder flexion 3# 2x10   10/24/23  Nustep L5x6 minutes all four extremities Fitter 2 blue bands 1x15 B  LAQs 2.5# B x10 Seated marches 2.5# B x10 (alternating) Walking laps with 2.5# each LE and RW 167ft    10/22/23 NuStep L5x40mins  Fitter pushes 2x10 1 blue and 1 black band  Seated marches 2# 2x10 Seated 4" box taps laterally 2# 2x10 Seated horizontal abd red 2x10 Chest press yellow ball 2x10  OHP yellow ball 2x10   10/15/23 Walking laps with 2# weights  Marching 2# 2x10 LAQ 2# 2x10 Seated calf raises 2x12 Seated toe raises 2x12 Seated rows red 2x10 HS curls red 2x10 Mini squats x10   10/08/23  MMT update (see above)  Nustep L4x6 minutes (breaks every 2 minutes) Gait x3 laps with RW (untimed, no breaks) 2 lap walk walk with 1# each leg   LAQ 1# x10 B alternating Chest press yellow ball x12 Seated marches 1# x10 B alternating  OH press 2# each UE x10 B alternating   Sitting UE ABD 2# each UE x12        PATIENT  EDUCATION:  Education details: POC and HEP Person educated: Patient Education method: Medical illustrator Education comprehension: verbalized understanding and returned demonstration  HOME EXERCISE PROGRAM: Access Code: [email or web address] URL: [email or web address] Date: 09/10/2023 Prepared by: Donavon Fudge Exercises  - Seated March  - 1 x daily - 7 x weekly - 2 sets - 10 reps  - Seated Long Arc Quad  - 1 x daily - 7 x weekly - 2 sets - 10 reps  - Seated Hip Adduction Isometrics with Ball  - 1 x daily - 7 x weekly - 2 sets - 10 reps  - Seated Heel Raise  - 1 x daily - 7 x weekly - 2 sets - 10 reps   ASSESSMENT:  CLINICAL IMPRESSION: Continued working on functional strengthening and activity tolerance as tolerated. She does not bend her knees with step ups on airex, circumducts both legs. Was cued to try to bend the knee. This was painful for her but she reports the pain was less with each rep. Will continue to progress challenges as able/tolerated. Wants to do 1 more visit before wrapping up and saving the rest of her appts for later in the year.    OBJECTIVE IMPAIRMENTS: Abnormal gait, cardiopulmonary status limiting activity, decreased activity tolerance, decreased balance, decreased endurance, difficulty walking, decreased strength, improper body mechanics, postural dysfunction, obesity, and pain.   ACTIVITY LIMITATIONS: carrying, lifting, bending, sitting, standing, squatting, and stairs  PARTICIPATION LIMITATIONS: meal prep, cleaning, laundry, driving, shopping, community activity, occupation, and yard work  PERSONAL FACTORS: Age, Behavior pattern, Education, Fitness, and Time since onset of injury/illness/exacerbation are also affecting patient's functional outcome.   REHAB POTENTIAL: Good  CLINICAL DECISION MAKING: Stable/uncomplicated  EVALUATION COMPLEXITY: Low   GOALS: Goals reviewed with patient? Yes  SHORT TERM GOALS: Target date: 10/22/23  Patient  will  be independent with initial HEP. Baseline: given 09/10/23 Goal status: MET 10/31/23   LONG TERM GOALS: Target date:12/03/23  Patient will be independent with advanced/ongoing HEP to improve outcomes and carryover.  Baseline:  Goal status: INITIAL  2.  Patient will report at least 75% improvement in bilateral LE pain to improve QOL. Baseline: 4/10 resting, 8/10 with movement (09/10/23) Goal status: IN PROGRESS maybe 50% 10/31/23  3.  Patient will increase standing tolerance to 10 mins or longer to be able to complete household chores. Baseline: unable to do Goal status: MET 10/31/23  4.  Patient will be able to ambulate 200' with LRAD and normal gait pattern without increased pain to access community.  Baseline: 3 laps with walker 10/08/23 Goal status: REVISED 459ft   5.  Patient will perform 7 STS in 30 seconds Baseline: 4 (09/10/23) Goal status: IN PROGRESS 5 reps 10/31/23   PLAN:  PT FREQUENCY: 2x/week  PT DURATION: 12 weeks  PLANNED INTERVENTIONS: 97110-Therapeutic exercises, 97530- Therapeutic activity, 97112- Neuromuscular re-education, 97535- Self Care, 16109- Manual therapy, [id number]- Gait training, [phone]- Aquatic Therapy, Stair training, DME instructions, Cryotherapy, and Moist heat  PLAN FOR NEXT SESSION: bilateral LE strengthening, improve muscular and cardiovascular endurance, progressions as tolerated   Donavon Fudge PT, DPT 11/21/23 3:44 PM

## 2023-11-25 ENCOUNTER — Encounter (HOSPITAL_BASED_OUTPATIENT_CLINIC_OR_DEPARTMENT_OTHER): Payer: Self-pay | Admitting: Pulmonary Disease

## 2023-11-25 ENCOUNTER — Ambulatory Visit (HOSPITAL_BASED_OUTPATIENT_CLINIC_OR_DEPARTMENT_OTHER): Payer: Medicaid Other | Admitting: Pulmonary Disease

## 2023-11-25 VITALS — BP 126/84 | HR 86 | Ht 60.0 in | Wt 375.8 lb

## 2023-11-25 DIAGNOSIS — I272 Pulmonary hypertension, unspecified: Secondary | ICD-10-CM | POA: Diagnosis not present

## 2023-11-25 DIAGNOSIS — J4520 Mild intermittent asthma, uncomplicated: Secondary | ICD-10-CM

## 2023-11-25 DIAGNOSIS — G4733 Obstructive sleep apnea (adult) (pediatric): Secondary | ICD-10-CM | POA: Diagnosis not present

## 2023-11-25 DIAGNOSIS — G4761 Periodic limb movement disorder: Secondary | ICD-10-CM

## 2023-11-25 NOTE — Patient Instructions (Addendum)
  Mild OSA  PLMS Pulmonary hypertension --Continue autoCPAP 5-15 cm H20 --START melatonin 5 mg nightly. Will refer to Sleep physician at [facility] Pulmonary --CANCEL oxygen  order   Intermittent asthma - asymptomatic --CONTINUE Albuterol  as needed

## 2023-11-25 NOTE — Progress Notes (Unsigned)
 Subjective:   PATIENT ID: Christina Dominguez GENDER: female DOB: 1966-07-21, MRN: [id number]   HPI  Chief Complaint  Patient presents with   Follow-up    Reason for Visit: F/u  Ms. Christina Dominguez is a 58 year old female never smoker with morbid obesity, clinical asthma, hx of chronic bronchitis (no recent issues in 6 years), HTN, HLD, moderate pulmonary hypertension, chronic diastolic heart failure, OSA who presents for follow-up for concern for OSA.  Initial consult She was incidentally diagnosed with COVID in March 2023 during her hospitalization for heart failure. Since then she reports her shortness of breath and wheezing has remained. She has been more active with water aerobics and changed her diet. Wheezing at night has resolved.   While in the hospital she reports episodes of witnessed apnea by staff and was started on CPAP. Tolerated it well and reported improved sleep quality. At home she would wake up every two hours due to nocturia. Now has fatigue off the machine.  01/30/22 She reports developing left lower extremity edema in the last 24 hours. She reports debridement of left leg hematoma on 12/20/21 on the same limb. She was walking however last week had GI illness and was not as ambulatory. Since starting her oxygen , her quality of sleep is improved however she had better control on CPAP while in the hospital. This helps with her activity level and energy. She has some wheezing a few times a week. Occasional cough. Shortness of breath with moderate exertion. Does not limit activity.  07/03/22 Since our last visit she reports her breathing has improved. Has rarely needed her albuterol . Denies wheezing, cough. Shortness of breath with exertion but attributes to her inactivity after surgery. Wears oxygen  nightly and feels this helps. She continues to have nocturia so unable to stay asleep. Wakes up every few hours. Has not been contacted about CPAP supplies.  08/27/23 Since  our last visit it has been >1 year. She has been able to obtain CPAP due to insurance denials over the year. She reports she is sleepier and more fatigued. She has nocturnal awakenings and nocturia every 2-3 hours. She is not getting good quality sleep. She reports shortness of breath has improved and not on maintenance inhalers. She has had less swelling since being on lasix  80 mg daily and this has improved her breathing. Has lost 15 lbs in the the last two months  11/25/23 Since our last visit she received CPAP machine on 11/05/23-11/24/23. She reports her sleeping hasn't improved. She usually goes to bed at 2 pm. Trying to go to bed at 10 pm but unable to fall asleep. Tried sleepy time tea, previously on Zzquil. 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 >10 years ago.   Past Medical History:  Diagnosis Date   Anxiety    Aortic atherosclerosis (HCC) 08/19/2021   Arthritis    Asthma    Bipolar 1 disorder (HCC)    Chronic diastolic CHF (congestive heart failure) (HCC)    Class 3 severe obesity with serious comorbidity and body mass index (BMI) greater than or equal to 70 in adult [facility]) 12/21/2019   Depression    Diastolic dysfunction 08/02/2021   Diverticulosis 08/19/2021   Edema of both lower extremities    Hepatic steatosis 08/19/2021   Hepatomegaly 08/19/2021   High blood pressure    Hyperlipidemia    Joint pain    Morbid obesity (  HCC)    Pre-diabetes      Family History  Problem Relation Age of Onset   Heart Problems Mother        Broken heart syndrome   Hypertension Mother    Hypercholesterolemia Mother    Bone cancer Father    Testicular cancer Father    Heart attack Sister    Diabetes Brother    Colon cancer Maternal Grandfather    Dementia Paternal Grandmother      Social History   Occupational History   Occupation: Advertising account planner, work from home  Tobacco Use   Smoking status: Never     Passive exposure: Never   Smokeless tobacco: Never  Vaping Use   Vaping status: Never Used  Substance and Sexual Activity   Alcohol use: Not Currently    Comment: Maybe 2 glasses of wine a month   Drug use: Never   Sexual activity: Not on file    Allergies  Allergen Reactions   Aspirin Nausea And Vomiting   Egg-Derived Products Swelling   Influenza Vaccines Swelling     Outpatient Medications Prior to Visit  Medication Sig Dispense Refill   acetaminophen  (TYLENOL ) 650 MG CR tablet Take 1,300 mg by mouth every 8 (eight) hours as needed for pain.     albuterol  (VENTOLIN  HFA) 108 (90 Base) MCG/ACT inhaler Inhale 2 puffs into the lungs every 6 (six) hours as needed for wheezing or shortness of breath. 8 g 6   atorvastatin  (LIPITOR) 40 MG tablet Take 1 tablet (40 mg total) by mouth daily. 90 tablet 1   cyanocobalamin  (VITAMIN B12) 100 MCG tablet Take 100 mcg by mouth daily.     dicyclomine  (BENTYL ) 10 MG capsule Take 1 capsule (10 mg total) by mouth 4 (four) times daily -  before meals and at bedtime. (Patient taking differently: Take 10 mg by mouth 4 (four) times daily -  before meals and at bedtime. Pt admits to only taking one time per day) 120 capsule 2   diphenhydrAMINE  HCl, Sleep, (ZZZQUIL PO) Take 3 tablets by mouth at bedtime.     furosemide  (LASIX ) 80 MG tablet Take 1 tablet (80 mg total) by mouth daily. 90 tablet 1   potassium chloride  SA (KLOR-CON  M) 20 MEQ tablet Take 1 tablet (20 mEq total) by mouth daily. 90 tablet 1   valsartan  (DIOVAN ) 40 MG tablet Take 1 tablet (40 mg total) by mouth daily. 90 tablet 1   Vitamin D , Ergocalciferol , (DRISDOL ) 1.25 MG (50000 UNIT) CAPS capsule Take 1 capsule (50,000 Units total) by mouth every 7 (seven) days. 12 capsule 0   WEGOVY 0.25 MG/0.5ML SOAJ Inject into the skin.     famotidine (PEPCID) 20 MG tablet Take by mouth.     No facility-administered medications prior to visit.    Review of Systems  Constitutional:  Positive for  malaise/fatigue. Negative for chills, diaphoresis, fever and weight loss.  HENT:  Negative for congestion.   Respiratory:  Positive for shortness of breath. Negative for cough, hemoptysis, sputum production and wheezing.   Cardiovascular:  Negative for chest pain, palpitations and leg swelling.     Objective:   Vitals:   11/25/23 1533  BP: 126/84  Pulse: 86  SpO2: 97%  Weight: (!) 375 lb 12.8 oz (170.5 kg)  Height: 5' (1.524 m)    SpO2: 97 %  Physical Exam: General: Well-appearing, no acute distress HENT: [place], AT Eyes: EOMI, no scleral icterus Respiratory: Clear to auscultation bilaterally.  No crackles, wheezing  or rales Cardiovascular: RRR, -M/R/G, no JVD Extremities:-Edema,-tenderness Neuro: AAO x4, CNII-XII grossly intact Psych: Normal mood, normal affect  Data Reviewed:  Imaging: CTA 10/03/21 - No PE. Dilation of main pulmonary artery suggestive of PH. 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 Pulmonary artery enlargement. CXR 10/09/2021-bibasilar atelectasis CTA 06/18/23 - Neg for PE, bilateral mosaic attenuation, right paraspinal posterior mediastinal mass 3 x 2.1 cm, previously 4.5 x 3 cm characterized as benign cystic lesion on MRI 10/05/21  PFT: 01/30/22 FVC 1.24 (42%) FEV1 1.02 (44%) Ratio 75  DLCO 70% Interpretation: Severe obstructive defect based on FEV1/SVC. Partial bronchodilator response however not significant. This does not preclude the benefit of bronchodilators. Mildly reduced DLCO.   Labs: CBC    Component Value Date/Time   WBC 7.4 10/21/2023 1505   RBC 4.05 10/21/2023 1505   HGB 12.3 10/21/2023 1505   HCT 37.3 10/21/2023 1505   PLT 348.0 10/21/2023 1505   MCV 92.2 10/21/2023 1505   MCH 28.9 06/21/2023 0521   MCHC 32.9  10/21/2023 1505   RDW 14.8 10/21/2023 1505   LYMPHSABS 1.0 10/21/2023 1505   MONOABS 0.4 10/21/2023 1505   EOSABS 0.2 10/21/2023 1505   BASOSABS 0.0 10/21/2023 1505   Absolute eos 10/03/21 - 200  Sleep: Split night 12/28/21 - Mild OSA and PLMS with AHI 7.7 Nadir SpO2 75%. Recommend therapy including CPAP, oral appliance, or surgical assessment.  HST 09/24/23 AHI 6.1/h Nadir SpO2 69% Interpretation: Mild OSA with oxygen  desaturation  CPAP compliance 11/05/23-11/24/23 Usage days 16/20 days (80%) >4 hours 14/20 days (70%) Average hours 6 hours 52 min    Assessment & Plan:   Discussion: 58 year old female never smoker with morbid obesity, asthma, hx of chronic bronchitis, HTN, HLD, moderate PH secondary to OSA, chronic diastolic heart failure who presents for follow-up. Compliant with CPAP since she received it 16 days ago. Continues to have severe insomnia that interferes with her quality of sleep.  Mild OSA  PLMS Pulmonary hypertension Patient uses NIV for more than four hours nightly for at least 70% of nights during the last 16 of usage. The patient has been using and benefiting from PAP use and will continue to benefit from therapy.  --Continue autoCPAP 5-15 cm H20 --START melatonin 5 mg nightly. Will refer to Sleep physician at [facility] to take over care --CANCEL oxygen  order  --Recommend transferring care to sleep physician at this point  Intermittent asthma - asymptomatic --CONTINUE Albuterol  as needed  Health Maintenance Immunization History  Administered Date(s) Administered   Janssen (J&J) SARS-COV-2 Vaccination 11/16/2019   Moderna Sars-Covid-2 Vaccination 08/11/2020   PNEUMOCOCCAL CONJUGATE-20 06/21/2023   Tdap 04/01/2019   Zoster Recombinant(Shingrix ) 10/26/2021, 11/28/2023    Orders Placed This Encounter  Procedures   Ambulatory referral to Pulmonology    Referral Priority:   Routine    Referral Type:   Consultation    Referral Reason:   Specialty  Services Required    Requested Specialty:   Pulmonary Disease    Number of Visits Requested:   1   No orders of the defined types were placed in this encounter.   Return for Sleep physician.   I have spent a total time of 31-minutes on the day of the appointment including chart review, data review, collecting history,  coordinating care and discussing medical diagnosis and plan with the patient/family. Past medical history, allergies, medications were reviewed. Pertinent imaging, labs and tests included in this note have been reviewed and interpreted independently by me.  Naria Abbey Genetta Kenning, MD [facility]

## 2023-11-28 ENCOUNTER — Encounter (HOSPITAL_BASED_OUTPATIENT_CLINIC_OR_DEPARTMENT_OTHER): Payer: Self-pay | Admitting: Pulmonary Disease

## 2023-11-28 ENCOUNTER — Ambulatory Visit: Admitting: Physician Assistant

## 2023-11-28 ENCOUNTER — Encounter: Payer: Self-pay | Admitting: Physician Assistant

## 2023-11-28 VITALS — BP 144/76 | HR 62 | Temp 97.4°F | Ht 60.0 in | Wt 374.6 lb

## 2023-11-28 DIAGNOSIS — Z01110 Encounter for hearing examination following failed hearing screening: Secondary | ICD-10-CM | POA: Diagnosis not present

## 2023-11-28 DIAGNOSIS — I10 Essential (primary) hypertension: Secondary | ICD-10-CM

## 2023-11-28 DIAGNOSIS — G4733 Obstructive sleep apnea (adult) (pediatric): Secondary | ICD-10-CM | POA: Diagnosis not present

## 2023-11-28 DIAGNOSIS — N39498 Other specified urinary incontinence: Secondary | ICD-10-CM

## 2023-11-28 DIAGNOSIS — Z23 Encounter for immunization: Secondary | ICD-10-CM | POA: Diagnosis not present

## 2023-11-28 NOTE — Progress Notes (Addendum)
 Patient ID: Christina Dominguez, female    DOB: 04/18/1966, 58 y.o.   MRN: [id number]   Assessment & Plan:  Encounter for hearing examination after failed hearing screening -     Ambulatory referral to Audiology  Need for shingles vaccine -     Varicella-zoster vaccine IM    Assessment & Plan Hypertension Blood pressure remains elevated despite instructions to take an extra dose of valsartan  if readings exceed 145/90 mmHg. She has not taken the extra dose as symptoms improved after dietary adjustments and resolution of flu-like symptoms. - Recheck blood pressure during the visit - Consider taking an extra dose of valsartan  if blood pressure exceeds 145/90 mmHg  Insomnia Reports improved sleep with melatonin 5 mg, achieving over 8 hours of sleep with some interruptions. CPAP machine use is ongoing, and pulmonologist advised that adjustment period is needed. - Continue melatonin 5 mg as needed for sleep - Continue using CPAP machine  Hearing loss, unspecified Reports decreased hearing in the left ear despite clear ear canals. Audiometry test indicated hearing loss in both ears. Referral to audiometry is planned for further evaluation. She is advised to continue using Debrox drops and consider a hearing assessment at [facility] for cost-effective options. - Refer to audiometry for further evaluation of hearing loss - Continue using Debrox drops - Consider hearing assessment at [facility]  Urinary incontinence Per patient - The incontinence started March or April of 2020, not a big issue at the time due to not leaving house often. Now that my other health issues are improving I have begun to leave the house more often. As a result, the incontinence has become a real problem. I have tried panty liners and pads in the past they are not sufficient. I believe I need adult diapers size 5x. - Will try to send Rx to cover for urinary incontinence supplies       Return in about 4 months (around  03/29/2024) for recheck/follow-up.    Subjective:    Chief Complaint  Patient presents with   Medical Management of Chronic Issues    Pt in office for 3 wk follow up; seen [facility] 11/15/23 for HTN; pt c/o ear pain BIL ears, left is the worst; been using OTC Debrox;     HPI Discussed the use of AI scribe software for clinical note transcription with the patient, who gave verbal consent to proceed.  History of Present Illness Christina Dominguez is a 58 year old female who presents with concerns about hearing changes and insomnia.  She has been experiencing hearing changes, particularly in her left ear, described as 'piercing' and more uncomfortable than the right ear. She has been using Debrox drops at home to manage earwax, noting improvement in the right ear, but the left ear remains uncomfortable. Sounds seem louder than usual, which she noticed during a recent visit to the [employer]. Her grandmother also has hearing problems.  She has been experiencing insomnia, which has worsened despite recently starting to use a CPAP machine. After taking 5 mg of melatonin, she managed to get over eight hours of sleep, although it was fragmented, requiring ten and a half hours in total to achieve this.  She mentions a recent episode of flu-like symptoms that began about a week after starting [facility]. These symptoms have since resolved. She has not experienced nausea or constipation, which were anticipated side effects of Wegovy. Her bowel movements have become more regular, occurring about three times a day, typically  an hour after eating.  She is concerned about her blood pressure, which was elevated during a previous visit. She has not needed to take an extra dose of valsartan  as her symptoms improved after increasing her caloric intake from 1200 to 1600 calories per day. This adjustment has also slowed her weight loss from five pounds per week to about two pounds per week.  She is currently on  Wegovy, having completed the 1.5 mg dose and is scheduled to start the 1.7 mg dose today. She is also on valsartan  for blood pressure management.     Past Medical History:  Diagnosis Date   Anxiety    Aortic atherosclerosis (HCC) 08/19/2021   Arthritis    Asthma    Bipolar 1 disorder (HCC)    Chronic diastolic CHF (congestive heart failure) (HCC)    Class 3 severe obesity with serious comorbidity and body mass index (BMI) greater than or equal to 70 in adult [facility]) 12/21/2019   Depression    Diastolic dysfunction 08/02/2021   Diverticulosis 08/19/2021   Edema of both lower extremities    Hepatic steatosis 08/19/2021   Hepatomegaly 08/19/2021   High blood pressure    Hyperlipidemia    Joint pain    Morbid obesity (HCC)    Pre-diabetes     Past Surgical History:  Procedure Laterality Date   APPLICATION OF WOUND VAC Left 12/20/2021   Procedure: APPLICATION OF WOUND VAC;  Surgeon: Harden Jerona GAILS, MD;  Location: [facility] OR;  Service: Orthopedics;  Laterality: Left;   I & D EXTREMITY Left 12/20/2021   Procedure: EXCISIONAL DEBRIDEMENT HEMATOMA LEFT LEG;  Surgeon: Harden Jerona GAILS, MD;  Location: [facility] OR;  Service: Orthopedics;  Laterality: Left;   NO PAST SURGERIES     RIGHT/LEFT HEART CATH AND CORONARY ANGIOGRAPHY N/A 10/06/2021   Procedure: RIGHT/LEFT HEART CATH AND CORONARY ANGIOGRAPHY;  Surgeon: Dann Candyce RAMAN, MD;  Location: [facility] INVASIVE CV LAB;  Service: Cardiovascular;  Laterality: N/A;    Family History  Problem Relation Age of Onset   Heart Problems Mother        Broken heart syndrome   Hypertension Mother    Hypercholesterolemia Mother    Bone cancer Father    Testicular cancer Father    Heart attack Sister    Diabetes Brother    Colon cancer Maternal Grandfather    Dementia Paternal Grandmother     Social History   Tobacco Use   Smoking status: Never    Passive exposure: Never   Smokeless tobacco: Never  Vaping Use   Vaping status: Never Used  Substance Use  Topics   Alcohol use: Not Currently    Comment: Maybe 2 glasses of wine a month   Drug use: Never     Allergies  Allergen Reactions   Aspirin Nausea And Vomiting   Egg-Derived Products Swelling   Influenza Vaccines Swelling    Review of Systems NEGATIVE UNLESS OTHERWISE INDICATED IN HPI      Objective:     BP (!) 144/76 (BP Location: Left Arm, Patient Position: Sitting, Cuff Size: Normal)   Pulse 62   Temp (!) 97.4 F (36.3 C) (Temporal)   Ht 5' (1.524 m)   Wt (!) 374 lb 9.6 oz (169.9 kg)   SpO2 96%   BMI 73.16 kg/m   Wt Readings from Last 3 Encounters:  11/28/23 (!) 374 lb 9.6 oz (169.9 kg)  11/25/23 (!) 375 lb 12.8 oz (170.5 kg)  11/15/23 (!) 375 lb 12.8  oz (170.5 kg)    BP Readings from Last 3 Encounters:  11/28/23 (!) 144/76  11/25/23 126/84  11/15/23 (!) 148/86     Physical Exam Vitals and nursing note reviewed.  Constitutional:      General: She is not in acute distress.    Appearance: Normal appearance. She is morbidly obese. She is not ill-appearing.     Comments: Using a walker  HENT:     Head: Normocephalic.     Right Ear: Tympanic membrane, ear canal and external ear normal. There is no impacted cerumen.     Left Ear: Tympanic membrane, ear canal and external ear normal. There is no impacted cerumen.     Nose: No congestion.     Mouth/Throat:     Mouth: Mucous membranes are moist.     Pharynx: No oropharyngeal exudate or posterior oropharyngeal erythema.  Eyes:     Extraocular Movements: Extraocular movements intact.     Conjunctiva/sclera: Conjunctivae normal.     Pupils: Pupils are equal, round, and reactive to light.  Cardiovascular:     Rate and Rhythm: Normal rate and regular rhythm.     Pulses: Normal pulses.     Heart sounds: Normal heart sounds. No murmur heard. Pulmonary:     Effort: Pulmonary effort is normal. No respiratory distress.     Breath sounds: Normal breath sounds. No wheezing.  Musculoskeletal:     Cervical back:  Normal range of motion.  Skin:    General: Skin is warm.  Neurological:     Mental Status: She is alert and oriented to person, place, and time.  Psychiatric:        Mood and Affect: Mood normal.        Behavior: Behavior normal.             Kennie Snedden M Harold Moncus, PA-C

## 2023-12-03 ENCOUNTER — Ambulatory Visit

## 2023-12-04 ENCOUNTER — Other Ambulatory Visit: Payer: Self-pay

## 2023-12-04 ENCOUNTER — Encounter

## 2023-12-05 ENCOUNTER — Ambulatory Visit: Attending: Physician Assistant

## 2023-12-05 DIAGNOSIS — R531 Weakness: Secondary | ICD-10-CM | POA: Diagnosis not present

## 2023-12-05 DIAGNOSIS — I509 Heart failure, unspecified: Secondary | ICD-10-CM | POA: Diagnosis not present

## 2023-12-05 DIAGNOSIS — G4733 Obstructive sleep apnea (adult) (pediatric): Secondary | ICD-10-CM | POA: Diagnosis not present

## 2023-12-06 DIAGNOSIS — G4733 Obstructive sleep apnea (adult) (pediatric): Secondary | ICD-10-CM | POA: Diagnosis not present

## 2023-12-09 ENCOUNTER — Encounter

## 2023-12-09 ENCOUNTER — Other Ambulatory Visit

## 2023-12-10 ENCOUNTER — Other Ambulatory Visit: Payer: Self-pay | Admitting: Cardiology

## 2023-12-11 ENCOUNTER — Other Ambulatory Visit: Payer: Self-pay

## 2023-12-11 NOTE — Patient Outreach (Signed)
 Complex Care Management   Visit Note  12/11/2023  Name:  Christina Dominguez MRN: [id number] DOB: [birth year]  Situation: Referral received for Complex Care Management related to  Personal Care Services  I obtained verbal consent from Patient.  Visit completed with patient  on the phone  Background:   Past Medical History:  Diagnosis Date   Anxiety    Aortic atherosclerosis (HCC) 08/19/2021   Arthritis    Asthma    Bipolar 1 disorder (HCC)    Chronic diastolic CHF (congestive heart failure) (HCC)    Class 3 severe obesity with serious comorbidity and body mass index (BMI) greater than or equal to 70 in adult 12/21/2019   Depression    Diastolic dysfunction 08/02/2021   Diverticulosis 08/19/2021   Edema of both lower extremities    Hepatic steatosis 08/19/2021   Hepatomegaly 08/19/2021   High blood pressure    Hyperlipidemia    Joint pain    Morbid obesity (HCC)    Pre-diabetes     Assessment:  Patient reports no contact from [facility] regarding request for PCS.  SW will follow up in another week to determine update.     Recommendation:   No provider recommendations at this time.  Follow Up Plan:   Telephone follow up appointment date/time:  05/141/25 at 1pm.  Dallis Dues, BSW [place]  [facility], [facility] Social Worker Direct Dial: [phone]  Fax: [phone] Website: [email or web address]

## 2023-12-11 NOTE — Patient Instructions (Signed)
 Visit Information  Thank you for taking time to visit with me today. Please don't hesitate to contact me if I can be of assistance to you before our next scheduled appointment.  Your next care management appointment is by telephone on 12/18/23 at 1pm  Please call the care guide team at [phone] if you need to cancel, schedule, or reschedule an appointment.   Please call 911 if you are experiencing a Mental Health or Behavioral Health Crisis or need someone to talk to.  Dallis Dues, BSW [place]  [facility], [facility] Social Worker Direct Dial: [phone]  Fax: [phone] Website: [email or web address]

## 2023-12-18 ENCOUNTER — Telehealth: Payer: Self-pay

## 2023-12-18 ENCOUNTER — Other Ambulatory Visit: Payer: Self-pay

## 2023-12-31 ENCOUNTER — Other Ambulatory Visit: Payer: Self-pay

## 2023-12-31 NOTE — Patient Outreach (Signed)
 Complex Care Management   Visit Note  12/31/2023  Name:  Christina Dominguez MRN: [id number] DOB: 04-18-1966  Situation: Referral received for Complex Care Management related to personal care I obtained verbal consent from Patient.  Visit completed with patient  on the phone  Background:   Past Medical History:  Diagnosis Date   Anxiety    Aortic atherosclerosis (HCC) 08/19/2021   Arthritis    Asthma    Bipolar 1 disorder (HCC)    Chronic diastolic CHF (congestive heart failure) (HCC)    Class 3 severe obesity with serious comorbidity and body mass index (BMI) greater than or equal to 70 in adult 12/21/2019   Depression    Diastolic dysfunction 08/02/2021   Diverticulosis 08/19/2021   Edema of both lower extremities    Hepatic steatosis 08/19/2021   Hepatomegaly 08/19/2021   High blood pressure    Hyperlipidemia    Joint pain    Morbid obesity (HCC)    Pre-diabetes     Assessment:  Patient reports no update from [facility].  SW t/c [facility] to discover the application was not processed because patient in under [facility] Managed Medicaid.  SW t/c Managed Medicaid and informed the provider would need to call in the order on the provider line for [facility] services.  [facility] did not notify patient of receipt of request.  SW sends provider a request to fax the [facility] request to [phone].   SDOH Interventions    Flowsheet Row Care Coordination from 10/23/2023 in [facility] [employer] Care Coordination Patient Outreach Telephone from 09/02/2023 in [place] HE[facility] POPULATION HEALTH DEPARTMENT Patient Outreach Telephone from 07/15/2023 in [place] POPULATION HEALTH DEPARTMENT Telephone from 06/11/2023 in [place] POPULATION HEALTH DEPARTMENT ED to Hosp-Admission (Discharged) from 10/03/2021 in [place] 2C CV PROGRESSIVE CARE  SDOH Interventions       Food Insecurity Interventions Intervention Not Indicated Intervention Not Indicated Intervention Not Indicated Intervention Not Indicated  Intervention Not Indicated  Housing Interventions Intervention Not Indicated Intervention Not Indicated Other (Comment)  [Patient has discussed the issue with the apartment manager] -- Intervention Not Indicated  Transportation Interventions Other (Comment)  [Will contact Medicaid Transportation for visits] Payor Benefit -- Intervention Not Indicated Intervention Not Indicated  Utilities Interventions Intervention Not Indicated Intervention Not Indicated Patient Declined -- --  Financial Strain Interventions -- -- -- -- Other (Comment)  [May need to consider disability]         Recommendation:   SW sent request to provider to submit PCS request to Managed Medicaid.  Follow Up Plan:   Telephone follow up appointment date/time:  01/14/24 at 3pm  Dallis Dues, BSW [place]  [facility], [facility] Social Worker Direct Dial: [phone]  Fax: [phone] Website: [email or web address]

## 2023-12-31 NOTE — Patient Instructions (Signed)
 Visit Information  Thank you for taking time to visit with me today. Please don't hesitate to contact me if I can be of assistance to you before our next scheduled appointment.  Your next care management appointment is by telephone on 01/14/24 at 3pm  Telephone follow up appointment date/time:  01/14/24 at 3pm  Please call the care guide team at [phone] if you need to cancel, schedule, or reschedule an appointment.   Please call 911 if you are experiencing a Mental Health or Behavioral Health Crisis or need someone to talk to.  Christina Dominguez, Christina Dominguez Crawford  [facility], [facility] Social Worker Direct Dial: [phone]  Fax: [phone] Website: [email or web address]

## 2024-01-02 ENCOUNTER — Telehealth (INDEPENDENT_AMBULATORY_CARE_PROVIDER_SITE_OTHER): Admitting: Physician Assistant

## 2024-01-02 DIAGNOSIS — H918X9 Other specified hearing loss, unspecified ear: Secondary | ICD-10-CM | POA: Diagnosis not present

## 2024-01-02 NOTE — Progress Notes (Signed)
   Virtual Visit via Video Note  I connected with  Christina Dominguez  on 01/02/24 at 11:30 AM EDT by a video enabled telemedicine application and verified that I am speaking with the correct person using two identifiers.  Location: Patient: home Provider: Nature conservation officer at [employer] Persons present: Patient and myself   I discussed the limitations of evaluation and management by telemedicine and the availability of in person appointments. The patient expressed understanding and agreed to proceed.   History of Present Illness:  58 yo female presenting for VV to discuss need for Personal Care Services.   Personal hygiene and cooking for herself are improved, and says she doesn't need any assistance with this. She is doing a lot of the cleaning around the house as well. Still having trouble with some bigger tasks around the home such as cleaning out the tub, taking the trash out, making the bed, etc.  Using a cane around the house & even short distances outside the home. Not using rollator walker at all anymore. Doing well walking from car to gas station for example with just her cane.   Back at the gym and utilizing the pool for exercise. Sometimes using the sauna.   Leaving the house a lot more too, happy about this.  Does not have VM setup, needs audiology appt still.   Observations/Objective:   Gen: Awake, alert, no acute distress Resp: Breathing is even and non-labored Psych: calm/pleasant demeanor Neuro: Alert and Oriented x 3, + facial symmetry, speech is clear.   Assessment & Plan Obesity Obesity with significant weight loss progress. Current weight is 362 lbs, down from 392 lbs. Engaging in physical activity such as walking in the pool and using the gym facilities. Improvement in mobility, transitioning from walker to cane for short distances. Actively participating in social activities and increased outings, including attending religious services and  Clinical biochemist. - Encourage continued weight loss efforts and healthy lifestyle changes. - Will try to obtain PCS for help with bigger house-cleaning tasks  Hearing loss Hearing loss with failed in-office hearing screening in April. Audiology referral was closed due to inability to leave a voicemail. She does not have a voicemail set up intentionally. - Provide audiology contact number to schedule hearing evaluation.    Follow Up Instructions:    I discussed the assessment and treatment plan with the patient. The patient was provided an opportunity to ask questions and all were answered. The patient agreed with the plan and demonstrated an understanding of the instructions.   The patient was advised to call back or seek an in-person evaluation if the symptoms worsen or if the condition fails to improve as anticipated.  Blanche Gallien M Damarea Merkel, PA-C

## 2024-01-06 DIAGNOSIS — G4733 Obstructive sleep apnea (adult) (pediatric): Secondary | ICD-10-CM | POA: Diagnosis not present

## 2024-01-08 ENCOUNTER — Other Ambulatory Visit: Payer: Self-pay

## 2024-01-08 NOTE — Patient Outreach (Signed)
 Complex Care Management   Visit Note  01/08/2024  Name:  Christina Dominguez MRN: [id number] DOB: 1966/04/24  Situation: Referral received for Complex Care Management related to Heart Failure and Weight Loss management, BMI 70.70 I obtained verbal consent from Patient.  Visit completed with Patient  on the phone  Background:   Past Medical History:  Diagnosis Date   Anxiety    Aortic atherosclerosis (HCC) 08/19/2021   Arthritis    Asthma    Bipolar 1 disorder (HCC)    Chronic diastolic CHF (congestive heart failure) (HCC)    Class 3 severe obesity with serious comorbidity and body mass index (BMI) greater than or equal to 70 in adult 12/21/2019   Depression    Diastolic dysfunction 08/02/2021   Diverticulosis 08/19/2021   Edema of both lower extremities    Hepatic steatosis 08/19/2021   Hepatomegaly 08/19/2021   High blood pressure    Hyperlipidemia    Joint pain    Morbid obesity (HCC)    Pre-diabetes     Assessment: Patient Reported Symptoms:  Cognitive Cognitive Status: Normal speech and language skills, Alert and oriented to person, place, and time, Insightful and able to interpret abstract concepts Cognitive/Intellectual Conditions Management [RPT]: None reported or documented in medical history or problem list   Health Maintenance Behaviors: Annual physical exam, Exercise, Healthy diet Health Facilitated by: Healthy diet, Rest  Neurological Neurological Review of Symptoms: No symptoms reported    HEENT HEENT Symptoms Reported: Change or loss of hearing, Other: Other HEENT Symptoms/Conditions: failed hearing screening and PCP has referred patient for Specialist follow up HEENT Conditions: Ear problem(s) HEENT Management Strategies: Routine screening, Coping strategies HEENT Self-Management Outcome: 3 (uncertain) HEENT Comment: Awaiting follow up with Otolaryngologist Ear problem(s)  Cardiovascular Cardiovascular Symptoms Reported: No symptoms reported Does patient  have uncontrolled Hypertension?: No (managed with antihypertensive) Cardiovascular Conditions: Heart failure, Hypertension, High blood cholesterol Cardiovascular Management Strategies: Medication therapy, Adequate rest, Coping strategies, Diet modification, Exercise, Routine screening, Weight management Weight: (!) 362 lb (164.2 kg) Cardiovascular Self-Management Outcome: 3 (uncertain)  Respiratory Respiratory Symptoms Reported: No symptoms reported Other Respiratory Symptoms: OSA uses CPAP Additional Respiratory Details: States she has been breathing much better, feels CHF more under control Respiratory Conditions: Asthma, COPD, Sleep disordered breathing Respiratory Self-Management Outcome: 4 (good)  Endocrine Patient reports the following symptoms related to hypoglycemia or hyperglycemia : No symptoms reported Is patient diabetic?: No    Gastrointestinal Gastrointestinal Symptoms Reported: Change in appetite Additional Gastrointestinal Details: currently on Wegovy 1.7mg  weekly for weight management, has had positive impact on decreasing appetite, has lost 30 pounds in 6 months Gastrointestinal Conditions: Other Other Gastrointestinal Conditions: BMI currently 70.70 Gastrointestinal Management Strategies: Diet modification, Exercise, Activity, Medication therapy Nutrition Risk Screen (CP): No indicators present  Genitourinary Genitourinary Symptoms Reported: No symptoms reported    Integumentary Integumentary Symptoms Reported: No symptoms reported    Musculoskeletal Musculoskelatal Symptoms Reviewed: Difficulty walking, Unsteady gait Additional Musculoskeletal Details: participating in water aerobics weekly for exercise Musculoskeletal Conditions: Osteoarthritis Musculoskeletal Management Strategies: Coping strategies, Adequate rest, Weight management, Activity, Routine screening Musculoskeletal Self-Management Outcome: 3 (uncertain) Falls in the past year?: Yes Number of falls in  past year: 1 or less Was there an injury with Fall?: No Fall Risk Category Calculator: 1 Patient Fall Risk Level: Low Fall Risk Patient at Risk for Falls Due to: History of fall(s), Impaired balance/gait, Impaired mobility Fall risk Follow up: Falls prevention discussed, Education provided, Falls evaluation completed  Psychosocial Psychosocial Symptoms Reported: No symptoms  reported Behavioral Health Conditions: Anxiety, Depression Behavioral Management Strategies: Medication therapy, Coping strategies, Adequate rest, Support system, Activity Behavioral Health Self-Management Outcome: 4 (good)   Do you feel physically threatened by others?: No      01/02/2024   11:15 AM  Depression screen PHQ 2/9  Decreased Interest 0  Down, Depressed, Hopeless 0  PHQ - 2 Score 0  Altered sleeping 3  Tired, decreased energy 3  Change in appetite 0  Feeling bad or failure about yourself  0  Trouble concentrating 2  Moving slowly or fidgety/restless 1  Suicidal thoughts 0  PHQ-9 Score 9  Difficult doing work/chores Somewhat difficult    There were no vitals filed for this visit.  Medications Reviewed Today     Reviewed by Randye Buttner, RN (Registered Nurse) on 01/08/24 at 1613  Med List Status: <None>   Medication Order Taking? Sig Documenting Provider Last Dose Status Informant  acetaminophen  (TYLENOL ) 650 MG CR tablet [id number] Yes Take 1,300 mg by mouth every 8 (eight) hours as needed for pain. [provider] Taking Active Self, Pharmacy Records  albuterol  (VENTOLIN  HFA) 108 [phone] Base) MCG/ACT inhaler [id number] Yes Inhale 2 puffs into the lungs every 6 (six) hours as needed for wheezing or shortness of breath. Quillian Brunt, MD Taking Active Self, Pharmacy Records  atorvastatin  (LIPITOR) 40 MG tablet [id number] Yes Take 1 tablet (40 mg total) by mouth daily. Carie Charity, NP Taking Active   cyanocobalamin  (VITAMIN B12) 100 MCG tablet [id number] Yes Take 100 mcg by mouth  daily. [provider] Taking Active   diclofenac  Sodium (VOLTAREN ) 1 % GEL [id number] Yes Apply 2 g topically 4 (four) times daily. [provider] Taking Active   dicyclomine  (BENTYL ) 10 MG capsule [id number] Yes Take 1 capsule (10 mg total) by mouth 4 (four) times daily -  before meals and at bedtime.  Patient taking differently: Take 10 mg by mouth 4 (four) times daily -  before meals and at bedtime. Pt admits to only taking one time per day   Allwardt, Deleta Felix, PA-C Taking Active Self, Pharmacy Records           Med Note (ROBB, MELANIE A   Mon Nov 04, 2023  2:09 PM) Taking twice daily  diphenhydrAMINE  HCl, Sleep, (ZZZQUIL PO) [id number] Yes Take 3 tablets by mouth at bedtime. [provider] Taking Active Self, Pharmacy Records           Med Note [facility], Mikiyah Glasner A   Wed Jan 08, 2024  4:12 PM) Takes 2 capsules as needed   famotidine (PEPCID) 20 MG tablet [id number]  Take by mouth. [provider]  Expired 11/04/23 2359   furosemide  (LASIX ) 80 MG tablet [id number] Yes Take 1 tablet (80 mg total) by mouth daily. Carie Charity, NP Taking Active   potassium chloride  SA (KLOR-CON  M) 20 MEQ tablet [id number] Yes Take 1 tablet (20 mEq total) by mouth daily. Carie Charity, NP Taking Active   valsartan  (DIOVAN ) 40 MG tablet [id number] Yes Take 1 tablet (40 mg total) by mouth daily. 1rst attempt, patient needs an appt for additional refills Krasowski, Robert J, MD Taking Active   Vitamin D , Ergocalciferol , (DRISDOL ) 1.25 MG (50000 UNIT) CAPS capsule [id number] Yes Take 1 capsule (50,000 Units total) by mouth every 7 (seven) days. Allwardt, Deleta Felix, PA-C Taking Active   WEGOVY 0.25 MG/0.5ML SOAJ [id number] Yes Inject into the skin. [provider] Taking Active  Med Note Saverio Curling, Melissaann Dizdarevic A   Wed Jan 08, 2024  4:13 PM) Twilla Galea 1.7mg  weekly            Recommendation:   As scheduled PCP Follow-up Specialty provider follow-up 6/16 Pulmonary follow  up for sleep  Follow Up Plan:   Telephone follow up appointment date/time:  6/25 @ 3:30pm  Bartholomew Light A. Saverio Curling RN, BA, [facility], CRRN [place]  [facility] Population Health RN Care Manager Direct Dial: [phone]  Fax: [phone]

## 2024-01-08 NOTE — Patient Instructions (Signed)
 Visit Information  Thank you for taking time to visit with me today. Please don't hesitate to contact me if I can be of assistance to you before our next scheduled telephone appointment.  Our next appointment is by telephone on 01/29/2024 at 3:30 pm  Following is a copy of your care plan:   Goals Addressed             This Visit's Progress    VBCI RN Care Plan       Problems:  Chronic Disease Management support and education needs related to CHF and Weight Loss management, patient's current BMI 70.70  Goal: Over the next 6 months the Patient will continue to work with RN Care Manager and/or Social Worker to address care management and care coordination needs related to CHF and Weight Loss management as evidenced by adherence to care management team scheduled appointments      Interventions:   Heart Failure Interventions: Provided education on low sodium diet Reviewed Heart Failure Action Plan in depth and provided written copy Assessed need for readable accurate scales in home Advised patient to weigh each morning after emptying bladder Discussed importance of daily weight and advised patient to weigh and record daily Reviewed role of diuretics in prevention of fluid overload and management of heart failure; Discussed the importance of keeping all appointments with provider Provided patient with education about the role of exercise in the management of heart failure Screening for signs and symptoms of depression related to chronic disease state  Assessed social determinant of health barriers   Weight Loss Interventions: Provided verbal and/or written education to patient re: provider recommended life style modifications  Screening for signs and symptoms of depression Offered to connect patient with psychology or social work support for counseling and supportive care Reviewed recommended dietary changes: avoid fad diets, make small/incremental dietary and exercise changes, eat at  the table and avoid eating in front of the TV, plan management of cravings, monitor snacking and cravings in food diary Patient chose [facility] in [place] for weight loss management and was placed on Wegovy, patient has lost 30 pounds in 6 months, was 392 pounds, now 362 pounds   Patient Self-Care Activities:  Attend all scheduled provider appointments Perform all self care activities independently  Perform IADL's (shopping, preparing meals, housekeeping, managing finances) independently Take medications as prescribed   Work with the social worker to address care coordination needs and will continue to work with the clinical team to address health care and disease management related needs Work with the pharmacist to address medication management needs and will continue to work with the clinical team to address health care and disease management related needs call office if I gain more than 2 pounds in one day or 5 pounds in one week keep legs up while sitting meet with dietitian track weight in diary use salt in moderation watch for swelling in feet, ankles and legs every day weigh myself daily begin a heart failure diary develop a rescue plan follow rescue plan if symptoms flare-up eat more whole grains, fruits and vegetables, lean meats and healthy fats track symptoms and what helps feel better or worse  Plan:  The patient has been provided with contact information for the care management team and has been advised to call with any health related questions or concerns.              Patient verbalizes understanding of instructions and care plan provided today and agrees  to view in MyChart. Active MyChart status and patient understanding of how to access instructions and care plan via MyChart confirmed with patient.     The patient has been provided with contact information for the care management team and has been advised to call with any health  related questions or concerns.   Please call the care guide team at [phone] if you need to cancel or reschedule your appointment.   Please call 1-800-273-TALK (toll free, 24 hour hotline) if you are experiencing a Mental Health or Behavioral Health Crisis or need someone to talk to.  Tauren Delbuono A. Saverio Curling RN, BA, [facility], CRRN [place]  [facility] Population Health RN Care Manager Direct Dial: [phone]  Fax: [phone]

## 2024-01-09 ENCOUNTER — Encounter

## 2024-01-09 ENCOUNTER — Other Ambulatory Visit

## 2024-01-14 ENCOUNTER — Other Ambulatory Visit: Payer: Self-pay

## 2024-01-14 NOTE — Patient Instructions (Signed)
 Visit Information  Thank you for taking time to visit with me today. Please don't hesitate to contact me if I can be of assistance to you before our next scheduled appointment.  Your next care management appointment is by telephone on 01/30/24 at 1pm   Please call the care guide team at [phone] if you need to cancel, schedule, or reschedule an appointment.   Please call 911 if you are experiencing a Mental Health or Behavioral Health Crisis or need someone to talk to.  Christina Dominguez, BSW [place]  [facility], [facility] Social Worker Direct Dial: [phone]  Fax: [phone] Website: [email or web address]

## 2024-01-14 NOTE — Patient Outreach (Signed)
 Complex Care Management   Visit Note  01/14/2024  Name:  Christina Dominguez MRN: [id number] DOB: 14-Feb-1966  Situation: Referral received for Complex Care Management related to PCS I obtained verbal consent from Patient.  Visit completed with patient  on the phone  Background:   Past Medical History:  Diagnosis Date   Anxiety    Aortic atherosclerosis (HCC) 08/19/2021   Arthritis    Asthma    Bipolar 1 disorder (HCC)    Chronic diastolic CHF (congestive heart failure) (HCC)    Class 3 severe obesity with serious comorbidity and body mass index (BMI) greater than or equal to 70 in adult 12/21/2019   Depression    Diastolic dysfunction 08/02/2021   Diverticulosis 08/19/2021   Edema of both lower extremities    Hepatic steatosis 08/19/2021   Hepatomegaly 08/19/2021   High blood pressure    Hyperlipidemia    Joint pain    Morbid obesity (HCC)    Pre-diabetes     Assessment:  Patient reports no update from [facility] on [facility] request.  The provider agreed to complete the referral.  SDOH Interventions    Flowsheet Row Patient Outreach from 01/08/2024 in [place] POPULATION HEALTH DEPARTMENT Care Coordination from 10/23/2023 in [facility] Community Care Coordination Patient Outreach Telephone from 09/02/2023 in [place] [facility] POPULATION HEALTH DEPARTMENT Patient Outreach Telephone from 07/15/2023 in [place] POPULATION HEALTH DEPARTMENT Telephone from 06/11/2023 in [place] POPULATION HEALTH DEPARTMENT ED to Hosp-Admission (Discharged) from 10/03/2021 in [place] 2C CV PROGRESSIVE CARE  SDOH Interventions        Food Insecurity Interventions Intervention Not Indicated Intervention Not Indicated Intervention Not Indicated Intervention Not Indicated Intervention Not Indicated Intervention Not Indicated  Housing Interventions Intervention Not Indicated Intervention Not Indicated Intervention Not Indicated Other (Comment)  [Patient has discussed the issue with the apartment manager]  -- Intervention Not Indicated  Transportation Interventions --  [see LCSW's note re intervention] Other (Comment)  [Will contact Medicaid Transportation for visits] Payor Benefit -- Intervention Not Indicated Intervention Not Indicated  Utilities Interventions Intervention Not Indicated Intervention Not Indicated Intervention Not Indicated Patient Declined -- --  Financial Strain Interventions -- -- -- -- -- Other (Comment)  [May need to consider disability]         Recommendation:   none  Follow Up Plan:   Telephone follow up appointment date/time:  01/30/24 at 1pm  Dallis Dues, BSW [place]  [facility], [facility] Social Worker Direct Dial: [phone]  Fax: [phone] Website: [email or web address]

## 2024-01-20 ENCOUNTER — Encounter: Payer: Self-pay | Admitting: Adult Health

## 2024-01-20 ENCOUNTER — Ambulatory Visit (INDEPENDENT_AMBULATORY_CARE_PROVIDER_SITE_OTHER): Admitting: Adult Health

## 2024-01-20 VITALS — BP 120/84 | HR 86 | Ht 60.0 in | Wt 369.0 lb

## 2024-01-20 DIAGNOSIS — G4734 Idiopathic sleep related nonobstructive alveolar hypoventilation: Secondary | ICD-10-CM

## 2024-01-20 DIAGNOSIS — Z6841 Body Mass Index (BMI) 40.0 and over, adult: Secondary | ICD-10-CM | POA: Diagnosis not present

## 2024-01-20 NOTE — Patient Instructions (Addendum)
 Continue on CPAP At bedtime, wear all night long for at least 6hr or more.  Work on healthy weight loss  Do not drive if sleepy  Set up for overnight oximetry test on room air on CPAP  Saline nasal spray Twice daily   Saline nasal gel At bedtime   Albuterol  inhaler As needed   Follow up with Dr. Washington Hacker in 6 months and As needed

## 2024-01-20 NOTE — Progress Notes (Signed)
 @Patient  ID: Christina Dominguez, female    DOB: November 10, 1965, 58 y.o.   MRN: [id number]  No chief complaint on file.   Referring provider: Allwardt, Mardy HERO, PA-C  HPI: 58 yo female never smoker followed for Asthma, Chronic bronchitis, Moderate Pulmonary Hypertension, Chronic Diastolic Heart Failure and OSA    TEST/EVENTS :  CTA 10/03/21 - No PE. Dilation of main pulmonary artery suggestive of PH. 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 Pulmonary artery enlargement. CXR 10/09/2021-bibasilar atelectasis CTA 06/18/23 - Neg for PE, bilateral mosaic attenuation, right paraspinal posterior mediastinal mass 3 x 2.1 cm, previously 4.5 x 3 cm characterized as benign cystic lesion on MRI 10/05/21   PFT: 01/30/22 FVC 1.24 (42%) FEV1 1.02 (44%) Ratio 75  DLCO 70% Interpretation: Severe obstructive defect based on FEV1/SVC. Partial bronchodilator response however not significant. This does not preclude the benefit of bronchodilators. Mildly reduced DLCO.     Labs: CBC Labs (Brief)          Component Value Date/Time    WBC 7.4 10/21/2023 1505    RBC 4.05 10/21/2023 1505    HGB 12.3 10/21/2023 1505    HCT 37.3 10/21/2023 1505    PLT 348.0 10/21/2023 1505    MCV 92.2 10/21/2023 1505    MCH 28.9 06/21/2023 0521    MCHC 32.9 10/21/2023 1505    RDW 14.8 10/21/2023 1505    LYMPHSABS 1.0 10/21/2023 1505    MONOABS 0.4 10/21/2023 1505    EOSABS 0.2 10/21/2023 1505    BASOSABS 0.0 10/21/2023 1505      Absolute eos 10/03/21 - 200   Sleep: Split night 12/28/21 - Mild OSA and PLMS with AHI 7.7 Nadir SpO2 75%. Recommend therapy including CPAP, oral appliance, or surgical assessment.   HST 09/24/23 AHI 6.1/h Nadir SpO2 69% Interpretation: Mild OSA with oxygen  desaturation    CPAP compliance 11/05/23-11/24/23 Usage days 16/20 days (80%) >4 hours 14/20 days (70%) Average hours 6 hours 52 min   01/20/2024 Follow up ; Asthma, OSA, DCHF, Pulmonary HTN secondary to OSA.  Discussed the use of AI scribe software for clinical note transcription with the patient, who gave verbal consent to proceed.  History of Present Illness   Christina Dominguez is a 58 year old female with sleep apnea who presents for follow of sleep apnea.   She finds CPAP therapy beneficial, experiencing improvements in feeling more rested, reduced tiredness, and decreased leg swelling. She uses the CPAP machine for an average of six and a half hours per night, Despite using the water chamber with distilled water and adjusting the tube temperature, she continues to experience dry mouth. She has increased the humidity level on her CPAP machine but remains cautious about condensation in the tubing. Her mouth tends to open during sleep, contributing to the dryness, and she uses throat lozenges at night to alleviate symptoms.  Received a new CPAP in April 2025.  Uses a fullface mask.  DME is [facility].  She is prediabetic and currently on Wegovy, which has facilitated weight loss. Medicaid declined a request for Zepbound, but she continues with Wegovy.  Her asthma has improved since using the CPAP machine, and she has not needed her albuterol  inhaler recently. She notes improved breathing and increased tolerance for physical activity.  She experiences difficulty staying asleep, waking up every hour. She takes 5 mg of melatonin but finds it ineffective. She has a history of using Ambien, which led to sleep eating. No anxiety or depression is reported, but she gets up to use the bathroom at night. She takes a fluid pill in the morning and stops drinking liquids by 7 PM. Her bedtime varies between 10 PM and 12 AM, and she usually wakes up around 9 AM.  No caffeine intake and does not take naps. She acknowledges  significant screen time during the day.     CPAP download shows excellent compliance with daily average usage at 7 hours.  She is on auto CPAP 5 to 15 cm H2O.  AHI 0.3/.  Daily average pressure at 8.3 cm H2O.   Allergies  Allergen Reactions   Aspirin Nausea And Vomiting   Egg-Derived Products Swelling   Influenza Vaccines Swelling    Immunization History  Administered Date(s) Administered   Janssen (J&J) SARS-COV-2 Vaccination 11/16/2019   Moderna Sars-Covid-2 Vaccination 08/11/2020   PNEUMOCOCCAL CONJUGATE-20 06/21/2023   Tdap 04/01/2019   Zoster Recombinant(Shingrix ) 10/26/2021, 11/28/2023    Past Medical History:  Diagnosis Date   Anxiety    Aortic atherosclerosis (HCC) 08/19/2021   Arthritis    Asthma    Bipolar 1 disorder (HCC)    Chronic diastolic CHF (congestive heart failure) (HCC)    Class 3 severe obesity with serious comorbidity and body mass index (BMI) greater than or equal to 70 in adult 12/21/2019   Depression    Diastolic dysfunction 08/02/2021   Diverticulosis 08/19/2021   Edema of both lower extremities    Hepatic steatosis 08/19/2021   Hepatomegaly 08/19/2021   High blood pressure    Hyperlipidemia    Joint pain    Morbid obesity (HCC)    Pre-diabetes     Tobacco History: Social History   Tobacco Use  Smoking Status Never   Passive exposure: Never  Smokeless Tobacco Never   Counseling given: Not Answered   Outpatient Medications Prior to Visit  Medication Sig Dispense Refill   acetaminophen  (TYLENOL ) 650 MG CR tablet Take 1,300 mg by mouth every 8 (eight) hours as needed for pain.     albuterol  (VENTOLIN  HFA) 108 (90 Base) MCG/ACT inhaler Inhale 2 puffs into the lungs every 6 (six) hours as needed for wheezing or shortness of breath. 8 g 6   atorvastatin  (LIPITOR) 40 MG tablet Take 1 tablet (40 mg total) by mouth daily. 90 tablet 1   cyanocobalamin  (VITAMIN B12) 100 MCG tablet Take 100 mcg by mouth daily.     diclofenac  Sodium (VOLTAREN )  1 % GEL Apply 2 g topically 4 (four) times daily.     dicyclomine  (BENTYL ) 10 MG capsule Take 1 capsule (10 mg total) by mouth 4 (four) times daily -  before meals and at bedtime. (Patient taking differently: Take 10 mg by mouth 4 (four) times daily -  before meals and at bedtime. Pt admits to only taking one time per day) 120 capsule 2   diphenhydrAMINE  HCl, Sleep, (ZZZQUIL PO) Take 3 tablets by mouth at bedtime.     famotidine (PEPCID) 20 MG tablet Take by mouth.     furosemide  (LASIX ) 80 MG tablet Take 1 tablet (80 mg total) by mouth daily. 90 tablet 1   potassium chloride  SA (KLOR-CON  M) 20 MEQ tablet Take 1 tablet (20 mEq total) by mouth daily. 90 tablet 1   valsartan  (DIOVAN ) 40 MG tablet Take  1 tablet (40 mg total) by mouth daily. 1rst attempt, patient needs an appt for additional refills 30 tablet 1   Vitamin D , Ergocalciferol , (DRISDOL ) 1.25 MG (50000 UNIT) CAPS capsule Take 1 capsule (50,000 Units total) by mouth every 7 (seven) days. 12 capsule 0   WEGOVY 0.25 MG/0.5ML SOAJ Inject into the skin.     No facility-administered medications prior to visit.     Review of Systems:   Constitutional:   No  weight loss, night sweats,  Fevers, chills, fatigue, or  lassitude.  HEENT:   No headaches,  Difficulty swallowing,  Tooth/dental problems, or  Sore throat,                No sneezing, itching, ear ache, nasal congestion, post nasal drip,   CV:  No chest pain,  Orthopnea, PND, swelling in lower extremities, anasarca, dizziness, palpitations, syncope.   GI  No heartburn, indigestion, abdominal pain, nausea, vomiting, diarrhea, change in bowel habits, loss of appetite, bloody stools.   Resp: No shortness of breath with exertion or at rest.  No excess mucus, no productive cough,  No non-productive cough,  No coughing up of blood.  No change in color of mucus.  No wheezing.  No chest wall deformity  Skin: no rash or lesions.  GU: no dysuria, change in color of urine, no urgency or  frequency.  No flank pain, no hematuria   MS:  No joint pain or swelling.  No decreased range of motion.  No back pain.    Physical Exam    GEN: A/Ox3; pleasant , NAD, well nourished    HEENT:  [place]/AT,  NOSE-clear, THROAT-clear, no lesions, no postnasal drip or exudate noted.   NECK:  Supple w/ fair ROM; no JVD; normal carotid impulses w/o bruits; no thyromegaly or nodules palpated; no lymphadenopathy.    RESP  Clear  P & A; w/o, wheezes/ rales/ or rhonchi. no accessory muscle use, no dullness to percussion  CARD:  RRR, no m/r/g, no peripheral edema, pulses intact, no cyanosis or clubbing.  GI:   Soft & nt; nml bowel sounds; no organomegaly or masses detected.   Musco: Warm bil, no deformities or joint swelling noted.   Neuro: alert, no focal deficits noted.    Skin: Warm, no lesions or rashes    Lab Results:    BNP   Imaging: No results found.  Administration History     None          Latest Ref Rng & Units 01/30/2022    1:13 PM  PFT Results  FVC-Pre L 1.26   FVC-Predicted Pre % 43   FVC-Post L 1.24   FVC-Predicted Post % 42   Pre FEV1/FVC % % 75   Post FEV1/FCV % % 82   FEV1-Pre L 0.95   FEV1-Predicted Pre % 41   FEV1-Post L 1.02   DLCO uncorrected ml/min/mmHg 12.55   DLCO UNC% % 70   DLCO corrected ml/min/mmHg 13.11   DLCO COR %Predicted % 73   DLVA Predicted % 79     No results found for: NITRICOXIDE      Assessment & Plan:   No problem-specific Assessment & Plan notes found for this encounter.  Assessment and Plan    Obstructive Sleep Apnea   Obstructive sleep apnea is well-managed with CPAP therapy. She feels more rested, less tired, and has reduced leg swelling. CPAP usage averages six and a half hours - 7hr per night with no significant apnea  episodes. Some mouth dryness is likely due to mouth breathing during sleep. Order overnight pulse oximetry to assess oxygen  levels while using CPAP. Continue CPAP therapy with current  settings. Recommend saline nasal spray and gel to alleviate dryness. Increase CPAP humidity by one level to address dryness, with caution for condensation. Discuss the option of a chin strap with the CPAP supplier, though it may not be comfortable. Avoid using throat lozenges during sleep due to aspiration risk.  Asthma, well-controlled   Asthma is well-controlled with no recent albuterol  inhaler use. Breathing has improved, and activity tolerance has increased since using CPAP. Use albuterol  inhaler as needed.  Insomnia   Insomnia is characterized by difficulty staying asleep, waking up every hour. She uses melatonin 5 mg. Ambien was ineffective and caused sleep eating. Risks of sleep aids include addiction, balance issues, and potential interactions with sleep apnea. Emphasize a consistent sleep schedule and reducing time in bed to improve sleep quality. Advise maintaining a consistent sleep schedule with no more than eight hours in bed. Continue melatonin 5 mg as needed. Reduce screen time during the day and take breaks to go outside. Discuss with primary care physician regarding potential overactive bladder contributing to nocturia.  Leg swelling   Leg swelling has improved with CPAP therapy and weight loss efforts. Continue current management with CPAP and weight loss.  Morbid obesity   Continue with healthy weight loss    Madelin Stank, NP 01/20/2024

## 2024-01-24 ENCOUNTER — Encounter

## 2024-01-24 ENCOUNTER — Other Ambulatory Visit

## 2024-01-28 ENCOUNTER — Ambulatory Visit: Admitting: Audiologist

## 2024-01-29 ENCOUNTER — Other Ambulatory Visit: Payer: Self-pay

## 2024-01-29 NOTE — Patient Outreach (Signed)
 Complex Care Management   Visit Note  01/29/2024  Name:  Christina Dominguez MRN: [id number] DOB: 1966/06/02  Situation: Referral received for Complex Care Management related to Heart Failure and Weight Management challenges, BMI >70. I obtained verbal consent from Patient.  Visit completed with patient  on the phone  Background:   Past Medical History:  Diagnosis Date   Anxiety    Aortic atherosclerosis (HCC) 08/19/2021   Arthritis    Asthma    Bipolar 1 disorder (HCC)    Chronic diastolic CHF (congestive heart failure) (HCC)    Class 3 severe obesity with serious comorbidity and body mass index (BMI) greater than or equal to 70 in adult 12/21/2019   Depression    Diastolic dysfunction 08/02/2021   Diverticulosis 08/19/2021   Edema of both lower extremities    Hepatic steatosis 08/19/2021   Hepatomegaly 08/19/2021   High blood pressure    Hyperlipidemia    Joint pain    Morbid obesity (HCC)    Pre-diabetes     Assessment: Patient Reported Symptoms:  Cognitive Cognitive Status: Insightful and able to interpret abstract concepts, Normal speech and language skills, Alert and oriented to person, place, and time Cognitive/Intellectual Conditions Management [RPT]: None reported or documented in medical history or problem list   Health Maintenance Behaviors: Annual physical exam, Exercise, Healthy diet, Sleep adequate Health Facilitated by: Healthy diet  Neurological Neurological Review of Symptoms: No symptoms reported    HEENT HEENT Symptoms Reported: Change or loss of hearing HEENT Conditions: Ear problem(s) HEENT Management Strategies: Routine screening, Coping strategies HEENT Self-Management Outcome: 3 (uncertain) HEENT Comment: Awaiting follow up with Otolaryngologist Ear problem(s)  Cardiovascular Cardiovascular Symptoms Reported: No symptoms reported Does patient have uncontrolled Hypertension?: No Cardiovascular Conditions: Hypertension, High blood cholesterol,  Heart failure Cardiovascular Management Strategies: Medication therapy, Adequate rest, Coping strategies, Diet modification, Exercise, Routine screening, Weight management, Medical device (utilizes CPAP at night) Cardiovascular Self-Management Outcome: 4 (good)  Respiratory Respiratory Symptoms Reported: No symptoms reported Other Respiratory Symptoms: uses CPAP nightly due to Obstructive Sleep Apnea (OSA) Additional Respiratory Details: States she is sleeping better, breathing better, and feels CHF is under control Respiratory Conditions: Asthma, COPD, Sleep disordered breathing Respiratory Self-Management Outcome: 4 (good)  Endocrine Patient reports the following symptoms related to hypoglycemia or hyperglycemia : No symptoms reported Is patient diabetic?: No    Gastrointestinal Gastrointestinal Symptoms Reported: Change in appetite (due to weight loss treatment with [facility]) Gastrointestinal Conditions: Other Other Gastrointestinal Conditions: BMI remains >70, states she has been told she is not a candidate for bariatric surgery d/t her cardiopulmonary status Gastrointestinal Management Strategies: Diet modification, Exercise, Activity, Medication therapy, Coping strategies    Genitourinary Genitourinary Symptoms Reported: No symptoms reported    Integumentary Integumentary Symptoms Reported: No symptoms reported    Musculoskeletal Musculoskelatal Symptoms Reviewed: Difficulty walking, Unsteady gait Musculoskeletal Conditions: Osteoarthritis Musculoskeletal Management Strategies:  (uses cane for stability) Musculoskeletal Self-Management Outcome: 3 (uncertain) Falls in the past year?: Yes Number of falls in past year: 1 or less Was there an injury with Fall?: No Fall Risk Category Calculator: 1 Patient Fall Risk Level: Low Fall Risk Patient at Risk for Falls Due to: History of fall(s), Impaired balance/gait, Impaired mobility Fall risk Follow up: Falls prevention discussed, Education  provided, Falls evaluation completed  Psychosocial Psychosocial Symptoms Reported: No symptoms reported Behavioral Health Conditions: Anxiety, Depression Behavioral Management Strategies: Medication therapy, Coping strategies, Adequate rest, Support system, Activity Behavioral Health Self-Management Outcome: 4 (good)  01/02/2024   11:15 AM  Depression screen PHQ 2/9  Decreased Interest 0  Down, Depressed, Hopeless 0  PHQ - 2 Score 0  Altered sleeping 3  Tired, decreased energy 3  Change in appetite 0  Feeling bad or failure about yourself  0  Trouble concentrating 2  Moving slowly or fidgety/restless 1  Suicidal thoughts 0  PHQ-9 Score 9  Difficult doing work/chores Somewhat difficult    There were no vitals filed for this visit.  Medications Reviewed Today     Reviewed by Gordy Channing LABOR, RN (Registered Nurse) on 01/29/24 at 1603  Med List Status: <None>   Medication Order Taking? Sig Documenting Provider Last Dose Status Informant  acetaminophen  (TYLENOL ) 650 MG CR tablet [id number] Yes Take 1,300 mg by mouth every 8 (eight) hours as needed for pain. [provider]  Active Self, Pharmacy Records  albuterol  (VENTOLIN  HFA) 108 [phone] Base) MCG/ACT inhaler [id number] Yes Inhale 2 puffs into the lungs every 6 (six) hours as needed for wheezing or shortness of breath. Kassie Acquanetta Bradley, MD  Active Self, Pharmacy Records  atorvastatin  (LIPITOR) 40 MG tablet [id number] Yes Take 1 tablet (40 mg total) by mouth daily. Emelia Josefa HERO, NP  Active   cyanocobalamin  (VITAMIN B12) 100 MCG tablet [id number] Yes Take 100 mcg by mouth daily. [provider]  Active   diclofenac  Sodium (VOLTAREN ) 1 % GEL [id number] Yes Apply 2 g topically 4 (four) times daily. [provider]  Active   dicyclomine  (BENTYL ) 10 MG capsule [id number] Yes Take 1 capsule (10 mg total) by mouth 4 (four) times daily -  before meals and at bedtime.  Patient taking differently: Take 10 mg  by mouth 4 (four) times daily -  before meals and at bedtime. Pt admits to only taking one time per day   Allwardt, Mardy HERO, PA-C  Active Self, Pharmacy Records           Med Note (ROBB, MELANIE A   Mon Nov 04, 2023  2:09 PM) Taking twice daily  diphenhydrAMINE  HCl, Sleep, (ZZZQUIL PO) [id number] Yes Take 3 tablets by mouth at bedtime. [provider]  Active Self, Pharmacy Records           Med Note [facility], Courtnie Brenes A   Wed Jan 08, 2024  4:12 PM) Takes 2 capsules as needed   famotidine (PEPCID) 20 MG tablet [id number]  Take by mouth. [provider]  Expired 11/04/23 2359   furosemide  (LASIX ) 80 MG tablet [id number] Yes Take 1 tablet (80 mg total) by mouth daily. Emelia Josefa HERO, NP  Active   potassium chloride  SA (KLOR-CON  M) 20 MEQ tablet [id number] Yes Take 1 tablet (20 mEq total) by mouth daily. Emelia Josefa HERO, NP  Active   valsartan  (DIOVAN ) 40 MG tablet [id number] Yes Take 1 tablet (40 mg total) by mouth daily. 1rst attempt, patient needs an appt for additional refills Krasowski, Robert J, MD  Active   Vitamin D , Ergocalciferol , (DRISDOL ) 1.25 MG (50000 UNIT) CAPS capsule [id number] Yes Take 1 capsule (50,000 Units total) by mouth every 7 (seven) days. Allwardt, Mardy HERO, PA-C  Active   WEGOVY 0.25 MG/0.5ML EMMANUEL [id number] Yes Inject into the skin. [provider]  Active            Med Note CHANETTA, Kaitlyn Franko A   Wed Jan 08, 2024  4:13 PM) Marguerite 1.7mg  weekly            Recommendation:   Specialty provider follow-up  with [facility] next week to discuss ongoing treatment with [facility]  Follow Up Plan:   Telephone follow up appointment date/time:  02/12/2024 @ 3:30pm  Channing A. Gordy RN, BA, [facility], CRRN   [facility] Population Health RN Care Manager Direct Dial: [phone]  Fax: [phone]

## 2024-01-29 NOTE — Patient Instructions (Signed)
 Visit Information  Thank you for taking time to visit with me today. Please don't hesitate to contact me if I can be of assistance to you before our next scheduled telephone appointment.  Our next appointment is by telephone on 02/12/24 at 3;30pm  Following is a copy of your care plan:   Goals Addressed             This Visit's Progress    [facility] RN Care Plan       Problems:  Chronic Disease Management support and education needs related to CHF and Weight Loss management, patient's current BMI 70.70  Goal: Over the next 5 months the Patient will continue to work with RN Care Manager and/or Social Worker to address care management and care coordination needs related to CHF and Weight Loss management as evidenced by adherence to care management team scheduled appointments      Interventions:   Heart Failure Interventions: Provided education on low sodium diet Reviewed Heart Failure Action Plan in depth and provided written copy Assessed need for readable accurate scales in home Advised patient to weigh each morning after emptying bladder Discussed importance of daily weight and advised patient to weigh and record daily Reviewed role of diuretics in prevention of fluid overload and management of heart failure; Discussed the importance of keeping all appointments with provider Provided patient with education about the role of exercise in the management of heart failure Screening for signs and symptoms of depression related to chronic disease state  Assessed social determinant of health barriers   Weight Loss Interventions: Provided verbal and/or written education to patient re: provider recommended life style modifications  Screening for signs and symptoms of depression Offered to connect patient with psychology or social work support for counseling and supportive care Reviewed recommended dietary changes: avoid fad diets, make small/incremental dietary and exercise changes, eat at the  table and avoid eating in front of the TV, plan management of cravings, monitor snacking and cravings in food diary Patient chose [facility] in [place] for weight loss management and was placed on Wegovy, patient has lost 30 pounds in 6 months, was 392 pounds, now 362 pounds   Patient Self-Care Activities:  Attend all scheduled provider appointments Perform all self care activities independently  Perform IADL's (shopping, preparing meals, housekeeping, managing finances) independently Take medications as prescribed   Work with the social worker to address care coordination needs and will continue to work with the clinical team to address health care and disease management related needs Work with the pharmacist to address medication management needs and will continue to work with the clinical team to address health care and disease management related needs call office if I gain more than 2 pounds in one day or 5 pounds in one week keep legs up while sitting meet with dietitian track weight in diary use salt in moderation watch for swelling in feet, ankles and legs every day weigh myself daily begin a heart failure diary develop a rescue plan follow rescue plan if symptoms flare-up eat more whole grains, fruits and vegetables, lean meats and healthy fats track symptoms and what helps feel better or worse  Plan:  The patient has been provided with contact information for the care management team and has been advised to call with any health related questions or concerns.              Patient verbalizes understanding of instructions and care plan provided today and agrees to  view in MyChart. Active MyChart status and patient understanding of how to access instructions and care plan via MyChart confirmed with patient.     The patient has been provided with contact information for the care management team and has been advised to call with any health related  questions or concerns.   Please call the care guide team at [phone] if you need to cancel or reschedule your appointment.   Please call 1-800-273-TALK (toll free, 24 hour hotline) if you are experiencing a Mental Health or Behavioral Health Crisis or need someone to talk to.  Momin Misko A. Gordy RN, BA, [facility], CRRN [place]  [facility] Population Health RN Care Manager Direct Dial: [phone]  Fax: [phone]

## 2024-01-30 ENCOUNTER — Other Ambulatory Visit: Payer: Self-pay

## 2024-01-30 NOTE — Patient Outreach (Signed)
 Complex Care Management   Visit Note  01/30/2024  Name:  Christina Dominguez MRN: [id number] DOB: [birth year]  Situation: Referral received for Complex Care Management related to SDOH Barriers:  PCS services I obtained verbal consent from Patient.  Visit completed with patient  on the phone  Background:   Past Medical History:  Diagnosis Date   Anxiety    Aortic atherosclerosis (HCC) 08/19/2021   Arthritis    Asthma    Bipolar 1 disorder (HCC)    Chronic diastolic CHF (congestive heart failure) (HCC)    Class 3 severe obesity with serious comorbidity and body mass index (BMI) greater than or equal to 70 in adult 12/21/2019   Depression    Diastolic dysfunction 08/02/2021   Diverticulosis 08/19/2021   Edema of both lower extremities    Hepatic steatosis 08/19/2021   Hepatomegaly 08/19/2021   High blood pressure    Hyperlipidemia    Joint pain    Morbid obesity (HCC)    Pre-diabetes     Assessment:  Patient reports no reply regarding PCS services.  SW agreed to communicate with provider to confirm in referral was submitted.    SDOH Interventions    Flowsheet Row Patient Outreach from 01/29/2024 in [place] POPULATION HEALTH DEPARTMENT Patient Outreach from 01/08/2024 in [place] POPULATION HEALTH DEPARTMENT Care Coordination from 10/23/2023 in [facility] Community Care Coordination Patient Outreach Telephone from 09/02/2023 in [place] [facility] POPULATION HEALTH DEPARTMENT Patient Outreach Telephone from 07/15/2023 in [place] POPULATION HEALTH DEPARTMENT Telephone from 06/11/2023 in [place] POPULATION HEALTH DEPARTMENT  SDOH Interventions        Food Insecurity Interventions Intervention Not Indicated Intervention Not Indicated Intervention Not Indicated Intervention Not Indicated Intervention Not Indicated Intervention Not Indicated  Housing Interventions Intervention Not Indicated Intervention Not Indicated Intervention Not Indicated Intervention Not Indicated  Other (Comment)  [Patient has discussed the issue with the apartment manager] --  Transportation Interventions -- --  candance LCSW's note re intervention] Other (Comment)  [Will contact Medicaid Transportation for visits] Payor Benefit -- Intervention Not Indicated  Utilities Interventions Intervention Not Indicated Intervention Not Indicated Intervention Not Indicated Intervention Not Indicated Patient Declined --  Health Literacy Interventions Intervention Not Indicated -- -- -- -- --      Recommendation:   SW message provider to confirm if PCS referral was submitted.  Follow Up Plan:   Telephone follow up appointment date/time:  02/04/24 at 2pm.  Tillman Gardener, BSW [place]  [facility], [facility] Social Worker Direct Dial: [phone]  Fax: [phone] Website: [email or web address]

## 2024-01-30 NOTE — Patient Instructions (Signed)
 Visit Information  Thank you for taking time to visit with me today. Please don't hesitate to contact me if I can be of assistance to you before our next scheduled appointment.  Your next care management appointment is by telephone on 02/04/24 at at 2pm  Please call the care guide team at [phone] if you need to cancel, schedule, or reschedule an appointment.   Please call 911 if you are experiencing a Mental Health or Behavioral Health Crisis or need someone to talk to.  Tillman Gardener, BSW [place]  [facility], [facility] Social Worker Direct Dial: [phone]  Fax: [phone] Website: [email or web address]

## 2024-02-03 ENCOUNTER — Encounter

## 2024-02-03 ENCOUNTER — Inpatient Hospital Stay: Admission: RE | Admit: 2024-02-03 | Source: Ambulatory Visit

## 2024-02-04 ENCOUNTER — Other Ambulatory Visit

## 2024-02-04 ENCOUNTER — Other Ambulatory Visit: Payer: Self-pay

## 2024-02-04 ENCOUNTER — Encounter

## 2024-02-04 NOTE — Patient Instructions (Signed)
 Visit Information  Thank you for taking time to visit with me today. Please don't hesitate to contact me if I can be of assistance to you before our next scheduled appointment.  Your next care management appointment is by telephone on 02/18/24 at 2pm  Please call the care guide team at [phone] if you need to cancel, schedule, or reschedule an appointment.   Please call 911 if you are experiencing a Mental Health or Behavioral Health Crisis or need someone to talk to.  Tillman Gardener, BSW [place]  [facility], [facility] Social Worker Direct Dial: [phone]  Fax: [phone] Website: [email or web address]

## 2024-02-04 NOTE — Patient Outreach (Signed)
 Complex Care Management   Visit Note  02/04/2024  Name:  Christina Dominguez MRN: [id number] DOB: 13-Dec-1965  Situation: Referral received for Complex Care Management related to SDOH Barriers:  PCS I obtained verbal consent from Patient.  Visit completed with patient  on the phone  Background:   Past Medical History:  Diagnosis Date   Anxiety    Aortic atherosclerosis (HCC) 08/19/2021   Arthritis    Asthma    Bipolar 1 disorder (HCC)    Chronic diastolic CHF (congestive heart failure) (HCC)    Class 3 severe obesity with serious comorbidity and body mass index (BMI) greater than or equal to 70 in adult 12/21/2019   Depression    Diastolic dysfunction 08/02/2021   Diverticulosis 08/19/2021   Edema of both lower extremities    Hepatic steatosis 08/19/2021   Hepatomegaly 08/19/2021   High blood pressure    Hyperlipidemia    Joint pain    Morbid obesity (HCC)    Pre-diabetes     Assessment:  Patient is updated on communication with provider.  [employer] has provided conflicting information about the [facility] referral submission.  Provider is working with [facility] to resolve.  Patient reports when she originally requested services she was bed bound but is able to do more for herself.  Patient continues to need help with meal preparation and housekeeping.   Recommendation:   SW communicated with provider regarding submitting the PCS request and confusion from the instruction [employer] provided.  SW directs provider to contact [facility] for fax number to submit request.  Follow Up Plan:   Telephone follow up appointment date/time:  02/18/24 at 2pm  Tillman Gardener, BSW [place]  [facility], [facility] Social Worker Direct Dial: [phone]  Fax: [phone] Website: [email or web address]

## 2024-02-05 DIAGNOSIS — G4733 Obstructive sleep apnea (adult) (pediatric): Secondary | ICD-10-CM | POA: Diagnosis not present

## 2024-02-11 ENCOUNTER — Ambulatory Visit
Admission: RE | Admit: 2024-02-11 | Discharge: 2024-02-11 | Disposition: A | Discharge status: Home / Self Care | Source: Ambulatory Visit | Attending: Physician Assistant | Admitting: Physician Assistant

## 2024-02-11 ENCOUNTER — Ambulatory Visit: Admitting: Audiologist

## 2024-02-11 ENCOUNTER — Ambulatory Visit
Admission: RE | Admit: 2024-02-11 | Discharge: 2024-02-11 | Disposition: A | Discharge status: Home / Self Care | Source: Ambulatory Visit | Attending: Physician Assistant

## 2024-02-11 DIAGNOSIS — N6315 Unspecified lump in the right breast, overlapping quadrants: Secondary | ICD-10-CM | POA: Diagnosis not present

## 2024-02-11 DIAGNOSIS — N6310 Unspecified lump in the right breast, unspecified quadrant: Secondary | ICD-10-CM

## 2024-02-12 ENCOUNTER — Other Ambulatory Visit: Payer: Self-pay

## 2024-02-12 ENCOUNTER — Ambulatory Visit: Payer: Self-pay | Admitting: Physician Assistant

## 2024-02-12 NOTE — Patient Outreach (Signed)
 Complex Care Management   Visit Note  02/12/2024  Name:  Christina Dominguez MRN: [id number] DOB: 10-Sep-1965  Situation: Referral received for Complex Care Management related to Heart Failure and Weight Loss Management (BMI>70). I obtained verbal consent from Patient.  Visit completed with patient  on the phone  Background:   Past Medical History:  Diagnosis Date   Anxiety    Aortic atherosclerosis (HCC) 08/19/2021   Arthritis    Asthma    Bipolar 1 disorder (HCC)    Chronic diastolic CHF (congestive heart failure) (HCC)    Class 3 severe obesity with serious comorbidity and body mass index (BMI) greater than or equal to 70 in adult 12/21/2019   Depression    Diastolic dysfunction 08/02/2021   Diverticulosis 08/19/2021   Edema of both lower extremities    Hepatic steatosis 08/19/2021   Hepatomegaly 08/19/2021   High blood pressure    Hyperlipidemia    Joint pain    Morbid obesity (HCC)    Pre-diabetes     Assessment: Patient Reported Symptoms:  Cognitive Cognitive Status: Insightful and able to interpret abstract concepts, Normal speech and language skills, Alert and oriented to person, place, and time Cognitive/Intellectual Conditions Management [RPT]: None reported or documented in medical history or problem list   Health Maintenance Behaviors: Annual physical exam, Exercise, Healthy diet, Sleep adequate Health Facilitated by: Healthy diet  Neurological Neurological Review of Symptoms: No symptoms reported    HEENT HEENT Symptoms Reported: Change or loss of hearing HEENT Management Strategies: Routine screening, Coping strategies HEENT Self-Management Outcome: 3 (uncertain) HEENT Comment: awaiting follow up w/ Otolaryngologist Ear problem(s) Appt w/ Audiology 02/17/24   Cardiovascular Cardiovascular Symptoms Reported: No symptoms reported Does patient have uncontrolled Hypertension?: No Cardiovascular Management Strategies: Adequate rest, Coping strategies, Diet  modification, Exercise, Routine screening, Weight management, Medical device Do You Have a Working Readable Scale?: Yes Cardiovascular Self-Management Outcome: 4 (good)  Respiratory Respiratory Symptoms Reported: No symptoms reported Other Respiratory Symptoms: uses CPAP nightly for OSA Additional Respiratory Details: continues to report sleeping better, breathing better, and feels CHF remains under control Respiratory Management Strategies: CPAP, Routine screening, Adequate rest, Medication therapy Respiratory Self-Management Outcome: 4 (good)  Endocrine Endocrine Symptoms Reported: No symptoms reported Is patient diabetic?: No    Gastrointestinal Gastrointestinal Symptoms Reported: No symptoms reported Additional Gastrointestinal Details: Wegovy no longer appears effective in assisting w/ weight loss, has not lost any additional weight since initial 30 pounds Gastrointestinal Management Strategies: Diet modification, Exercise, Activity, Medication therapy, Coping strategies Nutrition Risk Screen (CP): No indicators present  Genitourinary Genitourinary Symptoms Reported: No symptoms reported    Integumentary Integumentary Symptoms Reported: No symptoms reported    Musculoskeletal Musculoskelatal Symptoms Reviewed: Difficulty walking, Unsteady gait Additional Musculoskeletal Details: continues to paticipate in water aerobics weekly for exercise Musculoskeletal Management Strategies: Coping strategies Musculoskeletal Self-Management Outcome: 3 (uncertain) Musculoskeletal Comment: uses cane for stability Falls in the past year?: Yes Number of falls in past year: 1 or less Was there an injury with Fall?: No Fall Risk Category Calculator: 1 Patient Fall Risk Level: Low Fall Risk Patient at Risk for Falls Due to: History of fall(s), Impaired balance/gait, Impaired mobility Fall risk Follow up: Falls evaluation completed, Education provided, Falls prevention discussed  Psychosocial  Psychosocial Symptoms Reported: No symptoms reported Behavioral Management Strategies: Medication therapy, Coping strategies, Adequate rest, Support system, Activity Behavioral Health Self-Management Outcome: 4 (good)   Do you feel physically threatened by others?: No      01/02/2024  11:15 AM  Depression screen PHQ 2/9  Decreased Interest 0  Down, Depressed, Hopeless 0  PHQ - 2 Score 0  Altered sleeping 3  Tired, decreased energy 3  Change in appetite 0  Feeling bad or failure about yourself  0  Trouble concentrating 2  Moving slowly or fidgety/restless 1  Suicidal thoughts 0  PHQ-9 Score 9  Difficult doing work/chores Somewhat difficult    There were no vitals filed for this visit.  Medications Reviewed Today     Reviewed by Gordy Channing LABOR, RN (Registered Nurse) on 02/12/24 at 1602  Med List Status: <None>   Medication Order Taking? Sig Documenting Provider Last Dose Status Informant  acetaminophen  (TYLENOL ) 650 MG CR tablet [id number] Yes Take 1,300 mg by mouth every 8 (eight) hours as needed for pain. [provider]  Active Self, Pharmacy Records  albuterol  (VENTOLIN  HFA) [phone] Base) MCG/ACT inhaler [id number] Yes Inhale 2 puffs into the lungs every 6 (six) hours as needed for wheezing or shortness of breath. Kassie Acquanetta Bradley, MD  Active Self, Pharmacy Records  atorvastatin  (LIPITOR) 40 MG tablet [id number] Yes Take 1 tablet (40 mg total) by mouth daily. Emelia Josefa HERO, NP  Active   cyanocobalamin  (VITAMIN B12) 100 MCG tablet [id number] Yes Take 100 mcg by mouth daily. [provider]  Active   diclofenac  Sodium (VOLTAREN ) 1 % GEL [id number] Yes Apply 2 g topically 4 (four) times daily. [provider]  Active   dicyclomine  (BENTYL ) 10 MG capsule [id number] Yes Take 1 capsule (10 mg total) by mouth 4 (four) times daily -  before meals and at bedtime. Allwardt, Mardy HERO, PA-C  Active Self, Pharmacy Records           Med Note (ROBB, MELANIE A    Mon Nov 04, 2023  2:09 PM) Taking twice daily  diphenhydrAMINE  HCl, Sleep, (ZZZQUIL PO) [id number] Yes Take 3 tablets by mouth at bedtime. [provider]  Active Self, Pharmacy Records           Med Note [facility], Theordore Cisnero A   Wed Jan 08, 2024  4:12 PM) Takes 2 capsules as needed   famotidine (PEPCID) 20 MG tablet [id number]  Take by mouth. [provider]  Expired 11/04/23 2359   furosemide  (LASIX ) 80 MG tablet [id number] Yes Take 1 tablet (80 mg total) by mouth daily. Emelia Josefa HERO, NP  Active   potassium chloride  SA (KLOR-CON  M) 20 MEQ tablet [id number] Yes Take 1 tablet (20 mEq total) by mouth daily. Emelia Josefa HERO, NP  Active   valsartan  (DIOVAN ) 40 MG tablet [id number] Yes Take 1 tablet (40 mg total) by mouth daily. 1rst attempt, patient needs an appt for additional refills Krasowski, Robert J, MD  Active   Vitamin D , Ergocalciferol , (DRISDOL ) 1.25 MG (50000 UNIT) CAPS capsule [id number] Yes Take 1 capsule (50,000 Units total) by mouth every 7 (seven) days. Allwardt, Mardy HERO, PA-C  Active   WEGOVY 0.25 MG/0.5ML EMMANUEL [id number] Yes Inject into the skin. [provider]  Active            Med Note CHANETTA, Davide Risdon A   Wed Jan 08, 2024  4:13 PM) Marguerite 1.7mg  weekly            Recommendation:   Specialty provider follow-up 02/17/24 Audiologist Lauraine Luria  Follow Up Plan:   Telephone follow up appointment date/time:  02/26/24 at 3:30 pm.  Channing A. Gordy RN, BA, CHPN, CRRN [place]  [facility]  RN Care Manager Direct Dial: [phone]  Fax: [phone]

## 2024-02-12 NOTE — Patient Instructions (Signed)
 Visit Information  Thank you for taking time to visit with me today. Please don't hesitate to contact me if I can be of assistance to you before our next scheduled telephone appointment.  Our next appointment is by telephone on 02/26/24 at 3:30pm.  Following is a copy of your care plan:   Goals Addressed             This Visit's Progress    VBCI RN Care Plan       Problems:  Chronic Disease Management support and education needs related to CHF and Weight Loss management, patient's current BMI 70.70  Goal: Over the next 4 months the Patient will continue to work with RN Care Manager and/or Social Worker to address care management and care coordination needs related to CHF and Weight Loss management as evidenced by adherence to care management team scheduled appointments      Interventions:   Heart Failure Interventions: Provided education on low sodium diet Reviewed Heart Failure Action Plan in depth and provided written copy Assessed need for readable accurate scales in home Advised patient to weigh each morning after emptying bladder Discussed importance of daily weight and advised patient to weigh and record daily Reviewed role of diuretics in prevention of fluid overload and management of heart failure; Discussed the importance of keeping all appointments with provider Provided patient with education about the role of exercise in the management of heart failure Screening for signs and symptoms of depression related to chronic disease state  Assessed social determinant of health barriers   Weight Loss Interventions: Provided verbal and/or written education to patient re: provider recommended life style modifications  Screening for signs and symptoms of depression Offered to connect patient with psychology or social work support for counseling and supportive care Reviewed recommended dietary changes: avoid fad diets, make small/incremental dietary and exercise changes, eat at  the table and avoid eating in front of the TV, plan management of cravings, monitor snacking and cravings in food diary Patient chose [facility] in [place] for weight loss management and was placed on Wegovy, patient has lost 30 pounds in 6 months, was 392 pounds, now 362 pounds  Patient has appt with [facility] in [place] on 7/10 - not sure if Insurance will continue to cover Wegovy injections, was advised by [place] that patient's insurance may deny continued coverage for this GLP-1 d/t patient's weight loss results being less than expected to be assessed as effective - RNCM follow up in 2 weeks to discuss possible next steps if denied Wegovy and additional options are not offered  Patient Self-Care Activities:  Attend all scheduled provider appointments Perform all self care activities independently  Perform IADL's (shopping, preparing meals, housekeeping, managing finances) independently Take medications as prescribed   Work with the social worker to address care coordination needs and will continue to work with the clinical team to address health care and disease management related needs Work with the pharmacist to address medication management needs and will continue to work with the clinical team to address health care and disease management related needs call office if I gain more than 2 pounds in one day or 5 pounds in one week keep legs up while sitting meet with dietitian track weight in diary use salt in moderation watch for swelling in feet, ankles and legs every day weigh myself daily begin a heart failure diary develop a rescue plan follow rescue plan if symptoms flare-up eat more whole grains,  fruits and vegetables, lean meats and healthy fats track symptoms and what helps feel better or worse  Plan:  The patient has been provided with contact information for the care management team and has been advised to call with any  health related questions or concerns.              Patient verbalizes understanding of instructions and care plan provided today and agrees to view in MyChart. Active MyChart status and patient understanding of how to access instructions and care plan via MyChart confirmed with patient.     The patient has been provided with contact information for the care management team and has been advised to call with any health related questions or concerns.   Please call the care guide team at [phone] if you need to cancel or reschedule your appointment.   Please call 1-800-273-TALK (toll free, 24 hour hotline) if you are experiencing a Mental Health or Behavioral Health Crisis or need someone to talk to.  [place] Ariel A. Gordy RN, BA, [facility], CRRN [place]  [facility] Population Health RN Care Manager Direct Dial: [phone]  Fax: [phone]

## 2024-02-14 DIAGNOSIS — G4736 Sleep related hypoventilation in conditions classified elsewhere: Secondary | ICD-10-CM | POA: Diagnosis not present

## 2024-02-14 DIAGNOSIS — G4733 Obstructive sleep apnea (adult) (pediatric): Secondary | ICD-10-CM | POA: Diagnosis not present

## 2024-02-14 DIAGNOSIS — I5022 Chronic systolic (congestive) heart failure: Secondary | ICD-10-CM | POA: Diagnosis not present

## 2024-02-17 ENCOUNTER — Ambulatory Visit: Attending: Physician Assistant | Admitting: Audiologist

## 2024-02-18 ENCOUNTER — Telehealth: Payer: Self-pay

## 2024-02-18 ENCOUNTER — Other Ambulatory Visit: Payer: Self-pay

## 2024-02-18 NOTE — Patient Instructions (Signed)
 Visit Information  Thank you for taking time to visit with me today. Please don't hesitate to contact me if I can be of assistance to you before our next scheduled appointment.  Your next care management appointment is by telephone on 02/26/24 at 2pm   Please call the care guide team at [phone] if you need to cancel, schedule, or reschedule an appointment.   Please call 911 if you are experiencing a Mental Health or Behavioral Health Crisis or need someone to talk to.  Tillman Gardener, BSW [place]  [facility], [facility] Social Worker Direct Dial: [phone]  Fax: [phone] Website: [email or web address]

## 2024-02-18 NOTE — Patient Outreach (Signed)
 Complex Care Management   Visit Note  02/18/2024  Name:  Christina Dominguez MRN: [id number] DOB: 11/19/1965  Situation: Referral received for Complex Care Management related to SDOH Barriers:  PCS I obtained verbal consent from Patient.  Visit completed with patient  on the phone  Background:   Past Medical History:  Diagnosis Date   Anxiety    Aortic atherosclerosis (HCC) 08/19/2021   Arthritis    Asthma    Bipolar 1 disorder (HCC)    Chronic diastolic CHF (congestive heart failure) (HCC)    Class 3 severe obesity with serious comorbidity and body mass index (BMI) greater than or equal to 70 in adult 12/21/2019   Depression    Diastolic dysfunction 08/02/2021   Diverticulosis 08/19/2021   Edema of both lower extremities    Hepatic steatosis 08/19/2021   Hepatomegaly 08/19/2021   High blood pressure    Hyperlipidemia    Joint pain    Morbid obesity (HCC)    Pre-diabetes     Assessment: Patient has not received and update from [facility] on PCS. SW t/c [facility] and staff does not see the referral in the record.  Staff agreed to connect patient with a [facility] Case Manager that works directly with PCS request.  [employer] CM will contact patient within 3-5 business days.     SDOH Interventions    Flowsheet Row Patient Outreach from 01/29/2024 in [place] POPULATION HEALTH DEPARTMENT Patient Outreach from 01/08/2024 in [place] POPULATION HEALTH DEPARTMENT Care Coordination from 10/23/2023 in [facility] Community Care Coordination Patient Outreach Telephone from 09/02/2023 in [place] HEALTH POPULATION HEALTH DEPARTMENT Patient Outreach Telephone from 07/15/2023 in [place] POPULATION HEALTH DEPARTMENT Telephone from 06/11/2023 in [place] POPULATION HEALTH DEPARTMENT  SDOH Interventions        Food Insecurity Interventions Intervention Not Indicated Intervention Not Indicated Intervention Not Indicated Intervention Not Indicated Intervention Not Indicated Intervention Not Indicated   Housing Interventions Intervention Not Indicated Intervention Not Indicated Intervention Not Indicated Intervention Not Indicated Other (Comment)  [Patient has discussed the issue with the apartment manager] --  Transportation Interventions -- --  candance LCSW's note re intervention] Other (Comment)  [Will contact Medicaid Transportation for visits] Payor Benefit -- Intervention Not Indicated  Utilities Interventions Intervention Not Indicated Intervention Not Indicated Intervention Not Indicated Intervention Not Indicated Patient Declined --  Health Literacy Interventions Intervention Not Indicated -- -- -- -- --      Recommendation:   None  Follow Up Plan:   Telephone follow up appointment date/time:  02/26/24 at 2pm  Tillman Gardener, BSW [place]  [facility], [facility] Social Worker Direct Dial: [phone]  Fax: [phone] Website: [email or web address]

## 2024-02-18 NOTE — Telephone Encounter (Signed)
 Copied from CRM [id number]. Topic: Clinical - Medical Advice >> Feb 18, 2024  3:33 PM [place] M wrote: Reason for CRM: patient asking for [place] Koleen RAMAN, CMA to call her back to discuss getting supplies, please reach out to patient when available.  Pt called need incontinence supplies ordered and [employer] advised pt an order would need to be placed. PCP advised approval of verbal order to submit for patient. Called [facility]

## 2024-02-19 ENCOUNTER — Other Ambulatory Visit: Payer: Self-pay | Admitting: Cardiology

## 2024-02-19 DIAGNOSIS — F319 Bipolar disorder, unspecified: Secondary | ICD-10-CM | POA: Diagnosis not present

## 2024-02-19 DIAGNOSIS — Z9189 Other specified personal risk factors, not elsewhere classified: Secondary | ICD-10-CM | POA: Diagnosis not present

## 2024-02-19 DIAGNOSIS — R7303 Prediabetes: Secondary | ICD-10-CM | POA: Diagnosis not present

## 2024-02-19 DIAGNOSIS — I890 Lymphedema, not elsewhere classified: Secondary | ICD-10-CM | POA: Diagnosis not present

## 2024-02-19 DIAGNOSIS — E785 Hyperlipidemia, unspecified: Secondary | ICD-10-CM | POA: Diagnosis not present

## 2024-02-19 DIAGNOSIS — K760 Fatty (change of) liver, not elsewhere classified: Secondary | ICD-10-CM | POA: Diagnosis not present

## 2024-02-19 DIAGNOSIS — Z6841 Body Mass Index (BMI) 40.0 and over, adult: Secondary | ICD-10-CM | POA: Diagnosis not present

## 2024-02-19 DIAGNOSIS — I2729 Other secondary pulmonary hypertension: Secondary | ICD-10-CM | POA: Diagnosis not present

## 2024-02-19 DIAGNOSIS — I509 Heart failure, unspecified: Secondary | ICD-10-CM | POA: Diagnosis not present

## 2024-02-19 DIAGNOSIS — I10 Essential (primary) hypertension: Secondary | ICD-10-CM | POA: Diagnosis not present

## 2024-02-19 DIAGNOSIS — G4733 Obstructive sleep apnea (adult) (pediatric): Secondary | ICD-10-CM | POA: Diagnosis not present

## 2024-02-26 ENCOUNTER — Other Ambulatory Visit: Payer: Self-pay

## 2024-02-26 NOTE — Patient Instructions (Signed)
 Visit Information  Thank you for taking time to visit with me today. Please don't hesitate to contact me if I can be of assistance to you before our next scheduled appointment.  Your next care management appointment is by telephone on 03/03/24 at 2pm   Please call the care guide team at [phone] if you need to cancel, schedule, or reschedule an appointment.   Please call 911 if you are experiencing a Mental Health or Behavioral Health Crisis or need someone to talk to.

## 2024-02-26 NOTE — Patient Outreach (Signed)
 Complex Care Management   Visit Note  02/26/2024  Name:  Christina Dominguez MRN: [id number] DOB: 11-Nov-1965  Situation: Referral received for Complex Care Management related to PCS I obtained verbal consent from Patient.  Visit completed with patient  on the phone  Background:   Past Medical History:  Diagnosis Date   Anxiety    Aortic atherosclerosis (HCC) 08/19/2021   Arthritis    Asthma    Bipolar 1 disorder (HCC)    Chronic diastolic CHF (congestive heart failure) (HCC)    Class 3 severe obesity with serious comorbidity and body mass index (BMI) greater than or equal to 70 in adult 12/21/2019   Depression    Diastolic dysfunction 08/02/2021   Diverticulosis 08/19/2021   Edema of both lower extremities    Hepatic steatosis 08/19/2021   Hepatomegaly 08/19/2021   High blood pressure    Hyperlipidemia    Joint pain    Morbid obesity (HCC)    Pre-diabetes     Assessment: Patient reports no contact from [facility] Case manager to assist with PCS enrollment.  SW contacted [facility] Managed Medicaid and staff can not locate the request for Case Manager. [facility] staff submitted a new request for a case manager and confirmed a Case Manager is needed to process the PCS request. SW explained that this information was not provided in earlier conversations with staff. [facility] staff [place] agreed to call SW by the end of the day to confirm Case manager is assigned and will request to have an urgent call back.   SDOH Interventions    Flowsheet Row Patient Outreach from 01/29/2024 in [place] POPULATION HEALTH DEPARTMENT Patient Outreach from 01/08/2024 in [place] POPULATION HEALTH DEPARTMENT Care Coordination from 10/23/2023 in [facility] Community Care Coordination Patient Outreach Telephone from 09/02/2023 in [place] [employer] [facility] POPULATION HEALTH DEPARTMENT Patient Outreach Telephone from 07/15/2023 in [place] POPULATION HEALTH DEPARTMENT Telephone from 06/11/2023 in [place] POPULATION HEALTH  DEPARTMENT  SDOH Interventions        Food Insecurity Interventions Intervention Not Indicated Intervention Not Indicated Intervention Not Indicated Intervention Not Indicated Intervention Not Indicated Intervention Not Indicated  Housing Interventions Intervention Not Indicated Intervention Not Indicated Intervention Not Indicated Intervention Not Indicated Other (Comment)  [Patient has discussed the issue with the apartment manager] --  Transportation Interventions -- --  candance LCSW's note re intervention] Other (Comment)  [Will contact Medicaid Transportation for visits] Payor Benefit -- Intervention Not Indicated  Utilities Interventions Intervention Not Indicated Intervention Not Indicated Intervention Not Indicated Intervention Not Indicated Patient Declined --  Health Literacy Interventions Intervention Not Indicated -- -- -- -- --      Recommendation:   None  Follow Up Plan:   Telephone follow up appointment date/time:  03/03/24 at 2pm  Tillman Gardener, BSW [place]  [facility], [facility] Social Worker Direct Dial: [phone]  Fax: [phone] Website: [email or web address]

## 2024-02-27 NOTE — Telephone Encounter (Signed)
 After seeing patient has not read MyChart message, attempted to call patient and no answer and not able to leave a voicemail for patient to gather information. Will try to contact patient again

## 2024-03-02 ENCOUNTER — Other Ambulatory Visit: Payer: Self-pay

## 2024-03-02 DIAGNOSIS — R32 Unspecified urinary incontinence: Secondary | ICD-10-CM | POA: Insufficient documentation

## 2024-03-02 DIAGNOSIS — R194 Change in bowel habit: Secondary | ICD-10-CM

## 2024-03-02 DIAGNOSIS — K580 Irritable bowel syndrome with diarrhea: Secondary | ICD-10-CM

## 2024-03-02 DIAGNOSIS — N39498 Other specified urinary incontinence: Secondary | ICD-10-CM

## 2024-03-02 NOTE — Telephone Encounter (Signed)
 Order placed, office notes, insurance card faxed to [facility] for DME (incontinence supplies) requested. Nothing further needed at this time

## 2024-03-02 NOTE — Telephone Encounter (Signed)
 See patient reply and advise if she is going to need a more recent office visit or VV to discuss and place orders.

## 2024-03-03 ENCOUNTER — Other Ambulatory Visit: Payer: Self-pay

## 2024-03-03 DIAGNOSIS — I10 Essential (primary) hypertension: Secondary | ICD-10-CM | POA: Diagnosis not present

## 2024-03-03 DIAGNOSIS — R32 Unspecified urinary incontinence: Secondary | ICD-10-CM | POA: Diagnosis not present

## 2024-03-03 NOTE — Patient Instructions (Signed)

## 2024-03-03 NOTE — Patient Outreach (Signed)
 Complex Care Management   Visit Note  03/03/2024  Name:  Christina Dominguez MRN: [id number] DOB: 19-Apr-1966  Situation: Referral received for Complex Care Management related to PCS I obtained verbal consent from Patient.  Visit completed with patient  on the phone  Background:   Past Medical History:  Diagnosis Date   Anxiety    Aortic atherosclerosis (HCC) 08/19/2021   Arthritis    Asthma    Bipolar 1 disorder (HCC)    Chronic diastolic CHF (congestive heart failure) (HCC)    Class 3 severe obesity with serious comorbidity and body mass index (BMI) greater than or equal to 70 in adult 12/21/2019   Depression    Diastolic dysfunction 08/02/2021   Diverticulosis 08/19/2021   Edema of both lower extremities    Hepatic steatosis 08/19/2021   Hepatomegaly 08/19/2021   High blood pressure    Hyperlipidemia    Joint pain    Morbid obesity (HCC)    Pre-diabetes     Assessment:  SW contacted [facility] Managed Medicaid and staff can not locate the request for Case Manager.  [facility] staff submitted a new request for a case manager and confirmed a Case Manager is needed to process the PCS request.  SW explained that the last [facility] staff member did not submit the request and SW requested a supervisor.  After a brief hold, the line is disconnected.   Patient has become frustrated with [facility] process and no longer wants to pursue PCS.  Patient is thankful for SW efforts.  SDOH Interventions    Flowsheet Row Patient Outreach from 01/29/2024 in [place] POPULATION HEALTH DEPARTMENT Patient Outreach from 01/08/2024 in [place] POPULATION HEALTH DEPARTMENT Care Coordination from 10/23/2023 in [facility] Community Care Coordination Patient Outreach Telephone from 09/02/2023 in [place] POPULATION HEALTH DEPARTMENT Patient Outreach Telephone from 07/15/2023 in [place] POPULATION HEALTH DEPARTMENT Telephone from 06/11/2023 in [place] POPULATION HEALTH DEPARTMENT  SDOH Interventions         Food Insecurity Interventions Intervention Not Indicated Intervention Not Indicated Intervention Not Indicated Intervention Not Indicated Intervention Not Indicated Intervention Not Indicated  Housing Interventions Intervention Not Indicated Intervention Not Indicated Intervention Not Indicated Intervention Not Indicated Other (Comment)  [Patient has discussed the issue with the apartment manager] --  Transportation Interventions -- --  candance LCSW's note re intervention] Other (Comment)  [Will contact Medicaid Transportation for visits] Payor Benefit -- Intervention Not Indicated  Utilities Interventions Intervention Not Indicated Intervention Not Indicated Intervention Not Indicated Intervention Not Indicated Patient Declined --  Health Literacy Interventions Intervention Not Indicated -- -- -- -- --     Cognitive        Neurological      HEENT        Cardiovascular      Respiratory      Endocrine      Gastrointestinal        Genitourinary      Integumentary      Musculoskeletal          Psychosocial              01/02/2024   11:15 AM  Depression screen PHQ 2/9  Decreased Interest 0  Down, Depressed, Hopeless 0  PHQ - 2 Score 0  Altered sleeping 3  Tired, decreased energy 3  Change in appetite 0  Feeling bad or failure about yourself  0  Trouble concentrating 2  Moving slowly or fidgety/restless 1  Suicidal thoughts 0  PHQ-9  Score 9  Difficult doing work/chores Somewhat difficult    There were no vitals filed for this visit.  Medications Reviewed Today   Medications were not reviewed in this encounter     Recommendation:   none  Follow Up Plan:   Patient has met all care management goals. Care Management case will be closed. Patient has been provided contact information should new needs arise.   Tillman Gardener, BSW [place]  [facility], [facility] Social Worker Direct Dial: [phone]  Fax: [phone] Website:  [email or web address]

## 2024-03-07 DIAGNOSIS — G4733 Obstructive sleep apnea (adult) (pediatric): Secondary | ICD-10-CM | POA: Diagnosis not present

## 2024-03-31 ENCOUNTER — Ambulatory Visit: Admitting: Audiologist

## 2024-04-02 ENCOUNTER — Ambulatory Visit: Admitting: Physician Assistant

## 2024-04-07 DIAGNOSIS — G4733 Obstructive sleep apnea (adult) (pediatric): Secondary | ICD-10-CM | POA: Diagnosis not present

## 2024-04-10 DIAGNOSIS — R32 Unspecified urinary incontinence: Secondary | ICD-10-CM | POA: Diagnosis not present

## 2024-04-10 DIAGNOSIS — I10 Essential (primary) hypertension: Secondary | ICD-10-CM | POA: Diagnosis not present

## 2024-04-12 DIAGNOSIS — M5416 Radiculopathy, lumbar region: Secondary | ICD-10-CM | POA: Diagnosis not present

## 2024-04-13 DIAGNOSIS — F419 Anxiety disorder, unspecified: Secondary | ICD-10-CM | POA: Diagnosis not present

## 2024-04-20 DIAGNOSIS — F419 Anxiety disorder, unspecified: Secondary | ICD-10-CM | POA: Diagnosis not present

## 2024-04-27 DIAGNOSIS — F419 Anxiety disorder, unspecified: Secondary | ICD-10-CM | POA: Diagnosis not present

## 2024-04-28 ENCOUNTER — Ambulatory Visit: Admitting: Physician Assistant

## 2024-05-04 DIAGNOSIS — F419 Anxiety disorder, unspecified: Secondary | ICD-10-CM | POA: Diagnosis not present

## 2024-05-05 ENCOUNTER — Ambulatory Visit: Admitting: Audiologist

## 2024-05-08 ENCOUNTER — Other Ambulatory Visit: Payer: Self-pay

## 2024-05-11 ENCOUNTER — Other Ambulatory Visit: Payer: Self-pay

## 2024-05-11 DIAGNOSIS — F419 Anxiety disorder, unspecified: Secondary | ICD-10-CM | POA: Diagnosis not present

## 2024-05-15 ENCOUNTER — Other Ambulatory Visit: Payer: Self-pay | Admitting: Cardiology

## 2024-05-18 DIAGNOSIS — G4736 Sleep related hypoventilation in conditions classified elsewhere: Secondary | ICD-10-CM | POA: Diagnosis not present

## 2024-05-18 DIAGNOSIS — I5022 Chronic systolic (congestive) heart failure: Secondary | ICD-10-CM | POA: Diagnosis not present

## 2024-05-18 DIAGNOSIS — G4733 Obstructive sleep apnea (adult) (pediatric): Secondary | ICD-10-CM | POA: Diagnosis not present

## 2024-05-18 DIAGNOSIS — F419 Anxiety disorder, unspecified: Secondary | ICD-10-CM | POA: Diagnosis not present

## 2024-05-25 DIAGNOSIS — F419 Anxiety disorder, unspecified: Secondary | ICD-10-CM | POA: Diagnosis not present

## 2024-05-26 ENCOUNTER — Ambulatory Visit: Admitting: Physician Assistant

## 2024-06-01 DIAGNOSIS — F419 Anxiety disorder, unspecified: Secondary | ICD-10-CM | POA: Diagnosis not present

## 2024-06-08 ENCOUNTER — Encounter: Payer: Self-pay | Admitting: Radiology

## 2024-06-15 ENCOUNTER — Ambulatory Visit: Payer: Self-pay

## 2024-06-15 NOTE — Telephone Encounter (Signed)
 Noted and agreed, thank you.

## 2024-06-15 NOTE — Telephone Encounter (Signed)
 FYI pt advised to go to ED per triage

## 2024-06-15 NOTE — Telephone Encounter (Signed)
 FYI Only or Action Required?: FYI only for provider: ED advised.  Patient was last seen in primary care on 01/02/2024 by [place], Mardy HERO, PA-C.  Called Nurse Triage reporting Fatigue.  Symptoms began several days ago.  Interventions attempted: Nothing.  Symptoms are: unchanged.  Triage Disposition: Go to ED Now (or PCP Triage)  Patient/caregiver understands and will follow disposition?: Yes   Copied from CRM [id number]. Topic: Clinical - Red Word Triage >> Jun 15, 2024  9:52 AM Dedra NOVAK wrote: Red Word that prompted transfer to Nurse Triage: Pt having extreme lethargy. Warm transfer to NT. Reason for Disposition  Headache  Answer Assessment - Initial Assessment Questions Advised ED now. Patient reports will go to [facility] ED and will have someone to drive.  Advised 911 if symptoms worsen.  1. SYMPTOM: What is the main symptom you are concerned about? (e.g., weakness, numbness)     Fatigue, having difficulty time finding words, denies weakness/ numbness HA, slowly moving, chills Denies head injury, fever, n/v/d 2. ONSET: When did this start? (e.g., minutes, hours, days; while sleeping)     Few days ago 3. LAST NORMAL: When was the last time you (the patient) were normal (no symptoms)?     3 days ago 4. PATTERN Does this come and go, or has it been constant since it started?  Is it present now?     Difficulty today finding words 5. CARDIAC SYMPTOMS: Have you had any of the following symptoms: chest pain, difficulty breathing, palpitations?     denies 6. NEUROLOGIC SYMPTOMS: Have you had any of the following symptoms: headache, dizziness, vision loss, double vision, changes in speech, unsteady on your feet?     Headache only, denies dizziness, vision problems, slurred speech, unsteadiness 7. OTHER SYMPTOMS: Do you have any other symptoms?     No  Able to stand and walk without difficulties; use walker Able to eat and drink; just low appeitite; Zetbound No  problems with b/b  Protocols used: Neurologic Deficit-A-AH

## 2024-07-01 ENCOUNTER — Inpatient Hospital Stay: Admitting: Internal Medicine

## 2024-07-01 ENCOUNTER — Inpatient Hospital Stay: Admitting: Family Medicine

## 2024-07-07 ENCOUNTER — Other Ambulatory Visit: Payer: Self-pay | Admitting: Physician Assistant

## 2024-07-07 ENCOUNTER — Other Ambulatory Visit: Payer: Self-pay | Admitting: General Practice

## 2024-07-07 NOTE — Telephone Encounter (Signed)
 Please advise on refill for patient and if ok to send

## 2024-07-08 ENCOUNTER — Encounter: Payer: Self-pay | Admitting: Physician Assistant

## 2024-07-09 ENCOUNTER — Other Ambulatory Visit: Payer: Self-pay | Admitting: General Practice

## 2024-07-09 ENCOUNTER — Telehealth: Payer: Self-pay | Admitting: Physician Assistant

## 2024-07-09 NOTE — Telephone Encounter (Signed)
 Unable to reach Patient by phone. Sent MyChart message of appointment change on 07/16/24 to3:00 pm to be seen by PCP.

## 2024-07-13 ENCOUNTER — Other Ambulatory Visit: Payer: Self-pay | Admitting: Cardiology

## 2024-07-16 ENCOUNTER — Inpatient Hospital Stay: Admitting: Family

## 2024-07-16 ENCOUNTER — Inpatient Hospital Stay: Admitting: Physician Assistant

## 2024-07-20 ENCOUNTER — Encounter (HOSPITAL_BASED_OUTPATIENT_CLINIC_OR_DEPARTMENT_OTHER): Payer: Self-pay

## 2024-08-11 ENCOUNTER — Other Ambulatory Visit: Payer: Self-pay

## 2024-08-11 ENCOUNTER — Inpatient Hospital Stay: Payer: Self-pay | Admitting: Physician Assistant

## 2024-08-11 DIAGNOSIS — R2689 Other abnormalities of gait and mobility: Secondary | ICD-10-CM

## 2024-08-11 DIAGNOSIS — I5032 Chronic diastolic (congestive) heart failure: Secondary | ICD-10-CM

## 2024-08-11 DIAGNOSIS — G4733 Obstructive sleep apnea (adult) (pediatric): Secondary | ICD-10-CM

## 2024-08-11 DIAGNOSIS — E66813 Obesity, class 3: Secondary | ICD-10-CM

## 2024-08-11 DIAGNOSIS — F319 Bipolar disorder, unspecified: Secondary | ICD-10-CM

## 2024-08-11 DIAGNOSIS — F32A Depression, unspecified: Secondary | ICD-10-CM

## 2024-08-11 DIAGNOSIS — F419 Anxiety disorder, unspecified: Secondary | ICD-10-CM
